# Patient Record
Sex: Male | Born: 1960 | Race: White | Hispanic: No | Marital: Married
Health system: Midwestern US, Community
[De-identification: ages and names within clinical notes are randomized; demographics above are authoritative.]

## PROBLEM LIST (undated history)

## (undated) DIAGNOSIS — I219 Acute myocardial infarction, unspecified: Secondary | ICD-10-CM

## (undated) DIAGNOSIS — I10 Essential (primary) hypertension: Secondary | ICD-10-CM

## (undated) DIAGNOSIS — I2510 Atherosclerotic heart disease of native coronary artery without angina pectoris: Secondary | ICD-10-CM

## (undated) DIAGNOSIS — E785 Hyperlipidemia, unspecified: Secondary | ICD-10-CM

## (undated) DIAGNOSIS — K219 Gastro-esophageal reflux disease without esophagitis: Secondary | ICD-10-CM

## (undated) DIAGNOSIS — G893 Neoplasm related pain (acute) (chronic): Secondary | ICD-10-CM

## (undated) DIAGNOSIS — G959 Disease of spinal cord, unspecified: Principal | ICD-10-CM

## (undated) DIAGNOSIS — K3189 Other diseases of stomach and duodenum: Secondary | ICD-10-CM

## (undated) DIAGNOSIS — C160 Malignant neoplasm of cardia: Principal | ICD-10-CM

## (undated) DIAGNOSIS — L7634 Postprocedural seroma of skin and subcutaneous tissue following other procedure: Secondary | ICD-10-CM

## (undated) DIAGNOSIS — R1012 Left upper quadrant pain: Secondary | ICD-10-CM

## (undated) HISTORY — DX: Hyperlipidemia, unspecified: E78.5

## (undated) HISTORY — PX: FRACTURE SURGERY: SHX138

## (undated) HISTORY — PX: CARDIAC CATHETERIZATION: SHX172

## (undated) HISTORY — DX: Essential (primary) hypertension: I10

## (undated) HISTORY — DX: Acute myocardial infarction, unspecified: I21.9

## (undated) HISTORY — DX: Atherosclerotic heart disease of native coronary artery without angina pectoris: I25.10

---

## 1982-06-09 HISTORY — PX: VASECTOMY: SHX75

## 2010-01-21 ENCOUNTER — Ambulatory Visit: Payer: Self-pay | Admitting: Cardiovascular Disease

## 2010-01-21 ENCOUNTER — Inpatient Hospital Stay: Payer: Self-pay | Admitting: Internal Medicine

## 2010-01-22 ENCOUNTER — Encounter: Payer: Self-pay | Admitting: Cardiovascular Disease

## 2010-01-23 ENCOUNTER — Encounter: Payer: Self-pay | Admitting: Cardiovascular Disease

## 2010-01-30 ENCOUNTER — Ambulatory Visit: Payer: Self-pay | Admitting: Cardiovascular Disease

## 2010-01-30 DIAGNOSIS — E785 Hyperlipidemia, unspecified: Secondary | ICD-10-CM | POA: Insufficient documentation

## 2010-01-30 DIAGNOSIS — I2510 Atherosclerotic heart disease of native coronary artery without angina pectoris: Secondary | ICD-10-CM | POA: Insufficient documentation

## 2010-03-06 ENCOUNTER — Encounter: Payer: Self-pay | Admitting: Cardiovascular Disease

## 2010-03-25 ENCOUNTER — Encounter: Payer: Self-pay | Admitting: Cardiovascular Disease

## 2010-07-09 NOTE — Letter (Signed)
Summary: [facility] - No-Show Fax  [facility] - No-Show Fax   Imported By: Marylou Mccoy 03/20/2010 10:47:01  _____________________________________________________________________  External Attachment:    Type:   Image     Comment:   External Document

## 2010-07-09 NOTE — Medication Information (Signed)
Summary: Tax adviser   Imported By: Harlon Flor 02/07/2010 11:52:23  _____________________________________________________________________  External Attachment:    Type:   Image     Comment:   External Document

## 2010-07-09 NOTE — Cardiovascular Report (Signed)
[facility]   Imported By: Harlon Flor 02/07/2010 11:46:43  _____________________________________________________________________  External Attachment:    Type:   Image     Comment:   External Document

## 2010-07-09 NOTE — Letter (Signed)
[facility]   Imported By: Harlon Flor 01/28/2010 10:38:07  _____________________________________________________________________  External Attachment:    Type:   Image     Comment:   External Document

## 2010-07-09 NOTE — Cardiovascular Report (Signed)
Summary: Intervention  Intervention   Imported By: Harlon Flor 02/07/2010 11:49:18  _____________________________________________________________________  External Attachment:    Type:   Image     Comment:   External Document

## 2010-07-09 NOTE — Assessment & Plan Note (Signed)
Summary: NP6/AMD   Visit Type:  Initial Consult  CC:  F/U [facility]. Denies chest pain or shortness of breath..  History of Present Illness: Erik Perkins is a very pleasant 50 year old gentleman with a long history of smoking who stopped several weeks ago, significant hyperlipidemia, hypertension who presented to [facility] on August 15 with stuttering worsening chest discomfort, troponin elevation 0.33, who had a cardiac catheterization showing severe 99% distal RCA disease. A stent was placed and he was discharged and presents for followup.  Overall he states that he is doing very well. He denies any further chest pain. He has been walking, riding his bike with no symptoms of shortness of breath. He's been tolerating his medications without any problems. No hypotension, lightheadedness, dizziness.  Cholesterol in the hospital showed total cholesterol greater than 250, LDL 170.  EKG shows normal sinus rhythm with a rate of 68 beats per minute, T-wave abnormality in lead III  Cardiac catheterization showed 99% distal RCA disease, moderate to severe proximal RCA disease estimated at 60-70%, mild diffuse LAD and left circumflex disease, moderate ostial D2 and D3 disease, moderate distal inferior wall hypokinesis on LV gram.  A Xience 3.5 x 12 mm DES stent was placed to his distal RCA.  Preventive Screening-Counseling & Management  Alcohol-Tobacco     Smoking Status: quit  Caffeine-Diet-Exercise     Does Patient Exercise: yes  Current Medications (verified): 1)  Effient 10 Mg Tabs (Prasugrel Hcl) .... Take One Tablet By Mouth Daily 2)  Crestor 40 Mg Tabs (Rosuvastatin Calcium) .... One Tablet At Bedtime 3)  Metoprolol Tartrate 25 Mg Tabs (Metoprolol Tartrate) .... One Tablet Two Times A Day 4)  Aspirin 325 Mg Tabs (Aspirin) .... One Tablet Once Daily 5)  Lisinopril 10 Mg Tabs (Lisinopril) .... One Tablet Once Daily  Allergies (verified): 1)  ! 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  Alcohol Use - no Regular Exercise - yes--3 times every week.  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  Alcohol Use - no Regular Exercise - yes--3 times every week. Smoking Status:  quit Does Patient Exercise:  yes  Review of Systems  The patient denies fever, weight loss, weight gain, vision loss, decreased hearing, hoarseness, chest pain, syncope, dyspnea on exertion, peripheral edema, prolonged cough, abdominal pain, incontinence, muscle weakness, depression, and enlarged lymph nodes.    Vital Signs:  Patient profile:   50 year old male Height:      71 inches Weight:      205 pounds BMI:     28.70 Pulse rate:   71 / minute BP sitting:   117 / 78  (left arm) Cuff size:   regular  Vitals Entered By: Bishop Dublin, CMA (January 30, 2010 3:13 PM)  Physical Exam  General:  Well developed, well nourished, in no acute distress. Head:  normocephalic and atraumatic Neck:  Neck supple, no JVD. No masses, thyromegaly or abnormal cervical nodes. Lungs:  Clear bilaterally to auscultation and percussion. Heart:  Non-displaced PMI, chest non-tender; regular rate  and rhythm, S1, S2 without murmurs, rubs or gallops. Carotid upstroke normal, no bruit.  Pedals normal pulses. No edema, no varicosities. Abdomen:  abdomen soft and non-tender without masses Msk:  Back normal, normal gait. Muscle strength and tone normal. Pulses:   pulses normal in all 4 extremities Extremities:  No clubbing or cyanosis. Neurologic:  Alert and oriented x 3. Skin:  Intact without lesions or rashes. Psych:  Normal affect.   Impression & Recommendations:  Problem # 1:  CAD (ICD-414.00) doing well after his distal RCA stent. He does have significant proximal RCA disease, moderate ostial diagonal disease. We will treat him aggressively and have encouraged watching his diet, weight loss, increased exercise. We will continue effient and aspirin 325 mg daily.  His updated medication list for this problem includes:    Effient 10 Mg Tabs (Prasugrel hcl) .Marland Kitchen... Take one tablet by mouth daily    Metoprolol Tartrate 25 Mg Tabs (Metoprolol tartrate) ..... One tablet two times a day    Aspirin 325 Mg Tabs (Aspirin) ..... One tablet once daily    Lisinopril 10 Mg Tabs (Lisinopril) ..... One tablet once daily  Orders: EKG w/ Interpretation (93000)  Problem # 2:  HYPERLIPIDEMIA-MIXED (ICD-272.4) We will change him from simvastatin 40 mg daily to  Crestor 40 mg daily given his LDL of 171. we will have him come in for a lipid check in 2-3 months Goal LDL is less than 70.  His updated medication list for this problem includes:    Crestor 40 Mg Tabs (Rosuvastatin calcium) ..... One tablet at bedtime  Patient Instructions: 1)  Your physician has recommended you make the following change in your medication:  Stop simvastatin and start taking Crestor 40 mg one tablet everyday at bedtime. 2)  Your physician wants you to follow-up in:  6 months  You will receive a reminder letter in the mail two months in advance. If you don't receive a letter, please call our office to schedule the follow-up appointment. Prescriptions: LISINOPRIL 10 MG TABS (LISINOPRIL) one tablet once daily  #90 x 6   Entered by:   Bishop Dublin, CMA   Authorized by:   Dossie Arbour MD   Signed by:   Bishop Dublin, CMA on 01/30/2010   Method used:   Electronically to        [id number]       [address]       [place], [place]  [address]       Ph: [phone]       Fax: [phone]   RxID:   [id number] METOPROLOL TARTRATE 25 MG TABS (METOPROLOL TARTRATE) one tablet two times a day  #180 x 12   Entered by:   Bishop Dublin, CMA   Authorized by:   Dossie Arbour MD   Signed by:   Bishop Dublin, CMA on 01/30/2010   Method used:   Electronically to        [id number]       [address]       [place], [place]  [address]       Ph: [phone]       Fax: [phone]   RxID:   [id number] EFFIENT 10 MG TABS (PRASUGREL HCL) Take one tablet by mouth daily  #30 x 12   Entered by:   Bishop Dublin, CMA   Authorized by:   Dossie Arbour MD   Signed by:   Bishop Dublin, CMA  on 01/30/2010   Method used:   Electronically to        [id number]       [address]       [place], [place]  [address]       Ph: [phone]       Fax: [phone]   RxID:   [id number] CRESTOR 40 MG TABS (ROSUVASTATIN CALCIUM) one tablet at bedtime  #90 x 3   Entered by:   Bishop Dublin, CMA   Authorized by:   Dossie Arbour MD   Signed by:   Bishop Dublin, CMA on 01/30/2010   Method used:   Electronically to        [id number]       [address]       [place], [place]  [address]       Ph: [phone]       Fax: [phone]   RxID:   [id number]

## 2010-07-09 NOTE — Miscellaneous (Signed)
Summary: Rx for effient and simvastatin  Clinical Lists Changes  Medications: Added new medication of EFFIENT 10 MG TABS (PRASUGREL HCL) Take one tablet by mouth daily - Signed Added new medication of SIMVASTATIN 40 MG TABS (SIMVASTATIN) onet tablet at bedtime - Signed Rx of EFFIENT 10 MG TABS (PRASUGREL HCL) Take one tablet by mouth daily;  #30 x 3;  Signed;  Entered by: Bishop Dublin, CMA;  Authorized by: Dossie Arbour MD;  Method used: Print then Give to Patient Rx of SIMVASTATIN 40 MG TABS (SIMVASTATIN) onet tablet at bedtime;  #30 x 3;  Signed;  Entered by: Bishop Dublin, CMA;  Authorized by: Dossie Arbour MD;  Method used: Print then Give to Patient    Prescriptions: SIMVASTATIN 40 MG TABS (SIMVASTATIN) onet tablet at bedtime  #30 x 3   Entered by:   Bishop Dublin, CMA   Authorized by:   Dossie Arbour MD   Signed by:   Bishop Dublin, CMA on 01/23/2010   Method used:   Print then Give to Patient   RxID:   [id number] EFFIENT 10 MG TABS (PRASUGREL HCL) Take one tablet by mouth daily  #30 x 3   Entered by:   Bishop Dublin, CMA   Authorized by:   Dossie Arbour MD   Signed by:   Bishop Dublin, CMA on 01/23/2010   Method used:   Print then Give to Patient   RxID:   [id number]

## 2010-07-09 NOTE — Letter (Signed)
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 [address]   [place], [place] [address]   Phone: [phone]  Fax: [phone]    01/23/2010  TO: Leodis Sias IT MAY CONCERN   RE: Erik Perkins [address] [id number]   The above named individual is under my medical care and may return to work on: August 22,2011 with no restrictions.   If you have any further questions or need additional information, please call.     Sincerely,      Dossie Arbour, MD

## 2010-07-09 NOTE — Letter (Signed)
Summary: PHI  PHI   Imported By: Harlon Flor 02/07/2010 11:51:51  _____________________________________________________________________  External Attachment:    Type:   Image     Comment:   External Document

## 2010-08-05 ENCOUNTER — Encounter: Payer: Self-pay | Admitting: Cardiovascular Disease

## 2010-08-05 ENCOUNTER — Ambulatory Visit (INDEPENDENT_AMBULATORY_CARE_PROVIDER_SITE_OTHER): Payer: Commercial Managed Care - PPO | Admitting: Cardiovascular Disease

## 2010-08-05 DIAGNOSIS — E785 Hyperlipidemia, unspecified: Secondary | ICD-10-CM

## 2010-08-05 DIAGNOSIS — Z9861 Coronary angioplasty status: Secondary | ICD-10-CM

## 2010-08-05 DIAGNOSIS — I2510 Atherosclerotic heart disease of native coronary artery without angina pectoris: Secondary | ICD-10-CM

## 2010-08-05 DIAGNOSIS — R942 Abnormal results of pulmonary function studies: Secondary | ICD-10-CM

## 2010-08-15 NOTE — Assessment & Plan Note (Signed)
Summary: F/U 6 MONTHS/SAB   Visit Type:  Follow-up Primary Provider:  [facility]   History of Present Illness: Erik Perkins is a very pleasant 50 year old gentleman with a long history of smoking, significant hyperlipidemia, hypertension who presented to [facility] on January 21 2010 with stuttering worsening chest discomfort, troponin elevation 0.33, who had a cardiac catheterization showing severe 99% distal RCA disease. A DES stent was placed . He presents for routine followup.  he states that he is doing very well. He denies any further chest pain. He has been walking, and active with no symptoms of shortness of breath. He's been tolerating his medications without any problems. No hypotension, lightheadedness, dizziness.  Most recent blood work from the [facility] 03/2010 shows total cholesterol 139, LDL 68, HDL 37, triglyceride 160, AST 59, ALT 106 ( this is up from AST 47 and ALT 83 in August of last year)  EKG shows normal sinus rhythm with a rate of 57 beats per minute with no significant ST-T wave changes  Cardiac catheterization showed 99% distal RCA disease, moderate to severe proximal RCA disease estimated at 60-70%, mild diffuse LAD and left circumflex disease, moderate ostial D2 and D3 disease, moderate distal inferior wall hypokinesis on LV gram.  A Xience 3.5 x 12 mm DES stent was placed to his distal RCA.  Current Medications (verified): 1)  Effient 10 Mg Tabs (Prasugrel Hcl) .... Take One Tablet By Mouth Daily 2)  Crestor 40 Mg Tabs (Rosuvastatin Calcium) .... One Tablet At Bedtime 3)  Metoprolol Tartrate 25 Mg Tabs (Metoprolol Tartrate) .... One Tablet Two Times A Day 4)  Aspirin 325 Mg Tabs (Aspirin) .... One Tablet Once Daily 5)  Lisinopril 10 Mg Tabs (Lisinopril) .... One Tablet Once Daily  Allergies (verified): 1)  ! 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  Alcohol Use - no Regular Exercise - yes--3 times every week.  Risk Factors: Exercise: yes (01/30/2010)  Risk Factors: Smoking Status: quit (01/30/2010)  Review of Systems  The patient denies fever, weight loss, weight gain, vision loss, decreased hearing, hoarseness, chest pain, syncope, dyspnea on exertion, peripheral edema, prolonged cough, abdominal pain, incontinence, muscle weakness, depression, and enlarged lymph nodes.    Vital Signs:  Patient profile:   50 year old male Height:      71 inches Weight:      214 pounds BMI:     29.95 Pulse rate:   57 / minute BP sitting:   117 / 81  (left arm) Cuff size:   regular  Vitals Entered By: Bishop Dublin, CMA (August 05, 2010 9:56 AM)  Physical Exam  General:  Well developed, well nourished, in no acute distress. Head:  normocephalic and atraumatic Neck:  Neck supple, no JVD. No masses, thyromegaly or abnormal cervical nodes. Lungs:  Clear bilaterally to auscultation and percussion. Heart:  Non-displaced PMI, chest non-tender; regular rate and rhythm, S1, S2 without murmurs, rubs or gallops. Carotid upstroke normal, no bruit.  Pedals normal pulses. No edema, no varicosities. Abdomen:  abdomen soft and non-tender without masses Msk:  Back normal, normal gait. Muscle strength and tone normal. Pulses:  pulses normal in all  4 extremities Extremities:  No clubbing or cyanosis. Neurologic:  Alert and oriented x 3. Skin:  Intact without lesions or rashes. Psych:  Normal affect.   Impression & Recommendations:  Problem # 1:  CAD (ICD-414.00) no symptoms of angina at this time. Continue aggressive  medical management. No further testing. We will decrease his aspirin from 325 mg daily to 81 mg x2. Continue effient  His updated medication list for this problem includes:    Effient 10 Mg Tabs (Prasugrel hcl) .Marland Kitchen... Take one tablet by mouth daily    Metoprolol Tartrate 25 Mg Tabs (Metoprolol tartrate) ..... One tablet two times a day    Aspirin 81 Mg Tbec (Aspirin) .Marland Kitchen... Take 2 tablet by mouth once daily.    Lisinopril 10 Mg Tabs (Lisinopril) ..... One tablet once daily    Nitrostat 0.4 Mg Subl (Nitroglycerin) .Marland Kitchen... 1 tablet under tongue at onset of chest pain; you may repeat every 5 minutes for up to 3 doses.  Problem # 2:  HYPERLIPIDEMIA-MIXED (ICD-272.4) cholesterol is at goal on his current medication regimen. We will need to closely watch his LFTs. He reports this will be done through the [facility] hospital.  His updated medication list for this problem includes:    Crestor 40 Mg Tabs (Rosuvastatin calcium) ..... One tablet at bedtime  Patient Instructions: 1)  Your physician recommends that you schedule a follow-up appointment in: 1 year 2)  Your physician has recommended you make the following change in your medication: DECREASE Aspirin to 81mg  2 tablets once daily. START Nitrostat 0.4 SL as needed. Prescriptions: NITROSTAT 0.4 MG SUBL (NITROGLYCERIN) 1 tablet under tongue at onset of chest pain; you may repeat every 5 minutes for up to 3 doses.  #25 x 3   Entered by:   Lanny Hurst RN   Authorized by:   Dossie Arbour MD   Signed by:   Lanny Hurst RN on 08/05/2010   Method used:   Electronically to        [id number]       [address]       [place], [place]  [address]       Ph: [phone]       Fax: [phone]   RxID:   [id number]

## 2011-02-17 ENCOUNTER — Other Ambulatory Visit: Payer: Self-pay | Admitting: *Deleted

## 2011-02-17 MED ORDER — ROSUVASTATIN CALCIUM 40 MG PO TABS: 40.0000 mg | ORAL_TABLET | Freq: Every day | ORAL | Status: DC

## 2011-02-24 ENCOUNTER — Telehealth: Payer: Self-pay

## 2011-02-24 MED ORDER — PRASUGREL HCL 10 MG PO TABS: 10.0000 mg | ORAL_TABLET | Freq: Every day | ORAL | Status: DC

## 2011-02-24 NOTE — Telephone Encounter (Signed)
Refill sent for effient 75 mg daily.

## 2011-03-06 ENCOUNTER — Telehealth: Payer: Self-pay

## 2011-03-06 MED ORDER — PRASUGREL HCL 10 MG PO TABS: 10.0000 mg | ORAL_TABLET | Freq: Every day | ORAL | Status: DC

## 2011-03-06 NOTE — Telephone Encounter (Signed)
Requested refill for effient

## 2011-06-16 LAB — METABOLIC PANEL, COMPREHENSIVE
A-G Ratio: 1 — ABNORMAL LOW (ref 1.2–3.5)
ALT (SGPT): 39 U/L (ref 12–65)
AST (SGOT): 21 U/L (ref 15–37)
Albumin: 4 g/dL (ref 3.5–5.0)
Alk. phosphatase: 90 U/L (ref 50–136)
Anion gap: 7 mmol/L (ref 7–16)
BUN: 15 MG/DL (ref 6–23)
Bilirubin, total: 0.5 MG/DL (ref 0.2–1.1)
CO2: 28 MMOL/L (ref 21–32)
Calcium: 8.8 MG/DL (ref 8.3–10.4)
Chloride: 104 MMOL/L (ref 98–107)
Creatinine: 1.2 MG/DL (ref 0.8–1.5)
GFR est AA: >60 mL/min/{1.73_m2} (ref >60)
GFR est non-AA: >60 mL/min/{1.73_m2} (ref >60)
Globulin: 3.9 g/dL — ABNORMAL HIGH (ref 2.3–3.5)
Glucose: 95 MG/DL (ref 65–100)
Potassium: 3.8 MMOL/L (ref 3.5–5.1)
Protein, total: 7.9 g/dL (ref 6.3–8.2)
Sodium: 139 MMOL/L (ref 136–145)

## 2011-06-17 LAB — PTT: aPTT: 28.5 s (ref 25.3–32.9)

## 2011-06-17 LAB — CBC W/O DIFF
HCT: 40.9 % — ABNORMAL LOW (ref 41.1–50.3)
HGB: 14.5 g/dL (ref 13.2–17.1)
MCH: 29.8 PG (ref 26.1–32.9)
MCHC: 35.5 g/dL — ABNORMAL HIGH (ref 31.4–35.0)
MCV: 84.2 FL (ref 79.6–97.8)
MPV: 10.1 FL — ABNORMAL LOW (ref 10.8–14.1)
PLATELET: 269 10*3/uL (ref 150–450)
RBC: 4.86 M/uL (ref 4.23–5.67)
RDW: 13.2 % (ref 11.9–14.6)
WBC: 8.9 10*3/uL (ref 4.3–11.1)

## 2011-06-17 LAB — PROTHROMBIN TIME + INR
INR: 1 (ref 0.9–1.2)
Prothrombin time: 10.7 s (ref 8.6–12.2)

## 2011-08-08 ENCOUNTER — Encounter: Payer: Self-pay | Admitting: Cardiovascular Disease

## 2011-08-08 ENCOUNTER — Ambulatory Visit (INDEPENDENT_AMBULATORY_CARE_PROVIDER_SITE_OTHER): Payer: Commercial Managed Care - PPO | Admitting: Cardiovascular Disease

## 2011-08-08 DIAGNOSIS — I2510 Atherosclerotic heart disease of native coronary artery without angina pectoris: Secondary | ICD-10-CM

## 2011-08-08 DIAGNOSIS — I10 Essential (primary) hypertension: Secondary | ICD-10-CM

## 2011-08-08 DIAGNOSIS — Z87891 Personal history of nicotine dependence: Secondary | ICD-10-CM | POA: Insufficient documentation

## 2011-08-08 DIAGNOSIS — E785 Hyperlipidemia, unspecified: Secondary | ICD-10-CM

## 2011-08-08 DIAGNOSIS — Z955 Presence of coronary angioplasty implant and graft: Secondary | ICD-10-CM

## 2011-08-08 DIAGNOSIS — Z9861 Coronary angioplasty status: Secondary | ICD-10-CM

## 2011-08-08 MED ORDER — CLOPIDOGREL BISULFATE 75 MG PO TABS: 75.0000 mg | ORAL_TABLET | Freq: Every day | ORAL | Status: DC

## 2011-08-08 MED ORDER — ROSUVASTATIN CALCIUM 40 MG PO TABS: 40.0000 mg | ORAL_TABLET | Freq: Every day | ORAL | Status: DC

## 2011-08-08 NOTE — Progress Notes (Signed)
Patient ID: Erik Perkins, male    DOB: 1960/07/12, 51 y.o.   MRN: [id number]  HPI Comments: Erik Perkins is a very pleasant 51 year old gentleman with a long history of smoking, significant hyperlipidemia, hypertension who presented to [facility] on January 21 2010 with stuttering worsening chest discomfort, troponin elevation 0.33, who had a cardiac catheterization showing severe 99% distal RCA disease. A DES stent was placed . He presents for routine followup.   he states that he is doing very well. He denies any further chest pain. He has been walking, and active with no symptoms of shortness of breath. He's been tolerating his medications without any problems. No hypotension, lightheadedness, dizziness.   Most recent blood work from the [facility] 01/2011 shows total cholesterol 120, LDL 60,  LFTs slightly improved   EKG shows normal sinus rhythm with a rate of 55 beats per minute with no significant ST-T wave changes   Cardiac catheterization showed 99% distal RCA disease, moderate to severe proximal RCA disease estimated at 60-70%, mild diffuse LAD and left circumflex disease, moderate ostial D2 and D3 disease, moderate distal inferior wall hypokinesis on LV gram. A Xience 3.5 x 12 mm DES stent was placed to his distal RCA.      Outpatient Encounter Prescriptions as of 08/08/2011  Medication Sig Dispense Refill  . aspirin 325 MG EC tablet Take 325 mg by mouth daily.       Marland Kitchen lisinopril (PRINIVIL,ZESTRIL) 10 MG tablet Take 10 mg by mouth daily.        . metoprolol tartrate (LOPRESSOR) 25 MG tablet Take 25 mg by mouth 2 (two) times daily.        Marland Kitchen OMEPRAZOLE PO Take 40 mg by mouth daily.       . rosuvastatin (CRESTOR) 40 MG tablet Take 1 tablet (40 mg total) by mouth daily.  90 tablet  3  . clopidogrel (PLAVIX) 75 MG tablet Take 1 tablet (75 mg total) by mouth daily.  30 tablet  11    Review of Systems  Constitutional: Negative.   HENT: Negative.   Eyes: Negative.   Respiratory: Negative.     Cardiovascular: Negative.   Gastrointestinal: Negative.   Musculoskeletal: Negative.   Skin: Negative.   Neurological: Negative.   Hematological: Negative.   Psychiatric/Behavioral: Negative.   All other systems reviewed and are negative.    BP 110/76  Pulse 55  Ht 5\' 11"  (1.803 m)  Wt 208 lb (94.348 kg)  BMI 29.01 kg/m2   Physical Exam  Nursing note and vitals reviewed. Constitutional: He is oriented to person, place, and time. He appears well-developed and well-nourished.  HENT:  Head: Normocephalic.  Nose: Nose normal.  Mouth/Throat: Oropharynx is clear and moist.  Eyes: Conjunctivae are normal. Pupils are equal, round, and reactive to light.  Neck: Normal range of motion. Neck supple. No JVD present.  Cardiovascular: Normal rate, regular rhythm, S1 normal, S2 normal, normal heart sounds and intact distal pulses.  Exam reveals no gallop and no friction rub.   No murmur heard. Pulmonary/Chest: Effort normal and breath sounds normal. No respiratory distress. He has no wheezes. He has no rales. He exhibits no tenderness.  Abdominal: Soft. Bowel sounds are normal. He exhibits no distension. There is no tenderness.  Musculoskeletal: Normal range of motion. He exhibits no edema and no tenderness.  Lymphadenopathy:    He has no cervical adenopathy.  Neurological: He is alert and oriented to person, place, and time. Coordination normal.  Skin: Skin  is warm and dry. No rash noted. No erythema.  Psychiatric: He has a normal mood and affect. His behavior is normal. Judgment and thought content normal.           Assessment and Plan

## 2011-08-08 NOTE — Assessment & Plan Note (Signed)
Cholesterol is at goal on the current lipid regimen. No changes to the medications were made.  

## 2011-08-08 NOTE — Assessment & Plan Note (Signed)
Currently with no symptoms of angina. No further workup at this time. Continue current medication regimen. 

## 2011-08-08 NOTE — Assessment & Plan Note (Signed)
He stopped smoking in 11/2009

## 2011-08-08 NOTE — Patient Instructions (Signed)
You are doing well. Please decrease aspirin to 81 mg daily  Stop effient Start plavix 75 mg daily  Please call us if you have new issues that need to be addressed before your next appt.  Your physician wants you to follow-up in: 12 months.  You will receive a reminder letter in the mail two months in advance. If you don't receive a letter, please call our office to schedule the follow-up appointment.

## 2011-08-26 ENCOUNTER — Other Ambulatory Visit: Payer: Self-pay | Admitting: *Deleted

## 2011-08-26 MED ORDER — ROSUVASTATIN CALCIUM 40 MG PO TABS: 40.0000 mg | ORAL_TABLET | Freq: Every day | ORAL | Status: DC

## 2012-01-07 ENCOUNTER — Telehealth: Payer: Self-pay | Admitting: Cardiovascular Disease

## 2012-01-07 ENCOUNTER — Ambulatory Visit (INDEPENDENT_AMBULATORY_CARE_PROVIDER_SITE_OTHER): Admitting: Cardiovascular Disease

## 2012-01-07 ENCOUNTER — Encounter: Payer: Self-pay | Admitting: Cardiovascular Disease

## 2012-01-07 VITALS — BP 108/72 | HR 46 | Ht 71.0 in | Wt 208.5 lb

## 2012-01-07 DIAGNOSIS — Z9861 Coronary angioplasty status: Secondary | ICD-10-CM

## 2012-01-07 DIAGNOSIS — E785 Hyperlipidemia, unspecified: Secondary | ICD-10-CM

## 2012-01-07 DIAGNOSIS — Z955 Presence of coronary angioplasty implant and graft: Secondary | ICD-10-CM

## 2012-01-07 DIAGNOSIS — R079 Chest pain, unspecified: Secondary | ICD-10-CM

## 2012-01-07 NOTE — Assessment & Plan Note (Signed)
Atypical type symptoms. We have suggested given his bradycardia we decreased his metoprolol in half, given symptoms of dizziness, weak had his lisinopril in half. If he does not have any improvement of his symptoms and continues to have dull chest pain at rest, we could order a stress test.

## 2012-01-07 NOTE — Patient Instructions (Addendum)
You are doing well. Please cut the metoprolol in 1/2 twice a daily Also cut the lisinopril in 1/2 and take 5 mg daily  If you continue to have symptoms of dull chest pain, call the office for a stress test  Please call us if you have new issues that need to be addressed before your next appt.  Your physician wants you to follow-up in: 6 months.  You will receive a reminder letter in the mail two months in advance. If you don't receive a letter, please call our office to schedule the follow-up appointment.

## 2012-01-07 NOTE — Assessment & Plan Note (Signed)
Cholesterol is at goal on the current lipid regimen. No changes to the medications were made.  

## 2012-01-07 NOTE — Progress Notes (Signed)
Patient ID: Erik Perkins, male    DOB: May 13, 1961, 51 y.o.   MRN: [id number]  HPI Comments: Erik Perkins is a very pleasant 51 year old gentleman with a long history of smoking, significant hyperlipidemia, hypertension who presented to 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 A DES stent was placed . He presents for routine followup.   He reports that he has had some dull chest pain in the past week or so. Comes and goes, comes on at rest, not with exertion. The previous week he had left arm numbness and mild discomfort, nonexertional. No further episodes of left arm pain. He has been push mowing, maintaining his house, weedeating with no symptoms of chest pain or arm pain. No shortness of breath or lightheadedness or sweating. Some dizziness with standing, some fatigue  Recent blood work shows total cholesterol 120, LDL 60 in August 2012.    EKG shows normal sinus rhythm with a rate of 46 beats per minute with no significant ST-T wave changes   Cardiac catheterization showed 99% distal RCA disease, moderate to severe proximal RCA disease estimated at 60-70%, mild diffuse LAD and left circumflex disease, moderate ostial D2 and D3 disease, moderate distal inferior wall hypokinesis on LV gram. A Xience 3.5 x 12 mm DES stent was placed to his distal RCA.      Outpatient Encounter Prescriptions as of 01/07/2012  Medication Sig Dispense Refill  . aspirin 81 MG tablet Take 81 mg by mouth daily.      . clopidogrel (PLAVIX) 75 MG tablet Take 1 tablet (75 mg total) by mouth daily.  30 tablet  11  . lisinopril (PRINIVIL,ZESTRIL) 10 MG tablet Take 0.5 tablets (5 mg total) by mouth daily.  45 tablet  3  . metoprolol tartrate (LOPRESSOR) 25 MG tablet Take 0.5 tablets (12.5 mg total) by mouth 2 (two) times daily.  90 tablet  3  . OMEPRAZOLE PO Take 40 mg by mouth daily.       .  rosuvastatin (CRESTOR) 40 MG tablet Take 1 tablet (40 mg total) by mouth daily.  90 tablet  3    Review of Systems  Constitutional: Negative.   HENT: Negative.   Eyes: Negative.   Respiratory: Positive for chest tightness.   Cardiovascular: Negative.   Gastrointestinal: Negative.   Musculoskeletal: Negative.        Left arm pain  Skin: Negative.   Neurological: Negative.   Hematological: Negative.   Psychiatric/Behavioral: Negative.   All other systems reviewed and are negative.    BP 108/72  Pulse 46  Ht 5\' 11"  (1.803 m)  Wt 208 lb 8 oz (94.575 kg)  BMI 29.08 kg/m2  Physical Exam  Nursing note and vitals reviewed. Constitutional: He is oriented to person, place, and time. He appears well-developed and well-nourished.  HENT:  Head: Normocephalic.  Nose: Nose normal.  Mouth/Throat: Oropharynx is clear and moist.  Eyes: Conjunctivae are normal. Pupils are equal, round, and reactive to light.  Neck: Normal range of motion. Neck supple. No JVD present.  Cardiovascular: Normal rate, regular rhythm, S1 normal, S2 normal, normal heart sounds and intact distal pulses.  Exam reveals no gallop and no friction rub.   No murmur heard. Pulmonary/Chest: Effort normal and breath sounds normal. No respiratory distress. He has no wheezes. He has no rales. He exhibits no tenderness.  Abdominal: Soft. Bowel sounds are normal. He exhibits no  distension. There is no tenderness.  Musculoskeletal: Normal range of motion. He exhibits no edema and no tenderness.  Lymphadenopathy:    He has no cervical adenopathy.  Neurological: He is alert and oriented to person, place, and time. Coordination normal.  Skin: Skin is warm and dry. No rash noted. No erythema.  Psychiatric: He has a normal mood and affect. His behavior is normal. Judgment and thought content normal.           Assessment and Plan

## 2012-01-07 NOTE — Telephone Encounter (Signed)
Pt wife called stating that he had some CP and weakness in his left arm about a week ago. Wants to know what they should do

## 2012-01-07 NOTE — Telephone Encounter (Signed)
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 He was worked in with Dr. Mariah Milling this am at 1030

## 2012-07-23 ENCOUNTER — Other Ambulatory Visit: Payer: Self-pay | Admitting: Cardiovascular Disease

## 2012-08-10 ENCOUNTER — Other Ambulatory Visit: Payer: Self-pay | Admitting: Cardiovascular Disease

## 2012-08-10 NOTE — Telephone Encounter (Signed)
Refilled Clopidogrel sent to [facility].

## 2012-08-18 ENCOUNTER — Ambulatory Visit: Admitting: Cardiovascular Disease

## 2012-09-08 ENCOUNTER — Ambulatory Visit (INDEPENDENT_AMBULATORY_CARE_PROVIDER_SITE_OTHER): Payer: Commercial Managed Care - PPO | Admitting: Cardiovascular Disease

## 2012-09-08 ENCOUNTER — Encounter: Payer: Self-pay | Admitting: Cardiovascular Disease

## 2012-09-08 VITALS — BP 118/75 | HR 61 | Ht 71.0 in | Wt 218.5 lb

## 2012-09-08 DIAGNOSIS — Z955 Presence of coronary angioplasty implant and graft: Secondary | ICD-10-CM

## 2012-09-08 DIAGNOSIS — Z9861 Coronary angioplasty status: Secondary | ICD-10-CM

## 2012-09-08 DIAGNOSIS — I2510 Atherosclerotic heart disease of native coronary artery without angina pectoris: Secondary | ICD-10-CM

## 2012-09-08 DIAGNOSIS — E785 Hyperlipidemia, unspecified: Secondary | ICD-10-CM

## 2012-09-08 MED ORDER — NITROGLYCERIN 0.4 MG SL SUBL: 0.4000 mg | SUBLINGUAL_TABLET | SUBLINGUAL | Status: DC | PRN

## 2012-09-08 NOTE — Assessment & Plan Note (Signed)
We have recommended he stay on his cholesterol medication.he is scheduled to have lab work at the [facility]. Goal LDL less than 70

## 2012-09-08 NOTE — Assessment & Plan Note (Signed)
Currently with no symptoms of angina. No further workup at this time. Continue current medication regimen. 

## 2012-09-08 NOTE — Progress Notes (Signed)
Patient ID: Erik Perkins, male    DOB: [birth year], 52 y.o.   MRN: [id number]  HPI Comments: Mr. 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 A DES stent was placed . He presents for routine followup.   He denies any recent chest pain. His weight is up 10 pounds or so from last year. He has not been working out as much. Otherwise he feels well. Tolerating medications well.  Of shortness of breath, edema, lightheadedness.  Recent blood work shows total cholesterol 120, LDL 60 in August 2012.    EKG shows normal sinus rhythm with a rate of 61 beats per minute with no significant ST-T wave changes   Cardiac catheterization showed 99% distal RCA disease, moderate to severe proximal RCA disease estimated at 60-70%, mild diffuse LAD and left circumflex disease, moderate ostial D2 and D3 disease, moderate distal inferior wall hypokinesis on LV gram. A Xience 3.5 x 12 mm DES stent was placed to his distal RCA.      Outpatient Encounter Prescriptions as of 09/08/2012  Medication Sig Dispense Refill  . aspirin 81 MG tablet Take 81 mg by mouth daily.      . clopidogrel (PLAVIX) 75 MG tablet TAKE 1 TABLET BY MOUTH EVERY DAY  30 tablet  3  . CRESTOR 40 MG tablet TAKE 1 TABLET BY MOUTH EVERY DAY  90 tablet  3  . lisinopril (PRINIVIL,ZESTRIL) 10 MG tablet Take 0.5 tablets (5 mg total) by mouth daily.  45 tablet  3  . metoprolol tartrate (LOPRESSOR) 25 MG tablet Take 0.5 tablets (12.5 mg total) by mouth 2 (two) times daily.  90 tablet  3  . OMEPRAZOLE PO Take 40 mg by mouth daily.       . nitroGLYCERIN (NITROSTAT) 0.4 MG SL tablet Place 1 tablet (0.4 mg total) under the tongue every 5 (five) minutes as needed for chest pain.  25 tablet  6   Review of  Systems  Constitutional: Negative.   HENT: Negative.   Eyes: Negative.   Cardiovascular: Negative.   Gastrointestinal: Negative.   Musculoskeletal: Negative.   Skin: Negative.   Neurological: Negative.   Psychiatric/Behavioral: Negative.   All other systems reviewed and are negative.   BP 118/75  Pulse 61  Ht 5\' 11"  (1.803 m)  Wt 218 lb 8 oz (99.111 kg)  BMI 30.49 kg/m2  Physical Exam  Nursing note and vitals reviewed. Constitutional: He is oriented to person, place, and time. He appears well-developed and well-nourished.  HENT:  Head: Normocephalic.  Nose: Nose normal.  Mouth/Throat: Oropharynx is clear and moist.  Eyes: Conjunctivae are normal. Pupils are equal, round, and reactive to light.  Neck: Normal range of motion. Neck supple. No JVD present.  Cardiovascular: Normal rate, regular rhythm, S1 normal, S2 normal, normal heart sounds and intact distal pulses.  Exam reveals no gallop and no friction rub.   No murmur heard. Pulmonary/Chest: Effort normal and breath sounds normal. No respiratory distress. He has no wheezes. He has no rales. He exhibits no tenderness.  Abdominal: Soft. Bowel sounds are normal. He exhibits no distension. There is no tenderness.  Musculoskeletal: Normal range of motion. He exhibits no edema and no tenderness.  Lymphadenopathy:    He has no cervical adenopathy.  Neurological: He is alert and  oriented to person, place, and time. Coordination normal.  Skin: Skin is warm and dry. No rash noted. No erythema.  Psychiatric: He has a normal mood and affect. His behavior is normal. Judgment and thought content normal.      Assessment and Plan

## 2012-09-08 NOTE — Patient Instructions (Addendum)
You are doing well. No medication changes were made.  Goal total cholesterol is <150, LDL <70  Please call us if you have new issues that need to be addressed before your next appt.  Your physician wants you to follow-up in: 12 months.  You will receive a reminder letter in the mail two months in advance. If you don't receive a letter, please call our office to schedule the follow-up appointment.

## 2012-09-08 NOTE — Assessment & Plan Note (Signed)
Continue on aspirin and Plavix

## 2012-11-19 ENCOUNTER — Other Ambulatory Visit: Payer: Self-pay | Admitting: *Deleted

## 2012-11-19 MED ORDER — CLOPIDOGREL BISULFATE 75 MG PO TABS: ORAL_TABLET | ORAL | Status: DC

## 2012-11-19 NOTE — Telephone Encounter (Signed)
Refilled [facility] 

## 2013-03-14 ENCOUNTER — Ambulatory Visit (INDEPENDENT_AMBULATORY_CARE_PROVIDER_SITE_OTHER): Payer: Commercial Managed Care - PPO | Admitting: Physician Assistant

## 2013-03-14 ENCOUNTER — Encounter: Payer: Self-pay | Admitting: Physician Assistant

## 2013-03-14 VITALS — BP 114/76 | HR 56 | Temp 98.0°F | Resp 18 | Ht 69.0 in | Wt 213.0 lb

## 2013-03-14 DIAGNOSIS — L259 Unspecified contact dermatitis, unspecified cause: Secondary | ICD-10-CM

## 2013-03-14 DIAGNOSIS — L239 Allergic contact dermatitis, unspecified cause: Secondary | ICD-10-CM

## 2013-03-14 MED ORDER — PREDNISONE 20 MG PO TABS: ORAL_TABLET | ORAL | Status: DC

## 2013-03-14 NOTE — Progress Notes (Signed)
Patient ID: Erik Perkins MRN: [id number], DOB: Jun 08, 1961, 52 y.o. Date of Encounter: 03/14/2013, 12:21 PM    Chief Complaint:  Chief Complaint  Patient presents with  . new pt est care    rash on arm,hip, leg     HPI: 52 y.o. year old white male is here as a new patient to our office today. Says that his wife already comes to this office for her primary care. Says that he will plan to continue to come here for primary care. Says that he currently gets his medical care at the [facility] and also goes to [facility].  Says that this rash first started about 2 or 3 weeks ago. Had linear pink rash on legs and arms. Applied some poison poison ivy spray to the area on the left forearm. Developed diffuse rash on the arm after that. Has continued to have splotches of itchy erythema on arms legs and left upper thigh and left lower abdomen.     Home Meds: See attached medication section for any medications that were entered at today's visit. The computer does not put those onto this list.The following list is a list of meds entered prior to today's visit.   Current Outpatient Prescriptions on File Prior to Visit  Medication Sig Dispense Refill  . aspirin 81 MG tablet Take 81 mg by mouth daily.      . clopidogrel (PLAVIX) 75 MG tablet TAKE 1 TABLET BY MOUTH EVERY DAY  30 tablet  5  . CRESTOR 40 MG tablet TAKE 1 TABLET BY MOUTH EVERY DAY  90 tablet  3  . lisinopril (PRINIVIL,ZESTRIL) 10 MG tablet Take 0.5 tablets (5 mg total) by mouth daily.  45 tablet  3  . metoprolol tartrate (LOPRESSOR) 25 MG tablet Take 0.5 tablets (12.5 mg total) by mouth 2 (two) times daily.  90 tablet  3  . nitroGLYCERIN (NITROSTAT) 0.4 MG SL tablet Place 1 tablet (0.4 mg total) under the tongue every 5 (five) minutes as needed for chest pain.  25 tablet  6  . OMEPRAZOLE PO Take 40 mg by mouth daily.        No current facility-administered medications on file prior to visit.    Allergies:  Allergies  Allergen  Reactions  . Other Rash    Cephadrine      Review of Systems: See HPI for pertinent ROS. All other ROS negative.    Physical Exam: Blood pressure 114/76, pulse 56, temperature 98 F (36.7 C), temperature source Oral, resp. rate 18, height 5\' 9"  (1.753 m), weight 213 lb (96.616 kg)., Body mass index is 31.44 kg/(m^2). General:Well Nourished, well-developed white male .Appears in no acute distress. Lungs: Clear bilaterally to auscultation without wheezes, rales, or rhonchi. Breathing is unlabored. Heart: Regular rhythm. No murmurs, rubs, or gallops. Msk:  Strength and tone normal for age. Extremities/Skin: Warm and dry. Linear erythema on left lower leg. Some linear erythema on the right upper arm. Left arm with diffuse pink erythema over the entire forearm. Left lower abdomen with some splotchy erythema. Neuro: Alert and oriented X 3. Moves all extremities spontaneously. Gait is normal. CNII-XII grossly in tact. Psych:  Responds to questions appropriately with a normal affect.     ASSESSMENT AND PLAN:  52 y.o. year old male with  1. Allergic dermatitis - predniSONE (DELTASONE) 20 MG tablet; Take 3 daily for 2 days, then 2 daily for 2 days, then 1 daily for 2 days.  Dispense: 12 tablet; Refill: 0 Followup  if does not resolve. Cautioned regarding side effects of prednisone. Do not use that type of spray in the future as he is apparently "allergic to this product.  [address] [place], [place], [facility] 03/14/2013 12:21 PM

## 2013-05-04 ENCOUNTER — Encounter: Payer: Self-pay | Admitting: Family Medicine

## 2013-06-04 ENCOUNTER — Other Ambulatory Visit: Payer: Self-pay | Admitting: Cardiovascular Disease

## 2013-06-06 ENCOUNTER — Other Ambulatory Visit: Payer: Self-pay

## 2013-08-03 ENCOUNTER — Other Ambulatory Visit: Payer: Self-pay | Admitting: Cardiovascular Disease

## 2013-11-09 ENCOUNTER — Other Ambulatory Visit: Payer: Self-pay | Admitting: Cardiovascular Disease

## 2013-11-22 ENCOUNTER — Ambulatory Visit (INDEPENDENT_AMBULATORY_CARE_PROVIDER_SITE_OTHER): Payer: Commercial Managed Care - PPO | Admitting: Cardiovascular Disease

## 2013-11-22 ENCOUNTER — Encounter: Payer: Self-pay | Admitting: Cardiovascular Disease

## 2013-11-22 VITALS — BP 120/90 | HR 64 | Ht 71.0 in | Wt 217.2 lb

## 2013-11-22 DIAGNOSIS — E785 Hyperlipidemia, unspecified: Secondary | ICD-10-CM

## 2013-11-22 DIAGNOSIS — I2510 Atherosclerotic heart disease of native coronary artery without angina pectoris: Secondary | ICD-10-CM

## 2013-11-22 DIAGNOSIS — Z9861 Coronary angioplasty status: Secondary | ICD-10-CM

## 2013-11-22 DIAGNOSIS — Z955 Presence of coronary angioplasty implant and graft: Secondary | ICD-10-CM

## 2013-11-22 MED ORDER — METOPROLOL TARTRATE 25 MG PO TABS: 25.0000 mg | ORAL_TABLET | Freq: Two times a day (BID) | ORAL | Status: DC

## 2013-11-22 MED ORDER — LISINOPRIL 10 MG PO TABS: 10.0000 mg | ORAL_TABLET | Freq: Every day | ORAL | Status: DC

## 2013-11-22 NOTE — Assessment & Plan Note (Signed)
Currently with no symptoms of angina. No further workup at this time. Continue current medication regimen. 

## 2013-11-22 NOTE — Progress Notes (Signed)
Patient ID: Erik PigeonRobert Perkins, male    DOB: 07/02/1960, 53 y.o.   MRN: [id number]  HPI Comments: 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 A DES stent was placed . He presents for routine followup.   He denies any recent chest pain. He is trying to watch his diet. doing some exercise. Denies any significant shortness of breath..  He has not been working out as much. Otherwise he feels well. Tolerating medications well.    he ran out of his medications recently . [facility], a new primary care physician  blood work shows total cholesterol 120, LDL 60 in August 2012.    EKG shows normal sinus rhythm with a rate of 64 beats per minute with no significant ST-T wave changes   Cardiac catheterization showed 99% distal RCA disease, moderate to severe proximal RCA disease estimated at 60-70%, mild diffuse LAD and left circumflex disease, moderate ostial D2 and D3 disease, moderate distal inferior wall hypokinesis on LV gram. A Xience 3.5 x 12 mm DES stent was placed to his distal RCA.      Outpatient Encounter Prescriptions as of 11/22/2013  Medication Sig  . aspirin 81 MG tablet Take 81 mg by mouth daily.  . clopidogrel (PLAVIX) 75 MG tablet TAKE 1 TABLET BY MOUTH EVERY DAY  . CRESTOR 40 MG tablet TAKE 1 TABLET BY MOUTH EVERY DAY  . lisinopril (PRINIVIL,ZESTRIL) 10 MG tablet Take 0.5 tablets (5 mg total) by mouth daily.  . metoprolol tartrate (LOPRESSOR) 25 MG tablet Take 0.5 tablets (12.5 mg total) by mouth 2 (two) times daily.  . nitroGLYCERIN (NITROSTAT) 0.4 MG SL tablet Place 1 tablet (0.4 mg total) under the tongue every 5 (five) minutes as needed for chest pain.  Marland Kitchen. OMEPRAZOLE PO Take 40 mg by mouth daily.    . [DISCONTINUED] predniSONE (DELTASONE) 20 MG tablet Take 3 daily for 2 days, then 2 daily for 2 days, then 1 daily for 2 days.     Review of Systems  Constitutional: Negative.   HENT: Negative.   Eyes: Negative.   Respiratory: Negative.   Cardiovascular: Negative.   Gastrointestinal: Negative.   Endocrine: Negative.   Musculoskeletal: Negative.   Skin: Negative.   Allergic/Immunologic: Negative.   Neurological: Negative.   Hematological: Negative.   Psychiatric/Behavioral: Negative.   All other systems reviewed and are negative.  BP 120/90  Pulse 64  Ht 5\' 11"  (1.803 m)  Wt 217 lb 4 oz (98.544 kg)  BMI 30.31 kg/m2  Physical Exam  Nursing note and vitals reviewed. Constitutional: He is oriented to person, place, and time. He appears well-developed and well-nourished.  HENT:  Head: Normocephalic.  Nose: Nose normal.  Mouth/Throat: Oropharynx is clear and moist.  Eyes: Conjunctivae are normal. Pupils are equal, round, and reactive to light.  Neck: Normal range of motion. Neck supple. No JVD present.  Cardiovascular: Normal rate, regular rhythm, S1 normal, S2 normal, normal heart sounds and intact distal pulses.  Exam reveals no gallop and no friction rub.   No murmur heard. Pulmonary/Chest: Effort normal and breath sounds normal. No respiratory distress. He has no wheezes. He has no rales. He exhibits no tenderness.  Abdominal: Soft. Bowel sounds are normal. He exhibits no distension. There is  no tenderness.  Musculoskeletal: Normal range of motion. He exhibits no edema and no tenderness.  Lymphadenopathy:    He has no cervical adenopathy.  Neurological: He is alert and oriented to person, place, and time. Coordination normal.  Skin: Skin is warm and dry. No rash noted. No erythema.  Psychiatric: He has a normal mood and affect. His behavior is normal. Judgment and thought content normal.      Assessment and Plan

## 2013-11-22 NOTE — Assessment & Plan Note (Signed)
Prior stent to his RCA. Currently with no symptoms of angina

## 2013-11-22 NOTE — Patient Instructions (Signed)
You are doing well. No medication changes were made.  Goal total cholesterol <150 Goal LDL <70  Please call us if you have new issues that need to be addressed before your next appt.  Your physician wants you to follow-up in: 12 months.  You will receive a reminder letter in the mail two months in advance. If you don't receive a letter, please call our office to schedule the follow-up appointment.

## 2013-11-22 NOTE — Assessment & Plan Note (Signed)
Cholesterol is at goal on the current lipid regimen. No changes to the medications were made. Followed [facility]

## 2014-01-22 ENCOUNTER — Other Ambulatory Visit: Payer: Self-pay | Admitting: Cardiovascular Disease

## 2014-04-11 ENCOUNTER — Other Ambulatory Visit: Payer: Self-pay | Admitting: *Deleted

## 2014-04-11 MED ORDER — ROSUVASTATIN CALCIUM 40 MG PO TABS: ORAL_TABLET | ORAL | Status: DC

## 2014-05-10 ENCOUNTER — Other Ambulatory Visit: Payer: Self-pay | Admitting: Cardiovascular Disease

## 2014-06-25 ENCOUNTER — Other Ambulatory Visit: Payer: Self-pay | Admitting: Cardiovascular Disease

## 2014-09-26 ENCOUNTER — Other Ambulatory Visit: Payer: Self-pay | Admitting: Cardiovascular Disease

## 2014-10-27 ENCOUNTER — Other Ambulatory Visit: Payer: Self-pay | Admitting: Cardiovascular Disease

## 2014-12-04 ENCOUNTER — Encounter: Payer: Self-pay | Admitting: Cardiovascular Disease

## 2014-12-04 ENCOUNTER — Ambulatory Visit (INDEPENDENT_AMBULATORY_CARE_PROVIDER_SITE_OTHER): Payer: Commercial Managed Care - PPO | Admitting: Cardiovascular Disease

## 2014-12-04 VITALS — BP 106/80 | HR 67 | Ht 71.0 in | Wt 215.5 lb

## 2014-12-04 DIAGNOSIS — E785 Hyperlipidemia, unspecified: Secondary | ICD-10-CM

## 2014-12-04 DIAGNOSIS — I25119 Atherosclerotic heart disease of native coronary artery with unspecified angina pectoris: Secondary | ICD-10-CM | POA: Diagnosis not present

## 2014-12-04 DIAGNOSIS — Z955 Presence of coronary angioplasty implant and graft: Secondary | ICD-10-CM

## 2014-12-04 NOTE — Assessment & Plan Note (Signed)
Currently with no symptoms of angina. No further workup at this time. Continue current medication regimen. 

## 2014-12-04 NOTE — Assessment & Plan Note (Signed)
Cholesterol is at goal on the current lipid regimen. No changes to the medications were made.  

## 2014-12-04 NOTE — Patient Instructions (Signed)
You are doing well. No medication changes were made.  Please call us if you have new issues that need to be addressed before your next appt.  Your physician wants you to follow-up in: 12 months.  You will receive a reminder letter in the mail two months in advance. If you don't receive a letter, please call our office to schedule the follow-up appointment. 

## 2014-12-04 NOTE — Assessment & Plan Note (Signed)
Recommended he stay on his aspirin and Plavix 

## 2014-12-04 NOTE — Progress Notes (Signed)
Patient ID: Erik Perkins, male    DOB: [birth year], 54 y.o.   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 A DES stent was placed . He presents for routine followup of his coronary artery disease    He denies any recent chest pain. He is working with a Systems analyst 3 days per week, does lots of physical activity at home on his property Denies any significant shortness of breath..  Tolerating medications well.    Most recent lab work from the [facility] were reviewed with him showing total cholesterol 131, LDL 60, hemoglobin A1c 5.6. He reports that he was not fasting for this lab work   EKG shows normal sinus rhythm with a rate of 67 beats per minute with no significant ST-T wave changes   Cardiac catheterization showed 99% distal RCA disease, moderate to severe proximal RCA disease estimated at 60-70%, mild diffuse LAD and left circumflex disease, moderate ostial D2 and D3 disease, moderate distal inferior wall hypokinesis on LV gram. A Xience 3.5 x 12 mm DES stent was placed to his distal RCA.     Allergies  Allergen Reactions  . Other Rash    Cephadrine    Current Outpatient Prescriptions on File Prior to Visit  Medication Sig Dispense Refill  . aspirin 81 MG tablet Take 81 mg by mouth daily.    . clopidogrel (PLAVIX) 75 MG tablet TAKE 1 TABLET BY MOUTH EVERY DAY 30 tablet 6  . nitroGLYCERIN (NITROSTAT) 0.4 MG SL tablet Place 1 tablet (0.4 mg total) under the tongue every 5 (five) minutes as needed for chest pain. 25 tablet 6  . OMEPRAZOLE PO Take 40 mg by mouth daily.     . rosuvastatin (CRESTOR) 40 MG tablet TAKE 1 TABLET BY MOUTH EVERY DAY 90 tablet 3   No current facility-administered  medications on file prior to visit.    Past Medical History  Diagnosis Date  . Coronary artery disease   . Hyperlipidemia   . Hypertension   . MI (myocardial infarction)     10/2004, 01/2010    Past Surgical History  Procedure Laterality Date  . Cardiac catheterization    . Fracture surgery Right     wrist  54yo  . Vasectomy  52    Social History  reports that he quit smoking about 4 years ago. He has never used smokeless tobacco. He reports that he drinks about 2.2 oz of alcohol per week. He reports that he does not use illicit drugs.  Family History family history includes Cancer in his father; Coronary artery disease in his sister; Diabetes in his sister; Hyperlipidemia in his father and sister.   Review of Systems  Constitutional: Negative.   Respiratory: Negative.   Cardiovascular: Negative.   Gastrointestinal: Negative.   Musculoskeletal: Negative.   Neurological: Negative.   Hematological: Negative.   Psychiatric/Behavioral: Negative.   All other systems reviewed and are negative.  BP 106/80 mmHg  Pulse 67  Ht  (1.803 m)  Wt 215 lb 8 oz (97.75 kg)  BMI 30.07 kg/m2  Physical Exam  Constitutional: He is oriented to person, place, and time. He appears well-developed and well-nourished.  HENT:  Head: Normocephalic.  Nose: Nose normal.  Mouth/Throat: Oropharynx is clear  and moist.  Eyes: Conjunctivae are normal. Pupils are equal, round, and reactive to light.  Neck: Normal range of motion. Neck supple. No JVD present.  Cardiovascular: Normal rate, regular rhythm, S1 normal, S2 normal, normal heart sounds and intact distal pulses.  Exam reveals no gallop and no friction rub.   No murmur heard. Pulmonary/Chest: Effort normal and breath sounds normal. No respiratory distress. He has no wheezes. He has no rales. He exhibits no tenderness.  Abdominal: Soft. Bowel sounds are normal. He exhibits no distension. There is no tenderness.  Musculoskeletal: Normal  range of motion. He exhibits no edema or tenderness.  Lymphadenopathy:    He has no cervical adenopathy.  Neurological: He is alert and oriented to person, place, and time. Coordination normal.  Skin: Skin is warm and dry. No rash noted. No erythema.  Psychiatric: He has a normal mood and affect. His behavior is normal. Judgment and thought content normal.      Assessment and Plan   Nursing note and vitals reviewed.

## 2015-03-03 ENCOUNTER — Other Ambulatory Visit: Payer: Self-pay | Admitting: Cardiovascular Disease

## 2015-04-11 ENCOUNTER — Other Ambulatory Visit: Payer: Self-pay | Admitting: *Deleted

## 2015-04-11 MED ORDER — ROSUVASTATIN CALCIUM 40 MG PO TABS: ORAL_TABLET | ORAL | Status: DC

## 2015-05-03 ENCOUNTER — Other Ambulatory Visit: Payer: Self-pay | Admitting: Cardiovascular Disease

## 2015-08-01 ENCOUNTER — Other Ambulatory Visit: Payer: Self-pay | Admitting: Cardiovascular Disease

## 2015-09-22 ENCOUNTER — Other Ambulatory Visit: Payer: Self-pay | Admitting: Cardiovascular Disease

## 2015-11-20 ENCOUNTER — Other Ambulatory Visit: Payer: Self-pay | Admitting: Cardiovascular Disease

## 2015-11-25 ENCOUNTER — Other Ambulatory Visit: Payer: Self-pay | Admitting: Cardiovascular Disease

## 2015-12-05 ENCOUNTER — Encounter: Payer: Self-pay | Admitting: Cardiovascular Disease

## 2015-12-05 ENCOUNTER — Ambulatory Visit (INDEPENDENT_AMBULATORY_CARE_PROVIDER_SITE_OTHER): Payer: Commercial Managed Care - PPO | Admitting: Cardiovascular Disease

## 2015-12-05 VITALS — BP 120/80 | HR 61 | Ht 71.0 in | Wt 215.0 lb

## 2015-12-05 DIAGNOSIS — Z720 Tobacco use: Secondary | ICD-10-CM | POA: Diagnosis not present

## 2015-12-05 DIAGNOSIS — I159 Secondary hypertension, unspecified: Secondary | ICD-10-CM

## 2015-12-05 DIAGNOSIS — E785 Hyperlipidemia, unspecified: Secondary | ICD-10-CM | POA: Diagnosis not present

## 2015-12-05 DIAGNOSIS — I25119 Atherosclerotic heart disease of native coronary artery with unspecified angina pectoris: Secondary | ICD-10-CM

## 2015-12-05 DIAGNOSIS — Z87891 Personal history of nicotine dependence: Secondary | ICD-10-CM

## 2015-12-05 MED ORDER — NITROGLYCERIN 0.4 MG SL SUBL: 0.4000 mg | SUBLINGUAL_TABLET | SUBLINGUAL | Status: DC | PRN

## 2015-12-05 NOTE — Patient Instructions (Signed)
Medication Instructions:   No changes   Follow-Up: It was a pleasure seeing you in the office today. Please call us if you have new issues that need to be addressed before your next appt.  [phone]  Your physician wants you to follow-up in: 12 months.  You will receive a reminder letter in the mail two months in advance. If you don't receive a letter, please call our office to schedule the follow-up appointment.  If you need a refill on your cardiac medications before your next appointment, please call your pharmacy.     

## 2015-12-05 NOTE — Progress Notes (Signed)
Patient ID: Erik PigeonRobert Perkins, male   DOB: 01-22-1961, 55 y.o.   MRN: [id number] Cardiology Office Note  Date:  12/05/2015   ID:  Erik Pigeonobert Baynes, DOB 01-22-1961, MRN [id number]  PCP:  Frazier RichardsIXON,MARY BETH, PA-C   Chief Complaint  Patient presents with  . other    12 month follow up. Meds reviewed by the patient verbally. "doing well."     HPI:  Mr. Erik MeekerMiller is a very pleasant 55 year old gentleman with a long history of smoking, significant hyperlipidemia, hypertension who [facility] with stuttering worsening chest discomfort, troponin elevation 0.33, who had a cardiac catheterization showing severe 99% distal RCA disease. A DES stent was placed . He presents for routine followup of his coronary artery disease   He denies any recent chest pain.  Reports that he is reactive, denies any new symptoms Denies any significant shortness of breath. Tolerating his medications Has [facility]  Previous lab work showing total cholesterol 131, LDL 60, hemoglobin A1c 5.6.  Weight is stable  EKG shows normal sinus rhythm with a rate of 61 beats per minute with no significant ST-T wave changes  Cardiac catheterization showed 99% distal RCA disease, moderate to severe proximal RCA disease estimated at 60-70%, mild diffuse LAD and left circumflex disease, moderate ostial D2 and D3 disease, moderate distal inferior wall hypokinesis on LV gram. A Xience 3.5 x 12 mm DES stent was placed to his distal RCA.  PMH:   has a past medical history of Coronary artery disease; Hyperlipidemia; Hypertension; and MI (myocardial infarction) (HCC).  PSH:    Past Surgical History  Procedure Laterality Date  . Cardiac catheterization    . Fracture surgery Right     wrist  55yo  . Vasectomy  1984    Current Outpatient Prescriptions  Medication Sig Dispense Refill  . aspirin 81 MG tablet Take 81 mg by mouth daily.    . clopidogrel (PLAVIX) 75 MG tablet TAKE 1  TABLET BY MOUTH EVERY DAY 30 tablet 6  . lisinopril (PRINIVIL,ZESTRIL) 10 MG tablet TAKE 1 TABLET BY MOUTH DAILY. 30 tablet 6  . metoprolol tartrate (LOPRESSOR) 25 MG tablet TAKE 1 TABLET BY MOUTH 2 TIMES DAILY. 60 tablet 0  . nitroGLYCERIN (NITROSTAT) 0.4 MG SL tablet Place 1 tablet (0.4 mg total) under the tongue every 5 (five) minutes as needed for chest pain. 25 tablet 6  . rosuvastatin (CRESTOR) 40 MG tablet TAKE 1 TABLET BY MOUTH EVERY DAY 90 tablet 2   No current facility-administered medications for this visit.     Allergies:   Other   Social History:  The patient  reports that he quit smoking about 5 years ago. He has never used smokeless tobacco. He reports that he drinks about 2.2 oz of alcohol per week. He reports that he does not use illicit drugs.   Family History:   family history includes Cancer in his father; Coronary artery disease in his sister; Diabetes in his sister; Hyperlipidemia in his father and sister.    Review of Systems: Review of Systems  Constitutional: Negative.   Respiratory: Negative.   Cardiovascular: Negative.   Gastrointestinal: Negative.   Musculoskeletal: Negative.   Neurological: Negative.   Psychiatric/Behavioral: Negative.   All other systems reviewed and are negative.    PHYSICAL EXAM: VS:  BP 120/80 mmHg  Pulse 61  Ht 5\' 11"  (1.803 m)  Wt 215 lb (97.523 kg)  BMI 30.00 kg/m2 , BMI  Body mass index is 30 kg/(m^2). GEN: Well nourished, well developed, in no acute distress HEENT: normal Neck: no JVD, carotid bruits, or masses Cardiac: RRR; no murmurs, rubs, or gallops,no edema  Respiratory:  clear to auscultation bilaterally, normal work of breathing GI: soft, nontender, nondistended, + BS MS: no deformity or atrophy Skin: warm and dry, no rash Neuro:  Strength and sensation are intact Psych: euthymic mood, full affect    Recent Labs: No results found for requested labs within last 365 days.    Lipid Panel No results found  for: CHOL, HDL, LDLCALC, TRIG    Wt Readings from Last 3 Encounters:  12/05/15 215 lb (97.523 kg)  12/04/14 215 lb 8 oz (97.75 kg)  11/22/13 217 lb 4 oz (98.544 kg)       ASSESSMENT AND PLAN:  Atherosclerosis of native coronary artery of native heart with angina pectoris (HCC) - Plan: EKG 12-Lead Currently with no symptoms of angina. No further workup at this time. Continue current medication regimen.  Secondary hypertension, unspecified - Plan: EKG 12-Lead Blood pressure is well controlled on today's visit. No changes made to the medications.  Smoking hx Reports that he stopped smoking many years ago  Hyperlipidemia Cholesterol is at goal on the current lipid regimen. No changes to the medications were made.    Total encounter time more than 15 minutes  Greater than 50% was spent in counseling and coordination of care with the patient   Disposition:   F/U  6 months   Orders Placed This Encounter  Procedures  . EKG 12-Lead     Signed, Dossie Arbourim Rindi Beechy, M.D., Ph.D. 12/05/2015  [facility], [place] [phone]

## 2016-05-06 ENCOUNTER — Other Ambulatory Visit: Payer: Self-pay | Admitting: Cardiovascular Disease

## 2016-08-14 ENCOUNTER — Telehealth: Payer: Self-pay | Admitting: Cardiovascular Disease

## 2016-08-14 ENCOUNTER — Other Ambulatory Visit: Payer: Self-pay | Admitting: *Deleted

## 2016-08-14 MED ORDER — CLOPIDOGREL BISULFATE 75 MG PO TABS: 75.0000 mg | ORAL_TABLET | Freq: Every day | ORAL | 3 refills | Status: DC

## 2016-08-14 MED ORDER — ROSUVASTATIN CALCIUM 40 MG PO TABS: 40.0000 mg | ORAL_TABLET | Freq: Every day | ORAL | 3 refills | Status: DC

## 2016-08-14 NOTE — Telephone Encounter (Signed)
°*  STAT* If patient is at the pharmacy, call can be transferred to refill team.   1. Which medications need to be refilled? (please list name of each medication and dose if known) Plavix 75 mg po daily crestor 40 mg po   2. Which pharmacy/location (including street and city if local pharmacy) [facility]   3. Do they need a 30 day or 90 day supply? 90

## 2016-08-14 NOTE — Telephone Encounter (Signed)
Requested Prescriptions   Signed Prescriptions Disp Refills  . clopidogrel (PLAVIX) 75 MG tablet 90 tablet 3    Sig: Take 1 tablet (75 mg total) by mouth daily.    Authorizing Provider: GOLLAN, TIMOTHY J    Ordering User: LOPEZ, MARINA C  . rosuvastatin (CRESTOR) 40 MG tablet 90 tablet 3    Sig: Take 1 tablet (40 mg total) by mouth daily.    Authorizing Provider: GOLLAN, TIMOTHY J    Ordering User: LOPEZ, MARINA C    

## 2016-08-14 NOTE — Telephone Encounter (Signed)
Requested Prescriptions   Signed Prescriptions Disp Refills  . clopidogrel (PLAVIX) 75 MG tablet 90 tablet 3    Sig: Take 1 tablet (75 mg total) by mouth daily.    Authorizing Provider: Antonieta IbaGOLLAN, TIMOTHY J    Ordering User: Shawnie DapperLOPEZ, Lovina Zuver C  . rosuvastatin (CRESTOR) 40 MG tablet 90 tablet 3    Sig: Take 1 tablet (40 mg total) by mouth daily.    Authorizing Provider: Antonieta IbaGOLLAN, TIMOTHY J    Ordering User: Kendrick FriesLOPEZ, Gadge Hermiz C

## 2016-10-11 ENCOUNTER — Other Ambulatory Visit: Payer: Self-pay | Admitting: Cardiovascular Disease

## 2016-12-02 ENCOUNTER — Ambulatory Visit: Admitting: Cardiovascular Disease

## 2016-12-05 NOTE — Progress Notes (Deleted)
Patient ID: Erik PigeonRobert Perkins, male   DOB: 1961-01-27, 56 y.o.   MRN: [id number] Cardiology Office Note  Date:  12/05/2016   ID:  Erik PigeonRobert Perkins, DOB 1961-01-27, MRN [id number]  PCP:  Dorena Bodoixon, Mary B, PA-C   No chief complaint on file.   HPI:  Mr. Erik MeekerMiller is a very pleasant 56 year old gentleman with a long history of  smoking,  significant hyperlipidemia,  hypertension  [facility] on January 21 2010 with stuttering worsening chest discomfort, troponin elevation 0.33,  cardiac catheterization showing severe 99% distal RCA disease.   DES stent was placed .  He presents for routine followup of his coronary artery disease   He denies any recent chest pain.  Reports that he is reactive, denies any new symptoms Denies any significant shortness of breath. Tolerating his medications Has [facility]  Previous lab work showing total cholesterol 131, LDL 60, hemoglobin A1c 5.6.  Weight is stable  EKG shows normal sinus rhythm with a rate of 61 beats per minute with no significant ST-T wave changes  Cardiac catheterization showed 99% distal RCA disease, moderate to severe proximal RCA disease estimated at 60-70%, mild diffuse LAD and left circumflex disease, moderate ostial D2 and D3 disease, moderate distal inferior wall hypokinesis on LV gram. A Xience 3.5 x 12 mm DES stent was placed to his distal RCA.  PMH:   has a past medical history of Coronary artery disease; Hyperlipidemia; Hypertension; and MI (myocardial infarction).  PSH:    Past Surgical History:  Procedure Laterality Date  . CARDIAC CATHETERIZATION    . FRACTURE SURGERY Right    wrist  56yo  . VASECTOMY  1984    Current Outpatient Prescriptions  Medication Sig Dispense Refill  . aspirin 81 MG tablet Take 81 mg by mouth daily.    . clopidogrel (PLAVIX) 75 MG tablet Take 1 tablet (75 mg total) by mouth daily. 90 tablet 3  . clopidogrel (PLAVIX) 75 MG tablet TAKE 1 TABLET BY MOUTH EVERY DAY 30  tablet 3  . lisinopril (PRINIVIL,ZESTRIL) 10 MG tablet TAKE 1 TABLET BY MOUTH DAILY. 30 tablet 6  . metoprolol tartrate (LOPRESSOR) 25 MG tablet TAKE 1 TABLET BY MOUTH 2 TIMES DAILY. 60 tablet 0  . nitroGLYCERIN (NITROSTAT) 0.4 MG SL tablet Place 1 tablet (0.4 mg total) under the tongue every 5 (five) minutes as needed for chest pain. 25 tablet 6  . rosuvastatin (CRESTOR) 40 MG tablet Take 1 tablet (40 mg total) by mouth daily. 90 tablet 3   No current facility-administered medications for this visit.      Allergies:   Other   Social History:  The patient  reports that he quit smoking about 6 years ago. He has a 25.00 pack-year smoking history. He has never used smokeless tobacco. He reports that he drinks about 2.2 oz of alcohol per week . He reports that he does not use drugs.   Family History:   family history includes Cancer in his father; Coronary artery disease in his sister; Diabetes in his sister; Hyperlipidemia in his father and sister.    Review of Systems: Review of Systems  Constitutional: Negative.   Respiratory: Negative.   Cardiovascular: Negative.   Gastrointestinal: Negative.   Musculoskeletal: Negative.   Neurological: Negative.   Psychiatric/Behavioral: Negative.   All other systems reviewed and are negative.    PHYSICAL EXAM: VS:  There were no vitals taken for this visit. , BMI There is no height or weight  on file to calculate BMI. GEN: Well nourished, well developed, in no acute distress HEENT: normal Neck: no JVD, carotid bruits, or masses Cardiac: RRR; no murmurs, rubs, or gallops,no edema  Respiratory:  clear to auscultation bilaterally, normal work of breathing GI: soft, nontender, nondistended, + BS MS: no deformity or atrophy Skin: warm and dry, no rash Neuro:  Strength and sensation are intact Psych: euthymic mood, full affect    Recent Labs: No results found for requested labs within last 8760 hours.    Lipid Panel No results found for:  CHOL, HDL, LDLCALC, TRIG    Wt Readings from Last 3 Encounters:  12/05/15 215 lb (97.5 kg)  12/04/14 215 lb 8 oz (97.8 kg)  11/22/13 217 lb 4 oz (98.5 kg)       ASSESSMENT AND PLAN:  Atherosclerosis of native coronary artery of native heart with angina pectoris (HCC) - Plan: EKG 12-Lead Currently with no symptoms of angina. No further workup at this time. Continue current medication regimen.  Secondary hypertension, unspecified - Plan: EKG 12-Lead Blood pressure is well controlled on today's visit. No changes made to the medications.  Smoking hx Reports that he stopped smoking many years ago  Hyperlipidemia Cholesterol is at goal on the current lipid regimen. No changes to the medications were made.    Total encounter time more than 15 minutes  Greater than 50% was spent in counseling and coordination of care with the patient   Disposition:   F/U  6 months   No orders of the defined types were placed in this encounter.    Signed, Dossie Arbour, M.D., Ph.D. 12/05/2016  [facility] [place], [place] [phone]

## 2016-12-08 ENCOUNTER — Ambulatory Visit: Payer: Commercial Managed Care - PPO | Admitting: Cardiovascular Disease

## 2016-12-23 NOTE — Telephone Encounter (Signed)
pts wife would like her husband to have a new pt eval. With Dr. Lona KettleBoota only. Pt never had sleep study. Please schedule accordingly.

## 2017-01-05 DIAGNOSIS — I25118 Atherosclerotic heart disease of native coronary artery with other forms of angina pectoris: Secondary | ICD-10-CM | POA: Insufficient documentation

## 2017-01-05 DIAGNOSIS — I10 Essential (primary) hypertension: Secondary | ICD-10-CM | POA: Insufficient documentation

## 2017-01-05 NOTE — Progress Notes (Signed)
Patient ID: Erik Perkins, male   DOB: [birth year], 56 y.o.   MRN: [id number] Cardiology Office Note  Date:  01/06/2017   ID:  Erik Perkins, DOB [birth year], MRN [id number]  PCP:  Dorena Bodoixon, Mary B, PA-C   Chief Complaint  Patient presents with  . other    12 month follow up. Patient states he is doing well. Meds reviewed verbally with patient.     HPI:  [name] is a very pleasant 56 year old gentleman with a long history of  smoking,  hyperlipidemia,  hypertension  [facility] on January 21 2010 with stuttering worsening chest discomfort,  troponin elevation 0.33,  cardiac catheterization showing severe 99% distal RCA disease.  DES stent was placed . He presents for routine followup of his coronary artery disease  He denies any recent chest pain.  Active at baseline No shortness of breath or chest pain on exertion Tolerating his medications [facility] Total cholesterol remains 135 Previous lab work showing hemoglobin A1c 5.6.   Weight is up 6 pounds from prior clinic visit Lots of soda and suite tea  EKG shows normal sinus rhythm with a rate of 68 beats per minute with no significant ST-T wave changes  Other past medical history reviewed Cardiac catheterization showed 99% distal RCA disease, moderate to severe proximal RCA disease estimated at 60-70%, mild diffuse LAD and left circumflex disease, moderate ostial D2 and D3 disease, moderate distal inferior wall hypokinesis on LV gram. A Xience 3.5 x 12 mm DES stent was placed to his distal RCA.  PMH:   has a past medical history of Coronary artery disease; Hyperlipidemia; Hypertension; and MI (myocardial infarction) (HCC).  PSH:    Past Surgical History:  Procedure Laterality Date  . CARDIAC CATHETERIZATION    . FRACTURE SURGERY Right    wrist  56yo  . VASECTOMY  1984    Current Outpatient Prescriptions  Medication Sig Dispense Refill  . aspirin 81 MG tablet Take 81 mg by mouth daily.    .  clopidogrel (PLAVIX) 75 MG tablet Take 1 tablet (75 mg total) by mouth daily. 90 tablet 3  . lisinopril (PRINIVIL,ZESTRIL) 10 MG tablet TAKE 1 TABLET BY MOUTH DAILY. 30 tablet 6  . metoprolol tartrate (LOPRESSOR) 25 MG tablet TAKE 1 TABLET BY MOUTH 2 TIMES DAILY. 60 tablet 0  . nitroGLYCERIN (NITROSTAT) 0.4 MG SL tablet Place 1 tablet (0.4 mg total) under the tongue every 5 (five) minutes as needed for chest pain. 25 tablet 6  . rosuvastatin (CRESTOR) 40 MG tablet Take 1 tablet (40 mg total) by mouth daily. 90 tablet 3   No current facility-administered medications for this visit.      Allergies:   Other   Social History:  The patient  reports that he quit smoking about 7 years ago. He has a 25.00 pack-year smoking history. He has never used smokeless tobacco. He reports that he drinks about 2.2 oz of alcohol per week . He reports that he does not use drugs.   Family History:   family history includes Cancer in his father; Coronary artery disease in his sister; Diabetes in his sister; Hyperlipidemia in his father and sister.    Review of Systems: Review of Systems  Constitutional: Negative.   Respiratory: Negative.   Cardiovascular: Negative.   Gastrointestinal: Negative.   Musculoskeletal: Negative.   Neurological: Negative.   Psychiatric/Behavioral: Negative.   All other systems reviewed and are negative.    PHYSICAL EXAM: VS:  BP  120/80 (BP Location: Left Arm, Patient Position: Sitting, Cuff Size: Normal)   Pulse 68   Ht 5\' 11"  (1.803 m)   Wt 221 lb 4 oz (100.4 kg)   BMI 30.86 kg/m  , BMI Body mass index is 30.86 kg/m. GEN: Well nourished, well developed, in no acute distress  HEENT: normal  Neck: no JVD, carotid bruits, or masses Cardiac: RRR; no murmurs, rubs, or gallops,no edema  Respiratory:  clear to auscultation bilaterally, normal work of breathing GI: soft, nontender, nondistended, + BS MS: no deformity or atrophy  Skin: warm and dry, no rash Neuro:  Strength  and sensation are intact Psych: euthymic mood, full affect    Recent Labs: No results found for requested labs within last 8760 hours.    Lipid Panel No results found for: CHOL, HDL, LDLCALC, TRIG    Wt Readings from Last 3 Encounters:  01/06/17 221 lb 4 oz (100.4 kg)  12/05/15 215 lb (97.5 kg)  12/04/14 215 lb 8 oz (97.8 kg)       ASSESSMENT AND PLAN:  Atherosclerosis of native coronary artery of native heart with angina pectoris (HCC) - Plan: EKG 12-Lead Currently with no symptoms of angina. No further workup at this time. Continue current medication regimen.  Essential hypertension Blood pressure is well controlled on today's visit. No changes made to the medications.  Smoking hx stopped smoking many years ago Denies any shortness of breath symptoms  Hyperlipidemia Cholesterol is at goal on the current lipid regimen. No changes to the medications were made. [facility] to [facility]    Total encounter time more than 15 minutes  Greater than 50% was spent in counseling and coordination of care with the patient   Disposition:   F/U  12 months   Orders Placed This Encounter  Procedures  . EKG 12-Lead     Signed, Dossie Arbourim Moksha Dorgan, M.D., Ph.D. 01/06/2017  [facility], [place] [phone]

## 2017-01-06 ENCOUNTER — Ambulatory Visit (INDEPENDENT_AMBULATORY_CARE_PROVIDER_SITE_OTHER): Payer: Commercial Managed Care - PPO | Admitting: Cardiovascular Disease

## 2017-01-06 ENCOUNTER — Encounter: Payer: Self-pay | Admitting: Cardiovascular Disease

## 2017-01-06 VITALS — BP 120/80 | HR 68 | Ht 71.0 in | Wt 221.2 lb

## 2017-01-06 DIAGNOSIS — E782 Mixed hyperlipidemia: Secondary | ICD-10-CM | POA: Diagnosis not present

## 2017-01-06 DIAGNOSIS — Z87891 Personal history of nicotine dependence: Secondary | ICD-10-CM

## 2017-01-06 DIAGNOSIS — I25118 Atherosclerotic heart disease of native coronary artery with other forms of angina pectoris: Secondary | ICD-10-CM

## 2017-01-06 DIAGNOSIS — I10 Essential (primary) hypertension: Secondary | ICD-10-CM

## 2017-01-06 NOTE — Patient Instructions (Signed)

## 2017-01-13 ENCOUNTER — Telehealth: Payer: Self-pay | Admitting: Cardiovascular Disease

## 2017-01-13 ENCOUNTER — Other Ambulatory Visit: Payer: Self-pay

## 2017-01-13 MED ORDER — LISINOPRIL 10 MG PO TABS: 10.0000 mg | ORAL_TABLET | Freq: Every day | ORAL | 6 refills | Status: DC

## 2017-01-13 MED ORDER — METOPROLOL TARTRATE 25 MG PO TABS: 25.0000 mg | ORAL_TABLET | Freq: Two times a day (BID) | ORAL | 0 refills | Status: DC

## 2017-01-13 NOTE — Telephone Encounter (Signed)
°*  STAT* If patient is at the pharmacy, call can be transferred to refill team.   1. Which medications need to be refilled? (please list name of each medication and dose if known)    Lisinopril 10 mg po daily    Metoprolol 25 mg po BID   2. Which pharmacy/location (including street and city if local pharmacy) [facility]   3. Do they need a 30 day or 90 day supply? 90

## 2017-01-13 NOTE — Telephone Encounter (Signed)
Requested Prescriptions   Signed Prescriptions Disp Refills  . lisinopril (PRINIVIL,ZESTRIL) 10 MG tablet 30 tablet 6    Sig: Take 1 tablet (10 mg total) by mouth daily.    Authorizing Provider: GOLLAN, TIMOTHY J    Ordering User: Jabes Primo N  . metoprolol tartrate (LOPRESSOR) 25 MG tablet 60 tablet 0    Sig: Take 1 tablet (25 mg total) by mouth 2 (two) times daily.    Authorizing Provider: GOLLAN, TIMOTHY J    Ordering User: Stacy Deshler N    

## 2017-01-13 NOTE — Telephone Encounter (Signed)
Requested Prescriptions   Signed Prescriptions Disp Refills  . lisinopril (PRINIVIL,ZESTRIL) 10 MG tablet 30 tablet 6    Sig: Take 1 tablet (10 mg total) by mouth daily.    Authorizing Provider: Antonieta IbaGOLLAN, TIMOTHY J    Ordering User: Margrett RudSLAYTON, BRITTANY N  . metoprolol tartrate (LOPRESSOR) 25 MG tablet 60 tablet 0    Sig: Take 1 tablet (25 mg total) by mouth 2 (two) times daily.    Authorizing Provider: Antonieta IbaGOLLAN, TIMOTHY J    Ordering User: Margrett RudSLAYTON, BRITTANY N

## 2017-01-24 ENCOUNTER — Other Ambulatory Visit: Payer: Self-pay | Admitting: Cardiovascular Disease

## 2017-02-05 ENCOUNTER — Ambulatory Visit: Admit: 2017-02-05 | Discharge: 2017-02-05 | Payer: BLUE CROSS/BLUE SHIELD | Primary: Family Medicine

## 2017-02-05 DIAGNOSIS — R0683 Snoring: Secondary | ICD-10-CM

## 2017-02-05 NOTE — Progress Notes (Signed)
[facility]  [address]  [place], [place] [address]  [phone]    Patient Name:  Hayden Franco  Date of Birth:  1960/12/22      Office Visit 02/05/2017    CHIEF COMPLAINT:    Chief Complaint   Patient presents with   ??? Sleep Problem   ??? New Patient       HISTORY OF PRESENT ILLNESS:      The patient present for evaluation and management of snoring and possible obstructive sleep apnea.    The patient component with his wife who indicated he has an ongoing loud snoring, witnessed apneas and she has recorded him to prove he has these episodes.  There is no history of cataplexy, hypnagogic hallucinations, or sleep paralysis.  In addition, there is no history of frequent leg movements, kicking at night, or an inability to keep the legs still.  He indicated he sleeps approximately 11 PM and 7 AM daily.  Denies any problem falling asleep and staying asleep.  The patient himself denies any significant problem to his sleep but his wife is quite disturbed by his sleep problem and she cannot sleep herself because of that.  He never had any sleep evaluation previously.        Sleepiness Scale:     Sitting and reading 0    Watching TV 2    Sitting inactive in a public place 0    As a passenger in a car for an hour without a break 0    Lying down to rest in the afternoon when circumstances Permits 0    Sitting and talking to someone 0    Sitting quietly after lunch without alcohol 0    In a car, while stopped for a few minutes 0    Total :  2    History reviewed. No pertinent past medical history.      Problem List  Never Reviewed          Codes Class Noted    Snoring ICD-10-CM: R06.83  ICD-9-CM: 786.09  02/05/2017        Witnessed apneic spells ICD-10-CM: R06.81  ICD-9-CM: 786.03  02/05/2017                History reviewed. No pertinent surgical history.    No flowsheet data found.      Social History     Social History   ??? Marital status: MARRIED     Spouse name: N/A    ??? Number of children: N/A   ??? Years of education: N/A     Occupational History   ??? Not on file.     Social History Main Topics   ??? Smoking status: Never Smoker   ??? Smokeless tobacco: Never Used   ??? Alcohol use 0.6 oz/week     1 Cans of beer per week   ??? Drug use: No   ??? Sexual activity: Not on file     Other Topics Concern   ??? Not on file     Social History Narrative   ??? No narrative on file         History reviewed. No pertinent family history.      Not on File      No current outpatient prescriptions on file.     No current facility-administered medications for this visit.            REVIEW OF SYSTEMS:  CONSTITUTIONAL:   There is no history of fever, chills, night sweats, weight loss, weight gain, persistent fatigue, or lethargy/hypersomnolence.   EYES:   Denies problems with eye pain, erythema, blurred vision, or visual field loss.   ENTM:   Denies history of tinnitus, epistaxis, sore throat, hoarseness, or dysphonia.   LYMPH:   Denies swollen glands.   CARDIAC:   No chest pain, pressure, discomfort, palpitations, orthopnea, murmurs, or edema.   GI:   No dysphagia, heartburn reflux, nausea/vomiting, diarrhea, abdominal pain, or bleeding.   GU:   Denies history of dysuria, hematuria, polyuria, or decreased urine output.   MS:   No history of myalgias, arthralgias, bone pain, or muscle cramps.   SKIN:   No history of rashes, jaundice, cyanosis, nodules, or ulcers.   ENDO:   Negative for heat or cold intolerance.  No history of DM.   PSYCH:   Negative for anxiety, depression, insomnia, hallucinations.   NEURO:   There is no history of AMS, persistent headache, decreased level of consciousness, seizures, or motor or sensory deficits.      PHYSICAL EXAM:    Visit Vitals   ??? BP 124/84   ??? Pulse 67   ??? Resp 18   ??? Ht 6\' 4"  (1.93 m)   ??? Wt 263 lb 9.6 oz (119.6 kg)   ??? SpO2 94%   ??? BMI 32.09 kg/m2        GENERAL APPEARANCE:   The patient is normal weight and in no respiratory distress.   HEENT:    PERRL.  Conjunctivae unremarkable.   Nasal mucosa is without epistaxis, exudate, or polyps.  Gums and dentition are unremarkable.  There is moderate oropharyngeal narrowing.  TMs are clear.   NECK/LYMPHATIC:   Symmetrical with no elevation of jugular venous pulsation.  Trachea midline. No thyroid enlargement.  No cervical adenopathy.   LUNGS:   Normal respiratory effort with symmetrical lung expansion.   Breath sounds are diminished in the bases.   HEART:   There is a regular rate and rhythm.  No murmur, rub, or gallop.  There is no edema in the lower extremities.   ABDOMEN:   Soft and non-tender.  No hepatosplenomegaly.  Bowel sounds are normal.     SKIN:   There are no rashes, cyanosis, jaundice, or ecchymosis present.   EXTREMITIES:   The extremities are unremarkable without clubbing, cyanosis, joint inflammation, degenerative, or ischemic change.   MUSCULOSKELETAL:   There is no abnormal tone, muscle atrophy, or abnormal movement present.   NEURO:   The patient is alert and oriented to person, place, and time.  Memory appears intact and mood is normal.  No gross sensorimotor deficits are present.         ASSESSMENT:  (Medical Decision Making)         ICD-10-CM ICD-9-CM    1. Snoring, associated with witnessed apnea and sleep disruption.  Further evaluation with home sleep study is needed at this time   R06.83 786.09 REFERRAL TO SLEEP STUDIES   2. Witnessed apneic spells R06.81 786.03 REFERRAL TO SLEEP STUDIES          PLAN:    Schedule the patient for home sleep study    Sleep hygiene and positional therapy is needed      Appropriate instruction regarding sleep disorders in general are given to the patient along with copy of the sleep hygiene    He will return to the sleep center in 1 month for  further evaluation       Orders Placed This Encounter   ??? REFERRAL TO SLEEP STUDIES        Over 50% of today's office visit was spent in face to face time reviewing  test results, prognosis, importance of compliance, education about disease process, benefits of medications, instructions for management of acute flare-ups, and follow up plans.  Total face to face time spent with patient was 30 minutes.    Alverda Skeans, MD  Electronically signed

## 2017-02-05 NOTE — Patient Instructions (Signed)
Snoring: After Your Visit    Your Care Instructions  Snoring is a noise that you may make while breathing during sleep. You snore when the flow of air from your mouth or nose to your lungs makes the tissues of your throat vibrate while you sleep. This usually is caused by a blockage or narrowing in your nose, mouth, or throat (airway).  Snoring can be soft, loud, raspy, harsh, hoarse, or fluttering. Your bed partner may notice that you sleep with your mouth open and that you are restless while sleeping. If snoring interferes with your or your bed partner's sleep, either or both of you may feel tired during the day.  You may be able to help reduce your snoring by making changes in your activities and in the way you sleep.  Follow-up care is a key part of your treatment and safety. Be sure to make and go to all appointments, and call your doctor if you are having problems. It's also a good idea to know your test results and keep a list of the medicines you take.  How can you care for yourself at home?  ?? Lose weight, if needed. Many people who snore are overweight. Weight loss can help reduce the narrowing of the airway and might reduce or stop snoring.  ?? Limit the use of alcohol and medicines. Drinking a lot of alcohol or taking certain medicines, especially sleeping pills or tranquilizers, before sleep may make snoring worse.  ?? Go to bed at the same time each night, and get plenty of sleep. You may snore more when you have not had enough sleep.  ?? Sleep on your side. Sleeping on your side may stop snoring. Try sewing a pocket in the middle of the back of your pajama top, putting a tennis ball into the pocket, and stitching it closed. This will help keep you from sleeping on your back.  ?? Treat breathing problems. Breathing problems caused by colds or allergies can disturb airflow. This can lead to snoring.  ?? Use a device that helps keep your airway open during sleep. This could  be a device that you put in your mouth. Other examples include strips or disks that you use on your nose.  ?? Do not smoke. Smoking can make snoring worse. If you need help quitting, talk to your doctor about stop-smoking programs and medicines. These can increase your chances of quitting for good.  ?? Raise the head of your bed 4 to 6 inches by putting bricks under the legs of the bed. This may prevent your tongue from falling toward the back of the throat, which can make a blocked or narrow airway worse. Putting pillows under your head will not help.  When should you call for help?  Watch closely for changes in your health, and be sure to contact your doctor if:  ?? You snore, and you feel sleepy during the day.  ?? Your sleeping partner or you notice that you gasp, choke, or stop breathing during sleep.  ?? You do not get better as expected.   Where can you learn more?   Go to http://www.healthwise.net/[facility]  Enter J563 in the search box to learn more about "Snoring: After Your Visit."   ?? 2006-2015 Healthwise, Incorporated. Care instructions adapted under license by [place] (which disclaims liability or warranty for this information). This care instruction is for use with your licensed healthcare professional. If you have questions about a medical condition or this instruction, always   ask your healthcare professional. Healthwise, Incorporated disclaims any warranty or liability for your use of this information.  Content Version: 10.5.422740; Current as of: February 15, 2013            Sleep Studies: About This Test    What is it?  Sleep studies are tests that watch what happens to your body during sleep. These studies usually are done in a sleep lab. Sleep labs are often located in hospitals. But sleep studies also can be done with portable equipment that you use at home.    Why is this test done?  Sleep studies are done to find sleep problems, including:  ?? Sleep apnea or excessive snoring.  ?? Insomnia.   ?? Narcolepsy.  ?? Heart rhythm problems.  ?? Repeated muscle twitching of the feet, arms, or legs while you sleep.  ?? Shift work sleep disorder.  How can you prepare for the test?  ?? You may be asked to keep a sleep diary for 1 to 2 weeks before your sleep study.  ?? Don't take any naps for 2 to 3 days before your test.  ?? You may be asked to avoid food or drinks with caffeine for a day or two before your test.  ?? Take a shower or bath before your test, but don't use sprays, oils, or gels on your hair. Don't wear makeup, fingernail polish, or fake nails.  ?? Pack and take along a small overnight bag with personal items, such as a toothbrush, a comb, favorite pillows or blankets, and a book. You can wear your own nightclothes.    Should I take my medications?  Yes, ,unless specifically instructed by her physician taken medications as usual.  Also bring any medication.  He would need during the night or early in the morning.  The sleep Center, does not provide medication or snacks.  It is important for the sleep gestational to know what medication she was taking this many medications can affect your sleep.      What happens during the test?  ?? In the sleep lab, you will be in a private room, much like a hotel room.  ?? There may be a waiting period. You may read, watch TV, or relax during this time.  If you have a commitment in the morning, be sure to inform the technician so they will be able to make sure you are out early enough in the morning.  Otherwise, you can expect to be discharged between 6:00 - 6:30 in the morning.  ?? Small pads or patches called electrodes will be placed on your head and body with a small amount of glue and tape. These will record things like brain activity, eye movement, oxygen levels, and snoring.  ?? Soft elastic belts will be placed around your chest and belly to measure your breathing.  ?? Your blood oxygen levels will be checked by a small clip (oximeter)  placed either on the tip of your index finger or on your earlobe.  ?? If you have sleep apnea, you may wear a mask that is connected to a continuous positive airway pressure (CPAP) machine.  What else should you know about the test?  ?? It may feel odd to be hooked to the sleep study equipment. The sleep lab technician understands that your sleep may not be the same as it is at home because of the equipment. Try to relax and make yourself as comfortable as possible.  ?? After your sleep   problem has been identified, you may need a second study if your doctor orders treatment such as CPAP.  ?? Portable sleep study equipment is available for a person to do sleep studies at home. This may be a choice for people who have problems sleeping in a sleep lab. But home sleep studies may not give the same results as a sleep lab.  How long does the test take?  ?? You will stay in the sleep lab overnight.   ?? Parking is available in the visitor's lot      Will I be able to get out of bed to use the restroom?  Yes. All you have to do is wave your hands are call out  And the  Technologist will unhook the ponytail of wires from the box and you will be free to use the restroom.      What happens after the test?  ?? You will be able to go home right away.  ?? You can go back to your usual activities right away.  ??   Follow-up care is a key part of your treatment and safety. Be sure to make and go to all appointments, and call your doctor if you are having problems. It's also a good idea to keep a list of the medicines you take. Ask your doctor when you can expect to have your test results.   Where can you learn more?   Go to http://www.healthwise.net/[facility]  Enter C688 in the search box to learn more about "Sleep Studies: About This Test."   ?? 2006-2015 Healthwise, Incorporated. Care instructions adapted under license by [place] (which disclaims liability or warranty for this  information). This care instruction is for use with your licensed healthcare professional. If you have questions about a medical condition or this instruction, always ask your healthcare professional. Healthwise, Incorporated disclaims any warranty or liability for your use of this information.  Content Version: 10.5.422740; Current as of: February 15, 2013    Revisions 01/04/2014          Learning About Sleeping Well  What does sleeping well mean?  Sleeping well means getting enough sleep. How much sleep is enough varies among people.  The number of hours you sleep is not as important as how you feel when you wake up. If you do not feel refreshed, you probably need more sleep. Another sign of not getting enough sleep is feeling tired during the day.  The average total nightly sleep time is 7?? to 8 hours. Healthy adults may need a little more or a little less than this.  Why is getting enough sleep important?  Getting enough quality sleep is a basic part of good health. When your sleep suffers, your mood and your thoughts can suffer too. You may find yourself feeling more grumpy or stressed. Not getting enough sleep also can lead to serious problems, including injury, accidents, anxiety, and depression.  What might cause poor sleeping?  Many things can cause sleep problems, including:  ?? Stress. Stress can be caused by fear about a single event, such as giving a speech. Or you may have ongoing stress, such as worry about work or school.  ?? Depression, anxiety, and other mental or emotional conditions.  ?? Changes in your sleep habits or surroundings. This includes changes that happen where you sleep, such as noise, light, or sleeping in a different bed. It also includes changes in your sleep pattern, such as having jet lag or working a   late shift.  ?? Health problems, such as pain, breathing problems, and restless legs syndrome.  ?? Lack of regular exercise.  How can you help yourself?   Here are some tips that may help you sleep more soundly and wake up feeling more refreshed.  Your sleeping area   ?? Use your bedroom only for sleeping and sex. A bit of light reading may help you fall asleep. But if it doesn't, do your reading elsewhere in the house. Don't watch TV in bed.  ?? Be sure your bed is big enough to stretch out comfortably, especially if you have a sleep partner.  ?? Keep your bedroom quiet, dark, and cool. Use curtains, blinds, or a sleep mask to block out light. To block out noise, use earplugs, soothing music, or a "white noise" machine.  Your evening and bedtime routine   ?? Get regular exercise, but not within 3 to 4 hours before your bedtime.  ?? Create a relaxing bedtime routine. You might want to take a warm shower or bath, listen to soothing music, or drink a cup of noncaffeinated tea.  ?? Go to bed at the same time every night. And get up at the same time every morning, even if you feel tired.  What to avoid   ?? Limit caffeine (coffee, tea, caffeinated sodas) during the day, and don't have any for at least 4 to 6 hours before bedtime.  ?? Don't drink alcohol before bedtime. Alcohol can cause you to wake up more often during the night.  ?? Don't smoke or use tobacco, especially in the evening. Nicotine can keep you awake.  ?? Don't take naps during the day, especially close to bedtime.  ?? Don't lie in bed awake for too long. If you can't fall asleep, or if you wake up in the middle of the night and can't get back to sleep within 15 minutes or so, get out of bed and go to another room until you feel sleepy.  ?? Don't take medicine right before bed that may keep you awake or make you feel hyper or energized. Your doctor can tell you if your medicine may do this and if you can take it earlier in the day.  If you can't sleep   ?? Imagine yourself in a peaceful, pleasant scene. Focus on the details and feelings of being in a place that is relaxing.   ?? Get up and do a quiet or boring activity until you feel sleepy.  ?? Don't drink any liquids after 6 p.m. if you wake up often because you have to go to the bathroom.   Where can you learn more?   Go to http://www.healthwise.net/[facility]  Enter J942 in the search box to learn more about "Learning About Sleeping Well."   ?? 2006-2015 Healthwise, Incorporated. Care instructions adapted under license by [place] (which disclaims liability or warranty for this information). This care instruction is for use with your licensed healthcare professional. If you have questions about a medical condition or this instruction, always ask your healthcare professional. Healthwise, Incorporated disclaims any warranty or liability for your use of this information.  Content Version: 10.5.422740; Current as of: April 22, 2013

## 2017-02-26 DIAGNOSIS — G4733 Obstructive sleep apnea (adult) (pediatric): Secondary | ICD-10-CM

## 2017-02-27 ENCOUNTER — Inpatient Hospital Stay: Admit: 2017-02-27 | Payer: BLUE CROSS/BLUE SHIELD | Primary: Family Medicine

## 2017-03-02 ENCOUNTER — Encounter: Payer: BLUE CROSS/BLUE SHIELD | Primary: Family Medicine

## 2017-03-19 ENCOUNTER — Ambulatory Visit: Payer: BLUE CROSS/BLUE SHIELD | Primary: Family Medicine

## 2017-05-14 ENCOUNTER — Other Ambulatory Visit: Payer: Self-pay | Admitting: Cardiovascular Disease

## 2017-07-17 ENCOUNTER — Other Ambulatory Visit: Payer: Self-pay | Admitting: Cardiovascular Disease

## 2017-07-25 ENCOUNTER — Other Ambulatory Visit: Payer: Self-pay | Admitting: Cardiovascular Disease

## 2018-03-30 ENCOUNTER — Telehealth: Payer: Self-pay | Admitting: Cardiovascular Disease

## 2018-03-30 MED ORDER — ROSUVASTATIN CALCIUM 40 MG PO TABS: 40.0000 mg | ORAL_TABLET | Freq: Every day | ORAL | 1 refills | Status: DC

## 2018-03-30 MED ORDER — LISINOPRIL 10 MG PO TABS: 10.0000 mg | ORAL_TABLET | Freq: Every day | ORAL | 1 refills | Status: DC

## 2018-03-30 MED ORDER — METOPROLOL TARTRATE 25 MG PO TABS: 25.0000 mg | ORAL_TABLET | Freq: Two times a day (BID) | ORAL | 1 refills | Status: DC

## 2018-03-30 MED ORDER — CLOPIDOGREL BISULFATE 75 MG PO TABS: 75.0000 mg | ORAL_TABLET | Freq: Every day | ORAL | 1 refills | Status: DC

## 2018-03-30 NOTE — Telephone Encounter (Signed)
°*  STAT* If patient is at the pharmacy, call can be transferred to refill team.   1. Which medications need to be refilled? (please list name of each medication and dose if known)    Plavix 75 mg po q d   Lisinopril 10 mg po q d   Metoprolol 25 mg po q d    Rosuvastatin 40 mg po q d   2. Which pharmacy/location (including street and city if local pharmacy) is medication to be sent to? [facility] [address]   3. Do they need a 30 day or 90 day supply? 90   [facility]

## 2018-03-30 NOTE — Telephone Encounter (Signed)
Pt last seen 7/18 pt OD for 12 month f/u. Pt scheduled for f/u 05/19/18. Please advise if ok to refill until pt is seen.

## 2018-03-30 NOTE — Telephone Encounter (Signed)
Refilled medications for 2 months worth with message to keep appointment for further refills.

## 2018-04-24 ENCOUNTER — Other Ambulatory Visit: Payer: Self-pay | Admitting: Cardiovascular Disease

## 2018-05-19 ENCOUNTER — Ambulatory Visit: Payer: Commercial Managed Care - PPO | Admitting: Cardiovascular Disease

## 2018-05-30 NOTE — Progress Notes (Deleted)
Patient ID: Erik Perkins, male   DOB: 04/11/1961, 57 y.o.   MRN: [id number] Cardiology Office Note  Date:  12/22/2[name]019   ID:  Erik PigeonRobert Perkins, DOB 04/11/1961, MRN [id number]  PCP:  Dorena Bodoixon, Mary B, PA-C   No chief complaint on file.   HPI:  Mr. Erik MeekerMiller is a very pleasant 57 year old gentleman with a long history of  smoking,  hyperlipidemia,  hypertension  [facility] on January 21 2010 with stuttering worsening chest discomfort,  troponin elevation 0.33,  cardiac catheterization showing severe 99% distal RCA disease.  DES stent was placed . He presents for routine followup of his coronary artery disease  He denies any recent chest pain.  Active at baseline No shortness of breath or chest pain on exertion Tolerating his medications [facility] Total cholesterol remains 135 Previous lab work showing hemoglobin A1c 5.6.   Weight is up 6 pounds from prior clinic visit Lots of soda and suite tea  EKG shows normal sinus rhythm with a rate of 68 beats per minute with no significant ST-T wave changes  Other past medical history reviewed Cardiac catheterization showed 99% distal RCA disease, moderate to severe proximal RCA disease estimated at 60-70%, mild diffuse LAD and left circumflex disease, moderate ostial D2 and D3 disease, moderate distal inferior wall hypokinesis on LV gram. A Xience 3.5 x 12 mm DES stent was placed to his distal RCA.  PMH:   has a past medical history of Coronary artery disease, Hyperlipidemia, Hypertension, and MI (myocardial infarction) (HCC).  PSH:    Past Surgical History:  Procedure Laterality Date  . CARDIAC CATHETERIZATION    . FRACTURE SURGERY Right    wrist  57yo  . VASECTOMY  1984    Current Outpatient Medications  Medication Sig Dispense Refill  . aspirin 81 MG tablet Take 81 mg by mouth daily.    . clopidogrel (PLAVIX) 75 MG tablet Take 1 tablet (75 mg total) by mouth daily. Please keep appointment for further refills.  Thanks! 30 tablet 1  . lisinopril (PRINIVIL,ZESTRIL) 10 MG tablet TAKE 1 TABLET DAILY 90 tablet 0  . metoprolol tartrate (LOPRESSOR) 25 MG tablet TAKE 1 TABLET TWICE A DAY 180 tablet 0  . nitroGLYCERIN (NITROSTAT) 0.4 MG SL tablet Place 1 tablet (0.4 mg total) under the tongue every 5 (five) minutes as needed for chest pain. 25 tablet 6  . rosuvastatin (CRESTOR) 40 MG tablet Take 1 tablet (40 mg total) by mouth daily. Please keep appointment for further refills. 30 tablet 1   No current facility-administered medications for this visit.      Allergies:   Other   Social History:  The patient  reports that he quit smoking about 8 years ago. He has a 25.00 pack-year smoking history. He has never used smokeless tobacco. He reports current alcohol use of about 4.0 standard drinks of alcohol per week. He reports that he does not use drugs.   Family History:   family history includes Cancer in his father; Coronary artery disease in his sister; Diabetes in his sister; Hyperlipidemia in his father and sister.    Review of Systems: Review of Systems  Constitutional: Negative.   Respiratory: Negative.   Cardiovascular: Negative.   Gastrointestinal: Negative.   Musculoskeletal: Negative.   Neurological: Negative.   Psychiatric/Behavioral: Negative.   All other systems reviewed and are negative.    PHYSICAL EXAM: VS:  There were no vitals taken for this visit. , BMI There is no height or  weight on file to calculate BMI. GEN: Well nourished, well developed, in no acute distress  HEENT: normal  Neck: no JVD, carotid bruits, or masses Cardiac: RRR; no murmurs, rubs, or gallops,no edema  Respiratory:  clear to auscultation bilaterally, normal work of breathing GI: soft, nontender, nondistended, + BS MS: no deformity or atrophy  Skin: warm and dry, no rash Neuro:  Strength and sensation are intact Psych: euthymic mood, full affect    Recent Labs: No results found for requested labs within  last 8760 hours.    Lipid Panel No results found for: CHOL, HDL, LDLCALC, TRIG    Wt Readings from Last 3 Encounters:  01/06/17 221 lb 4 oz (100.4 kg)  12/05/15 215 lb (97.5 kg)  12/04/14 215 lb 8 oz (97.8 kg)       ASSESSMENT AND PLAN:  Atherosclerosis of native coronary artery of native heart with angina pectoris (HCC) - Plan: EKG 12-Lead Currently with no symptoms of angina. No further workup at this time. Continue current medication regimen.  Essential hypertension Blood pressure is well controlled on today's visit. No changes made to the medications.  Smoking hx stopped smoking many years ago Denies any shortness of breath symptoms  Hyperlipidemia Cholesterol is at goal on the current lipid regimen. No changes to the medications were made. [facility]    Total encounter time more than 15 minutes  Greater than 50% was spent in counseling and coordination of care with the patient   Disposition:   F/U  12 months   No orders of the defined types were placed in this encounter.    Signed, Dossie Arbourim Hennie Gosa, M.D., Ph.D. 05/30/2018  [facility], [place] [phone]

## 2018-05-31 ENCOUNTER — Ambulatory Visit: Admitting: Cardiovascular Disease

## 2018-06-04 ENCOUNTER — Encounter: Payer: Self-pay | Admitting: Nurse Practitioner

## 2018-06-04 ENCOUNTER — Ambulatory Visit: Admitting: Nurse Practitioner

## 2018-06-04 DIAGNOSIS — R0989 Other specified symptoms and signs involving the circulatory and respiratory systems: Secondary | ICD-10-CM

## 2018-06-04 NOTE — Progress Notes (Deleted)
    Office Visit    Patient Name: Erik Perkins Date of Encounter: 06/04/2018  Primary Care Provider:  [facility] Primary Cardiologist:  Erik Nordmannimothy Gollan, Perkins  Chief Complaint    57 year old male with a history of coronary artery disease status post PCI of the RCA in 2011, hypertension, hyperlipidemia, and remote tobacco abuse, who presents for follow-up of coronary artery disease.  Past Medical History    Past Medical History:  Diagnosis Date  . Coronary artery disease    a. 01/2010 NSTEMI/PCI: Mild diff LDA/LCX dzs, moderate ostial D2/3 dzs. RCA 60-70p, 99d (3.5x12 Xience DES).  . Hyperlipidemia   . Hypertension   . MI (myocardial infarction) (HCC)    10/2004, 01/2010   Past Surgical History:  Procedure Laterality Date  . CARDIAC CATHETERIZATION    . FRACTURE SURGERY Right    wrist  57yo  . VASECTOMY  1984    Allergies  Allergies  Allergen Reactions  . Other Rash    Cephadrine    History of Present Illness    57 year old male with the above past medical history including coronary artery disease, hypertension, hyperlipidemia, and remote tobacco abuse.  He suffered a non-STEMI in August 2011 and underwent catheterization revealing severe distal RCA disease.  This was successfully treated with drug-eluting stent.  He was last seen in clinic in July 2018, at which time he was doing well from a cardiac standpoint.  Home Medications    Prior to Admission medications   Medication Sig Start Date End Date Taking? Authorizing Provider  aspirin 81 MG tablet Take 81 mg by mouth daily.    Provider, Historical, Perkins  clopidogrel (PLAVIX) 75 MG tablet Take 1 tablet (75 mg total) by mouth daily. Please keep appointment for further refills. Thanks! 03/30/18   Erik Perkins  lisinopril (PRINIVIL,ZESTRIL) 10 MG tablet TAKE 1 TABLET DAILY 04/26/18   Erik Perkins  metoprolol tartrate (LOPRESSOR) 25 MG tablet TAKE 1 TABLET TWICE A DAY 04/26/18   Erik Perkins  nitroGLYCERIN (NITROSTAT) 0.4 MG SL tablet Place 1 tablet (0.4 mg total) under the tongue every 5 (five) minutes as needed for chest pain. 12/05/15   Erik Perkins  rosuvastatin (CRESTOR) 40 MG tablet Take 1 tablet (40 mg total) by mouth daily. Please keep appointment for further refills. 03/30/18   Erik Perkins    Review of Systems    ***.  All other systems reviewed and are otherwise negative except as noted above.  Physical Exam    VS:  There were no vitals taken for this visit. , BMI There is no height or weight on file to calculate BMI. GEN: Well nourished, well developed, in no acute distress. HEENT: normal. Neck: Supple, no JVD, carotid bruits, or masses. Cardiac: RRR, no murmurs, rubs, or gallops. No clubbing, cyanosis, edema.  Radials/DP/PT 2+ and equal bilaterally.  Respiratory:  Respirations regular and unlabored, clear to auscultation bilaterally. GI: Soft, nontender, nondistended, BS + x 4. MS: no deformity or atrophy. Skin: warm and dry, no rash. Neuro:  Strength and sensation are intact. Psych: Normal affect.  Accessory Clinical Findings    ECG personally reviewed by me today - *** - no acute changes.  Assessment & Plan    1.  ***   Erik Duckinghristopher Bridget Westbrooks, NP 06/04/2018, 1:18 PM

## 2018-06-07 ENCOUNTER — Encounter: Payer: Self-pay | Admitting: Nurse Practitioner

## 2018-06-08 ENCOUNTER — Telehealth: Payer: Self-pay | Admitting: Cardiovascular Disease

## 2018-06-08 NOTE — Telephone Encounter (Signed)
[facility] calling States that [place] some information Will need a release signed and faxed to [phone] for records

## 2018-06-08 NOTE — Telephone Encounter (Signed)
Patient has cancelled upcoming appointment.  Will keep this number on file for the next time he schedules.

## 2018-06-16 ENCOUNTER — Ambulatory Visit: Admitting: Nurse Practitioner

## 2018-07-12 ENCOUNTER — Other Ambulatory Visit: Payer: Self-pay | Admitting: Cardiovascular Disease

## 2018-07-12 NOTE — Telephone Encounter (Signed)
Patient will need an appointment for further refills.  

## 2018-07-13 ENCOUNTER — Telehealth: Payer: Self-pay

## 2018-07-13 NOTE — Telephone Encounter (Signed)
-----   Message from Margrett Rud, [place] sent at 07/12/2018  8:52 AM EST ----- Regarding: Appointment for refills Patient needs an appointment for further refills. If patient does not want to schedule an appointment please make them aware to contact PCP for refills. I have sent in enough medication until appointment can be made.   Thanks Ladies!

## 2018-07-25 ENCOUNTER — Other Ambulatory Visit: Payer: Self-pay | Admitting: Cardiovascular Disease

## 2018-08-25 ENCOUNTER — Other Ambulatory Visit: Payer: Self-pay

## 2018-08-25 ENCOUNTER — Ambulatory Visit (INDEPENDENT_AMBULATORY_CARE_PROVIDER_SITE_OTHER): Admitting: Cardiovascular Disease

## 2018-08-25 ENCOUNTER — Encounter: Payer: Self-pay | Admitting: Cardiovascular Disease

## 2018-08-25 ENCOUNTER — Telehealth: Payer: Self-pay | Admitting: Cardiovascular Disease

## 2018-08-25 VITALS — BP 130/74 | HR 62 | Ht 70.0 in | Wt 215.8 lb

## 2018-08-25 DIAGNOSIS — I25118 Atherosclerotic heart disease of native coronary artery with other forms of angina pectoris: Secondary | ICD-10-CM

## 2018-08-25 MED ORDER — LISINOPRIL 5 MG PO TABS: 5.0000 mg | ORAL_TABLET | Freq: Every day | ORAL | 3 refills | Status: DC

## 2018-08-25 MED ORDER — FUROSEMIDE 20 MG PO TABS: 20.0000 mg | ORAL_TABLET | Freq: Every day | ORAL | 3 refills | Status: DC | PRN

## 2018-08-25 MED ORDER — POTASSIUM CHLORIDE CRYS ER 20 MEQ PO TBCR: 20.0000 meq | EXTENDED_RELEASE_TABLET | Freq: Every day | ORAL | 3 refills | Status: DC | PRN

## 2018-08-25 NOTE — Progress Notes (Signed)
Cardiology Office Note  Date:  08/25/2018   ID:  Erik Perkins, DOB February 02, 1961, MRN [id number]  PCP:  [facility]   Chief Complaint  Patient presents with  . Other    Past due 12 month follow up. Patietn c/o swelling in both legs starting about a month ago.  Patient denies chest pain and SOB. Meds reviewed verbally with patient.     HPI:  Erik Perkins is a very pleasant 58 year old gentleman with a long history of  smoking,  hyperlipidemia,  hypertension  [facility] on January 21 2010 with stuttering worsening chest discomfort,  troponin elevation 0.33,  cardiac catheterization showing severe 99% distal RCA disease.  DES stent was placed . He presents for routine followup of his coronary artery disease  And new onset leg swelling  INTERVAL HISTORY: The patient reports today for follow up.  Patient reports leg swelling that started about a month ago at work while doing normal activity. He works as Librarian, academic. He stopped wearing tall compression socks because these were causing a band from compression in the middle of his calf, made it look like the swelling was worse.    he also has tingling in his feet. He reports no recent change to fluid or sodium intake and denies having DM. Takes Flomax BID. He quit smoking prior to his last MI. He denies any other complications or sick contacts at this time.   Today's Blood pressure 130/74 Prior lab work Total Chol 131/ LDL 60 No new lab work available from the [facility]  EKG personally reviewed by myself on today's visit Shows normal sinus rhythm. 62 bpm.    OTHER PAST MEDICAL HISTORY REVIEWED BY ME FOR TODAY'S VISIT:  Labwork provided from the [facility] on his last clinic visit Total cholesterol remains 135 hemoglobin A1c 5.6.   Cardiac catheterization showed 99% distal RCA disease, moderate to severe proximal RCA disease estimated at 60-70%, mild diffuse LAD and left circumflex disease, moderate ostial D2 and D3  disease, moderate distal inferior wall hypokinesis on LV gram. A Xience 3.5 x 12 mm DES stent was placed to his distal RCA.}  PMH:   has a past medical history of Coronary artery disease, Hyperlipidemia, Hypertension, and MI (myocardial infarction) (HCC).  PSH:    Past Surgical History:  Procedure Laterality Date  . CARDIAC CATHETERIZATION    . FRACTURE SURGERY Right    wrist  58yo  . VASECTOMY  1984    Current Outpatient Medications  Medication Sig Dispense Refill  . aspirin 81 MG tablet Take 81 mg by mouth daily.    . clopidogrel (PLAVIX) 75 MG tablet TAKE 1 TABLET DAILY 90 tablet 4  . metoprolol tartrate (LOPRESSOR) 25 MG tablet Take 12.5 mg by mouth 2 (two) times daily.    . nitroGLYCERIN (NITROSTAT) 0.4 MG SL tablet Place 1 tablet (0.4 mg total) under the tongue every 5 (five) minutes as needed for chest pain. 25 tablet 6  . rosuvastatin (CRESTOR) 40 MG tablet TAKE 1 TABLET DAILY 90 tablet 4  . furosemide (LASIX) 20 MG tablet Take 1 tablet (20 mg total) by mouth daily as needed. 90 tablet 3  . lisinopril (PRINIVIL,ZESTRIL) 5 MG tablet Take 1 tablet (5 mg total) by mouth daily. 90 tablet 3  . potassium chloride SA (K-DUR,KLOR-CON) 20 MEQ tablet Take 1 tablet (20 mEq total) by mouth daily as needed. 90 tablet 3  . tamsulosin (FLOMAX) 0.4 MG CAPS capsule Take 1 capsule (0.4 mg total)  by mouth 2 (two) times daily.     No current facility-administered medications for this visit.      Allergies:   Other   Social History:  The patient  reports that he quit smoking about 8 years ago. He has a 25.00 pack-year smoking history. He has never used smokeless tobacco. He reports current alcohol use of about 4.0 standard drinks of alcohol per week. He reports that he does not use drugs.   Family History:   family history includes Cancer in his father; Coronary artery disease in his sister; Diabetes in his sister; Hyperlipidemia in his father and sister.    Review of Systems: Review of  Systems  Constitutional: Negative.   Respiratory: Negative.   Cardiovascular: Negative.   Gastrointestinal: Negative.   Musculoskeletal: Negative.   Neurological: Negative.   Psychiatric/Behavioral: Negative.   All other systems reviewed and are negative.   PHYSICAL EXAM: VS:  BP 130/74 (BP Location: Left Arm, Patient Position: Sitting, Cuff Size: Normal)   Pulse 62   Ht 5\' 10"  (1.778 m)   Wt 215 lb 12 oz (97.9 kg)   BMI 30.96 kg/m  , BMI Body mass index is 30.96 kg/m. GEN: Well nourished, well developed, in no acute distress  HEENT: normal  Neck: no JVD, carotid bruits, or masses Cardiac: RRR; no murmurs, rubs, or gallops,no edema  Respiratory:  clear to auscultation bilaterally, normal work of breathing GI: soft, nontender, nondistended, + BS MS: no deformity or atrophy  Skin: warm and dry, no rash Neuro:  Strength and sensation are intact Psych: euthymic mood, full affect   Recent Labs: No results found for requested labs within last 8760 hours.    Lipid Panel No results found for: CHOL, HDL, LDLCALC, TRIG    Wt Readings from Last 3 Encounters:  08/25/18 215 lb 12 oz (97.9 kg)  01/06/17 221 lb 4 oz (100.4 kg)  12/05/15 215 lb (97.5 kg)      ASSESSMENT AND PLAN:  Atherosclerosis of native coronary artery of native heart with angina pectoris (HCC) - Plan: EKG 12-Lead Denies any anginal symptoms, no further testing at this time  Essential hypertension Blood pressure controlled, no further medication changes  Lower extremity edema Unable to exclude diastolic dysfunction and fluid retention Denies eating out a lot, denies high salt diet or high fluid intake Recommended Lasix 20 as needed with potassium 20 as needed sparingly for leg edema  Smoking hx stopped smoking many years ago.  Denies any shortness of breath symptoms Confirm today he is not smoking  Hyperlipidemia Medications renewed Lab work done through the [place] Discussed LDL goal with him  preferably 60 or less  Neuropathy Etiology unclear, nondiabetic Recommend he talk with primary care Happens daytime and nighttime, think it is from his shoes   Total encounter time more than 25 minutes  Greater than 50% was spent in counseling and coordination of care with the patient   Disposition: F/U  12 months   Orders Placed This Encounter  Procedures  . EKG 12-Lead    I, Jethro Poling am acting as a scribe for Julien Nordmann, M.D., Ph.D.  I, Julien Nordmann, M.D. Ph.D., have reviewed the above documentation for accuracy and completeness, and I agree with the above.   Signed, Dossie Arbour, M.D., Ph.D. 08/25/2018  [facility] [place], [place] [phone]

## 2018-08-25 NOTE — Telephone Encounter (Signed)
COVID-19 Pre-Screening Questions:  . Have you been in contact with someone that was recently sick with fever/cough or confirmed to have the Covid19 virus?  No  *Contact with a confirmed case should stay at home, away from confirmed patient, monitor symptoms, and reach out to PCP for e-visit/additional testing.  2. Do you have any of the following symptoms [cough, fever (100.4 or greater)], and/or shortness of breath)?  No  *ALL PTS W/ FEVER SHOULD BE REFERRED TO PCP FOR E-VISIT* ________________________________________________________________________________  Cardiac Questionnaire:    Since your last visit or hospitalization:    1. Have you been having chest pain? No   2. Have you been having shortness of breath? No   3. Have you been having increasing edema, wt gain, or increase in abdominal girth (pants fitting more tightly)? No   4. Have you had any passing out spells? No    *A YES to any of these questions would result in the appointment being kept.  *If all the answers to these questions are NO, we should indicate that given the current situation regarding the worldwide coronarvirus pandemic, at the recommendation of the CDC, we are looking to limit gatherings in our waiting area, and thus will reschedule their appointment beyond four weeks from today.    Patient did not want to reschedule at this time. Previously he was here for appointment and had to be rescheduled due to provider emergency, then had appointment which he canceled online using mychart app but it didn't cross over and he had to pay a no show fee. Based on this appointment having to be rescheduled he prefers to come in and get this done. Reviewed precautions and instructions and he confirmed with no further questions.

## 2018-08-25 NOTE — Patient Instructions (Addendum)
Goal LDL <70  Medication Instructions:  Please take lasix/furosemide with potassium as needed for leg swelling  If you need a refill on your cardiac medications before your next appointment, please call your pharmacy.   Lab work: No new labs needed   If you have labs (blood work) drawn today and your tests are completely normal, you will receive your results only by: Marland Kitchen MyChart Message (if you have MyChart) OR . A paper copy in the mail If you have any lab test that is abnormal or we need to change your treatment, we will call you to review the results.   Testing/Procedures: No new testing needed  Follow-Up: At [facility], you and your health needs are our priority.  As part of our continuing mission to provide you with exceptional heart care, we have created designated Provider Care Teams.  These Care Teams include your primary Cardiologist (physician) and Advanced Practice Providers (APPs -  Physician Assistants and Nurse Practitioners) who all work together to provide you with the care you need, when you need it.  . You will need a follow up appointment in 12 months .   Please call our office 2 months in advance to schedule this appointment.    . Providers on your designated Care Team:   . Nicolasa Ducking, NP . Eula Listen, PA-C . Marisue Ivan, PA-C  Any Other Special Instructions Will Be Listed Below (If Applicable).  For educational health videos Log in to : [email or web address] Or : [email or web address], password : [email or web address]

## 2018-08-27 NOTE — Telephone Encounter (Signed)
We will most definitely remove the no show charge for the patient.  I have sent to billing to have correct. Can you please inform the patient.

## 2018-08-27 NOTE — Telephone Encounter (Signed)
Left voicemail message that we have sent over to billing department and someone should be in touch regarding this no show fee and to call back if any further questions.

## 2018-08-31 ENCOUNTER — Telehealth: Payer: Self-pay | Admitting: *Deleted

## 2018-08-31 NOTE — Telephone Encounter (Signed)
-----   Message from Oneida Arenas sent at 08/30/2018  3:00 PM EDT ----- Elita Quick,  Can you let the patient know this was handled.  Thanks, ----- Message ----- From: Clide Dales Sent: 08/30/2018   8:06 AM EDT To: Bertram Gala, Katherene Ponto,  I'll be happy to send this for charge adjustment.  Just to let you know, the patient has paid the fee.  Bjorn Loser  ----- Message ----- From: Oneida Arenas Sent: 08/27/2018   7:35 AM EDT To: Harland German,  Can we please remove the no show charge for this patient.  It was done in error on 06/04/18.  Thanks,

## 2018-08-31 NOTE — Telephone Encounter (Signed)
Spoke with patient and reviewed that he should be getting a adjustment and to please call if any further questions. He verbalized understanding with no further questions at this time.

## 2019-01-17 ENCOUNTER — Emergency Department
Admission: EM | Admit: 2019-01-17 | Discharge: 2019-01-17 | Disposition: A | Discharge status: Home / Self Care | Attending: Student | Admitting: Student

## 2019-01-17 ENCOUNTER — Other Ambulatory Visit: Payer: Self-pay

## 2019-01-17 ENCOUNTER — Encounter: Payer: Self-pay | Admitting: Emergency Medicine

## 2019-01-17 ENCOUNTER — Emergency Department

## 2019-01-17 DIAGNOSIS — Z79899 Other long term (current) drug therapy: Secondary | ICD-10-CM | POA: Insufficient documentation

## 2019-01-17 DIAGNOSIS — Z87891 Personal history of nicotine dependence: Secondary | ICD-10-CM | POA: Insufficient documentation

## 2019-01-17 DIAGNOSIS — I259 Chronic ischemic heart disease, unspecified: Secondary | ICD-10-CM | POA: Diagnosis not present

## 2019-01-17 DIAGNOSIS — I10 Essential (primary) hypertension: Secondary | ICD-10-CM | POA: Diagnosis not present

## 2019-01-17 DIAGNOSIS — Z7902 Long term (current) use of antithrombotics/antiplatelets: Secondary | ICD-10-CM | POA: Insufficient documentation

## 2019-01-17 DIAGNOSIS — R0789 Other chest pain: Secondary | ICD-10-CM | POA: Insufficient documentation

## 2019-01-17 DIAGNOSIS — Z955 Presence of coronary angioplasty implant and graft: Secondary | ICD-10-CM | POA: Insufficient documentation

## 2019-01-17 DIAGNOSIS — Z7982 Long term (current) use of aspirin: Secondary | ICD-10-CM | POA: Insufficient documentation

## 2019-01-17 LAB — BASIC METABOLIC PANEL WITH GFR
Anion gap: 10 (ref 5–15)
BUN: 20 mg/dL (ref 6–20)
CO2: 23 mmol/L (ref 22–32)
Calcium: 9.3 mg/dL (ref 8.9–10.3)
Chloride: 106 mmol/L (ref 98–111)
Creatinine, Ser: 1.21 mg/dL (ref 0.61–1.24)
GFR calc Af Amer: >60 mL/min
GFR calc non Af Amer: >60 mL/min
Glucose, Bld: 111 mg/dL — ABNORMAL HIGH (ref 70–99)
Potassium: 3.7 mmol/L (ref 3.5–5.1)
Sodium: 139 mmol/L (ref 135–145)

## 2019-01-17 LAB — CBC
HCT: 41.4 % (ref 39.0–52.0)
Hemoglobin: 14.2 g/dL (ref 13.0–17.0)
MCH: 31 pg (ref 26.0–34.0)
MCHC: 34.3 g/dL (ref 30.0–36.0)
MCV: 90.4 fL (ref 80.0–100.0)
Platelets: 222 10*3/uL (ref 150–400)
RBC: 4.58 MIL/uL (ref 4.22–5.81)
RDW: 12.8 % (ref 11.5–15.5)
WBC: 10.4 10*3/uL (ref 4.0–10.5)
nRBC: 0 % (ref 0.0–0.2)

## 2019-01-17 LAB — TROPONIN I (HIGH SENSITIVITY)
Troponin I (High Sensitivity): 3 ng/L
Troponin I (High Sensitivity): 4 ng/L (ref <18)

## 2019-01-17 MED ORDER — NITROGLYCERIN 0.4 MG SL SUBL: 0.4000 mg | SUBLINGUAL_TABLET | SUBLINGUAL | 6 refills | Status: DC | PRN

## 2019-01-17 MED ORDER — ASPIRIN 81 MG PO CHEW
324.0000 mg | CHEWABLE_TABLET | Freq: Once | ORAL | Status: AC
  Administered 2019-01-17: 324 mg via ORAL
  Filled 2019-01-17: qty 4

## 2019-01-17 MED ORDER — SODIUM CHLORIDE 0.9% FLUSH: 3.0000 mL | Freq: Once | INTRAVENOUS | Status: DC

## 2019-01-17 MED ORDER — NITROGLYCERIN 0.4 MG SL SUBL: 0.4000 mg | SUBLINGUAL_TABLET | SUBLINGUAL | 0 refills | Status: DC | PRN

## 2019-01-17 NOTE — ED Notes (Signed)
Pt to Xray and returned to room in NAD. Wife at bedside.

## 2019-01-17 NOTE — ED Triage Notes (Signed)
C/O left sided chest pain since around 1030 this morning.  Nothing makes pain better or worse.    Has history of 2 MI in the past.  Last in 2011 with stent placement.

## 2019-01-17 NOTE — Discharge Instructions (Addendum)
Thank you for letting us take care of you in the emergency department today.   Please continue to take your regular, prescribed medications.   Please follow up with: - Your cardiologist - Your primary care doctor - there was a small nodule seen on your left lung on your x-ray, so your doctor will likely want to follow this up as an outpatient with another scan.  Please return to the ER for any new or worsening symptoms.

## 2019-01-17 NOTE — ED Provider Notes (Signed)
[facility] Emergency Department Provider Note  ____________________________________________   First MD Initiated Contact with Patient 01/17/19 1828     (approximate)  I have reviewed the triage vital signs and the nursing notes.  History  Chief Complaint Chest Pain    HPI Erik Perkins is a 58 y.o. male with history of CAD s/p stenting who presents to the ED for chest discomfort. Patient reports left sided chest discomfort starting at around 10 this morning. Nothing makes it better or worse. Currently mild in severity. He describes it as a twinge like sensation. He denies any other associated symptoms, including SOB, nausea, diaphoresis. Pain does not radiate at all. No recent illness or infection. No change in activity level. No fevers or cough. No rashes. No hx of DVT or PE. No leg swelling or recent travel. This does not feel like his past MI. He states he presented out of an abundance of caution due to his medical history.          Past Medical Hx Past Medical History:  Diagnosis Date  . Coronary artery disease    a. 01/2010 NSTEMI/PCI: Mild diff LDA/LCX dzs, moderate ostial D2/3 dzs. RCA 60-70p, 99d (3.5x12 Xience DES).  . Hyperlipidemia   . Hypertension   . MI (myocardial infarction) ([place])    10/2004, 01/2010    Problem List Patient Active Problem List   Diagnosis Date Noted  . Atherosclerosis of native coronary artery of native heart with stable angina pectoris ([place]) 01/05/2017  . Essential hypertension 01/05/2017  . Stented coronary artery 08/08/2011  . Smoking hx 08/08/2011  . Hyperlipidemia 01/30/2010  . Coronary atherosclerosis 01/30/2010    Past Surgical Hx Past Surgical History:  Procedure Laterality Date  . CARDIAC CATHETERIZATION    . FRACTURE SURGERY Right    wrist  58yo  . VASECTOMY  1984    Medications Prior to Admission medications   Medication Sig Start Date End Date Taking? Authorizing Provider  aspirin 81 MG  tablet Take 81 mg by mouth daily.    [provider]  clopidogrel (PLAVIX) 75 MG tablet TAKE 1 TABLET DAILY 07/14/18   Minna Merritts, MD  furosemide (LASIX) 20 MG tablet Take 1 tablet (20 mg total) by mouth daily as needed. 08/25/18   Minna Merritts, MD  lisinopril (PRINIVIL,ZESTRIL) 5 MG tablet Take 1 tablet (5 mg total) by mouth daily. 08/25/18   Minna Merritts, MD  metoprolol tartrate (LOPRESSOR) 25 MG tablet Take 12.5 mg by mouth 2 (two) times daily.    [provider]  nitroGLYCERIN (NITROSTAT) 0.4 MG SL tablet Place 1 tablet (0.4 mg total) under the tongue every 5 (five) minutes as needed for chest pain. 12/05/15   Minna Merritts, MD  potassium chloride SA (K-DUR,KLOR-CON) 20 MEQ tablet Take 1 tablet (20 mEq total) by mouth daily as needed. 08/25/18   Minna Merritts, MD  rosuvastatin (CRESTOR) 40 MG tablet TAKE 1 TABLET DAILY 07/14/18   Minna Merritts, MD  tamsulosin (FLOMAX) 0.4 MG CAPS capsule Take 1 capsule (0.4 mg total) by mouth 2 (two) times daily. 08/25/18   Minna Merritts, MD    Allergies Other  Family Hx Family History  Problem Relation Age of Onset  . Coronary artery disease Sister        stents placed  . Diabetes Sister   . Hyperlipidemia Sister   . Cancer Father        lung  . Hyperlipidemia Father  Social Hx Social History   Tobacco Use  . Smoking status: Former Smoker    Packs/day: 1.00    Years: 25.00    Pack years: 25.00    Quit date: 01/01/2010    Years since quitting: 9.0  . Smokeless tobacco: Never Used  Substance Use Topics  . Alcohol use: Yes    Alcohol/week: 4.0 standard drinks    Types: 2 Cans of beer, 2 Standard drinks or equivalent per week  . Drug use: No     Review of Systems  Constitutional: Negative for fever. Negative for chills. Eyes: Negative for visual changes. ENT: Negative for sore throat. Cardiovascular: +f or chest pain. Respiratory: Negative for shortness of breath. Gastrointestinal:  Negative for abdominal pain. Negative for nausea. Negative for vomiting. Genitourinary: Negative for dysuria. Musculoskeletal: Negative for leg swelling. Skin: Negative for rash. Neurological: Negative for for headaches.   Physical Exam  Vital Signs: ED Triage Vitals  Enc Vitals Group     BP 01/17/19 1822 139/82     Pulse Rate 01/17/19 1822 74     Resp 01/17/19 1822 16     Temp 01/17/19 1822 (!) 97.2 F (36.2 C)     Temp Source 01/17/19 1822 Oral     SpO2 01/17/19 1822 97 %     Weight 01/17/19 1823 215 lb (97.5 kg)     Height 01/17/19 1823 5\' 11"  (1.803 m)     Head Circumference --      Peak Flow --      Pain Score 01/17/19 1823 4     Pain Loc --      Pain Edu? --      Excl. in GC? --     Constitutional: Alert and oriented.  Eyes: Conjunctivae clear. Sclera anicteric. Head: Normocephalic. Atraumatic. Nose: No congestion. No rhinorrhea. Mouth/Throat: Mucous membranes are moist.  Neck: No stridor.   Cardiovascular: Normal rate, regular rhythm. No murmurs. Extremities well perfused. Respiratory: Normal respiratory effort.  Lungs CTAB. Gastrointestinal: Soft and non-tender. No distention.  Musculoskeletal: No lower extremity edema. Neurologic:  Normal speech and language. No gross focal neurologic deficits are appreciated.  Skin: Skin is warm, dry and intact. No rash noted. Psychiatric: Mood and affect are appropriate for situation.  EKG  Personally reviewed.   Rate: 72 Rhythm: sinus Axis: normal Intervals: within normal limits No acute ST or T wave changes No acute ischemic changes   Radiology  XR: no opacities, small nodule on L   Procedures  Procedure(s) performed (including critical care):  Procedures   Initial Impression / Assessment and Plan / ED Course  58 y.o. male with history of CAD s/p stenting who presents to the ED for atypical chest pain as described above.   Exam is benign.   Ddx includes ACS, MSK, costochondritis. No hypoxia,  tachypnea, tachycardia that is suggestive of PE. No recent illness that is suggestive of pericarditis. No widened mediastinum, associated neuro symptoms, or ripping/tearing pain to suggest dissection.    EKG w/o acute ischemic changes. Troponin x 2 within normal limits. Remainder of labs unremarkable. XR with small lung nodule, otherwise no acute findings (updated patient on this and need for follow up with PCP for likely further imaging). HEART score low risk - 3 (for age, risk factors). Patient desires and feels comfortable with discharge with close cardiology follow up. Provided refill for his nitro as needed. Discussed strict return precautions.     Final Clinical Impression(s) / ED Diagnosis  Final diagnoses:  Atypical  chest pain       Note:  This document was prepared using Dragon voice recognition software and may include unintentional dictation errors.   Miguel AschoffMonks, Novaleigh Kohlman L., MD 01/17/19 2352

## 2019-06-28 NOTE — Nursing Note (Signed)
Adult Admission Assessment - Text       Perioperative Admission Assessment Entered On:  06/28/2019 11:27 EST    Performed On:  06/28/2019 11:25 EST by PYLE, RN, JOYCE               Problem History   (As Of: 06/28/2019 11:28:00 EST)   Problems(Active)    Back pain (SNOMED CT  :578469629 )  Name of Problem:   Back pain ; Recorder:   PYLE, RN, JOYCE; Confirmation:   Confirmed ; Classification:   Patient Stated ; Code:   [id number] ; Contributor System:   Dietitian ; Last Updated:   06/28/2019 11:25 EST ; Life Cycle Date:   06/28/2019 ; Life Cycle Status:   Active ; Vocabulary:   SNOMED CT        OSA (obstructive sleep apnea) (SNOMED CT  :010272536 )  Name of Problem:   OSA (obstructive sleep apnea) ; Recorder:   PYLE, RN, JOYCE; Confirmation:   Confirmed ; Classification:   Patient Stated ; Code:   [id number] ; Contributor System:   PowerChart ; Last Updated:   06/28/2019 11:25 EST ; Life Cycle Date:   06/28/2019 ; Life Cycle Status:   Active ; Vocabulary:   SNOMED CT        Sciatica (SNOMED CT  :59563875 )  Name of Problem:   Sciatica ; Recorder:   PYLE, RN, JOYCE; Confirmation:   Confirmed ; Classification:   Patient Stated ; Code:   [id number] ; Contributor System:   PowerChart ; Last Updated:   06/28/2019 11:25 EST ; Life Cycle Date:   06/28/2019 ; Life Cycle Status:   Active ; Vocabulary:   SNOMED CT          Procedure History        -    Procedure History   (As Of: 06/28/2019 11:28:00 EST)     Anesthesia Minutes:   0 ; Procedure Name:   Fusion of lumbar spine ; Procedure Minutes:   0 ; Last Reviewed Dt/Tm:   06/28/2019 11:25:47 EST            Anesthesia Minutes:   0 ; Procedure Name:   Hernia ; Procedure Minutes:   0 ; Last Reviewed Dt/Tm:   06/28/2019 11:25:57 EST            Anesthesia Minutes:   0 ; Procedure Name:   Tonsillectomy ; Procedure Minutes:   0 ; Last Reviewed Dt/Tm:   06/28/2019 11:27:50 EST

## 2019-06-29 NOTE — Procedures (Signed)
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  Patient understood and wished to proceed.  A signed consent was obtained.  Patient was placed in a prone position in the fluoroscopy room.  The lumbar area was prepped and draped in the usual sterile fashion and sterility was maintained throughout the procedure.  The levels were identified under fluoroscopy and the skin overlying the levels were anesthetized with 3 cc of 1% lidocaine plain.  A 25-gauge 3-1/2 inch spinal needle was passed through the skin well and advanced in a ventral direction until the tip of the needle was properly placed in the superior posterior intervertebral foramen as confirmed by AP and lateral fluoroscopic views.  No blood was aspirated.  There was no CSF flow.  Following negative aspiration 1 mL of contrast dye was injected to produce an epidurogram.  Afterwards 1 mL of solution was injected at each level.  Each milliliter contained [10] milligrams of dexamethasone.  Afterwards the needles were removed and a dressing was applied to the site.  Patient was taken to the recovery room and monitored for 30 minutes and was discharged home in stable condition.  Patient was given a follow-up appointment.  Patient tolerated the procedure well.        Signature Line     Electronically Signed on 06/29/2019 11:27 AM EST   ________________________________________________   Anda Latina

## 2019-06-29 NOTE — Assessment & Plan Note (Signed)
Pre-Procedure Record - [facility]             Pre-Procedure Record - SFPM Summary                                                             Primary Physician:        Denton Brick    Case Number:              [id number]    Finalized Date/Time:      06/29/19 11:04:34    Pt. Name:                 DEMBA, NIGH    D.O.B./Sex:               [birth year]    Male    Med Rec #:                [id number]    Physician:                Denton Brick    Financial #:              [id number]    Pt. Type:                 O    Room/Bed:                 /    Admit/Disch:              06/29/19 10:28:00 -    Institution:       [facility] - Pre-Procedure - Case Times                                                                                         Entry 1                                                                                                          Patient In Room Time      06/29/19 10:48:00               Nurse In Time                   06/29/19 10:49:00    Nurse Out Time            06/29/19 11:01:00               Patient Ready for  06/29/19 11:01:00                                                              Surgery/Procedure     Last Modified By:         Ballard Russell RN, MICHELE L                              06/29/19 11:04:32      SFPM - Pre-Procedure - Case Times Audit                                                          06/29/19 11:04:32         Owner: [id number]                              Modifier: [id number]                                                       <+> 1         Patient Ready for Surgery/Procedure        <+> 1         Nurse Out Time                Finalized By: Ballard Russell RN, Rudene Christians      Document Signatures                                                                             Signed By:           Ballard Russell RN, MICHELE L 06/29/19 11:04

## 2019-06-29 NOTE — Procedures (Signed)
Procedure Record - SFPM             Procedure Record - SFPM Summary                                                                 Primary Physician:        Denton Brick    Case Number:              [id number]    Finalized Date/Time:      06/29/19 11:34:21    Pt. Name:                 Jesse Savage, Jesse Savage    D.O.B./Sex:               [birth year]    Male    Med Rec #:                [id number]    Physician:                Denton Brick    Financial #:              [id number]    Pt. Type:                 O    Room/Bed:                 /    Admit/Disch:              06/29/19 10:28:00 -    Institution:       HYQM - Case Attendance                                                                                                    Entry 1                         Entry 2                         Entry 3                                          Case Attendee             RANDALL-MD,  DERRICK Rande Brunt, RN, Lattie Haw, RN, [place]    Role Performed            Surgeon Primary                 Circulator  Radiology Tech    Time In                   06/29/19 11:26:00               06/29/19 11:26:00               06/29/19 11:26:00    Time Out                  06/29/19 11:34:00               06/29/19 11:34:00               06/29/19 11:34:00    Procedure                 Transforaminal Epidural         Transforaminal Epidural         Transforaminal Epidural                              Ster Inj                        Ster Inj                        Ster Inj    Last Modified By:         [name] RN, Hortencia Conradi, RN, Hortencia Conradi, RN, [name] A                              06/29/19 11:34:18               06/29/19 11:34:18               06/29/19 11:34:18      SFPM - Case Attendance Audit                                                                     06/29/19 11:34:18         Owner: Renee Rival                             Modifier: [email or web address]                                                           1     <+> Time Out            1     <*> Procedure                              Transforaminal Epidural Ster Inj  2     <+> Time In            2     <+> Time Out            2     <*> Procedure                              Transforaminal Epidural Ster Inj            3     <+> Time In            3     <+> Time Out            3     <*> Procedure                              Transforaminal Epidural Ster Inj     06/29/19 11:31:48         Owner: Renee Rival                             Modifier: [email or web address]                                                          1     <+> Time In            1     <*> Procedure                              Transforaminal Epidural Ster Inj        <+> 2         Case Attendee        <+> 2         Role Performed        <+> 2         Procedure        <+> 3         Case Attendee        <+> 3         Role Performed        <+> 3         Procedure        SFPM - Case Times                                                                                                         Entry 1  Patient      In Room Time             06/29/19 11:26:00               Out Room Time                   06/29/19 11:34:00    Anesthesia     Procedure      Start Time               06/29/19 11:32:00               Stop Time                       06/29/19 11:34:00    Last Modified By:         Delma Freeze, [name] A                              06/29/19 11:34:16      SFPM - Case Times Audit                                                                          06/29/19 11:34:16         Owner: Renee Rival                             Modifier: [email or web address]                                                      <+> 1         Out Room Time     06/29/19 11:34:11         Owner: Renee Rival                             Modifier: [email or web address]                                                      <+> 1          Stop Time     06/29/19 11:34:02         Owner: Renee Rival                             Modifier: Renee Rival                                                      <+> 1         Start Time  SFPM - General Case Data                                                                                                  Entry 1                                                                                                          Case Information      ASA Class                N/A                             Case Level                      None     OR                       SF PM 01                        Specialty                       Pain Management (SN)     Wound Class              1-Clean    Preop Diagnosis           M51.36                          Postop Diagnosis                m51.36    Last Modified By:         [name], RN, [name] A                              06/29/19 11:32:06      SFPM - Procedures  Entry 1                                                                                                          Procedure     Description      Procedure                Transforaminal Epidural         Surgical Procedure              SNRB L3-L4                              Ster Inj                        Text     Primary Procedure         Yes                             Primary Surgeon                 Anda Latina    Start                     06/29/19 11:32:00               Stop                            06/29/19 11:34:00    Anesthesia Type           Local                           Surgical Service                Pain Management (SN)    Wound Class               1-Clean    Last Modified By:         [name], RN, [name] A                              06/29/19 11:34:13      SFPM - Dressing/Packing                                                                                                   Entry 1  Site                      Back    Dressing Item     Details      Dressing Item            Band-Aid     (Im.290)     Last Modified By:         Gertie BaronKIERSPE, RN, [name] A                              06/29/19 11:31:57      SFPM - Procedure Setup                                                                                                    Entry 1                                                                                                          Body Position             Prone                           Prep Agents (Im.270)            Chlorhexidine Gluconate                                                                                              2% w/Alcohol    Skin Prep Agent Dry       Yes                             Equipment Used                  O2 Sat    Without Pooling     Vital signs               Yes    completed and     patient reassessed     prior to sedation     Last Modified By:         Gertie BaronKIERSPE, RN, [name] A  06/29/19 11:32:14      SFPM - Time Out - Procedure                                                                                               Entry 1                                                                                                          Procedure                 Transforaminal Epidural         Patient name and                Yes                              Ster Inj                        DOB confirmed     Surgical procedure        Yes                             Correct surgical                Yes    to be performed                                           site marked and     confirmed and                                             initials are     verified by                                               visible through     completed surgical                                        prepped and draped     consent  field (or                                                               alternative ID band                                                               used), if applicable     Allergies discussed       Yes                             Anticoagulation                 Yes                                                              status confirmed     Time Out Complete         06/29/19 11:27:00    Last Modified By:         Gertie BaronKIERSPE, RN, [name] A                              06/29/19 11:27:55      SFPM - Debrief - Procedure                                                                                                Entry 1                                                                                                          Procedure                 Transforaminal Epidural         Actual procedure                Yes  Ster Inj                        performed confirmed     Confirm specimens         Yes                             Patient recovery                Yes    and specimens                                             plan confirmed     labeled     appropriately (if     applicable)     Debrief Complete          06/29/19 11:34:00    Last Modified By:         [name] RN, [name] A                              06/29/19 11:34:09      Case Comments                                                                                         <None>              Finalized By: [name] RN, [name] A      Document Signatures                                                                             Signed By:           [name] RN, [name] A 06/29/19 11:34

## 2019-06-29 NOTE — Nursing Note (Signed)
Nursing Discharge Summary - Text       Nursing Discharge Summary Entered On:  06/29/2019 10:56 EST    Performed On:  06/29/2019 10:56 EST by [name], RN, Solmon Ice               DC Information   Discharge To :   Home independently   Mode of Discharge :   Ambulatory   Darlys Gales A - 06/29/2019 10:56 EST

## 2019-06-29 NOTE — H&P (Signed)
History & Physical  Pre-Procedure        Patient:   RYE, DECOSTE            MRN: [id number]            FIN: [id number]               Age:   59 years     Sex:  Male     DOB:  1960/08/13   Associated Diagnoses:   None   Author:   Ellwood Sayers,  Andrue Dini W      DX: Degenerative disc disease with left radiculopathy    PROCEDURE: Selective nerve root block left L3-L4      Patient is here for a therapeutic pain procedure with fluoroscopy.    Vitals: See nursing notes    Mental status: Alert and oriented x3    Cardiovascular: Regular rate and rhythm    Lungs: Clear to auscultation bilaterally    Neuro: Nonfocal    Assessment:  Patient here for therapeutic pain procedure.     Plan:  Will plan to proceed with scheduled pain procedure        Signature Line     Electronically Signed on 06/29/2019 11:27 AM EST   ________________________________________________   Anda Latina

## 2019-06-29 NOTE — Op Note (Signed)
Phase II Record - SFPM             Phase II Record - SFPM Summary                                                                  Primary Physician:        Anda Latina    Case Number:              [id number]    Finalized Date/Time:      06/29/19 11:44:35    Pt. Name:                 Jesse Savage, Jesse Savage    D.O.B./Sex:               Apr 02, 1961    Male    Med Rec #:                [id number]    Physician:                Anda Latina    Financial #:              [id number]    Pt. Type:                 O    Room/Bed:                 /    Admit/Disch:              06/29/19 10:28:00 -    Institution:       SFPM Case Time Phase II                                                                                                   Entry 1                                                                                                          Phase II In               06/29/19 11:36:00               Phase II Out                    06/29/19 11:44:00    Phase II Discharge        06/29/19 11:44:00  Time     Last Modified By:         [name], RN, [name] A                              06/29/19 11:44:34              Finalized By: [name] RN, Solmon Ice      Document Signatures                                                                             Signed By:           [name] RN, [name] A 06/29/19 11:44

## 2019-08-02 ENCOUNTER — Telehealth: Payer: Self-pay | Admitting: Cardiovascular Disease

## 2019-08-02 ENCOUNTER — Other Ambulatory Visit: Payer: Self-pay | Admitting: Cardiovascular Disease

## 2019-08-02 NOTE — Telephone Encounter (Signed)
Returned call to [facility] to clarify that "other allergy" means it is not in our formulary. The medication is Cephadrine.  No further questions at this time.

## 2019-08-02 NOTE — Telephone Encounter (Signed)
[facility] calling to confirm what Other allergy means please call  Reference #[id number]

## 2019-08-23 NOTE — Nursing Note (Signed)
Adult Admission Assessment - Text       Perioperative Admission Assessment Entered On:  08/23/2019 14:31 EDT    Performed On:  08/23/2019 14:31 EDT by Kae Heller E               PAT Patient Instructions   Additional Contacts PAT :   covid 3/18   Assunta Found - 08/23/2019 14:31 EDT

## 2019-08-25 LAB — CBC
Hematocrit: 44.2 % (ref 38.0–52.0)
Hemoglobin: 14.4 g/dL (ref 13.0–17.3)
MCH: 29.7 pg (ref 27.0–34.5)
MCHC: 32.6 g/dL (ref 32.0–36.0)
MCV: 91.1 fL (ref 84.0–100.0)
MPV: 10.2 fL (ref 7.2–13.2)
NRBC Absolute: 0 10*3/uL (ref 0.000–0.012)
NRBC Automated: 0 % (ref 0.0–0.2)
Platelets: 309 10*3/uL (ref 140–440)
RBC: 4.85 x10e6/mcL (ref 4.00–5.60)
RDW: 12.3 % (ref 11.0–16.0)
WBC: 11 10*3/uL — ABNORMAL HIGH (ref 3.8–10.6)

## 2019-08-25 LAB — COVID-19: SARS-CoV-2: NOT DETECTED

## 2019-08-25 NOTE — Anesthesia Pre-Procedure Evaluation (Signed)
Preop clinic        Patient:   Jesse Savage, Jesse Savage            MRN: [id number]            FIN: [id number]               Age:   59 years     Sex:  Male     DOB:  [birth year]   Associated Diagnoses:   None   Author:   Demetrius Revel LYNN-FNP      Preoperative Information   Procedure/ Case: ACDF   Surgeon scheduled: WORTHINGTON,  CURTIS-MD   Anesthesia history     Patient's history: negative.     Family's history: negative.        History of Present Illness   ACDF on 3/19      Review of Systems   General:  able to do chores and ADLs.    Metabolic Equivalents in Exercise Testing:  4-7.    Respiratory:       Sleep apnea: Unable to tolerate CPAP device.    Cardiovascular:  Negative.    Musculoskeletal:       Back pain: In the upper region, The pain is moderate.       Health Status   Allergies:    Allergic Reactions (All)  Unknown  Codeine- Na.  Cymbalta- Na.  Neurontin- Na.   Current medications:    Home Medications (4) Active  amitriptyline 25 mg oral tablet , Oral, Once a Day (at bedtime)  cyclobenzaprine 10 mg oral tablet 10 mg = 1 tabs, PRN, Oral, TID  Percocet 5/325 oral tablet 1-2 tabs, PRN, Oral, q6hr  ZyrTEC 10 mg, PRN, Oral, Daily  ,    No qualifying data available     Problem list:    Active Problems (4)  Acute myelopathy   Back pain   OSA (obstructive sleep apnea)   Sciatica         Histories   Past Medical History:    No active or resolved past medical history items have been selected or recorded.   Procedure history:    Transforaminal Epidural Ster Inj on 06/29/2019 at 58 Years.  Comments:  06/29/2019 11:34 EST - [name], RN, [name] A  auto-populated from documented surgical case  Lumbar fusion L4-5 on 04/22/2011 at 50 Years.  redo lumbar fusion L5-S1 in 2012 at 50 Years.  L5-S1 lumbar fusion in 2005 at 43 Years.  thumb surgery in 1995 at 33 Years.  facial surgery in 1981 at 19 Years.  tonsils and adenoids in 1972 at 10 Years.  Hernia ([id number]).  Tonsillectomy ([id number]).  colonoscopy.   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        Physical Examination      Vital Signs (last 24 hrs)_____  Last Charted___________  Heart Rate Peripheral   76 bpm  (MAR 18 09:21)  SBP      128 mmHg  (MAR 18 09:21)  DBP      82 mmHg  (MAR 18 09:21)  SpO2      98 %  (MAR 18 09:21)  Weight      78.2 kg  (MAR 18 09:21)  Height      172.7 cm  (MAR 18 09:21)  BMI      26.22  (MAR 18 09:34)     General:          Stress: No acute distress.         Appearance: Within normal limits.    Airway:          Mallampati classification: III (soft palate, base of uvula visible).         Mouth: Teeth ( poor dentition, temporary bridge in place ).    Respiratory:  Lungs are clear to auscultation.    Cardiovascular:  Regular rhythm.    Neurologic:  Alert, Oriented.       Review / Management   Results review:     No qualifying data available, patient is optimized for surgery, pending labs and COVID test done today.    Signature Line     Electronically Signed on 08/25/2019 09:52 AM EDT   ________________________________________________   Demetrius Revel LYNN-FNP               Modified by: Demetrius Revel LYNN-FNP on 08/25/2019 09:40 AM EDT      Modified by: Demetrius Revel LYNN-FNP on 08/25/2019 09:47 AM EDT      Modified by: Demetrius Revel LYNN-FNP on 08/25/2019 09:52 AM EDT

## 2019-08-25 NOTE — Nursing Note (Signed)
 Adult Admission Assessment - Text       Perioperative Admission Assessment Entered On:  08/25/2019 9:23 EDT    Performed On:  08/25/2019 9:22 EDT by CLAUDENE CHARMAINE BRAVO               General   SN - Preop Clinic Visit :   Preop Clinic Visit   Information Given By :   Self   Primary Care Physician/Specialists :   PCP - DR. CLARE   Emergency Contact Name :   MURICE BARBAR   Emergency Contact Phone :   [phone]   Languages :   Isadora   Preferred Communication Mode :   Verbal, Written   CLAUDENE,  CHARMAINE E - 08/25/2019 9:22 EDT   Allergies   (As Of: 08/26/2019 06:59:30 EDT)   Allergies (Active)   codeine  Estimated Onset Date:   Unspecified ; Reactions:   NA ; Created By:   CLARISE LOT D; Reaction Status:   Active ; Category:   Drug ; Substance:   codeine ; Type:   Allergy ; Severity:   Unknown ; Updated By:   CLARISE LOT D; Reviewed Date:   08/26/2019 6:55 EDT      Cymbalta  Estimated Onset Date:   Unspecified ; Reactions:   NA ; Created By:   CLARISE LOT D; Reaction Status:   Active ; Category:   Drug ; Substance:   Cymbalta ; Type:   Allergy ; Severity:   Unknown ; Updated By:   CLARISE LOT D; Reviewed Date:   08/26/2019 6:55 EDT      Neurontin  Estimated Onset Date:   Unspecified ; Reactions:   NA ; Created By:   CLARISE LOT D; Reaction Status:   Active ; Category:   Drug ; Substance:   Neurontin ; Type:   Allergy ; Severity:   Unknown ; Updated By:   MCMURRAY,  NATHAN D; Reviewed Date:   08/26/2019 6:55 EDT        Medication History   Medication List   (As Of: 08/26/2019 06:59:30 EDT)   Normal Order    Lactated Ringers  Injection solution 1,000 mL  :   Lactated Ringers  Injection solution 1,000 mL ; Status:   Ordered ; Ordered As Mnemonic:   Lactated Ringers  Injection 1,000 mL ; Simple Display Line:   40 mL/hr, IV ; Ordering Provider:   VIKI,  CURTIS-MD; Catalog Code:   Lactated Ringers  Injection ; Order Dt/Tm:   08/26/2019 06:23:11 EDT ; Comment:   Perioperative use ONLY  For Non Dialysis  Patient          sodium chloride  0.9% Inj Soln 10 mL syringe  :   sodium chloride  0.9% Inj Soln 10 mL syringe ; Status:   Ordered ; Ordered As Mnemonic:   sodium chloride  0.9% flush syringe range dose ; Simple Display Line:   30 mL, IV Push, q8hr ; Ordering Provider:   VIKI,  CURTIS-MD; Catalog Code:   sodium chloride  flush ; Order Dt/Tm:   08/26/2019 06:23:36 EDT          A Patient Specific Medication  :   A Patient Specific Medication ; Status:   Ordered ; Ordered As Mnemonic:   A Patient Specific Medication ; Simple Display Line:   1 EA, Kit-Combo, q23min, PRN: other (see comment) ; Ordering Provider:   VIKI,  CURTIS-MD; Catalog Code:   A Patient Specific Medication ; Order Dt/Tm:  08/26/2019 06:23:36 EDT          A Patient Specific Refrigerated Medication  :   A Patient Specific Refrigerated Medication ; Status:   Ordered ; Ordered As Mnemonic:   A Patient Specific Refrigerated Medication ; Simple Display Line:   1 EA, Kit-Combo, q5min, PRN: other (see comment) ; Ordering Provider:   VIKI,  CURTIS-MD; Catalog Code:   A Patient Specific Refrigerated Medicati ; Order Dt/Tm:   08/26/2019 06:23:36 EDT ; Comment:   to access the patient specific Refrigerated medications          Delivery and Return [place] Access  :   Delivery and Return [place] Access ; Status:   Ordered ; Ordered As Mnemonic:   Delivery and Return Bin Access ; Simple Display Line:   1 EA, Kit-Combo, q70min, PRN: other (see comment) ; Ordering Provider:   VIKI FRIEDMAN; Catalog Code:   Delivery and Return Bin Access ; Order Dt/Tm:   08/26/2019 06:23:36 EDT ; Comment:   This code grants access to the [employer] for the Delivery and Return Bin Access          lidocaine  1% PF Inj Soln 2 mL  :   lidocaine  1% PF Inj Soln 2 mL ; Status:   Ordered ; Ordered As Mnemonic:   lidocaine  1% preservative-free injectable solution ; Simple Display Line:   0.25 mL, ID, q5min, PRN: other (see comment) ; Ordering Provider:    VIKI,  CURTIS-MD; Catalog Code:   lidocaine  ; Order Dt/Tm:   08/26/2019 06:23:36 EDT ; Comment:   to access lidocaine  1%  2 mL vial for IV start and Life Port access          lidocaine  2% Topical Gel with applicator 10-11 mL  :   lidocaine  2% Topical Gel with applicator 10-11 mL ; Status:   Ordered ; Ordered As Mnemonic:   Uro-Jet 2% topical gel with applicator ; Simple Display Line:   1 app, Topical, q5min, PRN: other (see comment) ; Ordering Provider:   VIKI,  CURTIS-MD; Catalog Code:   lidocaine  topical ; Order Dt/Tm:   08/26/2019 06:23:36 EDT          Respiratory MDI Treatment  :   Respiratory MDI Treatment ; Status:   Ordered ; Ordered As Mnemonic:   Respiratory MDI Treatment ; Simple Display Line:   1 EA, Kit-Combo, q5min, PRN: other (see comment) ; Ordering Provider:   VIKI,  CURTIS-MD; Catalog Code:   Respiratory MDI Treatment ; Order Dt/Tm:   08/26/2019 06:23:36 EDT          sodium chloride  0.9% Inj Soln 10 mL syringe  :   sodium chloride  0.9% Inj Soln 10 mL syringe ; Status:   Ordered ; Ordered As Mnemonic:   sodium chloride  0.9% flush syringe range dose ; Simple Display Line:   30 mL, IV Push, q5min, PRN: other (see comment) ; Ordering Provider:   VIKI,  CURTIS-MD; Catalog Code:   sodium chloride  flush ; Order Dt/Tm:   08/26/2019 06:23:36 EDT          sodium chloride  0.9% Inj Soln 10 mL vial PF  :   sodium chloride  0.9% Inj Soln 10 mL vial PF ; Status:   Ordered ; Ordered As Mnemonic:   sodium chloride  0.9% vial for reconstitution range dose ; Simple Display Line:   30 mL, IV Push, q5min, PRN: other (see comment) ; Ordering Provider:   VIKI,  CURTIS-MD; Catalog Code:  sodium chloride  flush ; Order Dt/Tm:   08/26/2019 06:23:36 EDT ; Comment:   for access to sodium chloride  0.9% vial when needed as a diluent  for reconstitutable medications          sterile water Inj Soln 10 mL  :   sterile water Inj Soln 10 mL ; Status:   Ordered ; Ordered As Mnemonic:   sterile water for  reconstitution ; Simple Display Line:   10 mL, N/A, q5min, PRN: other (see comment) ; Ordering Provider:   VIKI,  CURTIS-MD; Catalog Code:   sterile water for reconstitution ; Order Dt/Tm:   08/26/2019 06:23:36 EDT ; Comment:   Access sterile water when needed as a diluent  for reconstitutable medications. Not for IV use.          ceFAZolin  2 g/100 ml NS  :   ceFAZolin  2 g/100 ml NS ; Status:   Ordered ; Ordered As Mnemonic:   Ancef  IVPB ; Simple Display Line:   2 g, 100 mL, 200 mL/hr, IV Piggyback, On Call ; Ordering Provider:   VIKI,  CURTIS-MD; Catalog Code:   ceFAZolin  ; Order Dt/Tm:   08/25/2019 17:36:12 EDT          loratadine 10 mg Tab  :   loratadine 10 mg Tab ; Status:   Ordered ; Ordered As Mnemonic:   loratadine ; Simple Display Line:   10 mg, 1 tabs, Oral, Daily, PRN: allergy symptoms ; Ordering Provider:   VIKI,  CURTIS-MD; Catalog Code:   loratadine ; Order Dt/Tm:   08/25/2019 17:36:42 EDT            Home Meds    ibuprofen  :   ibuprofen ; Status:   Documented ; Ordered As Mnemonic:   ibuprofen ; Simple Display Line:   600 mg, Oral, Once, 0 Refill(s) ; Catalog Code:   ibuprofen ; Order Dt/Tm:   08/26/2019 06:56:55 EDT          cyclobenzaprine  :   cyclobenzaprine ; Status:   Documented ; Ordered As Mnemonic:   cyclobenzaprine 10 mg oral tablet ; Simple Display Line:   10 mg, 1 tabs, Oral, TID, PRN, 30 tabs, 0 Refill(s) ; Catalog Code:   cyclobenzaprine ; Order Dt/Tm:   08/24/2019 09:04:10 EDT ; Comment:    THIS MEDICATION IS ASSOCIATED   WITH   AN INCREASED RISK OF FALLS.          acetaminophen -oxyCODONE   :   acetaminophen -oxyCODONE  ; Status:   Documented ; Ordered As Mnemonic:   Percocet  5/325 oral tablet ; Simple Display Line:   1-2 tabs, Oral, q6hr, PRN: moderate pain (4-7), 30 tabs, 0 Refill(s) ; Catalog Code:   acetaminophen -oxyCODONE  ; Order Dt/Tm:   06/28/2019 11:24:53 EST ; Comment:   MAX DAILY DOSE OF ACETAMINOPHEN  = 4000 MG          amitriptyline   :   amitriptyline  ; Status:    Documented ; Ordered As Mnemonic:   amitriptyline  25 mg oral tablet ; Simple Display Line:   mg, tabs, Oral, Once a Day (at bedtime), 0 Refill(s) ; Catalog Code:   amitriptyline  ; Order Dt/Tm:   06/28/2019 11:24:30 EST          cetirizine  :   cetirizine ; Status:   Documented ; Ordered As Mnemonic:   ZyrTEC ; Simple Display Line:   10 mg, Oral, Daily, PRN: allergy symptoms, 0 Refill(s) ; Catalog Code:   cetirizine ; Order Dt/Tm:  06/28/2019 11:24:43 EST            Problem History   (As Of: 08/26/2019 06:59:30 EDT)   Problems(Active)    Acute myelopathy (SNOMED CT  :8784240981 )  Name of Problem:   Acute myelopathy ; Recorder:   LEWEY,  JENNIFER LYNN-FNP; Confirmation:   Confirmed ; Classification:   Medical ; Code:   8784240981 ; Contributor System:   PowerChart ; Last Updated:   08/24/2019 9:04 EDT ; Life Cycle Status:   Active ; Responsible Provider:   RODOLFO NEST LYNN-FNP; Vocabulary:   SNOMED CT        Back pain (SNOMED CT  :747688984 )  Name of Problem:   Back pain ; Recorder:   PYLE, RN, JOYCE; Confirmation:   Confirmed ; Classification:   Patient Stated ; Code:   747688984 ; Contributor System:   PowerChart ; Last Updated:   06/28/2019 11:25 EST ; Life Cycle Date:   06/28/2019 ; Life Cycle Status:   Active ; Vocabulary:   SNOMED CT        OSA (obstructive sleep apnea) (SNOMED CT  :870110984 )  Name of Problem:   OSA (obstructive sleep apnea) ; Recorder:   PYLE, RN, JOYCE; Confirmation:   Confirmed ; Classification:   Patient Stated ; Code:   870110984 ; Contributor System:   PowerChart ; Last Updated:   06/28/2019 11:25 EST ; Life Cycle Date:   06/28/2019 ; Life Cycle Status:   Active ; Vocabulary:   SNOMED CT        Sciatica (SNOMED CT  :61272986 )  Name of Problem:   Sciatica ; Recorder:   PYLE, RN, JOYCE; Confirmation:   Confirmed ; Classification:   Patient Stated ; Code:   [id number] ; Contributor System:   PowerChart ; Last Updated:   06/28/2019 11:25 EST ; Life Cycle Date:   06/28/2019 ; Life Cycle Status:    Active ; Vocabulary:   SNOMED CT          Diagnoses(Active)    Acute myelopathy  Date:   08/24/2019 ; Diagnosis Type:   Discharge ; Confirmation:   Confirmed ; Clinical Dx:   Acute myelopathy ; Classification:   Medical ; Clinical Service:   Non-Specified ; Code:   ICD-10-CM ; Probability:   0 ; Diagnosis Code:   G95.9        Procedure History        -    Procedure History   (As Of: 08/26/2019 06:59:30 EDT)     Anesthesia Minutes:   0 ; Procedure Name:   Hernia ; Procedure Minutes:   0 ; Last Reviewed Dt/Tm:   08/26/2019 06:57:42 EDT            Anesthesia Minutes:   0 ; Procedure Name:   Tonsillectomy ; Procedure Minutes:   0 ; Last Reviewed Dt/Tm:   08/26/2019 06:57:42 EDT            Procedure Dt/Tm:   06/29/2019 11:32:00 EST ; Location:   [facility] ; Provider:   GUERRY RONALEE ORN; Anesthesia Type:   Local ; Anesthesia Minutes:   0 ; Procedure Name:   Transforaminal Epidural Ster Inj ; Procedure Minutes:   2 ; Comments:     06/29/2019 11:34 EST - SHERRILEE, RN, [name] A  auto-populated from documented surgical case ; Clinical Service:   Surgery ; Last Reviewed Dt/Tm:   08/26/2019 06:57:42 EDT  Procedure Dt/Tm:   1995 ; Anesthesia Minutes:   0 ; Procedure Name:   thumb surgery ; Procedure Minutes:   0 ; Last Reviewed Dt/Tm:   08/26/2019 06:57:42 EDT            Procedure Dt/Tm:   04/22/2011 ; Anesthesia Minutes:   0 ; Procedure Name:   Lumbar fusion L4-5 ; Procedure Minutes:   0 ; Last Reviewed Dt/Tm:   08/26/2019 06:57:42 EDT            Procedure Dt/Tm:   8027 ; Anesthesia Minutes:   0 ; Procedure Name:   tonsils and adenoids ; Procedure Minutes:   0 ; Last Reviewed Dt/Tm:   08/26/2019 06:57:42 EDT            Procedure Dt/Tm:   2012 ; Anesthesia Minutes:   0 ; Procedure Name:   redo lumbar fusion L5-S1 ; Procedure Minutes:   0 ; Last Reviewed Dt/Tm:   08/26/2019 06:57:42 EDT            Anesthesia Minutes:   0 ; Procedure Name:   colonoscopy ; Procedure Minutes:   0 ; Last Reviewed Dt/Tm:   08/26/2019  06:57:42 EDT            Procedure Dt/Tm:   8018 ; Anesthesia Minutes:   0 ; Procedure Name:   facial surgery ; Procedure Minutes:   0 ; Last Reviewed Dt/Tm:   08/26/2019 06:57:42 EDT            Procedure Dt/Tm:   2005 ; Anesthesia Minutes:   0 ; Procedure Name:   L5-S1 lumbar fusion ; Procedure Minutes:   0 ; Last Reviewed Dt/Tm:   08/26/2019 06:57:42 EDT            History Confirmation   Problem History Changes PAT :   No   Procedure History Changes PAT :   No   Dahl, RN, [employer] - 08/26/2019 6:55 EDT   Anesthesia/Sedation   Anesthesia History :   Prior general anesthesia   SN - Malignant Hyperthermia :   Denies   Previous Problem with Anesthesia :   None   Moderate Sedation History :   Prior sedation for procedure   Previous Problem With Sedation :   None   Symptoms of Sleep Apnea :   Hypertension, Loud snoring, Male Gender   Symptoms of Sleep Apnea Score :   3    Shortness of Breath Indicator :   No shortness of breath   Pregnancy Status :   N/A   Dahl, RN, Hospital doctor J - 08/26/2019 6:55 EDT   Refer to Anesth Preop Note :   Refer to Anesthesia Preoperative Note- Preanesthesia Evaluation: Preop Clinic   CLAUDENE CHARMAINE BRAVO - 08/25/2019 9:22 EDT   Bloodless Medicine   Is Blood Transfusion Acceptable to Patient :   Yes   CLAUDENE CHARMAINE E - 08/25/2019 9:30 EDT   ID Risk Screen Symptoms   Recent Travel History :   No recent travel   Close Contact with COVID-19 ID :   Preadmission testing patients only   CLAUDENE CHARMAINE E - 08/25/2019 9:30 EDT   Last 14 days COVID-19 ID :   Yes - Not Detected (negative)   Dahl, RN, Hospital doctor J - 08/26/2019 6:55 EDT     TB Symptom Screen :   No symptoms   C. diff Symptom/History ID :   Neither of the above   Mershon,  COURTNEY E - 08/25/2019 9:30 EDT   Immunizations   COVID-19 Vaccine Status :   Not received   CLAUDENE PFEIFFER E - 08/25/2019 9:30 EDT   ID COVID-19 Screen   Fever OR Chills :   No   Headache :   No   New or Worsening Cough :   No   Fatigue :   No   Shortness of Breath ID :   No   Myalgia  (Muscle Pain) :   No   Dyspnea :   No   Diarrhea :   No   Sore Throat :   No   Nausea :   No   Laryngitis :   No   Sudden Loss of Taste or Smell :   No   CLAUDENE PFEIFFER E - 08/25/2019 9:30 EDT   Social History   Social History   (As Of: 08/26/2019 06:59:30 EDT)   Tobacco:        Tobacco use: Never (less than 100 in lifetime).   (Last Updated: 08/25/2019 09:36:30 EDT by RODOLFO NEST LYNN-FNP)          Electronic Cigarette/Vaping:        Never Electronic Cigarette Use.   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

## 2019-08-26 NOTE — Case Communication (Signed)
CM D/C Planning Assessment Ongoing- Text       CM Admission Assessment Entered On:  08/26/2019 15:02 EDT    Performed On:  08/26/2019 15:01 EDT by Arbutus Leas A               CM Admission Assessment   CM PCP Search :   Therisa Doyne P-MD   Discharge Transporation Needs :   No   CM Assessment Discussion With :   Family/Caregiver, Patient   CM Home/Lay Caregiver Name/Relationship :   Matheson Vandehei (wife) [phone] (h) [phone]   CM Living Situation :   Home with no services, Family support   CM Patient Admitted From :   [facility] with supportive wife; no current services   Current Equipment and Treatments :   Cane, CPAP   CM Initial Tentative Discharge Plan :   Home with wife   CM Alternate Discharge Plan :   [facility] or DME if indicated   Anticipated Hosp Related Barriers to DC :   Follow-Up appointments needed   CM Home Barriers :   None   Anticipated Discharge Date :   08/27/2019 EDT   CM Progress Note :   08/26/19 [facility] Reviewed EMR and met with patient and wife in ICU. They live at home and patient states that he is independent and ambulatory at baseline. He has chronic back pain and is on disability due to varying ability to function dependeing on the day, his medication or activity level. He has a cane and CPAP, neither of which he uses. Patient is hopeful to leave the ICU and DC home as soon as possible. No orders or identified needs at this time, but will remain available as needed. M. Anderson      CM Admission Assessment Complete :   Yes   Arbutus Leas A - 08/26/2019 15:02 EDT   CM Reason for Care Management Referral :   Assess support system, Discharge planning assessment   CM Insurance Information :   Medicare, [place]. Name :   [place] Name of Patient Pharmacy :   [facility])   CM Patient has PCP :   Yes   Arbutus Leas A - 08/26/2019 15:01 EDT

## 2019-08-26 NOTE — Assessment & Plan Note (Signed)
PreOp Record - [facility]             PreOp Record - [facility] Summary                                                                     Primary Physician:        Marzella Schlein    Case Number:              [id number]    Finalized Date/Time:      08/26/19 07:02:02    Pt. Name:                 Jesse Savage, Jesse Savage    D.O.B./Sex:               1961-01-24    Male    Med Rec #:                [id number]    Physician:                Marzella Schlein    Financial #:              [id number]    Pt. Type:                 S    Room/Bed:                 /    Admit/Disch:              08/26/19 06:17:00 -    Institution:       [facility] Case Attendance - PreOp                                                                                              Entry 1                         Entry 2                         Entry 3                                          Case Attendee             WORTHINGTON,  CURTIS-MD         Junior,  Sheliah Hatch, RN, Control and instrumentation engineer Primary                 PCT  Preoperative Nurse    Time In     Time Out     Last Modified By:         Horton Chin, RN, Cy Blamer, RN, Cy Blamer, RN, [employer] J                              08/26/19 06:47:35               08/26/19 06:48:00               08/26/19 06:48:00                                Entry 4                                                                                                          Case Attendee             Lelon Mast, RN, Adele Barthel    Role Performed            Preoperative Nurse    Time In     Time Out     Last Modified By:         Horton Chin RN, Amber J                              08/26/19 06:48:00      [facility] Case Attendance - PreOp Audit                                                               08/26/19 06:48:00         Owner: [id number]                              Modifier: [id number]                                                       <+> 2         Case  Attendee        <+> 2         Role Performed        <+> 3         Case Attendee        <+> 3  Role Performed        <+> 4         Case Attendee        <+> 4         Role Performed        [facility] Case Times - PreOp                                                                                                   Entry 1                                                                                                          Patient In Room Time      08/26/19 06:25:00               Nurse In Time                   08/26/19 06:47:00    Nurse Out Time            08/26/19 07:01:00               Patient Ready for               08/26/19 07:01:00                                                              Surgery/Procedure     Last Modified By:         Celedonio Savage, RN, Amber J                              08/26/19 07:01:57      [facility] Case Times - PreOp Audit                                                                    08/26/19 07:01:57         Owner: [id number]                              Modifier: [id number]                                                       <+>  1         Patient Ready for Surgery/Procedure        <+> 1         Nurse Out Time                Finalized By: Celedonio Savage, RN, Amber J      Document Signatures                                                                             Signed By:           Celedonio Savage, RN, Amber J 08/26/19 07:02

## 2019-08-26 NOTE — Anesthesia Pre-Procedure Evaluation (Signed)
 Preanesthesia Evaluation        Patient:   Jesse Savage, Jesse Savage            MRN: [id number]            FIN: [id number]               Age:   59 years     Sex:  Male     DOB:  1960-10-01   Associated Diagnoses:   None   Author:   DEBBY ADE L-MD      Preoperative Information   NPO:  NPO greater than 8 hours.    Anesthesia history     Patient's history: negative.        History of Present Illness   The patient presents for preanesthesia evaluation with 59 yo male scheduled for cervical spine surgery.        Review of Systems   Metabolic Equivalents in Exercise Testing:  > 7.    Respiratory:  Sleep apnea.    Cardiovascular:  Negative.    Neurologic:  Tingling in fingers.       Health Status   Allergies:    Allergic Reactions (Selected)  Unknown  Codeine- Na.  Cymbalta- Na.  Neurontin- Na.   Current medications:    Home Medications (5) Active  amitriptyline  25 mg oral tablet , Oral, Once a Day (at bedtime)  cyclobenzaprine 10 mg oral tablet 10 mg = 1 tabs, PRN, Oral, TID  ibuprofen 600 mg, Oral, Once  Percocet  5/325 oral tablet 1-2 tabs, PRN, Oral, q6hr  ZyrTEC 10 mg, PRN, Oral, Daily  ,    Medications (13) Active  Scheduled: (2)  ceFAZolin  2 g/100 ml NS  2 g 100 mL, IV Piggyback, On Call  sodium chloride  0.9% Inj Soln 10 mL syringe  30 mL, IV Push, q8hr  Continuous: (1)  Lactated Ringers  Injection solution 1,000 mL  1,000 mL, IV, 40 mL/hr  PRN: (10)  A Patient Specific Medication  1 EA, Kit-Combo, q5min  A Patient Specific Refrigerated Medication  1 EA, Kit-Combo, q75min  Delivery and Return Bin Access  1 EA, Kit-Combo, q5min  lidocaine  1% PF Inj Soln 2 mL  0.25 mL, ID, q5min  lidocaine  2% Topical Gel with applicator 10-11 mL  1 app, Topical, q91min  loratadine 10 mg Tab  10 mg 1 tabs, Oral, Daily  Respiratory MDI Treatment  1 EA, Kit-Combo, q5min  sodium chloride  0.9% Inj Soln 10 mL syringe  30 mL, IV Push, q64min  sodium chloride  0.9% Inj Soln 10 mL vial PF  30 mL, IV Push, q69min  sterile water Inj Soln 10 mL  10 mL, N/A,  q12min     Problem list:    Active Problems (4)  Acute myelopathy   Back pain   OSA (obstructive sleep apnea)   Sciatica         Histories   Past Medical History:    No active or resolved past medical history items have been selected or recorded.   Procedure history:    Transforaminal Epidural Ster Inj on 06/29/2019 at 58 Years.  Comments:  06/29/2019 11:34 EST - SHERRILEE, RN, [name] A  auto-populated from documented surgical case  Lumbar fusion L4-5 on 04/22/2011 at 50 Years.  redo lumbar fusion L5-S1 in 2012 at 50 Years.  L5-S1 lumbar fusion in 2005 at 43 Years.  thumb surgery in 1995 at 33 Years.  facial surgery in 1981 at 19 Years.  tonsils and adenoids in  1972 at 39 Years.  Hernia ([id number]).  Tonsillectomy ([id number]).  colonoscopy.   Social History        Social & Psychosocial Habits    Alcohol   08/25/2019  Use: Denies    Substance Use  08/25/2019  Opioid Assessment Opioid Tolerant - > 1 wk    Tobacco  08/25/2019  Use: Never (less than 100 in l    Electronic Cigarette/Vaping  08/25/2019  Electronic Cigarette Use: Never  .     Symptoms of Sleep Apnea Score: PAT Documentation: 3                        08/25/2019 9:22 EDT        Physical Examination   Vital Signs   08/26/2019 6:29 EDT Systolic Blood Pressure 133 mmHg    Diastolic Blood Pressure 93 mmHg  HI    Temperature Oral 36.9 degC    Peripheral Pulse Rate 75 bpm    Respiratory Rate 19 br/min    SpO2 99 %    SBP/DBP Cuff Details Left arm   08/25/2019 9:21 EDT Systolic Blood Pressure 128 mmHg    Diastolic Blood Pressure 82 mmHg    Peripheral Pulse Rate 76 bpm    SpO2 98 %         Vital Signs (last 24 hrs)_____  Last Charted___________  Temp Oral     36.9 degC  (MAR 19 06:29)  Heart Rate Peripheral   75 bpm  (MAR 19 06:29)  Resp Rate         19 br/min  (MAR 19 06:29)  SBP      133 mmHg  (MAR 19 06:29)  DBP      H  (MAR 19 06:29)  SpO2      99 %  (MAR 19 06:29)  Weight      76.4 kg  (MAR 19 06:27)  Height      175.3 cm  (MAR 19 06:27)  BMI      24.86  (MAR 19  06:29)     Measurements from flowsheet : Measurements   08/26/2019 6:29 EDT Body Mass Index est meas 24.86 kg/m2    Body Mass Index Measured 24.86 kg/m2   08/26/2019 6:27 EDT Height/Length Measured 175.3 cm    Weight Measured 76.4 kg    Weight Dosing 76.4 kg    Scale Type Standing    Dosing Weight Difference 0 %   08/25/2019 9:34 EDT Body Mass Index est meas 26.22 kg/m2    Body Mass Index Measured 26.22 kg/m2   08/25/2019 9:21 EDT Height/Length Measured 172.7 cm    Weight Measured 78.2 kg    Weight Dosing 78.2 kg    Ideal Body Weight Calculated 68.382 kg    BSA Measured 1.92 m2    Dosing Weight Difference 0 %      Pain assessment:  Pain Assessment   08/26/2019 6:47 EDT Numeric Rating Pain Scale 4    Numeric Pain Rating Comfort Function Goal 7    Primary Pain Location Back    Pain Location Description Lower    Primary Pain Laterality Bilateral    Primary Pain Quality Dull      .    General:          Stress: No acute distress.         Appearance: Within normal limits.    Airway:          Mallampati classification: II (soft  palate, fauces, uvula visible).         Thyromental Distance: Normal.         Mouth: Dentures ( Upper dentures, temp bridge ).    Head:  Normocephalic.    Neck:  Full range of motion.    Respiratory:  Lungs are clear to auscultation.    Cardiovascular:  Normal rate.    Neurologic:  Alert, Oriented.       Review / Management   Results review:     No qualifying data available, Lab results   08/26/2019 6:29 EDT Estimated Creatinine Clearance 99.47 mL/min   08/25/2019 9:58 EDT WBC 11.0 x10e3/mcL  HI    RBC 4.85 x10e6/mcL    Hgb 14.4 g/dL    Hct 55.7 %    MCV 08.8 fL    MCH 29.7 pg    MCHC 32.6 g/dL    RDW 87.6 %    Platelet 309 x10e3/mcL    MPV 10.2 fL    NRBC Absolute Auto 0.000 x10e3/mcL    NRBC Percent Auto 0.0 %   08/25/2019 9:34 EDT Estimated Creatinine Clearance 96.16 mL/min   .    Documentation reviewed:  Current records.       Assessment and Plan   American Society of Anesthesiologists#(ASA) physical  status classification:  Class II.    Anesthetic Preoperative Plan     Anesthetic technique: General anesthesia.     Maintenance airway: Oral endotracheal tube.     Opioid Assessment: Opioid Nave.     Risks discussed: nausea, vomiting, headache, sore throat, dental injury, hypotension, allergic reaction, serious complications.     Signature Line     Electronically Signed on 08/26/2019 07:32 AM EDT   ________________________________________________   DEBBY ADE L-MD

## 2019-08-26 NOTE — Progress Notes (Signed)
Chaplaincy Note - Text       Chaplaincy Note Entered On:  08/26/2019 16:14 EDT    Performed On:  08/26/2019 16:09 EDT by Elmer Sow R               Chaplaincy Consult   Faith/Denomination :   Other: [employer]aren[employer]   Importance of Faith :   Very important   Worshipping Community :   Information systems manager of God   Religious Spiritual Resources :   Hope, Spiritual well being   Religious Spiritual Practices :   Prayer, Request Bible   Religious Spiritual Concerns :   Anxiety, Fear   Additional Information, Chaplaincy :   Pt talked about experiencing anxiety and fear about his surgery. Pt talked about his church and love for the [place]. Pt stated we needed to have more love one for the other.Iestablished pastoral presence and support, offered prayer, and left contact card.    Elmer Sow R - 08/26/2019 16:09 EDT

## 2019-08-26 NOTE — Anesthesia Post-Procedure Evaluation (Signed)
Postanesthesia Evaluation        Patient:   Jesse Savage, Jesse Savage            MRN: [id number]            FIN: [id number]               Age:   59 years     Sex:  Male     DOB:  [birth year]   Associated Diagnoses:   None   Author:   Huel Cote L-MD      Postoperative Information   Post Operative Info:       Patient location: PACU.       Assessment   Postanesthesia assessment   Vitals: Vital Signs   08/26/2019 6:29 EDT Systolic Blood Pressure 133 mmHg    Diastolic Blood Pressure 93 mmHg  HI    Temperature Oral 36.9 degC    Peripheral Pulse Rate 75 bpm    Respiratory Rate 19 br/min    SpO2 99 %    SBP/DBP Cuff Details Left arm   08/25/2019 9:21 EDT Systolic Blood Pressure 128 mmHg    Diastolic Blood Pressure 82 mmHg    Peripheral Pulse Rate 76 bpm    SpO2 98 %      .     Respiratory function: Respiratory rate, airway, and oxygen saturation are at adequate levels.     Cardiovascular function: Heart Rate stable, Blood Pressure stable, Postoperative hydration status Adequate.     Mental status: appropriate for level of anesthesia.     Temperature: within normal limits.     Pain Control: Adequate.     Nausea/Vomiting: Absent.     Notes: To MRI, for evaluation of left side weakness Moving all 4 extremities well .     Signature Line     Electronically Signed on 08/26/2019 11:44 AM EDT   ________________________________________________   Huel Cote L-MD      Electronically Signed on 08/26/2019 12:33 PM EDT   ________________________________________________   Huel Cote L-MD            Modified by: Huel Cote L-MD on 08/26/2019 12:33 PM EDT

## 2019-08-26 NOTE — Op Note (Signed)
Phase I Record - [facility]             Phase I Record - [facility] Summary                                                                   Primary Physician:        Alcide Goodness,  CURTIS-MD    Case Number:              [id number]    Finalized Date/Time:      08/26/19 13:47:23    Pt. Name:                 Jesse Savage, Jesse Savage    D.O.B./Sex:               1961/02/24    Male    Med Rec #:                [id number]    Physician:                Marzella Schlein    Financial #:              [id number]    Pt. Type:                 R    Room/Bed:                 0227/01    Admit/Disch:              08/26/19 06:17:00 -    Institution:       [facility] Case Attendance - Phase I                                                                                            Entry 1                         Entry 2                                                                          Case Attendee             Alphonsa Gin, RN, RACHEL C    Role Performed            Surgeon Primary                 Post Anesthesia Care  Nurse    Time In                                                   08/26/19 11:39:00    Time Out     Last Modified By:         Graciella Belton RN, Braulio Conte, RN, RACHEL C                              08/26/19 11:30:13               08/26/19 13:17:04      [facility] Case Attendance - Phase I Audit                                                             08/26/19 13:17:04         Owner: Avel Peace                               Modifier: [email or web address]                                                        <+> 2         Case Attendee        <+> 2         Role Performed        <+> 2         Time In        [facility] Case Times - Phase I                                                                                                 Entry 1                                                                                                          Phase I In                 08/26/19 11:39:00  Phase I Out                     08/26/19 13:19:00    Phase I Discharge         08/26/19 13:21:00    Time     Last Modified By:         Nira Conn RN, RACHEL C                              08/26/19 13:47:19    General Comments:            1215 pt to MRI      [facility] Case Times - Phase I Audit                                                                  08/26/19 13:47:19         Owner: Buddy Duty                               Modifier: [email or web address]                                                            1     <*> Phase I Out                            08/26/19 13:17:00            1     <*> Phase I Discharge Time                 08/26/19 13:17:00                Finalized By: Nira Conn RN, RACHEL C      Document Signatures                                                                             Signed By:           Nira Conn RN, RACHEL C 08/26/19 13:47

## 2019-08-26 NOTE — Op Note (Signed)
Operative Report    St. Claudette Head, MD  Service Date: 08/26/2019    INDICATION FOR OPERATION:  Gait disturbance, hand dysfunction, neck  pain.    PREOPERATIVE DIAGNOSIS:  Cervical radiculomyelopathy.    POSTOPERATIVE DIAGNOSIS:  Cervical radiculomyelopathy.    PROCEDURE PERFORMED:  C5-C6 anterior cervical diskectomy with  decompression of spinal cord and nerve roots, including  osteophytectomy, arthrodesis with biomechanical device, autograft,  anterior instrumentation.    SURGEON:  Dr. Alcide Goodness    ASSISTANT:  Reece Leader nurse practitioner.    ANESTHESIA:  General, Thomas.    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    SPECIMEN:  None.    COMPLICATIONS:  Dural rent, repaired.    OPERATIVE NOTE:  After induction of general endotracheal anesthesia,  the patient was positioned supine with the head extended over a roll.   The anterior neck was prepped, infiltrated with local anesthesia and  draped into a sterile field.  Lateral C-arm fluoroscopy was used  through the entire case for localization.  A horizontal skin incision  was made at the C5-C6 level in the right anterior neck just anterior  to the anterior border of the sternomastoid muscle.  This was carried  down through the subcutaneous tissues.  Hemostasis was effected using  electrocautery.  The platysma muscle was divided along its  longitudinal fibers.  The sternomastoid and strap muscle fascias were  sharply and bluntly dissected and the tissue planes followed down to  the anterior vertebral bodies.  The C5-C6 level was identified.  The  longus colli muscles were coagulated and elevated.  Self-retaining  retractors were placed.  The disk space was opened with a #11 blade.   The disk space was curetted out and emptied of its contents.  Anterior  osteophyte was bitten off and the bone saved for use as grafting  material later.   The microscope was brought into the operative field.   Remaining cartilaginous endplate was drilled off as well as the  anterior osteophyte.  This was found to be markedly thick and  projecting into the spinal canal and as dissection proceeded proved to  be quite stuck to the dura.  Using various curettes and Kerrisons  removal was carried out of the anterior osteophyte, thickened  ligament, disk material, etc.  The dura was identified.  A dural  decompression was effected superiorly and inferiorly and out  laterally, which was quite extensive and satisfactory.  A probe was  easily admitted to the neural foramen on each side were decompressed.   Due to the adhesions of the osteophyte to the dura, a small dural rent  was encountered superiorly and then inferiorly where osteophyte was  projecting toward the dura.  This osteophyte was removed and good  decompression obtained.  Duragen and a small amount of dural glue were  left over the dura.  A 6 mm slightly lordotic spacer fit the disk  space well.  The Opal fusion system was used.  After trialing a PEEK  cage with the patient's own bone attached to an anterior plate was  positioned with the interbody being between the 2 vertebral bodies  very satisfactory position.  The plate was secured to the bone with 2  screws directed superiorly and 2 inferiorly, which were placed by  making a hole with an awl and then placing the screw.  A very  satisfactory construct was obtained.  The screws were locked down.   Copious irrigation was carried out using antibiotic solution.  A final  hemostatic check was made.  The platysma muscle was reapproximated  using 2-0 Vicryl.  A subcuticular stitch was placed using a 2-0  Monocryl.  This brought the skin edges well together and Steri-Strips  were placed over it.  Sterile dressing was applied and the patient  went to the recovery room.  It should be noted that the nurse  practitioner Woriax participated in the entirety of the case  from  incision to closure.  Her participation was essential to the success  of the case.  Due to the small dural rents, I asked my colleague, Dr.  Vernie Shanks to come to the operating room and take a look and confirm  that he felt that everything was well decompressed and then we take  appropriate steps to affect all repair.      Evalyn Casco, MD  TR: tn DD: 08/26/2019 11:48 TD: 08/26/2019 12:37 Job#: [id number]  \\[id number]\\DOC#: [id number]  \\[id number]\\  Signature Line    Electronically Signed on 08/26/2019 01:04 PM EDT  ________________________________________________  Alcide Goodness,  Saron Vanorman-MD

## 2019-08-26 NOTE — Nursing Note (Signed)
 Adult Patient History Form-Text       Adult Patient History Entered On:  08/26/2019 17:08 EDT    Performed On:  08/26/2019 16:55 EDT by Sarajane, RN, Shawn C               General Info   Patient Identified :   Identification band, Verbal   Patient Identified :   Jesse Savage   Information Given By :   Self   Preferred Mode of Communication :   Verbal, Written   Accompanied By :   Alfreda   In Charge of News (ICON) Name :   [id number]   Pregnancy Status :   N/A   In Charge of News (ICON) Code :   F9299   Has the patient received chemotherapy or immunotherapy (cytotoxic)  in the last 48-72 hours? :   No   In Clinical Trial With Signed Consent for Related Condition :   No signed consent for clinical trial   Is the patient currently (2-3 days) receiving radiation treatment? :   No   Mayle, RN, Shawn C - 08/26/2019 16:55 EDT   Allergies   (As Of: 08/26/2019 17:08:20 EDT)   Allergies (Active)   codeine  Estimated Onset Date:   Unspecified ; Reactions:   NA ; Created By:   CLARISE LOT D; Reaction Status:   Active ; Category:   Drug ; Substance:   codeine ; Type:   Allergy ; Severity:   Unknown ; Updated By:   CLARISE LOT D; Reviewed Date:   08/26/2019 16:57 EDT      Cymbalta  Estimated Onset Date:   Unspecified ; Reactions:   NA ; Created By:   CLARISE LOT D; Reaction Status:   Active ; Category:   Drug ; Substance:   Cymbalta ; Type:   Allergy ; Severity:   Unknown ; Updated By:   CLARISE LOT D; Reviewed Date:   08/26/2019 16:57 EDT      Neurontin  Estimated Onset Date:   Unspecified ; Reactions:   NA ; Created By:   CLARISE LOT D; Reaction Status:   Active ; Category:   Drug ; Substance:   Neurontin ; Type:   Allergy ; Severity:   Unknown ; Updated By:   CLARISE LOT D; Reviewed Date:   08/26/2019 16:57 EDT        Medication History   Medication List   (As Of: 08/26/2019 17:08:20 EDT)   Normal Order    Sodium Chloride  0.45% intravenous solution 1,000 mL  :   Sodium Chloride  0.45% intravenous  solution 1,000 mL ; Status:   Ordered ; Ordered As Mnemonic:   Sodium Chloride  0.45% 1,000 mL ; Simple Display Line:   100 mL/hr, IV ; Ordering Provider:   WENDELIN ELEANOR HAKE; Catalog Code:   Sodium Chloride  0.45% ; Order Dt/Tm:   08/26/2019 14:37:39 EDT          Lactated Ringers  Injection solution 1,000 mL  :   Lactated Ringers  Injection solution 1,000 mL ; Status:   Ordered ; Ordered As Mnemonic:   Lactated Ringers  Injection 1,000 mL ; Simple Display Line:   40 mL/hr, IV ; Ordering Provider:   VIKI,  CURTIS-MD; Catalog Code:   Lactated Ringers  Injection ; Order Dt/Tm:   08/26/2019 06:23:11 EDT ; Comment:   Perioperative use ONLY  For Non Dialysis Patient          docusate-senna 50 mg-8.6 mg  Tab  :   docusate-senna 50 mg-8.6 mg Tab ; Status:   Ordered ; Ordered As Mnemonic:   docusate-senna 50 mg-8.6 mg oral tablet ; Simple Display Line:   1 tabs, Oral, BID ; Ordering Provider:   WENDELIN ELEANOR HAKE; Catalog Code:   docusate-senna ; Order Dt/Tm:   08/26/2019 14:37:39 EDT ; Comment:   hold for loose stools          famotidine 10 mg/mL IV Soln 2 mL  :   famotidine 10 mg/mL IV Soln 2 mL ; Status:   Ordered ; Ordered As Mnemonic:   Pepcid ; Simple Display Line:   20 mg, 2 mL, IV Push, q12hr ; Ordering Provider:   WENDELIN ELEANOR HAKE; Catalog Code:   famotidine ; Order Dt/Tm:   08/26/2019 12:54:17 EDT          acetaminophen  500 mg Tab  :   acetaminophen  500 mg Tab ; Status:   Ordered ; Ordered As Mnemonic:   acetaminophen  ; Simple Display Line:   500 mg, 1 tabs, Oral, q6hr ; Ordering Provider:   WENDELIN ELEANOR HAKE; Catalog Code:   acetaminophen  ; Order Dt/Tm:   08/26/2019 14:37:39 EDT ; Comment:   Scheduled for multimodal pain control.  Do not exceed 4000mg  APAP in 24 hours.          ceFAZolin  duplex  :   ceFAZolin  duplex ; Status:   Ordered ; Ordered As Mnemonic:   Ancef  ; Simple Display Line:   2 g, 50 mL, 100 mL/hr, IV Piggyback, q8hr-INT ; Ordering Provider:   WENDELIN ELEANOR HAKE; Catalog Code:   ceFAZolin  ; Order Dt/Tm:   08/26/2019 14:37:39 EDT          acetaminophen -oxyCODONE  325 mg-5 mg Tab  :   acetaminophen -oxyCODONE  325 mg-5 mg Tab ; Status:   Ordered ; Ordered As Mnemonic:   oxyCODONE -acetaminophen  5 mg-325 mg oral tablet range dose ; Simple Display Line:   2 tabs, Oral, q6hr, PRN: moderate pain (4-7) ; Ordering Provider:   WENDELIN ELEANOR HAKE; Catalog Code:   acetaminophen -oxyCODONE  ; Order Dt/Tm:   08/26/2019 14:37:39 EDT ; Comment:   MAX DAILY DOSE OF ACETAMINOPHEN  = 4000 MG          aluminum hydroxide/magnesium  hydroxide/simethicone  200 mg-200 mg-20 mg/5 mL  :   aluminum hydroxide/magnesium  hydroxide/simethicone  200 mg-200 mg-20 mg/5 mL ; Status:   Ordered ; Ordered As Mnemonic:   aluminum hydroxide/magnesium  hydroxide/simethicone  200 mg-200 mg-20 mg/5 mL oral suspension ; Simple Display Line:   30 mL, Oral, q4hr, PRN: indigestion ; Ordering Provider:   WENDELIN ELEANOR HAKE; Catalog Code:   Al hydroxide/Mg hydroxide/simethicone  ; Order Dt/Tm:   08/26/2019 14:37:39 EDT          bisacodyl  10 mg Rectal Supp  :   bisacodyl  10 mg Rectal Supp ; Status:   Ordered ; Ordered As Mnemonic:   bisacodyl  ; Simple Display Line:   10 mg, 1 supp, PR, Daily, PRN: constipation ; Ordering Provider:   WENDELIN ELEANOR HAKE; Catalog Code:   bisacodyl  ; Order Dt/Tm:   08/26/2019 14:37:39 EDT ; Comment:   unrelieved by docusate-senna          diphenhydrAMINE  25 mg Cap  :   diphenhydrAMINE  25 mg Cap ; Status:   Ordered ; Ordered As Mnemonic:   diphenhydrAMINE  ; Simple Display Line:   25 mg, 1 caps, Oral, q6hr, PRN: itching/allergic reaction ; Ordering Provider:  WORIAX,  MELISSA LEEANNE-NP; Catalog Code:   diphenhydrAMINE  ; Order Dt/Tm:   08/26/2019 14:37:39 EDT          hydrALAZINE  20 mg/mL Inj Soln 1 mL  :   hydrALAZINE  20 mg/mL Inj Soln 1 mL ; Status:   Ordered ; Ordered As Mnemonic:   hydrALAZINE  ; Simple Display Line:   10 mg, 0.5 mL, IV, q6hr, PRN: hypertension ;  Ordering Provider:   WENDELIN ELEANOR HAKE; Catalog Code:   hydrALAZINE  ; Order Dt/Tm:   08/26/2019 14:37:39 EDT          lidocaine  2% Topical Gel with applicator 10-11 mL  :   lidocaine  2% Topical Gel with applicator 10-11 mL ; Status:   Completed ; Ordered As Mnemonic:   Uro-Jet 2% topical gel with applicator ; Simple Display Line:   1 app, Transurethral, Once ; Ordering Provider:   WENDELIN ELEANOR HAKE; Catalog Code:   lidocaine  topical ; Order Dt/Tm:   08/26/2019 14:37:39 EDT          morphine  2 mg/mL preservative-free Sol  :   morphine  2 mg/mL preservative-free Sol ; Status:   Ordered ; Ordered As Mnemonic:   morphine  range dose ; Simple Display Line:   6 mg, 3 mL, IV Push, q4hr, PRN: severe pain (8-10) ; Ordering Provider:   WENDELIN ELEANOR HAKE; Catalog Code:   morphine  ; Order Dt/Tm:   08/26/2019 14:37:39 EDT ; Comment:   RN may implement IM order if IV acces is lost.          ondansetron  2 mg/mL Inj Soln 2 mL  :   ondansetron  2 mg/mL Inj Soln 2 mL ; Status:   Ordered ; Ordered As Mnemonic:   ondansetron  ; Simple Display Line:   4 mg, 2 mL, IV Push, q6hr, PRN: nausea/vomiting ; Ordering Provider:   WENDELIN ELEANOR HAKE; Catalog Code:   ondansetron  ; Order Dt/Tm:   08/26/2019 14:37:39 EDT ; Comment:   use ondansetron  before promethazine  if both are ordered          ondansetron  4 mg Tab  :   ondansetron  4 mg Tab ; Status:   Ordered ; Ordered As Mnemonic:   ondansetron  ; Simple Display Line:   4 mg, 1 tabs, Oral, q6hr, PRN: nausea/vomiting ; Ordering Provider:   WENDELIN ELEANOR HAKE; Catalog Code:   ondansetron  ; Order Dt/Tm:   08/26/2019 14:37:39 EDT ; Comment:   use ondansetron  before promethazine  if both are ordered          phenol 1.4% Topical Spray 177 mL  :   phenol 1.4% Topical Spray 177 mL ; Status:   Ordered ; Ordered As Mnemonic:   phenol 1.4% topical spray ; Simple Display Line:   5 sprays, Oral, q2hr, PRN: sore throat ; Ordering Provider:   WENDELIN ELEANOR HAKE;  Catalog Code:   phenol topical ; Order Dt/Tm:   08/26/2019 14:37:39 EDT          polyethylene glycol 3350  Oral Powder for Recon  :   polyethylene glycol 3350  Oral Powder for Recon ; Status:   Ordered ; Ordered As Mnemonic:   MiraLax  ; Simple Display Line:   17 g, 1 packets, Oral, Daily, PRN: constipation ; Ordering Provider:   WENDELIN ELEANOR HAKE; Catalog Code:   polyethylene glycol 3350  ; Order Dt/Tm:   08/26/2019 14:37:39 EDT          promethazine  25 mg Tab  :  promethazine  25 mg Tab ; Status:   Ordered ; Ordered As Mnemonic:   promethazine  range dose ; Simple Display Line:   12.5 mg, 0.5 tabs, Oral, q6hr, PRN: nausea/vomiting ; Ordering Provider:   WENDELIN ELEANOR HAKE; Catalog Code:   promethazine  ; Order Dt/Tm:   08/26/2019 14:37:39 EDT ; Comment:   Use ondansetron  before promethazine  if both ordered. Lower doses of 6.25-12.5mg  are recommended for pts > 63 yo.  THIS MEDICATION IS ASSOCIATED   WITH   AN INCREASED RISK OF FALLS.          promethazine  25 mg/mL Inj Soln 1 mL  :   promethazine  25 mg/mL Inj Soln 1 mL ; Status:   Ordered ; Ordered As Mnemonic:   promethazine  range dose ; Simple Display Line:   12.5 mg, 0.5 mL, IM, q6hr, PRN: nausea/vomiting ; Ordering Provider:   WENDELIN ELEANOR HAKE; Catalog Code:   promethazine  ; Order Dt/Tm:   08/26/2019 14:37:39 EDT ; Comment:   Use ondansetron  before promethazine  if both ordered. Lower doses of 6.25-12.5mg  are recommended for pts > 75 yo.  THIS MEDICATION IS ASSOCIATED   WITH   AN INCREASED RISK OF FALLS.          promethazine  25 mg Rectal Supp  :   promethazine  25 mg Rectal Supp ; Status:   Ordered ; Ordered As Mnemonic:   promethazine  range dose ; Simple Display Line:   12.5 mg, 0.5 supp, PR, q6hr, PRN: nausea/vomiting ; Ordering Provider:   WENDELIN ELEANOR HAKE; Catalog Code:   promethazine  ; Order Dt/Tm:   08/26/2019 14:37:39 EDT ; Comment:   Use ondansetron  before promethazine  if both ordered. Lower doses of 6.25-12.5mg  are  recommended for pts > 26 yo.  THIS MEDICATION IS ASSOCIATED   WITH   AN INCREASED RISK OF FALLS.          simethicone  125 mg Chew Tab  :   simethicone  125 mg Chew Tab ; Status:   Ordered ; Ordered As Mnemonic:   simethicone  ; Simple Display Line:   125 mg, 1 tabs, Oral, QID, PRN: gas ; Ordering Provider:   WENDELIN ELEANOR HAKE; Catalog Code:   simethicone  ; Order Dt/Tm:   08/26/2019 14:37:39 EDT ; Comment:   max dose 500 mg in 24 hours          tiZANidine  4 mg Tab  :   tiZANidine  4 mg Tab ; Status:   Ordered ; Ordered As Mnemonic:   tiZANidine  ; Simple Display Line:   4 mg, 1 tabs, Oral, TID, PRN: spasm ; Ordering Provider:   WENDELIN ELEANOR HAKE; Catalog Code:   tiZANidine  ; Order Dt/Tm:   08/26/2019 14:37:39 EDT          sodium chloride  0.9% Inj Soln 10 mL syringe  :   sodium chloride  0.9% Inj Soln 10 mL syringe ; Status:   Ordered ; Ordered As Mnemonic:   sodium chloride  0.9% flush syringe range dose ; Simple Display Line:   30 mL, IV Push, q8hr ; Ordering Provider:   VIKI,  CURTIS-MD; Catalog Code:   sodium chloride  flush ; Order Dt/Tm:   08/26/2019 06:23:36 EDT          HYDROmorphone 2 mg/mL Inj Soln 1 mL  :   HYDROmorphone 2 mg/mL Inj Soln 1 mL ; Status:   Discontinued ; Ordered As Mnemonic:   HYDROmorphone range dose ; Simple Display Line:   0.5 mg, 0.25 mL, IV  Push, q10min, PRN: other (see comment) ; Ordering Provider:   DEBBY ADE L-MD; Catalog Code:   HYDROmorphone ; Order Dt/Tm:   08/26/2019 12:46:52 EDT ; Comment:   For Pain  Max dose = 2mg  Primary choice          ondansetron  2 mg/mL Inj Soln 2 mL  :   ondansetron  2 mg/mL Inj Soln 2 mL ; Status:   Discontinued ; Ordered As Mnemonic:   ondansetron  ; Simple Display Line:   4 mg, 2 mL, IV Push, Once, PRN: nausea/vomiting ; Ordering Provider:   DEBBY ADE L-MD; Catalog Code:   ondansetron  ; Order Dt/Tm:   08/26/2019 12:46:52 EDT ; Comment:   Primary choice          dexAMETHasone  4 mg/mL Inj Soln 1 mL  :   dexAMETHasone  4 mg/mL  Inj Soln 1 mL ; Status:   Ordered ; Ordered As Mnemonic:   Decadron  ; Simple Display Line:   4 mg, 1 mL, IV Push, q6hr ; Ordering Provider:   WENDELIN ELEANOR HAKE; Catalog Code:   dexAMETHasone  ; Order Dt/Tm:   08/26/2019 11:55:06 EDT          dexAMETHasone  4 mg/mL Inj Soln 1 mL  :   dexAMETHasone  4 mg/mL Inj Soln 1 mL ; Status:   Completed ; Ordered As Mnemonic:   Decadron  ; Simple Display Line:   8 mg, 2 mL, IV Push, Once ; Ordering Provider:   VIKI,  CURTIS-MD; Catalog Code:   dexAMETHasone  ; Order Dt/Tm:   08/26/2019 12:01:27 EDT          A Patient Specific Medication  :   A Patient Specific Medication ; Status:   Ordered ; Ordered As Mnemonic:   A Patient Specific Medication ; Simple Display Line:   1 EA, Kit-Combo, q74min, PRN: other (see comment) ; Ordering Provider:   VIKI,  CURTIS-MD; Catalog Code:   A Patient Specific Medication ; Order Dt/Tm:   08/26/2019 06:23:36 EDT          A Patient Specific Refrigerated Medication  :   A Patient Specific Refrigerated Medication ; Status:   Ordered ; Ordered As Mnemonic:   A Patient Specific Refrigerated Medication ; Simple Display Line:   1 EA, Kit-Combo, q77min, PRN: other (see comment) ; Ordering Provider:   VIKI,  CURTIS-MD; Catalog Code:   A Patient Specific Refrigerated Medicati ; Order Dt/Tm:   08/26/2019 06:23:36 EDT ; Comment:   to access the patient specific Refrigerated medications          Delivery and Return [place] Access  :   Delivery and Return [place] Access ; Status:   Ordered ; Ordered As Mnemonic:   Delivery and Return Bin Access ; Simple Display Line:   1 EA, Kit-Combo, q38min, PRN: other (see comment) ; Ordering Provider:   VIKI FRIEDMAN; Catalog Code:   Delivery and Return Bin Access ; Order Dt/Tm:   08/26/2019 06:23:36 EDT ; Comment:   This code grants access to the [employer] for the Delivery and Return Bin Access          lidocaine  1% PF Inj Soln 2 mL  :   lidocaine  1% PF Inj Soln 2 mL ; Status:    Ordered ; Ordered As Mnemonic:   lidocaine  1% preservative-free injectable solution ; Simple Display Line:   0.25 mL, ID, q5min, PRN: other (see comment) ; Ordering Provider:   VIKI,  CURTIS-MD; Catalog Code:  lidocaine  ; Order Dt/Tm:   08/26/2019 06:23:36 EDT ; Comment:   to access lidocaine  1%  2 mL vial for IV start and [facility] access          lidocaine  2% Topical Gel with applicator 10-11 mL  :   lidocaine  2% Topical Gel with applicator 10-11 mL ; Status:   Ordered ; Ordered As Mnemonic:   Uro-Jet 2% topical gel with applicator ; Simple Display Line:   1 app, Topical, q5min, PRN: other (see comment) ; Ordering Provider:   VIKI,  CURTIS-MD; Catalog Code:   lidocaine  topical ; Order Dt/Tm:   08/26/2019 06:23:36 EDT          Respiratory MDI Treatment  :   Respiratory MDI Treatment ; Status:   Ordered ; Ordered As Mnemonic:   Respiratory MDI Treatment ; Simple Display Line:   1 EA, Kit-Combo, q5min, PRN: other (see comment) ; Ordering Provider:   VIKI,  CURTIS-MD; Catalog Code:   Respiratory MDI Treatment ; Order Dt/Tm:   08/26/2019 06:23:36 EDT          sodium chloride  0.9% Inj Soln 10 mL syringe  :   sodium chloride  0.9% Inj Soln 10 mL syringe ; Status:   Ordered ; Ordered As Mnemonic:   sodium chloride  0.9% flush syringe range dose ; Simple Display Line:   30 mL, IV Push, q5min, PRN: other (see comment) ; Ordering Provider:   VIKI,  CURTIS-MD; Catalog Code:   sodium chloride  flush ; Order Dt/Tm:   08/26/2019 06:23:36 EDT          sodium chloride  0.9% Inj Soln 10 mL vial PF  :   sodium chloride  0.9% Inj Soln 10 mL vial PF ; Status:   Ordered ; Ordered As Mnemonic:   sodium chloride  0.9% vial for reconstitution range dose ; Simple Display Line:   30 mL, IV Push, q5min, PRN: other (see comment) ; Ordering Provider:   VIKI,  CURTIS-MD; Catalog Code:   sodium chloride  flush ; Order Dt/Tm:   08/26/2019 06:23:36 EDT ; Comment:   for access to sodium chloride  0.9% vial when needed as a  diluent  for reconstitutable medications          sterile water Inj Soln 10 mL  :   sterile water Inj Soln 10 mL ; Status:   Ordered ; Ordered As Mnemonic:   sterile water for reconstitution ; Simple Display Line:   10 mL, N/A, q5min, PRN: other (see comment) ; Ordering Provider:   VIKI,  CURTIS-MD; Catalog Code:   sterile water for reconstitution ; Order Dt/Tm:   08/26/2019 06:23:36 EDT ; Comment:   Access sterile water when needed as a diluent  for reconstitutable medications. Not for IV use.          ceFAZolin  2 g/100 ml NS  :   ceFAZolin  2 g/100 ml NS ; Status:   Completed ; Ordered As Mnemonic:   Ancef  IVPB ; Simple Display Line:   2 g, 100 mL, 200 mL/hr, IV Piggyback, On Call ; Ordering Provider:   VIKI,  CURTIS-MD; Catalog Code:   ceFAZolin  ; Order Dt/Tm:   08/25/2019 17:36:12 EDT          loratadine 10 mg Tab  :   loratadine 10 mg Tab ; Status:   Ordered ; Ordered As Mnemonic:   loratadine ; Simple Display Line:   10 mg, 1 tabs, Oral, Daily, PRN: allergy symptoms ; Ordering Provider:   VIKI,  CURTIS-MD; Catalog  Code:   loratadine ; Order Dt/Tm:   08/25/2019 17:36:42 EDT            Home Meds    ibuprofen  :   ibuprofen ; Status:   Documented ; Ordered As Mnemonic:   ibuprofen ; Simple Display Line:   600 mg, Oral, Once, 0 Refill(s) ; Catalog Code:   ibuprofen ; Order Dt/Tm:   08/26/2019 06:56:55 EDT          cyclobenzaprine  :   cyclobenzaprine ; Status:   Documented ; Ordered As Mnemonic:   cyclobenzaprine 10 mg oral tablet ; Simple Display Line:   10 mg, 1 tabs, Oral, TID, PRN, 30 tabs, 0 Refill(s) ; Catalog Code:   cyclobenzaprine ; Order Dt/Tm:   08/24/2019 09:04:10 EDT ; Comment:    THIS MEDICATION IS ASSOCIATED   WITH   AN INCREASED RISK OF FALLS.          acetaminophen -oxyCODONE   :   acetaminophen -oxyCODONE  ; Status:   Documented ; Ordered As Mnemonic:   Percocet  5/325 oral tablet ; Simple Display Line:   1-2 tabs, Oral, q6hr, PRN: moderate pain (4-7), 30 tabs, 0 Refill(s) ; Catalog  Code:   acetaminophen -oxyCODONE  ; Order Dt/Tm:   06/28/2019 11:24:53 EST ; Comment:   MAX DAILY DOSE OF ACETAMINOPHEN  = 4000 MG          amitriptyline   :   amitriptyline  ; Status:   Documented ; Ordered As Mnemonic:   amitriptyline  25 mg oral tablet ; Simple Display Line:   mg, tabs, Oral, Once a Day (at bedtime), 0 Refill(s) ; Catalog Code:   amitriptyline  ; Order Dt/Tm:   06/28/2019 11:24:30 EST          cetirizine  :   cetirizine ; Status:   Documented ; Ordered As Mnemonic:   ZyrTEC ; Simple Display Line:   10 mg, Oral, Daily, PRN: allergy symptoms, 0 Refill(s) ; Catalog Code:   cetirizine ; Order Dt/Tm:   06/28/2019 11:24:43 EST            Problem History   (As Of: 08/26/2019 17:08:20 EDT)   Problems(Active)    Acute myelopathy (SNOMED CT  :8784240981 )  Name of Problem:   Acute myelopathy ; Recorder:   LEWEY,  JENNIFER LYNN-FNP; Confirmation:   Confirmed ; Classification:   Medical ; Code:   8784240981 ; Contributor System:   PowerChart ; Last Updated:   08/24/2019 9:04 EDT ; Life Cycle Status:   Active ; Responsible Provider:   RODOLFO NEST LYNN-FNP; Vocabulary:   SNOMED CT        Back pain (SNOMED CT  :747688984 )  Name of Problem:   Back pain ; Recorder:   PYLE, RN, JOYCE; Confirmation:   Confirmed ; Classification:   Patient Stated ; Code:   747688984 ; Contributor System:   PowerChart ; Last Updated:   06/28/2019 11:25 EST ; Life Cycle Date:   06/28/2019 ; Life Cycle Status:   Active ; Vocabulary:   SNOMED CT        OSA (obstructive sleep apnea) (SNOMED CT  :870110984 )  Name of Problem:   OSA (obstructive sleep apnea) ; Recorder:   PYLE, RN, JOYCE; Confirmation:   Confirmed ; Classification:   Patient Stated ; Code:   [id number] ; Contributor System:   PowerChart ; Last Updated:   06/28/2019 11:25 EST ; Life Cycle Date:   06/28/2019 ; Life Cycle Status:   Active ; Vocabulary:  SNOMED CT        Sciatica (SNOMED CT  :61272986 )  Name of Problem:   Sciatica ; Recorder:   PYLE, RN, JOYCE; Confirmation:    Confirmed ; Classification:   Patient Stated ; Code:   [id number] ; Contributor System:   PowerChart ; Last Updated:   06/28/2019 11:25 EST ; Life Cycle Date:   06/28/2019 ; Life Cycle Status:   Active ; Vocabulary:   SNOMED CT          Diagnoses(Active)    Acute myelopathy  Date:   08/24/2019 ; Diagnosis Type:   Discharge ; Confirmation:   Confirmed ; Clinical Dx:   Acute myelopathy ; Classification:   Medical ; Clinical Service:   Non-Specified ; Code:   ICD-10-CM ; Probability:   0 ; Diagnosis Code:   G95.9        Procedure History        -    Procedure History   (As Of: 08/26/2019 17:08:20 EDT)     Anesthesia Minutes:   0 ; Procedure Name:   Hernia ; Procedure Minutes:   0            Anesthesia Minutes:   0 ; Procedure Name:   Tonsillectomy ; Procedure Minutes:   0            Procedure Dt/Tm:   06/29/2019 11:32:00 EST ; Location:   [facility] ; Provider:   GUERRY RONALEE ORN; Anesthesia Type:   Local ; Anesthesia Minutes:   0 ; Procedure Name:   Transforaminal Epidural Ster Inj ; Procedure Minutes:   2 ; Comments:     06/29/2019 11:34 EST - SHERRILEE, RN, [name] A  auto-populated from documented surgical case ; Clinical Service:   Surgery            Procedure Dt/Tm:   1995 ; Anesthesia Minutes:   0 ; Procedure Name:   thumb surgery ; Procedure Minutes:   0            Procedure Dt/Tm:   04/22/2011 ; Anesthesia Minutes:   0 ; Procedure Name:   Lumbar fusion L4-5 ; Procedure Minutes:   0            Procedure Dt/Tm:   8027 ; Anesthesia Minutes:   0 ; Procedure Name:   tonsils and adenoids ; Procedure Minutes:   0            Procedure Dt/Tm:   2012 ; Anesthesia Minutes:   0 ; Procedure Name:   redo lumbar fusion L5-S1 ; Procedure Minutes:   0            Anesthesia Minutes:   0 ; Procedure Name:   colonoscopy ; Procedure Minutes:   0            Procedure Dt/Tm:   8018 ; Anesthesia Minutes:   0 ; Procedure Name:   facial surgery ; Procedure Minutes:   0            Procedure Dt/Tm:   2005 ; Anesthesia Minutes:   0 ;  Procedure Name:   L5-S1 lumbar fusion ; Procedure Minutes:   0            Procedure Dt/Tm:   08/26/2019 08:40:00 EDT ; Location:   [facility] ; Provider:   VIKI,  CURTIS-MD; Anesthesia Type:   General ; :   DEBBY ADE L-MD; Anesthesia Minutes:   0 ;  Procedure Name:   Anterior Cervical Discectomy with Fusion and/or Stabilization SCIP ; Procedure Minutes:   166 ; Comments:     08/26/2019 11:38 EDT - [place], RN, Sharlet PARAS  auto-populated from documented surgical case ; Clinical Service:   Surgery            Immunizations   Influenza Vaccine Status :   Patient refused   Influenza Vaccine Status :   Greater than 5 years   COVID-19 Vaccine Status :   Not received   Mayle, RN, Shawn C - 08/26/2019 16:55 EDT   ID Risk Screen Symptoms   MRSA/VRE Screening :   Admitted to an Critical Care or BMT   CONNELLY, RN, ALISA BRAVO - 08/26/2019 18:38 EDT   Recent Travel History :   No recent travel   Close Contact with COVID-19 ID :   Preadmission testing patients only   Last 14 days COVID-19 ID :   Yes - Not Detected (negative)   TB Symptom Screen :   No symptoms   C. diff Symptom/History ID :   Neither of the above   CRE Screening :   No   Mayle, RN, Shawn C - 08/26/2019 16:55 EDT   ID COVID-19 Screen   Fever OR Chills :   No   Headache :   No   New or Worsening Cough :   No   Fatigue :   No   Shortness of Breath ID :   No   Myalgia (Muscle Pain) :   No   Dyspnea :   No   Diarrhea :   No   Sore Throat :   No   Nausea :   No   Laryngitis :   No   Sudden Loss of Taste or Smell :   No   Mayle, RN, Shawn C - 08/26/2019 16:55 EDT   Bloodless Medicine   Is Blood Transfusion Acceptable to Patient :   Yes   Mayle, RN, Shawn C - 08/26/2019 16:55 EDT   Nutrition   MST Have You Recently Lost Weight Without Trying? :   Yes - see next question   MST If Yes, How Much Weight Have You Lost? :   2-13 lb   MST Weight Loss Score :   1    MST Have You Been Eating Poorly? :   No   MST Appetite Score :   0    MST Score :   1    MST Interpretation :   Not at  risk   Mayle, RN, Shawn C - 08/26/2019 16:55 EDT   Functional   ADLs Prior to Admission :   Independent   Mayle, RN, Elouise BROCKS - 08/26/2019 16:55 EDT   Social History   Social History   (As Of: 08/26/2019 17:08:20 EDT)   Tobacco:        Tobacco use: Never (less than 100 in lifetime).   (Last Updated: 08/25/2019 09:36:30 EDT by RODOLFO NEST LYNN-FNP)          Electronic Cigarette/Vaping:        Never Electronic Cigarette Use.   (Last Updated: 08/25/2019 09:36:30 EDT by RODOLFO NEST LYNN-FNP)          Alcohol :        Denies   (Last Updated: 08/25/2019 09:36:30 EDT by RODOLFO NEST LYNN-FNP)          Substance Use:  Opioid Tolerant - taking opioids greater than 1 week   (Last Updated: 08/25/2019 09:36:30 EDT by RODOLFO NEST LYNN-FNP)            Spiritual   Faith/Denomination :   Other: Nazarene   Do you have a concern that you would like to address with a Chaplain? :   No   Do you have any religious/spiritual/cultural beliefs that could impact the way your care is provided? :   No   Mayle, RN, Elouise C - 08/26/2019 16:55 EDT   Harm Screen   Injuries/Abuse/Neglect in Household :   Denies   Feels Unsafe at Home :   No   Last 3 mo, thoughts killing self/others :   Patient denies   Mayle, RN, Elouise BROCKS - 08/26/2019 16:55 EDT   Advance Directive   Location of Advance Directive :   Family to bring in copy from home   Advance Directive :   Yes   Type of Advance Directive :   Living will, Medical durable power of attorney   Sarajane, RN, Elouise BROCKS - 08/26/2019 16:55 EDT   Education   Written Language :   Isadora   Primary Language :   Isadora Sarajane, RN, Shawn C - 08/26/2019 16:55 EDT   Caregiver/Advocate Language   Patient :   None   (Comment: TO DO: PARTICIPATE Cyrilla, RN, Shawn C - 08/26/2019 16:55 EDT] )   Family :   None   Mayle, RN, Elouise BROCKS - 08/26/2019 16:55 EDT   Barriers to Learning :   None evident   Teaching Method :   Teach-back   Responsible Learner Present for Session :   Chaney Sarajane, RN, Shawn C - 08/26/2019 16:55  EDT   Preventative Measures Information   Unit/Room Orientation :   Verbalizes understanding   Environmental Safety :   Verbalizes understanding   Hand Washing :   Verbalizes understanding   Infection Prevention :   Verbalizes understanding   DVT Prophylaxis :   Verbalizes understanding   Mayle, RN, Shawn C - 08/26/2019 16:55 EDT   DC Needs   CM Living Situation :   Home with no services, Family support   Current Equipment and Treatments :   Cane, CPAP   Mayle, RN, Elouise BROCKS - 08/26/2019 16:55 EDT   Valuables and Belongings   Valuables and Belongings   At Bedside :   Clothes, Glasses   Mayle, RN, Shawn C - 08/26/2019 16:55 EDT   Admission Complete   Admission Complete :   Yes   Mayle, RN, Shawn C - 08/26/2019 16:55 EDT

## 2019-08-27 LAB — CBC
Hematocrit: 40 % (ref 38.0–52.0)
Hemoglobin: 12.9 g/dL — ABNORMAL LOW (ref 13.0–17.3)
MCH: 29.8 pg (ref 27.0–34.5)
MCHC: 32.3 g/dL (ref 32.0–36.0)
MCV: 92.4 fL (ref 84.0–100.0)
MPV: 9.9 fL (ref 7.2–13.2)
NRBC Absolute: 0 10*3/uL (ref 0.000–0.012)
NRBC Automated: 0 % (ref 0.0–0.2)
Platelets: 266 10*3/uL (ref 140–440)
RBC: 4.33 x10e6/mcL (ref 4.00–5.60)
RDW: 11.9 % (ref 11.0–16.0)
WBC: 9.3 10*3/uL (ref 3.8–10.6)

## 2019-08-27 LAB — BASIC METABOLIC PANEL
Anion Gap: 12 mmol/L (ref 2–17)
BUN: 16 mg/dL (ref 6–20)
CO2: 24 mmol/L (ref 22–29)
Calcium: 8.9 mg/dL (ref 8.6–10.0)
Chloride: 100 mmol/L (ref 98–107)
Creatinine: 0.8 mg/dL (ref 0.7–1.3)
GFR African American: 113 mL/min/{1.73_m2} (ref >=90)
GFR Non-African American: 98 mL/min/{1.73_m2} (ref >=90)
Glucose: 148 mg/dL — ABNORMAL HIGH (ref 70–99)
Osmolaliy Calculated: 276 mosm/kg (ref 270–287)
Potassium: 3.7 mmol/L (ref 3.5–5.3)
Sodium: 136 mmol/L (ref 135–145)

## 2019-08-27 NOTE — Case Communication (Signed)
CM Discharge Planning Assessment - Text       CM Progress Note Entered On:  08/27/2019 13:52 EDT    Performed On:  08/27/2019 13:47 EDT by Arbutus Leas A               CM Progress Note   CM Home/Lay Caregiver Name/Relationship :   Merric Yost (wife) [phone] (h) [phone]   CM Initial Tentative Discharge Plan :   Home with wife   CM Alternate Discharge Plan :   [facility] or DME if indicated   Arbutus Leas A - 08/27/2019 13:47 EDT   CM Progress Note :   08/26/19 ROC Reviewed EMR and met with patient and wife in ICU. They live at home and patient states that he is independent and ambulatory at baseline. He has chronic back pain and is on disability due to varying ability to function dependeing on the day, his medication or activity level. He has a cane and CPAP, neither of which he uses. Patient is hopeful to leave the ICU and DC home as soon as possible. No orders or identified needs at this time, but will remain available as needed. M. Ouida Sills     08/27/19 Patient was changed to IPC. Updated wife Baker Janus and reviewed IM. She acknowledged underanding of DC and appeal rights. 08/27/19 1348hrs. Reviewed therapy notes and patient is recommended for AIRF. Emailed AIRF list to wife for review and discussed options. She will plan to discuss with patient today when visiting and follow up with CM for referrals. M. Cherlyn Roberts,  MELISSA A - 08/27/2019 13:58 EDT

## 2019-08-27 NOTE — Progress Notes (Signed)
 Inpatient PT Examination - Text       Inpatient PT Examination Entered On:  08/27/2019 12:46 EDT    Performed On:  08/27/2019 12:39 EDT by SHONA, PT, LAURA A               Reason for Treatment   Subjective Statement :   Pt agreeable to OOB attempts I want to get up to the chair     *Reason for Referral :   pt s/p C5-6 ACDF with post op L side weakness    fall risk, cervical precautions; PMH: previous spinal surgeries     HALL, PT, LAURA A - 08/27/2019 12:39 EDT   General Info   Physical Therapy Orders :   PT Evaluation and Treatment Acute - 08/26/19 14:37:00 EDT, Stop date 08/26/19 14:37:00 EDT, A consult cannot be completed if there is a bedrest activity order; check for bedrest order.     Precautions RTF :    Notify Provider, 08/26/19 14:37:00 EDT, Any Change In Patient's Neurological Status, 08/26/19 14:37:00 EDT, 08/26/19 14:37:00 EDT, Ordered   Notify Provider, 08/26/19 14:37:00 EDT, if patient unable to void, bladder scan residual volume is greater than , and indwelling foley placed, 08/26/19 14:37:00 EDT, 08/26/19 14:37:00 EDT, Ordered   Notify Provider, 08/26/19 14:37:00 EDT, If the patient has symptoms that are unrelieved by in and out catheterization or has bleeding at any time, notify the physician for additional orders., 08/26/19 14:37:00 EDT, 08/26/19 14:37:00 EDT, Ordered   Notify Provider, 08/26/19 14:37:00 EDT, If indwelling catheter is placed due to inability to void and bladder scan volume = after 1st in and out catheterization, 08/26/19 14:37:00 EDT, 08/26/19 14:37:00 EDT, Ordered   Notify Provider Vital Signs, 08/26/19 14:37:00 EDT, Notify Provider Vital Signs for Temp greater than 101.5 degree F, 38.6 degree C, 08/26/19 14:37:00 EDT, Ordered   Notify Provider Vital Signs, 08/26/19 14:37:00 EDT, HR greater than 120, HR less than 60, 08/26/19 14:37:00 EDT, Ordered   Notify Provider Vital Signs, 08/26/19 14:37:00 EDT, O2 sat less than 92, 08/26/19 14:37:00 EDT, Ordered   Notify Provider  Vital Signs, 08/26/19 14:37:00 EDT, RR greater than 24, RR less than 10, 08/26/19 14:37:00 EDT, Ordered   Notify Provider Vital Signs, 08/26/19 14:37:00 EDT, SBP greater than 180, SBP less than 90, 08/26/19 14:37:00 EDT, Ordered   Notify Rapid Response Team, 08/26/19 14:37:00 EDT, For concerns regarding patient condition & notify MD, 08/26/19 14:37:00 EDT, 08/26/19 14:37:00 EDT, Ordered   COVID-19 Status, 08/26/19 13:33:00 EDT, NOT VALID FOR pharmacy, laboratory, radiology., 08/26/19 13:33:00 EDT, COVID-19 Not Detected, Ordered     Orientation Assessment :   Oriented x 4   Affect/Behavior :   Appropriate   Basic Command Following :   Intact   Safety/Judgment :   Intact   Pain Present :   Yes actual or suspected pain   HALL, PT, LAURA A - 08/27/2019 12:39 EDT   Problem List   (As Of: 08/27/2019 12:46:40 EDT)   Problems(Active)    Acute myelopathy (SNOMED CT  :8784240981 )  Name of Problem:   Acute myelopathy ; Recorder:   LEWEY,  JENNIFER LYNN-FNP; Confirmation:   Confirmed ; Classification:   Medical ; Code:   8784240981 ; Contributor System:   PowerChart ; Last Updated:   08/24/2019 9:04 EDT ; Life Cycle Status:   Active ; Responsible Provider:   RODOLFO NEST LYNN-FNP; Vocabulary:   SNOMED CT        Back pain (SNOMED CT  :  747688984 )  Name of Problem:   Back pain ; Recorder:   PYLE, RN, JOYCE; Confirmation:   Confirmed ; Classification:   Patient Stated ; Code:   747688984 ; Contributor System:   PowerChart ; Last Updated:   06/28/2019 11:25 EST ; Life Cycle Date:   06/28/2019 ; Life Cycle Status:   Active ; Vocabulary:   SNOMED CT        OSA (obstructive sleep apnea) (SNOMED CT  :870110984 )  Name of Problem:   OSA (obstructive sleep apnea) ; Recorder:   PYLE, RN, JOYCE; Confirmation:   Confirmed ; Classification:   Patient Stated ; Code:   870110984 ; Contributor System:   PowerChart ; Last Updated:   06/28/2019 11:25 EST ; Life Cycle Date:   06/28/2019 ; Life Cycle Status:   Active ; Vocabulary:   SNOMED CT         Sciatica (SNOMED CT  :61272986 )  Name of Problem:   Sciatica ; Recorder:   PYLE, RN, JOYCE; Confirmation:   Confirmed ; Classification:   Patient Stated ; Code:   61272986 ; Contributor System:   PowerChart ; Last Updated:   06/28/2019 11:25 EST ; Life Cycle Date:   06/28/2019 ; Life Cycle Status:   Active ; Vocabulary:   SNOMED CT          Diagnoses(Active)    Acute myelopathy  Date:   08/24/2019 ; Diagnosis Type:   Discharge ; Confirmation:   Confirmed ; Clinical Dx:   Acute myelopathy ; Classification:   Medical ; Clinical Service:   Non-Specified ; Code:   ICD-10-CM ; Probability:   0 ; Diagnosis Code:   G95.9      Unsteadiness on feet  Date:   08/27/2019 ; Diagnosis Type:   Other ; Confirmation:   Differential ; Clinical Dx:   Unsteadiness on feet ; Classification:   Interdisciplinary ; Clinical Service:   Non-Specified ; Code:   ICD-10-CM ; Probability:   0 ; Diagnosis Code:   R26.81        Pain Assessment   Pain Present :   Yes actual or suspected pain   Pain Present :   Hand   Laterality :   Left   HALL, PT, LAURA A - 08/27/2019 12:39 EDT   Home Environment   Living Environment :   Home Environment    No qualifying data available     Living Situation :   Home independently   Lives With :   Alone   Lives In :   Single level home   [place], PT, LAURA A - 08/27/2019 12:39 EDT   Home Environment II   Living Environment :   Home Environment    No qualifying data available     [place], PT, LAURA A - 08/27/2019 12:39 EDT   Prior Functional Status   ADL :   Independent   Mobility :   Independent   HALL, PT, LAURA A - 08/27/2019 12:39 EDT   Additional Information :   pt has DME but does not use it normally, its from previous surgeries   HALL, PT, LAURA A - 08/27/2019 12:39 EDT   LE Range/Strength   LE Overall Range of Motion Grid   Left Lower Extremity Passive Range :   Within functional limits   Left Lower Extremity Active Range :   Within functional limits   Right Lower Extremity Passive Range :   Within functional limits  Right Lower Extremity Active Range :   Within functional limits   HALL, PT, LAURA A - 08/27/2019 12:39 EDT   Left Lower Extremity Strength Grid   Hip Flexion :   4   Knee Extension :   5   Ankle Dorsiflexion :   5   HALL, PT, LAURA A - 08/27/2019 12:39 EDT   Rt Lower Extremity Strength :   Within functional limits   HALL, PT, LAURA A - 08/27/2019 12:39 EDT   Sensation   Left Lower Extremity Sensation   Light Touch :   Impaired   HALL, PT, LAURA A - 08/27/2019 12:39 EDT   Right Lower Extremity Sensation   Light Touch :   Intact   HALL, PT, LAURA A - 08/27/2019 12:39 EDT   Mobility   Mobility Grid   Supine to Sit :   1, Rehab Minimal assistance   Scooting :   1, Rehab Minimal assistance   Edge of Bed Assist :   1, Rehab Supervision, Rehab Contact guard assistance   Transfer Sit to Stand :   1, Rehab Minimal assistance, 2   Transfer Stand to Sit :   1, 2, Rehab Minimal assistance   HALL, PT, LAURA A - 08/27/2019 12:39 EDT   Ambulation Level Acute :   Moderate assistance   Ambulation Quality :   min-mod A x2 to amb to chair, required A with management of RW, sig R lateral lean, good use of RW   Ambulation Distance :   4 ft   Device :   Rolling walker   [place], PT, LAURA A - 08/27/2019 12:39 EDT   Balance   Balance Tests Performed :   Other: good sitting and fair standing balance   HALL, PT, LAURA A - 08/27/2019 12:39 EDT   AM-PAC Basic Mobility   AM-PAC Basic Mobility   Turning from your back on your side while in a flat bed without using bedrails? :   A lot = 2 points   Moving from lying on your back to sitting on the side of a flat bed without using bedrails? :   A lot = 2 points   Moving to and from a bed to a chair (including a wheelchair)? :   A little = 3 points   Standing up from a chair using your arms (e.g. wheelchair or bedside chair)? :   A little = 3 points   To walk in hospital room? :   A little = 3 points   Climbing 3-5 steps with a railing?* :   A little = 3 points   HALL, PT, LAURA A - 08/27/2019 12:39 EDT    AM-PAC Mobility Raw Score :   16    HALL, PT, LAURA A - 08/27/2019 12:39 EDT   Assessment   PT Impairments or Limitations :   Ambulation deficits, Balance deficits, Bed mobility deficits, Pain limiting function, Strength deficits, Transfer deficits   Discharge Recommendations :   eval: acute rehab     D/CTransportation Recommendations :   Stretcher/Requires skilled assist for transfer   D/C Transportation Recommendations Reviewed :   Yes   PT Treatment Recommendations :   Pt has impairments with functional mobility and ambulation. Pt would benefit from acute skilled Pt services for bed mobility, transfer and gait training. Rec d/c to acute rehab     [place], [place] A - 08/27/2019 12:39 EDT   Education   Physical Therapy Education Grid  Physical Therapy Plan of Care :   Verbalizes understanding   [place], PT, LAURA A - 08/27/2019 12:39 EDT   Long Term Goals   PT LT Goals Reviewed :   Chaney HURST, PT, LAURA A - 08/27/2019 12:39 EDT   Outpatient PT Long Term Goals Rehab     Long Term Goal 1  Long Term Goal 2  Long Term Goal 3      Goal :    ind with supine to sit   ind with sit to stand   ind with amb 348ft with LRAD        Comments :    8 days from 08/27/19                HALL, PT, LAURA A - 08/27/2019 12:39 EDT  HALL, PT, LAURA A - 08/27/2019 12:39 EDT  HALL, PT, LAURA A - 08/27/2019 12:39 EDT       Short Term Goals   PT ST Goals Reviewed :   Chaney HURST, PT, LAURA A - 08/27/2019 12:39 EDT   Other PT Goals Grid     Goal #1  Goal #2  Goal #3      Goal :    CGA supine to sit   CGA sit to stand   CGA for amb 118ft with RW        Comments :    4 days from 08/27/19                HALL, PT, LAURA A - 08/27/2019 12:39 EDT  HALL, PT, LAURA A - 08/27/2019 12:39 EDT  HALL, PT, LAURA A - 08/27/2019 12:39 EDT       Plan   PT Frequency Acute :   Daily   Duration :   30    PT Duration Unit Rehab :   Days   Treatments Planned :   Balance training, Bed mobility training, Functional training, Gait training, Stair training, Therapeutic activities,  Therapeutic exercises, Transfer training   Treatment Plan/Goals Established With Patient/Caregiver :   Yes   Other PT Treatment Provided :   pt will be seen 1-2 times day 5-7 days for PT   Evaluation Complete :   Yes   HALL, PT, LAURA A - 08/27/2019 12:39 EDT   Time Spent With Patient   PT Individual Eval Time, Moderate Complexity :   15 minutes   PT Evaluation Units, Moderate Complexity :   1 Unit   PT Treatment Time Comment :   supine to sit to stand, amb to chair, left pt sitting up in chair with needs in reach and nsg updated     PT Functional Training Units :   1 units   PT Functional Training Time :   10 minutes   PT Total Timed Code Treatment Units :   1 units   PT Total Timed Code Min :   10    PT Total Untimed Min :   15    PT Total Treatment Time Acute/OP :   25    HALL, PT, LAURA A - 08/27/2019 12:39 EDT

## 2019-08-28 LAB — CULTURE, VRE, SURVEILLANCE: FINAL REPORT: NO GROWTH

## 2019-08-28 LAB — CULTURE, MRSA, SURVEILLANCE

## 2019-08-28 NOTE — Progress Notes (Deleted)
Cardiology Office Note  Date:  08/28/2019   ID:  Erik Perkins, DOB [birth year], MRN [id number]  PCP:  [facility]   No chief complaint on file.   HPI:  Erik Perkins is a very pleasant 59 year old gentleman with a long history of  smoking,  hyperlipidemia,  hypertension  [facility] on January 21 2010 with stuttering worsening chest discomfort,  troponin elevation 0.33,  cardiac catheterization showing severe 99% distal RCA disease.  DES stent was placed . He presents for routine followup of his coronary artery disease  And new onset leg swelling   Patient reports leg swelling that started about a month ago at work while doing normal activity. He works as Armed forces operational officer. He stopped wearing tall compression socks because these were causing a band from compression in the middle of his calf, made it look like the swelling was worse.    he also has tingling in his feet. He reports no recent change to fluid or sodium intake and denies having DM. Takes Flomax BID. He quit smoking prior to his last MI. He denies any other complications or sick contacts at this time.   Today's Blood pressure 130/74 Prior lab work Total Chol 131/ LDL 60 No new lab work available from the [facility]  EKG personally reviewed by myself on today's visit Shows normal sinus rhythm. 62 bpm.    Labwork provided from the [facility] on his last clinic visit Total cholesterol remains 135 hemoglobin A1c 5.6.   Cardiac catheterization showed 99% distal RCA disease, moderate to severe proximal RCA disease estimated at 60-70%, mild diffuse LAD and left circumflex disease, moderate ostial D2 and D3 disease, moderate distal inferior wall hypokinesis on LV gram. A Xience 3.5 x 12 mm DES stent was placed to his distal RCA.}  PMH:   has a past medical history of Coronary artery disease, Hyperlipidemia, Hypertension, and MI (myocardial infarction) ([place]).  PSH:    Past Surgical History:  Procedure Laterality Date   . CARDIAC CATHETERIZATION    . FRACTURE SURGERY Right    wrist  59yo  . VASECTOMY  1984    Current Outpatient Medications  Medication Sig Dispense Refill  . aspirin 81 MG tablet Take 81 mg by mouth daily.    . clopidogrel (PLAVIX) 75 MG tablet TAKE 1 TABLET DAILY 90 tablet 4  . furosemide (LASIX) 20 MG tablet Take 1 tablet (20 mg total) by mouth daily as needed. 90 tablet 3  . lisinopril (ZESTRIL) 5 MG tablet TAKE 1 TABLET DAILY 90 tablet 0  . metoprolol tartrate (LOPRESSOR) 25 MG tablet Take 12.5 mg by mouth 2 (two) times daily.    . nitroGLYCERIN (NITROSTAT) 0.4 MG SL tablet Place 1 tablet (0.4 mg total) under the tongue every 5 (five) minutes as needed for chest pain. 25 tablet 0  . potassium chloride SA (K-DUR,KLOR-CON) 20 MEQ tablet Take 1 tablet (20 mEq total) by mouth daily as needed. 90 tablet 3  . rosuvastatin (CRESTOR) 40 MG tablet TAKE 1 TABLET DAILY 90 tablet 4  . tamsulosin (FLOMAX) 0.4 MG CAPS capsule Take 1 capsule (0.4 mg total) by mouth 2 (two) times daily.     No current facility-administered medications for this visit.     Allergies:   Other   Social History:  The patient  reports that he quit smoking about 9 years ago. He has a 25.00 pack-year smoking history. He has never used smokeless tobacco. He reports current alcohol use of about 4.0  standard drinks of alcohol per week. He reports that he does not use drugs.   Family History:   family history includes Cancer in his father; Coronary artery disease in his sister; Diabetes in his sister; Hyperlipidemia in his father and sister.    Review of Systems: Review of Systems  Constitutional: Negative.   Respiratory: Negative.   Cardiovascular: Negative.   Gastrointestinal: Negative.   Musculoskeletal: Negative.   Neurological: Negative.   Psychiatric/Behavioral: Negative.   All other systems reviewed and are negative.   PHYSICAL EXAM: VS:  There were no vitals taken for this visit. , BMI There is no height  or weight on file to calculate BMI. GEN: Well nourished, well developed, in no acute distress  HEENT: normal  Neck: no JVD, carotid bruits, or masses Cardiac: RRR; no murmurs, rubs, or gallops,no edema  Respiratory:  clear to auscultation bilaterally, normal work of breathing GI: soft, nontender, nondistended, + BS MS: no deformity or atrophy  Skin: warm and dry, no rash Neuro:  Strength and sensation are intact Psych: euthymic mood, full affect   Recent Labs: 01/17/2019: BUN 20; Creatinine, Ser 1.21; Hemoglobin 14.2; Platelets 222; Potassium 3.7; Sodium 139    Lipid Panel No results found for: CHOL, HDL, LDLCALC, TRIG    Wt Readings from Last 3 Encounters:  01/17/19 215 lb (97.5 kg)  08/25/18 215 lb 12 oz (97.9 kg)  01/06/17 221 lb 4 oz (100.4 kg)      ASSESSMENT AND PLAN:  Atherosclerosis of native coronary artery of native heart with angina pectoris (HCC) - Plan: EKG 12-Lead Denies any anginal symptoms, no further testing at this time  Essential hypertension Blood pressure controlled, no further medication changes  Lower extremity edema Unable to exclude diastolic dysfunction and fluid retention Denies eating out a lot, denies high salt diet or high fluid intake Recommended Lasix 20 as needed with potassium 20 as needed sparingly for leg edema  Smoking hx stopped smoking many years ago.  Denies any shortness of breath symptoms Confirm today he is not smoking  Hyperlipidemia Medications renewed Lab work done through the [place] Discussed LDL goal with him preferably 60 or less  Neuropathy Etiology unclear, nondiabetic Recommend he talk with primary care Happens daytime and nighttime, think it is from his shoes   Total encounter time more than 25 minutes  Greater than 50% was spent in counseling and coordination of care with the patient   Disposition: F/U  12 months   No orders of the defined types were placed in this encounter.   Erik Perkins am  acting as a scribe for Erik Perkins, M.D., Ph.D.  I, Erik Perkins, M.D. Ph.D., have reviewed the above documentation for accuracy and completeness, and I agree with the above.   Signed, Dossie Arbour, M.D., Ph.D. 08/28/2019  [facility] [place], [place] [phone]

## 2019-08-28 NOTE — Progress Notes (Signed)
 Inpatient PT Daily Documentation - Text       Inpatient PT Daily Documentation Entered On:  08/28/2019 10:14 EDT    Performed On:  08/28/2019 10:11 EDT by SHONA, PT, LAURA A               Reason for Treatment   Subjective Statement :   Pt agreeable to amb I didn't get much sleep last night     *Reason for Referral :   pt s/p C5-6 ACDF with post op L side weakness    fall risk, cervical precautions; PMH: previous spinal surgeries     PTA Visit Acute :   PT visit   HALL, PT, LAURA A - 08/28/2019 10:11 EDT   Pain Assessment   Pain Present :   Yes actual or suspected pain   Pain Present :   Neck   HALL, PT, LAURA A - 08/28/2019 10:11 EDT   Mobility   Mobility Grid   Transfer Sit to Stand :   1, Rehab Supervision   Transfer Stand to Sit :   Rehab Supervision, 1   HALL, PT, LAURA A - 08/28/2019 10:11 EDT   Ambulation Level Acute :   Contact guard assistance   Ambulation Quality :   CGA for amb in hallway, very slow cadence, initally step to cadence, progressed to exaggerated step through gait pattern, 1 LOB but pt able to self recover.   Ambulation Distance :   180 ft   Device :   Rolling walker   [place], PT, LAURA A - 08/28/2019 10:11 EDT   Assessment   PT Impairments or Limitations :   Ambulation deficits, Balance deficits, Bed mobility deficits, Pain limiting function, Strength deficits, Transfer deficits   Discharge Recommendations :   eval: acute rehab  08/28/19: home with HHPT (pt has RW at home)     D/CTransportation Recommendations :   No stretcher   D/C Transportation Recommendations Reviewed :   Yes   PT Treatment Recommendations :   PT was able to significnatly improve with functional mobility requiring less A. Pt with increase in distance ambulated. Required verbal cues for step through gait pattern and to relax shoulders as pt tends to lean forward and use his UEs. Rec d/c home with HHPT (pt has RW at home)     [place], PT, LAURA A - 08/28/2019 10:11 EDT   Time Spent With Patient   PT Treatment Time Comment :   sit to  stand, amb in hallway, left pt sitting up in chair with needs in reach and nsg updated     PT Functional Training Units :   3 units   PT Functional Training Time :   40 minutes   PT Total Timed Code Treatment Units :   3 units   PT Total Timed Code Min :   40    PT Total Treatment Time Acute/OP :   40    HALL, PT, LAURA A - 08/28/2019 10:11 EDT

## 2019-08-29 ENCOUNTER — Telehealth: Payer: Self-pay | Admitting: Cardiovascular Disease

## 2019-08-29 ENCOUNTER — Ambulatory Visit: Admitting: Cardiovascular Disease

## 2019-08-29 NOTE — Telephone Encounter (Signed)
Patient cancelled last minute and rescheduled for a few weeks out and would like to have wife present at visit.   Patient voiced frustration at not being told this when scheduling and voiced also not wanting to be charged a fee for late cancellation as he was not aware of the visitor policy until check in at [facility] mall.    Patient made aware of the automated call that explains all of this and more instructions but he did not receive this call because the appt was scheduled late Friday.     Patient asked if he had a cognative impairment or was deaf or blind and replied he was whatever he had to be to have wife come to appt.   Patient cursed throughout the conversation and was made aware clinical would review the request for a visitor before call was disconnected.

## 2019-08-29 NOTE — Progress Notes (Signed)
Rehab Medicine Progress Note               Referral received and patient reviewed.  Patient looks to be an appropriate candidate for acute inpatient rehab.  Spoke to patient about [facility].  Will plan to transfer patient to rehab pending medical stability and bed availability.   Will continue to follow.   Signature Line     Electronically Signed on 08/29/2019 03:43 PM EDT   ________________________________________________   Mertha Finders RN, [place]

## 2019-08-29 NOTE — Progress Notes (Signed)
Inpatient PT Daily Documentation - Text       Inpatient PT Daily Documentation Entered On:  08/29/2019 11:43 EDT    Performed On:  08/29/2019 11:36 EDT by Margo Aye, PT, LAURA A               Reason for Treatment   Subjective Statement :   Pt agreeable to amb to elevator, "It's my dogs birthday"     *Reason for Referral :   pt s/p C5-6 ACDF with post op L side weakness    fall risk, cervical precautions; PMH: previous spinal surgeries     PTA Visit Acute :   PT visit   HALL, PT, LAURA A - 08/29/2019 11:36 EDT   Pain Assessment   Pain Present :   Yes actual or suspected pain   Pain Present :   Back   Self Report Pain :   Numeric rating scale   HALL, PT, LAURA A - 08/29/2019 11:36 EDT   Mobility   Mobility Grid   Transfer Sit to Stand :   1, Rehab Contact guard assistance   Transfer Stand to Sit :   1, Rehab Contact guard assistance   HALL, PT, LAURA A - 08/29/2019 11:36 EDT   Ambulation Level Acute :   Contact guard assistance   Ambulation Quality :   slow cadence, more normalized gait pattern compared to yesterday   Ambulation Distance :   70 ft   Device :   Rolling walker   HALL, PT, LAURA A - 08/29/2019 11:36 EDT   Assessment   PT Impairments or Limitations :   Ambulation deficits, Balance deficits, Bed mobility deficits, Pain limiting function, Strength deficits, Transfer deficits   Discharge Recommendations :   eval: acute rehab  08/28/19: home with HHPT (pt has RW at home)  08/29/19: pt requesting to go to [facility] per MD suggestion     D/CTransportation Recommendations :   No stretcher   D/C Transportation Recommendations Reviewed :   Yes   PT Treatment Recommendations :   Pt did not amb as far 2' to going downstairs to visit his dog. Pt with much improved gait pattern today compared to yesterday. Still demonstrates slightly flexed posture and antalgic gait pattern. Will attempt stairs next visit.      HALL, PT, LAURA A - 08/29/2019 11:36 EDT   Time Spent With Patient   PT Treatment Time Comment :   sit to stand from  wheelchair, amb to elevator, left pt in wheelchair with nsg at elevator     PT Functional Training Units :   1 units   PT Functional Training Time :   15 minutes   PT Total Timed Code Treatment Units :   1 units   PT Total Timed Code Min :   15    PT Total Treatment Time Acute/OP :   15    HALL, PT, LAURA A - 08/29/2019 11:36 EDT

## 2019-08-29 NOTE — Progress Notes (Signed)
Inpatient OT Evaluation - Text       Inpatient OT Evaluation Entered On:  08/29/2019 9:38 EDT    Performed On:  08/29/2019 9:31 EDT by AMMONS, OT, [place]               Reason for Treatment   Subjective Statement :   Evaluation complete     *Reason for Referral :   s/p ACDF     *Chief Complaint :   Trenton Gammon, OT, STEPHEN - 08/29/2019 9:31 EDT   General Information   Occupational Therapy Orders :   OT Evaluation and Treatment Inpatient Acute - 08/26/19 14:37:00 EDT, Stop date 08/26/19 14:37:00 EDT     Precautions RTF :   Precaution Orders  C-Spine Collar - Ordered    -- 08/27/19 11:26:00 EDT, Stop date 08/27/19 11:26:00 EDT, [place] J or [place] c-collar     Pain Present :   Yes actual or suspected pain   Orientation Assessment :   Oriented x 4   Affect/Behavior :   Appropriate, Cooperative   AMMONS, OT, STEPHEN - 08/29/2019 9:31 EDT   Problem List   (As Of: 08/29/2019 09:38:49 EDT)   Problems(Active)    Acute myelopathy (SNOMED CT  :3664403474 )  Name of Problem:   Acute myelopathy ; Recorder:   LEWEY,  JENNIFER LYNN-FNP; Confirmation:   Confirmed ; Classification:   Medical ; Code:   [id number] ; Contributor System:   Conservation officer, nature ; Last Updated:   08/24/2019 9:04 EDT ; Life Cycle Status:   Active ; Responsible Provider:   Juanetta Beets LYNN-FNP; Vocabulary:   SNOMED CT        Back pain (SNOMED CT  :433295188 )  Name of Problem:   Back pain ; Recorder:   PYLE, RN, JOYCE; Confirmation:   Confirmed ; Classification:   Patient Stated ; Code:   416606301 ; Contributor System:   Conservation officer, nature ; Last Updated:   06/28/2019 11:25 EST ; Life Cycle Date:   06/28/2019 ; Life Cycle Status:   Active ; Vocabulary:   SNOMED CT        OSA (obstructive sleep apnea) (SNOMED CT  :601093235 )  Name of Problem:   OSA (obstructive sleep apnea) ; Recorder:   PYLE, RN, JOYCE; Confirmation:   Confirmed ; Classification:   Patient Stated ; Code:   [id number] ; Contributor System:   PowerChart ; Last Updated:   06/28/2019 11:25 EST ; Life  Cycle Date:   06/28/2019 ; Life Cycle Status:   Active ; Vocabulary:   SNOMED CT        Sciatica (SNOMED CT  :27062376 )  Name of Problem:   Sciatica ; Recorder:   PYLE, RN, JOYCE; Confirmation:   Confirmed ; Classification:   Patient Stated ; Code:   [id number] ; Contributor System:   PowerChart ; Last Updated:   06/28/2019 11:25 EST ; Life Cycle Date:   06/28/2019 ; Life Cycle Status:   Active ; Vocabulary:   SNOMED CT          Diagnoses(Active)    Acute myelopathy  Date:   08/24/2019 ; Diagnosis Type:   Discharge ; Confirmation:   Confirmed ; Clinical Dx:   Acute myelopathy ; Classification:   Medical ; Clinical Service:   Non-Specified ; Code:   ICD-10-CM ; Probability:   0 ; Diagnosis Code:   G95.9      Unsteadiness on feet  Date:   08/27/2019 ; Diagnosis Type:  Other ; Confirmation:   Differential ; Clinical Dx:   Unsteadiness on feet ; Classification:   Interdisciplinary ; Clinical Service:   Non-Specified ; Code:   ICD-10-CM ; Probability:   0 ; Diagnosis Code:   R26.81        Pain Assessment   Pain Present :   Yes actual or suspected pain   Pain Present :   Thumb   Laterality :   Left   Quality :   Burning, Tenderness   Self Report Pain :   Numeric rating scale   Numeric Pain Scale :   8   Numeric Pain Score :   8    AMMONS, OT, STEPHEN - 08/29/2019 9:31 EDT   OT Basic ADL   Basic ADL Grid   Eating :   Supervision or setup   Grooming :   Supervision or setup   Bathing :   Contact guard assistance   UE Dressing :   Supervision or setup   LE Dressing :   Contact guard assistance   AMMONS, OT, STEPHEN - 08/29/2019 9:31 EDT   Limiting Factors :   Motor, Pain, Sensory   AMMONS, OT, STEPHEN - 08/29/2019 9:31 EDT   UE Strength/ROM   Upper Extremity Overall ROM Grid   Left Upper Extremity Passive Range :   Within functional limits   Left Upper Extremity Active Range :   Within functional limits   Right Upper Extremity Passive Range :   Within functional limits   Right Upper Extremity Active Range :   Within functional  limits   AMMONS, OT, STEPHEN - 08/29/2019 9:31 EDT   Left Upper Extremity Strength Grid   Shoulder Flexion :   4+   Shoulder Extension :   4+   Elbow Flexion :   4+   Elbow Extension :   4+   Wrist Flexion :   4   Wrist Extension :   4   Finger Flexion :   3+   Finger Extension :   3+   AMMONS, OT, STEPHEN - 08/29/2019 9:31 EDT   Right Upper Extremity Strength Grid   Shoulder Flexion :   5   Shoulder Extension :   5   Elbow Flexion :   5   Elbow Extension :   5   Wrist Flexion :   5   Wrist Extension :   5   Finger Flexion :   5   Finger Extension :   5   AMMONS, OT, STEPHEN - 08/29/2019 9:31 EDT   UE Sensation   Left Sensation Grid   Light Touch :   Impaired   (Comment: dorsal/lateral thumb web space [AMMONS, OT, STEPHEN - 08/29/2019 9:31 EDT] )   AMMONS, OT, STEPHEN - 08/29/2019 9:31 EDT   Right Sensation Grid   Light Touch :   Intact   AMMONS, OT, STEPHEN - 08/29/2019 9:31 EDT   UE Coordination   Left :   21.64   Right :   20.90   AMMONS, OT, STEPHEN - 08/29/2019 9:31 EDT   Left Upper Extremity Coordination Grid   Finger to Nose :   Impaired   Finger Opposition :   Impaired   AMMONS, OT, STEPHEN - 08/29/2019 9:31 EDT   Right Upper Extremty Coordination Grid   Finger to Nose :   Within functional limits   Finger Opposition :   Within functional limits   AMMONS, OT, STEPHEN - 08/29/2019  9:31 EDT   Long Term Goals   OT LT Goals Reviewed :   Yes   AMMONS, OT, STEPHEN - 08/29/2019 9:31 EDT   Outpatient OT Long Term Goals Rehab     Long Term Goal 1  Long Term Goal 2  Long Term Goal 3      Goal :    Maximize Indep with UE/LE ADLs   Maximize Indep with toilet t/f   Maximize L UE FMC/GMC          AMMONS, OT, STEPHEN - 08/29/2019 9:31 EDT  AMMONS, OT, STEPHEN - 08/29/2019 9:31 EDT  AMMONS, OT, STEPHEN - 08/29/2019 9:31 EDT       Short Term Goals   OT ST Goals Reviewed :   Yes   AMMONS, OT, STEPHEN - 08/29/2019 9:31 EDT   Plan   OT Evaluation Date :   08/29/2019 EDT   OT Frequency Acute :   Mo/Tu/We/Th/Fr   Duration :   10    Duration  Unit :   Days   Estimated Hours Per Day :   Other: 15 mins per day   Planned Treatments :   Basic Activities of Daily Living, Coordination, Manual therapy, Mobility training, Neuromuscular reeducation, Pain management, Patient education, Safety education, Therapeutic activities, Therapeutic exercises, Therapeutic exercises for strengthening and ROM   Treatment Plan/Goals Established With Patient/Caregiver :   Yes   OT Evaluation Complete :   Yes   AMMONS, OT, STEPHEN - 08/29/2019 9:31 EDT   Time Spent With Patient   OT Individual Eval Time, Low Complexity :   10 minutes   OT Evaluation Units, Low Complexity :   1 Unit   OT Total Untimed Code Treatment Minutes :   10 minutes   OT Total Treatment Time :   10 minutes   AMMONS, OT, STEPHEN - 08/29/2019 9:31 EDT   AM-PAC Daily Activity   AM-PAC Daily Activity   Putting on and taking off regular lower body clothing? :   A little = 3 points   Bathing (including washing, rinsing, drying)? :   A little = 3 points   Toileting , which includes using toilet, bedpan or urinal? :   A little = 3 points   Putting on and taking off regular upper body clothing? :   A little = 3 points   Taking care of personal grooming such as brushing teeth? :   A little = 3 points   Eating meals? :   A little = 3 points   AMMONS, OT, STEPHEN - 08/29/2019 9:31 EDT   AM-PAC Daily Activity Raw Score :   18    AMMONS, OT, STEPHEN - 08/29/2019 9:31 EDT   Assessment   OT Impairments or Limitations :   Basic activity of daily living deficits, Coordination deficits, Mobility deficits, Pain, Sensory deficits, Strength deficits   Barriers to Safe Discharge OT :   Severity of deficits   OT Discharge Recommendations :   Pending progress with tx.  Pt wants to d/c to acute rehab [facility]).     OT Treatment Recommendations :   Pt limited by L hand weakness/incoordination and hypersensitivity.     AMMONS, OT, STEPHEN - 08/29/2019 9:31 EDT

## 2019-08-29 NOTE — Case Communication (Signed)
CM Discharge Planning Assessment - Text       CM Progress Note Entered On:  08/29/2019 12:26 EDT    Performed On:  08/29/2019 12:25 EDT by Adline Potter, RN, Clearnce Sorrel               CM Progress Note   CM Home/Lay Caregiver Name/Relationship :   Regie Bunner (wife) [phone] (h) [phone]   CM Initial Tentative Discharge Plan :   Home with wife   CM Alternate Discharge Plan :   [facility] or DME if indicated   CM Progress Note :   08/26/19 ROC Reviewed EMR and met with patient and wife in ICU. They live at home and patient states that he is independent and ambulatory at baseline. He has chronic back pain and is on disability due to varying ability to function dependeing on the day, his medication or activity level. He has a cane and CPAP, neither of which he uses. Patient is hopeful to leave the ICU and DC home as soon as possible. No orders or identified needs at this time, but will remain available as needed. M. Ouida Sills     08/27/19 Patient was changed to IPC. Updated wife Baker Janus and reviewed IM. She acknowledged underanding of DC and appeal rights. 08/27/19 1348hrs. Reviewed therapy notes and patient is recommended for AIRF. Emailed AIRF list to wife for review and discussed options. She will plan to discuss with patient today when visiting and follow up with CM for referrals. M. Anderson     03/22: PT'sote states patient will need acute rehab per patient and MD's wishes.  Optiosn given and patient wouild like [employer].  Referral sent. (JLD)     Adline Potter, RN, Clearnce Sorrel - 08/29/2019 12:25 EDT

## 2019-08-30 NOTE — Case Communication (Signed)
CM Discharge Planning Assessment - Text       CM Discharge Planning Ongoing Assessment Entered On:  08/30/2019 16:45 EDT    Performed On:  08/30/2019 16:44 EDT by Duffy Rhody T               Discharge Needs I   Previously Documented Discharge Needs :   DISCHARGE PLAN/NEEDS:  EQUIPMENT/TREATMENT NEEDS:       Previously Documented Benefits Information :   Performed By: Delma Freeze  - 08/30/19 09:40:00       CM Progress Note :   Patient is a 59 year old male who presented to [facility] 08/26/19 to have a scheduled procedure for his neck pain, unsteadiness of gait, and tendency to stumble when he walks or turns briskly.  He was also having weakness in his hands, radicular pain as well.  Patient was being followed by Dr. Alcide Goodness for lower back pain that had not responded to conversative management, with a disc osteophyte at L3-4 on the left.  He had recently received a selective nerve root block at L3-4 and had excellent relief of his pain for 3-4 days, then his pain returned.  At his most recent office visit, he complained of shoulder blade pain on the left, intrascapular pain, and suboccipital headaches.  Dr. Alyce Pagan NP, Anselm Jungling, wanted a cervical and thoracic MRI d/t these new complaints.    08/18/19 MRI cervical spine w/o contrast:  IMPRESSION:      1.  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 No  abnormal cord signal.    2.  At C4-C5, central bulge with mild ventral cord deformity.    3.  Between moderately severe and severe foraminal stenosis on the right at  C3-C4, on the left at C4-C5, bilaterally at C5-C6 and on the left at C6-C7.    It was felt that he would benefit from an ACDF at C5/6.  He admitted 3/19 and had the following scheduled surgery:  C5-C6 ACDF including osteophytectomy; also with small durotomy. Patient had post-operative weakness and numbness.  MRI of cervical spine was obtained post-op:  IMPRESSION: Expected postoperative changes at C5-C6  again with canal stenosis  and similar cord deformity. No cord signal abnormality or concerning fluid  collection.    Per notes, his RUE/RLE muscle strength was completely normal.  His left hand grip was weak, as were his triceps, biceps, and deltoids about a 3.5 out of 5.  His LLE was diffusely weak.  It was felt that this was d/t some form of neuropraxia abnormality or contusion . He was started on steroids and transferred to the ICU post-op.  3/19 CT head/brain showed no evidence of acute intracranial abnormality.  Per Pumonology consult in ICU, patient reported blurry vision of his left eye, left hand grip weakness and paresthesias, left lower extremity weakness and paresthesias.    3/20 Patient's neck pain was somewhat improved.  He was c/o dysesthetic pain in the hand and slightly up the forearm on the left side.  He continued to c/o blurred vision in the left eye and numbness/tingling in his face.  He was also having some spasms in BLE.  It was felt that he would benefit from an MRI or CTA to further evaluate the carotid artery on the right.  Patient was evaluated by therapies, who recommended he transfer to acute inpatient rehab prior to returning home.  He was stable and transferred out of the ICU  to the Neurospine unit.  He continued to improve.  Patient able to wear a c-collar if desired for stability.  3/22 MRA head w/o contrast revealed a normal MRA of the head.  Patient continued to progress with therapies.    3/23 He is eager to transfer to acute inpatient rehab to continue his recovery prior to returning home.      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  PT has MCR primary & lives w/spouse Dondra Spry [phone]).  First team conf tomorrow. KD     Duffy Rhody T - 08/30/2019 16:44 EDT   Discharge Needs II   Discharge Planning Time Spent :   15 minutes   Duffy Rhody T - 08/30/2019 16:44 EDT

## 2019-08-30 NOTE — Case Communication (Signed)
CM Discharge Planning Assessment - Text       CM Discharge Plan Entered On:  09-24-19 9:57 EDT    Performed On:  Sep 24, 2019 9:55 EDT by Adline Potter, RN, [place] Discharge Plan   Discharge To :   Acute Rehab   CM Discharge Services :   Acute Rehab   CM Discharge Service Agency Selected :   [facility]   CM Choice Reason :   Patient preference   Current Equipment and Treatments :   Cane, CPAP   CM Home/Lay Caregiver Name/Relationship :   Romario Tith (wife) [phone] (h) [place] :   [place]     [facility] Medicare Patient :   Yes   CM StCM Statusatus :   IP   CM Progress Note :   08/26/19 ROC Reviewed EMR and met with patient and wife in ICU. They live at home and patient states that he is independent and ambulatory at baseline. He has chronic back pain and is on disability due to varying ability to function dependeing on the day, his medication or activity level. He has a cane and CPAP, neither of which he uses. Patient is hopeful to leave the ICU and DC home as soon as possible. No orders or identified needs at this time, but will remain available as needed. M. Ouida Sills     08/27/19 Patient was changed to IPC. Updated wife Baker Janus and reviewed IM. She acknowledged underanding of DC and appeal rights. 08/27/19 1348hrs. Reviewed therapy notes and patient is recommended for AIRF. Emailed AIRF list to wife for review and discussed options. She will plan to discuss with patient today when visiting and follow up with CM for referrals. M. Anderson     03/22: PT'sote states patient will need acute rehab per patient and MD's wishes.  Optiosn given and patient wouild like [employer].  Referral sent. (JLD)    09-24-2022: Patient has DC order for [facility] today.  Transport time is 1330.  Patient. RN APril and Judson Roch at [facility] are aware of transport time.  Patient states he will call his wife and inform her of the time.  All paperwork faxed to [facility]. (JLD)     Discharge Planning Assessment  Complete :   Yes   Adline Potter RN, Clearnce Sorrel - 09/24/2019 9:55 EDT   Z Codes   Z code documentation :   Not applicable   Adline Potter, RN, Clearnce Sorrel - Sep 24, 2019 9:55 EDT

## 2019-08-30 NOTE — Progress Notes (Signed)
Preadmission Screen Chart Review - Text       Preadmission Screening Chart Review Entered On:  08/30/2019 10:21 EDT    Performed On:  08/30/2019 9:40 EDT by Mertha Finders, RN, [place] Scrape               Data History   Type of Evaluation :   Medical record review onsite   Date of Onset :   08/27/2019 EDT   Referring Physician :   Alcide Goodness,  CURTIS-MD   Review  of Present Illness :   Patient is a 59 year old male who presented to [facility] 08/26/19 to have a scheduled procedure for his neck pain, unsteadiness of gait, and tendency to stumble when he walks or turns briskly.  He was also having weakness in his hands, radicular pain as well.  Patient was being followed by Dr. Alcide Goodness for lower back pain that had not responded to conversative management, with a disc osteophyte at L3-4 on the left.  He had recently received a selective nerve root block at L3-4 and had excellent relief of his pain for 3-4 days, then his pain returned.  At his most recent office visit, he complained of shoulder blade pain on the left, intrascapular pain, and suboccipital headaches.  Dr. Alyce Pagan NP, Anselm Jungling, wanted a cervical and thoracic MRI d/t these new complaints.    08/18/19 MRI cervical spine w/o contrast:  IMPRESSION:      1.  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 No  abnormal cord signal.    2.  At C4-C5, central bulge with mild ventral cord deformity.    3.  Between moderately severe and severe foraminal stenosis on the right at  C3-C4, on the left at C4-C5, bilaterally at C5-C6 and on the left at C6-C7.    It was felt that he would benefit from an ACDF at C5/6.  He admitted 3/19 and had the following scheduled surgery:  C5-C6 ACDF including osteophytectomy; also with small durotomy. Patient had post-operative weakness and numbness.  MRI of cervical spine was obtained post-op:  IMPRESSION: Expected postoperative changes at C5-C6 again with canal stenosis  and similar cord deformity. No cord signal  abnormality or concerning fluid  collection.    Per notes, his RUE/RLE muscle strength was completely normal.  His left hand grip was weak, as were his triceps, biceps, and deltoids about a 3.5 out of 5.  His LLE was diffusely weak.  It was felt that this was d/t some form of neuropraxia abnormality or contusion . He was started on steroids and transferred to the ICU post-op.  3/19 CT head/brain showed no evidence of acute intracranial abnormality.  Per Pumonology consult in ICU, patient reported blurry vision of his left eye, left hand grip weakness and paresthesias, left lower extremity weakness and paresthesias.    3/20 Patient's neck pain was somewhat improved.  He was c/o dysesthetic pain in the hand and slightly up the forearm on the left side.  He continued to c/o blurred vision in the left eye and numbness/tingling in his face.  He was also having some spasms in BLE.  It was felt that he would benefit from an MRI or CTA to further evaluate the carotid artery on the right.  Patient was evaluated by therapies, who recommended he transfer to acute inpatient rehab prior to returning home.  He was stable and transferred out of the ICU to the Neurospine unit.  He continued to improve.  Patient able to wear a c-collar if desired for stability.  3/22 MRA head w/o contrast revealed a normal MRA of the head.  Patient continued to progress with therapies.    3/23 He is eager to transfer to acute inpatient rehab to continue his recovery prior to returning home.      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  Tobacco:  never     Jesse Savage - 08/30/2019 9:40 EDT   Data   Preadmission Data Results :   Rehab Preadmission Data  Body Mass Index est meas: 24.86 kg/m2 (08/26/19 06:29:22)  Body Mass Index est meas: 26.22 kg/m2 (08/25/19 09:34:03)  Height/Length Measured: 175.3 cm (08/26/19 06:27:00)  Height/Length Measured: 172.7 cm (08/25/19 09:21:00)  Weight Measured: 76.4 kg (08/26/19 06:27:00)  Weight Measured: 78.2 kg (08/25/19 09:21:00)     Weight Estimated :   76.4 kg(Converted to: 168 lb 7 oz, 168.433 lb)    Height/Length Estimated :   175.3 cm(Converted to: 5 ft 9 in, 69.02 in)    Body Mass Index Estimated :   24.86 kg/m2   Current IV :   Yes   IV Details :   #18 right forearm   Current PICC Line :   No   PPD Test :   No   Isolation Precautions :   Standard   Mertha Finders RNDayton Scrape - 08/30/2019 9:40 EDT   Preadm Precautions Grid   Fall :   Yes   Spinal :   Yes   Transfer/Mobility Limitations :   Yes   Daugherty, RN, [place] Scrape - 08/30/2019 9:40 EDT   Behavioral Problems :   None   Dialysis :   None   Diet Type :   Regular   Daugherty, RN, [place] Scrape - 08/30/2019 9:40 EDT   Lab Values, Diagnostics, Vital Signs   Lab Results RTF :   BUN: 16 mg/dL (16/10/96 04:54:09)  Creatinine Lvl: 0.8 mg/dL (81/19/14 78:29:56)  HCT: 40 % (08/27/19 03:09:00)  Hgb: 12.9 g/dL Low (21/30/86 57:84:69)  WBC: 9.3 x10e3/mcL (08/27/19 03:09:00)  Potassium Lvl: 3.7 mmol/L (08/27/19 03:09:00)  Sodium Lvl: 136 mmol/L (08/27/19 03:09:00)     Vital Signs Results :   Rehab Vital Signs Grouper  Diastolic Blood Pressure: 85 mmHg (08/30/19 07:55:00)  Diastolic Blood Pressure: 72 mmHg (08/30/19 05:22:00)  Diastolic Blood Pressure: 92 mmHg High (08/30/19 00:21:00)  Diastolic Blood Pressure: 85 mmHg (08/29/19 20:39:00)  Diastolic Blood Pressure: 80 mmHg (08/29/19 17:46:00)  Diastolic Blood Pressure: 78 mmHg (08/29/19 14:06:00)  Diastolic Blood Pressure: 78 mmHg (08/29/19 09:41:00)  Diastolic Blood  Pressure: 65 mmHg (08/29/19 05:01:00)  Diastolic Blood Pressure: 78 mmHg (08/28/19 23:49:00)  Diastolic Blood Pressure: 66 mmHg (08/28/19 20:53:00)  Diastolic Blood Pressure: 89 mmHg (08/28/19 16:18:00)  Diastolic Blood Pressure: 72 mmHg (08/28/19 12:00:00)  Heart Rate Monitored: 72 bpm (08/30/19 07:55:00)  Heart Rate Monitored: 63 bpm (08/30/19 00:21:00)  Heart Rate Monitored: 89 bpm (08/29/19 17:46:00)  Heart Rate Monitored: 84 bpm (08/29/19 14:06:00)  Heart Rate Monitored:  83 bpm (08/29/19 09:41:00)  Heart Rate Monitored: 71 bpm (08/29/19 05:01:00)  Heart Rate Monitored: 66 bpm (08/28/19 23:49:00)  Heart Rate Monitored: 62 bpm (08/28/19 20:53:00)  Heart Rate Monitored: 76 bpm (08/28/19 16:18:00)  Mean Arterial Pressure, Cuff: 97 mmHg (08/30/19 07:55:00)  Mean Arterial Pressure, Cuff: 81 mmHg (08/30/19 05:22:00)  Mean Arterial Pressure, Cuff: 107 mmHg High (08/30/19 00:21:00)  Mean Arterial Pressure, Cuff: 109 mmHg High (08/29/19 20:39:00)  Mean Arterial Pressure, Cuff: 81 mmHg (08/29/19 05:01:00)  Mean Arterial Pressure, Cuff: 88 mmHg (08/28/19 23:49:00)  Mean Arterial Pressure, Cuff: 83 mmHg (08/28/19 20:53:00)  Mean Arterial Pressure, Cuff: 96 mmHg (08/28/19 16:18:00)  Mean Arterial Pressure, Cuff: 87 mmHg (08/28/19 12:00:00)  Peripheral Pulse Rate: 72 bpm (08/30/19 07:55:00)  Peripheral Pulse Rate: 64 bpm (08/30/19 05:22:00)  Peripheral Pulse Rate: 65 bpm (08/30/19 00:21:00)  Peripheral Pulse Rate: 69 bpm (08/29/19 20:39:00)  Peripheral Pulse Rate: 73 bpm (08/29/19 05:01:00)  Peripheral Pulse Rate: 60 bpm (08/28/19 23:49:00)  Peripheral Pulse Rate: 61 bpm (08/28/19 20:53:00)  Peripheral Pulse Rate: 81 bpm (08/28/19 16:18:00)  Peripheral Pulse Rate: 56 bpm Low (08/28/19 12:00:00)  Respiratory Rate: 16 br/min (08/30/19 07:55:00)  Respiratory Rate: 18 br/min (08/30/19 05:22:00)  Respiratory Rate: 18 br/min (08/30/19 00:21:00)  Respiratory Rate: 18 br/min (08/29/19 20:39:00)  Respiratory Rate: 18 br/min (08/29/19  17:46:00)  Respiratory Rate: 16 br/min (08/29/19 17:42:00)  Respiratory Rate: 18 br/min (08/29/19 14:06:00)  Respiratory Rate: 16 br/min (08/29/19 13:13:00)  Respiratory Rate: 16 br/min (08/29/19 11:35:00)  Respiratory Rate: 16 br/min (08/29/19 09:41:00)  Respiratory Rate: 16 br/min (08/29/19 06:01:00)  Respiratory Rate: 16 br/min (08/29/19 05:01:00)  Respiratory Rate: 16 br/min (08/28/19 23:49:00)  Respiratory Rate: 16 br/min (08/28/19 20:53:00)  Respiratory Rate: 16 br/min (08/28/19 17:47:00)  Respiratory Rate: 16 br/min (08/28/19 16:18:00)  Respiratory Rate: 16 br/min (08/28/19 12:32:00)  Respiratory Rate: 16 br/min (08/28/19 12:00:00)  Respiratory Rate: 16 br/min (08/28/19 10:55:00)  Systolic Blood Pressure: 111 mmHg (08/30/19 07:55:00)  Systolic Blood Pressure: 110 mmHg (08/30/19 05:22:00)  Systolic Blood Pressure: 138 mmHg (08/30/19 00:21:00)  Systolic Blood Pressure: 137 mmHg (08/29/19 20:39:00)  Systolic Blood Pressure: 134 mmHg (08/29/19 17:46:00)  Systolic Blood Pressure: 123 mmHg (08/29/19 14:06:00)  Systolic Blood Pressure: 125 mmHg (08/29/19 09:41:00)  Systolic Blood Pressure: 124 mmHg (08/29/19 05:01:00)  Systolic Blood Pressure: 114 mmHg (08/28/19 23:49:00)  Systolic Blood Pressure: 109 mmHg (08/28/19 20:53:00)  Systolic Blood Pressure: 117 mmHg (08/28/19 16:18:00)  Systolic Blood Pressure: 106 mmHg (08/28/19 12:00:00)  Temperature Oral: 36.7 degC (08/30/19 07:55:00)  Temperature Oral: 36.6 degC (08/30/19 05:22:00)  Temperature Oral: 36.6 degC (08/30/19 00:21:00)  Temperature Oral: 36.6 degC (08/29/19 20:39:00)  Temperature Oral: 36.7 degC (08/29/19 17:46:00)  Temperature Oral: 36.2 degC (08/29/19 14:06:00)  Temperature Oral: 36.8 degC (08/29/19 09:41:00)  Temperature Oral: 36.6 degC (08/29/19 05:01:00)  Temperature Oral: 36.7 degC (08/28/19 23:49:00)  Temperature Oral: 36.5 degC (08/28/19 20:53:00)  Temperature Oral: 36.5 degC (08/28/19 16:18:00)     Mertha Finders RN, [place] Scrape - 08/30/2019 9:40 EDT    Respiratory   Respiratory Status Results :   Rehab Respiratory Grouper  Chest Motion: Symmetrical (08/30/19 09:00:00)  Chest Motion: Symmetrical (08/29/19 21:00:00)  Chest Motion: Symmetrical (08/29/19 08:00:00)  Chest Motion: Symmetrical (08/28/19 21:00:00)  Chest Motion: Symmetrical (08/28/19 08:00:00)  Chest Motion: Symmetrical (08/27/19 14:00:00)  Respirations: Unlabored (08/30/19 09:00:00)  Respirations: Unlabored (08/29/19 21:00:00)  Respirations: Unlabored (08/29/19 08:00:00)  Respirations: Unlabored (08/28/19 21:00:00)  Respirations: Unlabored (08/28/19 08:00:00)  Respirations: Unlabored (08/27/19 20:00:00)  Respirations: Unlabored (08/27/19 14:00:00)  Respirations: Unlabored (  08/27/19 11:00:00)  Respirations: Unlabored (08/27/19 09:00:00)  Respirations: Unlabored (08/27/19 08:47:00)  Respirations: Unlabored (08/27/19 07:00:00)  Respirations: Unlabored (08/27/19 04:00:00)  Respirations: Unlabored (08/27/19 00:00:00)  Respirations: Unlabored (08/26/19 20:00:00)  Respirations: Unlabored (08/26/19 14:00:00)  Respirations: Unlabored (08/26/19 13:19:00)  Respirations: Unlabored (08/26/19 11:39:00)  Respirations: Unlabored (08/26/19 06:47:00)  Respiratory Pattern: Regular (08/30/19 09:00:00)  Respiratory Pattern: Regular (08/29/19 21:00:00)  Respiratory Pattern: Regular (08/29/19 08:00:00)  Respiratory Pattern: Regular (08/28/19 21:00:00)  Respiratory Pattern: Regular (08/28/19 08:00:00)  Respiratory Pattern: Regular (08/27/19 20:00:00)  Respiratory Pattern: Regular (08/27/19 14:00:00)  Respiratory Pattern: Regular (08/27/19 11:00:00)  Respiratory Pattern: Regular (08/27/19 09:00:00)  Respiratory Pattern: Regular (08/27/19 08:47:00)  Respiratory Pattern: Regular (08/27/19 07:00:00)  Respiratory Pattern: Regular (08/27/19 04:00:00)  Respiratory Pattern: Regular (08/27/19 00:00:00)  Respiratory Pattern: Regular (08/26/19 20:00:00)  Respiratory Pattern: Regular (08/26/19 14:00:00)  Respiratory Pattern: Regular  (08/26/19 13:19:00)  Respiratory Pattern: Regular (08/26/19 11:39:00)  Respiratory Pattern: Regular (08/26/19 06:47:00)  Respiratory Symptoms: None (08/30/19 09:00:00)  Respiratory Symptoms: None (08/29/19 21:00:00)  Respiratory Symptoms: None (08/29/19 08:00:00)  Respiratory Symptoms: None (08/28/19 21:00:00)  Respiratory Symptoms: None (08/28/19 08:00:00)  Respiratory Symptoms: None (08/27/19 20:00:00)  Respiratory Symptoms: None (08/27/19 14:00:00)  Respiratory Symptoms: None (08/27/19 08:47:00)  Respiratory Symptoms: None (08/27/19 04:00:00)  Respiratory Symptoms: None (08/27/19 00:00:00)  Respiratory Symptoms: None (08/26/19 20:00:00)  Respiratory Symptoms: None (08/26/19 14:00:00)  Shortness of Breath Indicator: No shortness of breath (08/25/19 09:22:00)  Symptoms of Sleep Apnea: Hypertension, Loud snoring, Male Gender (08/25/19 09:22:00)  Symptoms of Sleep Apnea Score: 3 (08/25/19 09:22:00)     Respiratory Needs :   Room air   Daugherty, RN, [place] Scrape - 08/30/2019 9:40 EDT   Bowel and Bladder   Premorbid Bladder Management :   Continent   Premorbid Urinary Elimination :   Voiding, no difficulties   Urinary Elimination :   Voiding, no difficulties   Bladder Management :   Continent   Bowel Results :   Rehab Bowel and Bladder Grouper   No qualifying data available.     Premorbid Bowel Management :   Continent   Bowel Management :   Continent   Ostomy :   No   Last Bowel Movement :   08/28/2019 EDT   Daugherty, RN, [place] Scrape - 08/30/2019 9:40 EDT   Wound   Incision Wound Results :   Rehab Incision/Wound Information  Neck Right Anterior - Incision, Wound Activity: Assessed (08/30/19 09:00:00)  Neck Right Anterior - Incision, Wound Activity: Assessed (08/29/19 21:00:00)  Neck Right Anterior - Incision, Wound Activity: Assessed (08/29/19 08:00:00)  Neck Right Anterior - Incision, Wound Activity: Assessed (08/28/19 21:00:00)  Neck Right Anterior - Incision, Wound Activity: Assessed (08/28/19 08:00:00)  Neck Right Anterior  - Incision, Wound Activity: Assessed (08/27/19 20:00:00)  Neck Right Anterior - Incision, Wound Dressing Activity: Assessed (08/29/19 21:00:00)  Neck Right Anterior - Incision, Wound Dressing Activity: Assessed (08/28/19 21:00:00)  Neck Right Anterior - Incision, Wound Dressing Activity: Assessed (08/26/19 11:39:00)  Neck Right Anterior - Incision, Wound Dressing Assessment: Intact, Loose (08/29/19 21:00:00)  Neck Right Anterior - Incision, Wound Dressing Assessment: Intact, Loose (08/28/19 21:00:00)  Neck Right Anterior - Incision, Wound Dressing Assessment: Drainage present (08/27/19 08:00:00)  Neck Right Anterior - Incision, Wound Dressing Assessment: Drainage present (08/26/19 20:00:00)  Neck Right Anterior - Incision, Wound Dressing Assessment: Drainage present (08/26/19 14:00:00)  Neck Right Anterior - Incision, Wound Dressing Assessment: Drainage present (08/26/19 13:19:00)  Neck Right Anterior - Incision, Wound Dressing Assessment: Clean, Dry, Intact (  08/26/19 11:39:00)  Neck Right Anterior - Incision, Wound Dressing: Open to air (08/30/19 09:00:00)  Neck Right Anterior - Incision, Wound Dressing: Open to air (08/29/19 21:00:00)  Neck Right Anterior - Incision, Wound Dressing: Open to air (08/29/19 08:00:00)  Neck Right Anterior - Incision, Wound Dressing: Open to air (08/28/19 21:00:00)  Neck Right Anterior - Incision, Wound Dressing: Open to air (08/28/19 08:00:00)  Neck Right Anterior - Incision, Wound Dressing: Open to air (08/27/19 20:00:00)  Neck Right Anterior - Incision, Wound Dressing: Open to air (08/27/19 14:00:00)  Neck Right Anterior - Incision, Wound Dressing: Tape (08/27/19 08:00:00)  Neck Right Anterior - Incision, Wound Dressing: Tape (08/26/19 20:00:00)  Neck Right Anterior - Incision, Wound Dressing: Tape (08/26/19 14:00:00)  Neck Right Anterior - Incision, Wound Dressing: Transparent dressing (08/26/19 11:39:00)  Neck Right Anterior - Skin Abnormality Type: Surgical incision (08/30/19  09:00:00)  Neck Right Anterior - Skin Abnormality Type: Surgical incision (08/29/19 21:00:00)  Neck Right Anterior - Skin Abnormality Type: Surgical incision (08/29/19 08:00:00)  Neck Right Anterior - Skin Abnormality Type: Surgical incision (08/28/19 21:00:00)  Neck Right Anterior - Skin Abnormality Type: Surgical incision (08/28/19 08:00:00)  Neck Right Anterior - Skin Abnormality Type: Surgical incision (08/27/19 20:00:00)  Neck Right Anterior - Skin Abnormality Type: Surgical incision (08/27/19 14:00:00)  Neck Right Anterior - Skin Abnormality Type: Surgical incision (08/27/19 08:00:00)  Neck Right Anterior - Skin Abnormality Type: Surgical incision (08/26/19 20:00:00)  Neck Right Anterior - Skin Abnormality Type: Surgical incision (08/26/19 14:00:00)  Neck Right Anterior - Skin Abnormality Type: Surgical incision (08/26/19 11:39:00)  Neck Right Anterior - Wound Associated Pain: With activity, mobilization (08/30/19 09:00:00)  Neck Right Anterior - Wound Associated Pain: With activity, mobilization (08/29/19 08:00:00)  Neck Right Anterior - Wound Associated Pain: With activity, mobilization (08/28/19 08:00:00)  Neck Right Anterior - Wound Associated Pain: With activity, mobilization (08/27/19 20:00:00)  Neck Right Anterior - Wound Associated Pain: With activity, mobilization (08/27/19 14:00:00)  Neck Right Anterior - Wound Edge: Approximated with steri strips (08/30/19 09:00:00)  Neck Right Anterior - Wound Edge: Approximated with steri strips (08/29/19 21:00:00)  Neck Right Anterior - Wound Edge: Approximated with steri strips (08/29/19 08:00:00)  Neck Right Anterior - Wound Edge: Approximated with steri strips (08/28/19 21:00:00)  Neck Right Anterior - Wound Edge: Approximated with steri strips (08/28/19 08:00:00)  Neck Right Anterior - Wound Edge: Approximated with steri strips (08/27/19 20:00:00)  Neck Right Anterior - Wound Edge: Approximated with steri strips (08/27/19 14:00:00)  Neck Right Anterior -  Wound Edge: Approximated with steri strips (08/26/19 11:39:00)  Neck Right Anterior - Wound Exudate Amount: None (08/30/19 09:00:00)  Neck Right Anterior - Wound Exudate Amount: None (08/29/19 21:00:00)  Neck Right Anterior - Wound Exudate Amount: None (08/29/19 08:00:00)  Neck Right Anterior - Wound Exudate Amount: None (08/28/19 21:00:00)  Neck Right Anterior - Wound Exudate Amount: None (08/28/19 08:00:00)  Neck Right Anterior - Wound Exudate Amount: Small (08/27/19 20:00:00)  Neck Right Anterior - Wound Exudate Amount: Small (08/27/19 14:00:00)  Neck Right Anterior - Wound Exudate Amount: Small (08/27/19 08:00:00)  Neck Right Anterior - Wound Exudate Amount: Small (08/26/19 20:00:00)  Neck Right Anterior - Wound Exudate Amount: Small (08/26/19 14:00:00)  Neck Right Anterior - Wound Exudate Type: Sanguineous (08/27/19 20:00:00)  Neck Right Anterior - Wound Exudate Type: Sanguineous (08/27/19 14:00:00)  Neck Right Anterior - Wound Exudate Type: Sanguineous (08/27/19 08:00:00)  Neck Right Anterior - Wound Exudate Type: Sanguineous (08/26/19 20:00:00)  Neck Right Anterior -  Wound Exudate Type: Sanguineous (08/26/19 14:00:00)     Mertha Finders, RN, [place] Scrape - 08/30/2019 9:40 EDT   Functional Status   Languages :   [place]   Preferred Mode of Communication :   Verbal, Written   Supports in Hospital? :   Glasses   Functional Status Results :   Rehab Functional Status Grouper  ADL Index Score: 10 (08/30/19 09:00:00)  ADL Index Score: 10 (08/29/19 21:00:00)  ADL Index Score: 9 (08/29/19 08:00:00)  ADL Index Score: 9 (08/28/19 21:00:00)  ADL Index Score: 8 (08/28/19 08:00:00)  ADL Index Score: 8 (08/27/19 20:00:00)  ADL Index Score: 8 (08/27/19 14:00:00)  ADL Index Score: 7 (08/27/19 08:00:00)  ADL Index Score: 7 (08/26/19 20:00:00)  ADL Index Score: 7 (08/26/19 14:00:00)  ADLs: Minimal assist (08/30/19 09:00:00)  ADLs: Minimal assist (08/29/19 21:00:00)  ADLs: Minimal assist (08/29/19 08:00:00)  ADLs: Minimal assist (08/28/19  21:00:00)  ADLs: Minimal assist (08/28/19 08:00:00)  ADLs: Moderate assist (08/27/19 20:00:00)  ADLs: Moderate assist (08/27/19 14:00:00)  ADLs: Moderate assist (08/27/19 08:00:00)  ADLs: Moderate assist (08/26/19 20:00:00)  ADLs: Moderate assist (08/26/19 14:00:00)  Ambulation Assistance: Standby assistance (08/29/19 22:18:00)  Ambulation Assistance: Standby assistance (08/29/19 20:39:00)  Ambulation Assistance: Standby assistance (08/29/19 19:29:00)  Ambulation Assistance: Standby assistance (08/29/19 05:01:00)  Ambulation Assistance: Standby assistance (08/29/19 02:49:00)  Ambulation Assistance: Standby assistance (08/28/19 22:53:00)  Ambulation Assistance: Standby assistance (08/28/19 21:24:00)  Ambulation Assistance: Standby assistance (08/28/19 06:30:00)  Ambulation Assistance: Standby assistance (08/27/19 21:00:00)  Ambulation Distance: 320 ft (08/29/19 22:18:00)  Ambulation Distance: 70 ft (08/29/19 11:36:00)  Ambulation Distance: 160 ft (08/28/19 22:53:00)  Ambulation Distance: 160 ft (08/28/19 21:24:00)  Ambulation Distance: 150 ft (08/28/19 17:50:00)  Ambulation Distance: 180 ft (08/28/19 10:11:00)  Ambulation Distance: 100 ft (08/28/19 06:30:00)  Ambulation Distance: 50 ft (08/27/19 21:00:00)  Ambulation Distance: 4 ft (08/27/19 12:39:00)  Antiembolism Device: Intermittent pneumatic compression devices, knee high (08/30/19 07:55:00)  Antiembolism Device: Intermittent pneumatic compression devices, knee high (08/30/19 07:20:00)  Antiembolism Device: Intermittent pneumatic compression devices, knee high (08/30/19 05:22:00)  Antiembolism Device: Intermittent pneumatic compression devices, knee high (08/30/19 01:06:00)  Antiembolism Device: Intermittent pneumatic compression devices, knee high (08/29/19 22:18:00)  Antiembolism Device: Intermittent pneumatic compression devices, knee high (08/29/19 21:17:00)  Antiembolism Device: Intermittent pneumatic compression devices, knee high (08/29/19  20:39:00)  Antiembolism Device: Intermittent pneumatic compression devices, knee high (08/29/19 19:29:00)  Antiembolism Device: Intermittent pneumatic compression devices, knee high (08/29/19 10:34:00)  Antiembolism Device: Intermittent pneumatic compression devices, knee high (08/29/19 07:00:00)  Antiembolism Device: Intermittent pneumatic compression devices, knee high (08/29/19 05:01:00)  Antiembolism Device: Intermittent pneumatic compression devices, knee high (08/29/19 02:49:00)  Antiembolism Device: Intermittent pneumatic compression devices, knee high (08/28/19 13:00:00)  Antiembolism Device: Intermittent pneumatic compression devices, knee high (08/28/19 12:43:00)  Antiembolism Device: Intermittent pneumatic compression devices, knee high (08/28/19 11:00:00)  Antiembolism Device: Intermittent pneumatic compression devices, knee high (08/28/19 10:48:00)  Antiembolism Device: Intermittent pneumatic compression devices, knee high (08/27/19 21:00:00)  Antiembolism Device: Intermittent pneumatic compression devices, knee high (08/27/19 19:00:00)  Antiembolism Device: Intermittent pneumatic compression devices, knee high (08/27/19 19:00:00)  Antiembolism Device: Intermittent pneumatic compression devices, knee high (08/27/19 15:00:00)  Antiembolism Device: Intermittent pneumatic compression devices, knee high (08/27/19 14:00:00)  Antiembolism Device: Intermittent pneumatic compression devices, knee high (08/27/19 13:08:00)  Antiembolism Device: Intermittent pneumatic compression devices, knee high (08/27/19 12:09:00)  Antiembolism Device: Intermittent pneumatic compression devices, knee high (08/27/19 11:53:00)  Antiembolism Device: Intermittent pneumatic compression devices, knee high (08/27/19 10:41:00)  Antiembolism Device: Intermittent pneumatic compression  devices, knee high (08/27/19 09:42:00)  Antiembolism Device: Intermittent pneumatic compression devices, knee high (08/27/19 08:28:00)  Antiembolism  Device: Intermittent pneumatic compression devices, knee high (08/27/19 07:15:00)  Antiembolism Device: Intermittent pneumatic compression devices, knee high (08/27/19 06:00:00)  Antiembolism Device: Intermittent pneumatic compression devices, knee high (08/27/19 04:00:00)  Antiembolism Device: Intermittent pneumatic compression devices, knee high (08/27/19 02:00:00)  Antiembolism Device: Intermittent pneumatic compression devices, knee high (08/27/19 00:00:00)  Antiembolism Device: Intermittent pneumatic compression devices, knee high (08/26/19 22:00:00)  Antiembolism Device: Intermittent pneumatic compression devices, knee high (08/26/19 20:00:00)  Antiembolism Device: Intermittent pneumatic compression devices, knee high (08/26/19 18:06:00)  Antiembolism Device: Intermittent pneumatic compression devices, knee high (08/26/19 17:06:00)  Antiembolism Device: Intermittent pneumatic compression devices, knee high (08/26/19 16:16:00)  Antiembolism Device: Intermittent pneumatic compression devices, knee high (08/26/19 15:55:00)  Antiembolism Device: Intermittent pneumatic compression devices, knee high (08/26/19 14:26:00)  Antiembolism Device: Intermittent pneumatic compression devices, knee high (08/26/19 13:37:00)  Antiembolism Device: Intermittent pneumatic compression devices, knee high (08/26/19 11:39:00)  Antiembolism Device: Intermittent pneumatic compression devices, knee high (08/26/19 06:00:00)  Antiembolism Device Removal Reason: Activity (08/30/19 07:55:00)  Antiembolism Device Removal Reason: Activity (08/30/19 07:20:00)  Antiembolism Device Removal Reason: Activity (08/30/19 01:06:00)  Antiembolism Device Removal Reason: Activity (08/29/19 22:18:00)  Antiembolism Device Removal Reason: Activity (08/29/19 21:17:00)  Antiembolism Device Removal Reason: Activity (08/29/19 19:29:00)  Antiembolism Device Removal Reason: Activity (08/29/19 10:34:00)  Antiembolism Device Removal Reason: Activity (08/27/19  21:00:00)  Antiembolism Device Removal Reason: Activity (08/27/19 19:00:00)  Antiembolism Device Removal Reason: Activity (08/27/19 19:00:00)  Antiembolism Device Removal Reason: Activity (08/27/19 06:00:00)  Antiembolism Device Removal Reason: Activity (08/27/19 04:00:00)  Antiembolism Device Removal Reason: Activity (08/27/19 02:00:00)  Antiembolism Device Removal Reason: Activity (08/27/19 00:00:00)  Antiembolism Device Removal Reason: Activity (08/26/19 22:00:00)  Assistive Device: Walker (08/30/19 07:20:00)  Assistive Device: Dan Humphreys (08/30/19 05:22:00)  Assistive Device: Dan Humphreys (08/29/19 22:18:00)  Assistive Device: Dan Humphreys (08/29/19 20:39:00)  Assistive Device: Dan Humphreys (08/29/19 19:29:00)  Assistive Device: Dan Humphreys (08/29/19 10:34:00)  Assistive Device: Dan Humphreys (08/29/19 07:00:00)  Assistive Device: Dan Humphreys (08/29/19 02:49:00)  Assistive Device: Dan Humphreys (08/29/19 01:36:00)  Assistive Device: Dan Humphreys (08/28/19 23:49:00)  Assistive Device: Dan Humphreys (08/28/19 22:53:00)  Assistive Device: Dan Humphreys (08/28/19 21:24:00)  Assistive Device: Dan Humphreys (08/28/19 10:04:00)  Assistive Device: Dan Humphreys (08/28/19 06:30:00)  Assistive Device: Dan Humphreys (08/27/19 21:00:00)  Assistive Device: Dan Humphreys (08/27/19 17:36:00)  Assistive Device: Dan Humphreys (08/27/19 16:00:00)  Assistive Device: Sit to Stand Aid (08/27/19 06:00:00)  Assistive Device: Sit to Stand Aid (08/27/19 04:00:00)  Assistive Device: Sit to Stand Aid (08/27/19 02:00:00)  Assistive Device: Sit to Stand Aid (08/27/19 00:00:00)  Assistive Device: Sit to Stand Aid (08/26/19 22:00:00)  Assistive Device: None (08/26/19 06:00:00)  Bathing ADL Index: Requires assistance (1) (08/30/19 09:00:00)  Bathing ADL Index: Requires assistance (1) (08/29/19 21:00:00)  Bathing ADL Index: Requires assistance (1) (08/29/19 08:00:00)  Bathing ADL Index: Requires assistance (1) (08/28/19 21:00:00)  Bathing ADL Index: Requires assistance (1) (08/28/19 08:00:00)  Bathing ADL Index: Requires assistance (1)  (08/27/19 20:00:00)  Bathing ADL Index: Requires assistance (1) (08/27/19 14:00:00)  Bathing ADL Index: Requires assistance (1) (08/27/19 08:00:00)  Bathing ADL Index: Requires assistance (1) (08/26/19 20:00:00)  Bathing ADL Index: Requires assistance (1) (08/26/19 14:00:00)  Bed Mobility Assistance: Independent (08/30/19 07:55:00)  Bed Mobility Assistance: Independent (08/30/19 07:20:00)  Bed Mobility Assistance: Independent (08/30/19 06:24:00)  Bed Mobility Assistance: Independent (08/30/19 05:22:00)  Bed Mobility Assistance: Independent (08/29/19 20:39:00)  Bed Mobility Assistance: Independent (08/29/19 18:08:00)  Bed Mobility Assistance: Independent (08/29/19 17:46:00)  Bed Mobility Assistance:  Independent (08/29/19 15:30:00)  Bed Mobility Assistance: Independent (08/29/19 14:06:00)  Bed Mobility Assistance: Independent (08/29/19 10:34:00)  Bed Mobility Assistance: Independent (08/29/19 09:41:00)  Bed Mobility Assistance: Independent (08/29/19 07:00:00)  Bed Mobility Assistance: Independent (08/29/19 05:01:00)  Bed Mobility Assistance: Independent (08/29/19 02:56:00)  Bed Mobility Assistance: Independent (08/28/19 13:00:00)  Bed Mobility Assistance: Independent (08/28/19 12:43:00)  Bed Mobility Assistance: Independent (08/28/19 11:00:00)  Bed Mobility Assistance: Independent (08/28/19 10:48:00)  Bed Mobility Assistance: One person assistance (08/27/19 22:00:00)  Bed Mobility Assistance: One person assistance (08/27/19 21:00:00)  Bed Mobility Assistance: One person assistance (08/27/19 19:00:00)  Bed Mobility Assistance: One person assistance (08/27/19 19:00:00)  Bed Mobility Assistance: One person assistance (08/27/19 13:08:00)  Bed Mobility Assistance: One person assistance (08/27/19 12:09:00)  Bed Mobility Assistance: Two person assistance (08/27/19 11:53:00)  Bed Mobility Assistance: One person assistance (08/27/19 10:41:00)  Bed Mobility Assistance: One person assistance (08/27/19 09:42:00)  Bed Mobility  Assistance: One person assistance (08/27/19 08:28:00)  Bed Mobility Assistance: One person assistance (08/27/19 07:15:00)  Bed Mobility Assistance: One person assistance (08/27/19 06:00:00)  Bed Mobility Assistance: One person assistance (08/27/19 04:00:00)  Bed Mobility Assistance: One person assistance (08/27/19 02:00:00)  Bed Mobility Assistance: Two person assistance (08/27/19 00:00:00)  Bed Mobility Assistance: Two person assistance (08/26/19 22:00:00)  Bed Mobility Assistance: Two person assistance (08/26/19 20:00:00)  Bed Mobility Assistance: Two person assistance (08/26/19 18:06:00)  Bed Mobility Assistance: Two person assistance (08/26/19 17:06:00)  Bed Mobility Assistance: Two person assistance (08/26/19 16:16:00)  Bed Mobility Assistance: Two person assistance (08/26/19 15:55:00)  Bed Mobility Assistance: Two person assistance (08/26/19 14:26:00)  Bed Mobility Assistance: Two person assistance (08/26/19 13:37:00)  Continence ADL Index: Independent (2) (08/30/19 09:00:00)  Continence ADL Index: Independent (2) (08/29/19 21:00:00)  Continence ADL Index: Independent (2) (08/29/19 08:00:00)  Continence ADL Index: Independent (2) (08/28/19 21:00:00)  Continence ADL Index: Independent (2) (08/28/19 08:00:00)  Continence ADL Index: Independent (2) (08/27/19 20:00:00)  Continence ADL Index: Independent (2) (08/27/19 14:00:00)  Continence ADL Index: Requires assistance (1) (08/27/19 08:00:00)  Continence ADL Index: Requires assistance (1) (08/26/19 20:00:00)  Continence ADL Index: Requires assistance (1) (08/26/19 14:00:00)  Dressing ADL Index: Requires assistance (1) (08/30/19 09:00:00)  Dressing ADL Index: Requires assistance (1) (08/29/19 21:00:00)  Dressing ADL Index: Requires assistance (1) (08/29/19 08:00:00)  Dressing ADL Index: Requires assistance (1) (08/28/19 21:00:00)  Dressing ADL Index: Requires assistance (1) (08/28/19 08:00:00)  Dressing ADL Index: Requires assistance (1) (08/27/19  20:00:00)  Dressing ADL Index: Requires assistance (1) (08/27/19 14:00:00)  Dressing ADL Index: Requires assistance (1) (08/27/19 08:00:00)  Dressing ADL Index: Requires assistance (1) (08/26/19 20:00:00)  Dressing ADL Index: Requires assistance (1) (08/26/19 14:00:00)  Environmental Safety Implemented: Wheels locked, Call device within reach, Personal items within reach, Fall risk visual cues in place, Adequate room lighting, Non-slip footwear, Patient specific safety measures, Traffic path in room free of clutter (08/30/19 07:55:00)  Environmental Safety Implemented: Wheels locked, Call device within reach, Personal items within reach, Bed exit alarm, Adequate room lighting, Non-slip footwear (08/30/19 07:20:00)  Environmental Safety Implemented: Bed in low position, Wheels locked, Call device within reach, Personal items within reach, Bed exit alarm, Adequate room lighting, Non-slip footwear (08/30/19 05:22:00)  Environmental Safety Implemented: Wheels locked, Call device within reach, Personal items within reach, Fall risk visual cues in place, Adequate room lighting, Traffic path in room free of clutter (08/30/19 01:06:00)  Environmental Safety Implemented: Wheels locked, Call device within reach, Personal items within reach, Fall risk visual cues in place, Adequate room lighting,  Traffic path in room free of clutter (08/30/19 00:21:00)  Environmental Safety Implemented: Wheels locked, Call device within reach, Personal items within reach, Fall risk visual cues in place, Adequate room lighting, Traffic path in room free of clutter (08/29/19 22:18:00)  Environmental Safety Implemented: Wheels locked, Call device within reach, Personal items within reach, Fall risk visual cues in place, Adequate room lighting, Traffic path in room free of clutter (08/29/19 21:17:00)  Environmental Safety Implemented: Bed in low position, Wheels locked, Call device within reach, Personal items within reach, Bed exit alarm,  Adequate room lighting, Non-slip footwear, Patient specific safety measures, Traffic path in room free of clutter (08/29/19 20:39:00)  Environmental Safety Implemented: Wheels locked, Call device within reach, Personal items within reach, Fall risk visual cues in place, Attend patient with toileting, Traffic path in room free of clutter (08/29/19 19:29:00)  Environmental Safety Implemented: Wheels locked, Call device within reach, Personal items within reach, Bed exit alarm, Adequate room lighting, Non-slip footwear (08/29/19 18:08:00)  Environmental Safety Implemented: Wheels locked, Call device within reach, Personal items within reach, Bed exit alarm, Adequate room lighting, Non-slip footwear (08/29/19 17:46:00)  Environmental Safety Implemented: Wheels locked, Call device within reach, Personal items within reach, Bed exit alarm, Adequate room lighting, Non-slip footwear (08/29/19 15:30:00)  Environmental Safety Implemented: Wheels locked, Call device within reach, Personal items within reach, Bed exit alarm, Adequate room lighting, Non-slip footwear (08/29/19 14:06:00)  Environmental Safety Implemented: Wheels locked, Call device within reach, Personal items within reach, Adequate room lighting, Non-slip footwear (08/29/19 11:35:00)  Environmental Safety Implemented: Wheels locked, Call device within reach, Personal items within reach, Adequate room lighting, Non-slip footwear (08/29/19 10:34:00)  Environmental Safety Implemented: Bed in low position, Wheels locked, Call device within reach, Personal items within reach, Bed exit alarm, Adequate room lighting, Non-slip footwear (08/29/19 09:41:00)  Environmental Safety Implemented: Bed in low position, Wheels locked, Call device within reach, Personal items within reach, Bed exit alarm, Adequate room lighting, Non-slip footwear (08/29/19 07:00:00)  Environmental Safety Implemented: Bed in low position, Wheels locked, Call device within reach, Personal items  within reach, Bed exit alarm, Fall risk visual cues in place, Adequate room lighting, Traffic path in room free of clutter (08/29/19 05:01:00)  Environmental Safety Implemented: Bed in low position, Wheels locked, Call device within reach, Personal items within reach, Fall risk visual cues in place, Adequate room lighting, Traffic path in room free of clutter (08/29/19 02:49:00)  Data processing manager Implemented: Wheels locked, Call device within reach, Personal items within reach, Fall risk visual cues in place, Adequate room lighting, Traffic path in room free of clutter (08/29/19 01:36:00)  Data processing manager Implemented: Wheels locked, Call device within reach, Personal items within reach, Fall risk visual cues in place, Adequate room lighting, Traffic path in room free of clutter (08/28/19 23:49:00)  Data processing manager Implemented: Wheels locked, Call device within reach, Personal items within reach, Fall risk visual cues in place, Adequate room lighting, Attend patient with toileting, Traffic path in room free of clutter (08/28/19 22:53:00)  Environmental Safety Implemented: Wheels locked, Call device within reach, Personal items within reach, Fall risk visual cues in place, Adequate room lighting, Traffic path in room free of clutter (08/28/19 22:38:00)  Environmental Safety Implemented: Wheels locked, Call device within reach, Personal items within reach, Fall risk visual cues in place, Adequate room lighting, Traffic path in room free of clutter (08/28/19 21:24:00)  Environmental Safety Implemented: Wheels locked, Call device within reach, Personal items within reach, Fall risk visual cues in place,  Adequate room lighting, Traffic path in room free of clutter (08/28/19 20:53:00)  Environmental Safety Implemented: Wheels locked, Call device within reach, Personal items within reach, Fall risk visual cues in place, Adequate room lighting, Traffic path in room free of clutter (08/28/19  19:28:00)  Environmental Safety Implemented: Wheels locked, Call device within reach, Personal items within reach, Fall risk visual cues in place, Adequate room lighting, Non-slip footwear (08/28/19 18:11:00)  Environmental Safety Implemented: Wheels locked, Call device within reach, Personal items within reach, Fall risk visual cues in place, Adequate room lighting, Non-slip footwear (08/28/19 17:50:00)  Environmental Safety Implemented: Wheels locked, Call device within reach, Personal items within reach, Fall risk visual cues in place, Adequate room lighting, Non-slip footwear (08/28/19 16:19:00)  Environmental Safety Implemented: Wheels locked, Call device within reach, Personal items within reach, Fall risk visual cues in place, Adequate room lighting, Non-slip footwear (08/28/19 15:00:00)  Environmental Safety Implemented: Wheels locked, Call device within reach, Personal items within reach, Fall risk visual cues in place, Adequate room lighting, Non-slip footwear (08/28/19 14:00:00)  Environmental Safety Implemented: Bed in low position, Wheels locked, Call device within reach, Personal items within reach, Bed exit alarm, Fall risk visual cues in place, Adequate room lighting, Non-slip footwear, Traffic path in room free of clutter (08/28/19 13:00:00)  Environmental Safety Implemented: Bed in low position, Wheels locked, Call device within reach, Personal items within reach, Bed exit alarm, Fall risk visual cues in place, Adequate room lighting, Non-slip footwear, Traffic path in room free of clutter (08/28/19 12:43:00)  Environmental Safety Implemented: Bed in low position, Wheels locked, Call device within reach, Personal items within reach, Bed exit alarm, Fall risk visual cues in place, Adequate room lighting, Non-slip footwear, Traffic path in room free of clutter (08/28/19 11:00:00)  Environmental Safety Implemented: Bed in low position, Wheels locked, Call device within reach, Personal items within  reach, Bed exit alarm, Traffic path in room free of clutter (08/28/19 10:48:00)  Environmental Safety Implemented: Wheels locked, Call device within reach, Personal items within reach, Fall risk visual cues in place, Adequate room lighting, Non-slip footwear, Traffic path in room free of clutter (08/28/19 10:04:00)  Environmental Safety Implemented: Wheels locked, Call device within reach, Personal items within reach, Fall risk visual cues in place, Adequate room lighting, Non-slip footwear, Traffic path in room free of clutter (08/28/19 09:00:00)  Environmental Safety Implemented: Wheels locked, Call device within reach, Personal items within reach, Fall risk visual cues in place, Adequate room lighting, Non-slip footwear, Traffic path in room free of clutter (08/28/19 08:06:00)  Environmental Safety Implemented: Wheels locked, Call device within reach, Personal items within reach, Portable alarm, Fall risk visual cues in place, Adequate room lighting, Non-slip footwear, Traffic path in room free of clutter (08/28/19 07:00:00)  Environmental Safety Implemented: Bed in low position, Wheels locked, Call device within reach, Personal items within reach, Non-slip footwear, Night light (08/28/19 06:45:00)  Environmental Safety Implemented: Wheels locked, Call device within reach, Personal items within reach, Non-slip footwear (08/28/19 06:30:00)  Environmental Safety Implemented: Bed in low position, Wheels locked, Call device within reach, Personal items within reach, Fall risk visual cues in place, Non-slip footwear, Night light (08/28/19 05:32:00)  Environmental Safety Implemented: Bed in low position, Wheels locked, Call device within reach, Personal items within reach, Non-slip footwear, Night light (08/28/19 04:00:00)  Environmental Safety Implemented: Bed in low position, Wheels locked, Call device within reach, Personal items within reach, Fall risk visual cues in place, Non-slip footwear, Night light (08/28/19  02:00:00)  Environmental Safety Implemented: Bed in low position, Wheels locked, Personal items within reach, Fall risk visual cues in place, Non-slip footwear, Night light (08/28/19 01:06:00)  Environmental Safety Implemented: Bed in low position, Wheels locked, Call device within reach, Personal items within reach, Fall risk visual cues in place, Non-slip footwear, Night light (08/28/19 01:00:00)  Environmental Safety Implemented: Bed in low position, Wheels locked, Personal items within reach, Fall risk visual cues in place, Non-slip footwear, Night light (08/27/19 23:00:00)  Environmental Safety Implemented: Bed in low position, Wheels locked, Personal items within reach, Fall risk visual cues in place, Non-slip footwear, Night light (08/27/19 22:00:00)  Environmental Safety Implemented: Wheels locked, Call device within reach, Personal items within reach, Portable alarm, Fall risk visual cues in place, Adequate room lighting, Non-slip footwear, Patient specific safety measures, Traffic path in room free of clutter (08/27/19 21:00:00)  Environmental Safety Implemented: Wheels locked, Call device within reach, Personal items within reach, Portable alarm, Fall risk visual cues in place, Adequate room lighting, Non-slip footwear, Patient specific safety measures, Traffic path in room free of clutter (08/27/19 19:00:00)  Environmental Safety Implemented: Wheels locked, Call device within reach, Personal items within reach, Portable alarm, Fall risk visual cues in place, Adequate room lighting, Non-slip footwear, Patient specific safety measures, Traffic path in room free of clutter (08/27/19 19:00:00)  Environmental Safety Implemented: Bed in low position, Wheels locked, Call device within reach, Personal items within reach, Bed exit alarm, Fall risk visual cues in place, Adequate room lighting, Non-slip footwear, Traffic path in room free of clutter (08/27/19 18:38:00)  Environmental Safety Implemented: Bed in low  position, Wheels locked, Call device within reach, Personal items within reach, Bed exit alarm, Fall risk visual cues in place, Adequate room lighting, Non-slip footwear, Traffic path in room free of clutter (08/27/19 17:36:00)  Environmental Safety Implemented: Bed in low position, Wheels locked, Call device within reach, Personal items within reach, Bed exit alarm, Fall risk visual cues in place, Adequate room lighting, Non-slip footwear, Traffic path in room free of clutter (08/27/19 16:39:00)  Environmental Safety Implemented: Bed in low position, Wheels locked, Call device within reach, Personal items within reach, Bed exit alarm, Fall risk visual cues in place, Adequate room lighting, Non-slip footwear, Traffic path in room free of clutter (08/27/19 15:00:00)  Environmental Safety Implemented: Bed in low position, Wheels locked, Call device within reach, Personal items within reach, Bed exit alarm, Fall risk visual cues in place, Adequate room lighting, Non-slip footwear, Traffic path in room free of clutter (08/27/19 14:00:00)  Environmental Safety Implemented: Bed in low position, Wheels locked, Call device within reach, Personal items within reach, Bed exit alarm, Fall risk visual cues in place, Non-slip footwear, Traffic path in room free of clutter (08/27/19 13:08:00)  Environmental Safety Implemented: Bed in low position, Wheels locked, Call device within reach, Personal items within reach, Bed exit alarm, Fall risk visual cues in place, Non-slip footwear, Traffic path in room free of clutter (08/27/19 12:09:00)  Environmental Safety Implemented: Bed in low position, Wheels locked, Call device within reach, Personal items within reach, Bed exit alarm, Fall risk visual cues in place, Non-slip footwear, Traffic path in room free of clutter (08/27/19 11:53:00)  Environmental Safety Implemented: Bed in low position, Wheels locked, Call device within reach, Personal items within reach, Bed exit alarm, Fall  risk visual cues in place, Non-slip footwear, Traffic path in room free of clutter (08/27/19 10:41:00)  Environmental Safety Implemented: Bed in low position, Wheels locked, Call device within reach,  Personal items within reach, Bed exit alarm, Fall risk visual cues in place, Non-slip footwear, Traffic path in room free of clutter (08/27/19 09:42:00)  Environmental Safety Implemented: Bed in low position, Wheels locked, Call device within reach, Personal items within reach, Bed exit alarm, Fall risk visual cues in place, Non-slip footwear, Traffic path in room free of clutter (08/27/19 08:28:00)  Environmental Safety Implemented: Bed in low position, Wheels locked, Call device within reach, Personal items within reach, Bed exit alarm, Fall risk visual cues in place, Non-slip footwear, Traffic path in room free of clutter (08/27/19 07:15:00)  Environmental Safety Implemented: Bed in low position, Wheels locked, Call device within reach (08/27/19 06:00:00)  Environmental Safety Implemented: Bed in low position, Wheels locked, Call device within reach (08/27/19 04:00:00)  Environmental Safety Implemented: Bed in low position, Wheels locked, Call device within reach (08/27/19 02:00:00)  Environmental Safety Implemented: Bed in low position, Wheels locked, Call device within reach (08/27/19 00:00:00)  Environmental Safety Implemented: Bed in low position, Wheels locked, Call device within reach (08/26/19 22:00:00)  Environmental Safety Implemented: Bed in low position, Wheels locked, Call device within reach (08/26/19 20:00:00)  Environmental Safety Implemented: Bed in low position, Wheels locked, Call device within reach (08/26/19 18:06:00)  Environmental Safety Implemented: Bed in low position, Wheels locked, Call device within reach (08/26/19 17:06:00)  Environmental Safety Implemented: Bed in low position, Wheels locked, Call device within reach (08/26/19 16:16:00)  Environmental Safety Implemented: Bed in low  position, Wheels locked, Call device within reach (08/26/19 15:55:00)  Environmental Safety Implemented: Bed in low position, Wheels locked, Call device within reach (08/26/19 14:26:00)  Environmental Safety Implemented: Bed in low position, Wheels locked, Call device within reach (08/26/19 13:37:00)  Environmental Safety Implemented: Bed in low position, Wheels locked, Call device within reach (08/26/19 06:00:00)  Feeding ADL Index: Independent (2) (08/30/19 09:00:00)  Feeding ADL Index: Independent (2) (08/29/19 21:00:00)  Feeding ADL Index: Independent (2) (08/29/19 08:00:00)  Feeding ADL Index: Independent (2) (08/28/19 21:00:00)  Feeding ADL Index: Independent (2) (08/28/19 08:00:00)  Feeding ADL Index: Independent (2) (08/27/19 20:00:00)  Feeding ADL Index: Independent (2) (08/27/19 14:00:00)  Feeding ADL Index: Independent (2) (08/27/19 08:00:00)  Feeding ADL Index: Independent (2) (08/26/19 20:00:00)  Feeding ADL Index: Independent (2) (08/26/19 14:00:00)  Head of Bed Position: 80 - 90 Degrees (high Fowlers) (08/30/19 07:55:00)  Head of Bed Position: 80 - 90 Degrees (high Fowlers) (08/30/19 07:20:00)  Head of Bed Position: 30 - 45 Degrees (semi-Fowlers) (08/30/19 06:24:00)  Head of Bed Position: 30 - 45 Degrees (semi-Fowlers) (08/30/19 05:22:00)  Head of Bed Position: 30 - 45 Degrees (semi-Fowlers) (08/29/19 20:39:00)  Head of Bed Position: 30 - 45 Degrees (semi-Fowlers) (08/29/19 18:08:00)  Head of Bed Position: 30 - 45 Degrees (semi-Fowlers) (08/29/19 17:46:00)  Head of Bed Position: 80 - 90 Degrees (high Fowlers) (08/29/19 15:30:00)  Head of Bed Position: 30 - 45 Degrees (semi-Fowlers) (08/29/19 14:06:00)  Head of Bed Position: 80 - 90 Degrees (high Fowlers) (08/29/19 10:34:00)  Head of Bed Position: 30 - 45 Degrees (semi-Fowlers) (08/29/19 09:41:00)  Head of Bed Position: 30 - 45 Degrees (semi-Fowlers) (08/29/19 07:00:00)  Head of Bed Position: 30 Degrees or less (08/29/19 05:01:00)  Head of Bed  Position: 30 - 45 Degrees (semi-Fowlers) (08/29/19 02:56:00)  Head of Bed Position: 30 - 45 Degrees (semi-Fowlers) (08/29/19 02:49:00)  Head of Bed Position: 30 - 45 Degrees (semi-Fowlers) (08/28/19 13:00:00)  Head of Bed Position: 30 - 45 Degrees (semi-Fowlers) (08/28/19 12:43:00)  Head of  Bed Position: 30 - 45 Degrees (semi-Fowlers) (08/28/19 11:00:00)  Head of Bed Position: 45 - 80 Degrees (08/28/19 10:48:00)  Head of Bed Position: 30 - 45 Degrees (semi-Fowlers) (08/27/19 22:00:00)  Head of Bed Position: 45 - 80 Degrees (08/27/19 21:00:00)  Head of Bed Position: 45 - 80 Degrees (08/27/19 19:00:00)  Head of Bed Position: 45 - 80 Degrees (08/27/19 19:00:00)  Head of Bed Position: 45 - 80 Degrees (08/27/19 13:08:00)  Head of Bed Position: 45 - 80 Degrees (08/27/19 12:09:00)  Head of Bed Position: 45 - 80 Degrees (08/27/19 11:53:00)  Head of Bed Position: 30 - 45 Degrees (semi-Fowlers) (08/27/19 10:41:00)  Head of Bed Position: 30 - 45 Degrees (semi-Fowlers) (08/27/19 09:42:00)  Head of Bed Position: 30 - 45 Degrees (semi-Fowlers) (08/27/19 08:28:00)  Head of Bed Position: 30 - 45 Degrees (semi-Fowlers) (08/27/19 07:15:00)  Head of Bed Position: 30 - 45 Degrees (semi-Fowlers) (08/27/19 06:00:00)  Head of Bed Position: 30 - 45 Degrees (semi-Fowlers) (08/27/19 04:00:00)  Head of Bed Position: 30 - 45 Degrees (semi-Fowlers) (08/27/19 02:00:00)  Head of Bed Position: 45 - 80 Degrees (08/27/19 00:00:00)  Head of Bed Position: 45 - 80 Degrees (08/26/19 22:00:00)  Head of Bed Position: 30 Degrees or less (08/26/19 20:00:00)  Head of Bed Position: 30 - 45 Degrees (semi-Fowlers) (08/26/19 18:06:00)  Head of Bed Position: 30 - 45 Degrees (semi-Fowlers) (08/26/19 17:06:00)  Head of Bed Position: 30 - 45 Degrees (semi-Fowlers) (08/26/19 16:16:00)  Head of Bed Position: 30 - 45 Degrees (semi-Fowlers) (08/26/19 15:55:00)  Head of Bed Position: 30 - 45 Degrees (semi-Fowlers) (08/26/19 14:26:00)  Head of Bed Position: 30 - 45  Degrees (semi-Fowlers) (08/26/19 13:37:00)  Patient Identified: Identification band, Verbal (08/26/19 13:19:00)  Patient Identified: Identification band, Verbal (08/26/19 13:09:00)  Patient Identified: Identification band (08/26/19 11:39:00)  Patient Identified: Identification band, Verbal (08/26/19 06:47:00)  Patient Position: Sitting in chair (08/30/19 07:55:00)  Patient Position: Sitting in chair (08/30/19 07:20:00)  Patient Position: Left side - lying, Head of bed elevated (08/30/19 06:24:00)  Patient Position: Right side - lying, Head of bed elevated (08/30/19 05:22:00)  Patient Position: Sitting in chair (08/30/19 01:06:00)  Patient Position: Sitting in chair (08/30/19 00:21:00)  Patient Position: Sitting in chair (08/29/19 22:18:00)  Patient Position: Sitting in chair (08/29/19 21:17:00)  Patient Position: Sitting in chair (08/29/19 20:39:00)  Patient Position: Sitting in chair (08/29/19 19:29:00)  Patient Position: Sitting in chair (08/29/19 18:08:00)  Patient Position: Sitting in chair (08/29/19 17:46:00)  Patient Position: Sitting in chair (08/29/19 15:30:00)  Patient Position: Supine, Head of bed elevated (08/29/19 14:06:00)  Patient Position: Sitting in chair (08/29/19 10:34:00)  Patient Position: Supine, Head of bed elevated (08/29/19 09:41:00)  Patient Position: Supine, Head of bed elevated (08/29/19 07:00:00)  Patient Position: Left side - lying, Head of bed elevated (08/29/19 05:01:00)  Patient Positi     Jesse FreezeDaugherty, RN, Sarah E - 08/30/2019 9:40 EDT   Premorbid Medication Self Management   Bathing :   Complete independence   Bed Mobility :   Complete independence   Bed Wheelchair Transfer :   Does not occur   Bladder :   Complete independence   Bowel :   Complete independence   Eating :   Complete independence   Grooming :   Complete independence   Locomotion Walk :   Complete independence   Locomotion Wheelchair :   Does not occur   Lower Extremity Dressing :   Complete independence   Sit to Stand :  Complete independence   Supine to Sit :   Complete independence   Toilet Transfer :   Complete independence   Toileting :   Complete independence   Tub, Shower Transfer :   Complete independence   Upper Extremity Dressing :   Complete independence   Comprehension :   Complete independence   Expression :   Complete independence   Cognition :   Complete independence   Social Interaction :   Complete independence   Medication Self Management :   Complete independence   Mertha Finders, RN, [place] Scrape - 08/30/2019 9:40 EDT   Current Medication Self-Management   Bathing :   Minimal contact assistance   Bed Mobility :   Minimal contact assistance   Bed Wheelchair Transfer :   Does not occur   Bladder :   Supervision or setup   Bowel :   Supervision or setup   Eating :   Supervision or setup   Grooming :   Supervision or setup   Locomotion Walk :   Minimal contact assistance   Locomotion Wheelchair :   Does not occur   Lower Extremity Dressing :   Minimal contact assistance   Sit to Stand :   Minimal contact assistance   Supine to Sit :   Minimal contact assistance   Toilet Transfer :   Minimal contact assistance   Toileting :   Supervision or setup   Tub, shower transfer :   Does not occur   Upper Extremity Dressing :   Minimal contact assistance   Comprehension :   Complete independence   Expression :   Complete independence   Cognition :   Complete independence   Social Interaction :   Complete independence   Medication Self-Management :   Does not occur   Daugherty, RN, [place] Scrape - 08/30/2019 9:40 EDT   Anticipated Rehab Goals   Bathing :   Modified independence   Bed Mobility :   Complete independence   Bed Wheelchair Transfer :   Does not occur   Bladder :   Complete independence   Bowel :   Complete independence   Eating :   Complete independence   Grooming :   Complete independence   Locomotion Walk :   Modified independence   Locomotion Wheelchair :   Does not occur   Lower Extremity Dressing :   Modified independence   Sit  to Stand :   Modified independence   Supine to Sit :   Complete independence   Toilet Transfer :   Modified independence   Toileting :   Complete independence   Tub, Shower Transfer :   Modified independence   Upper Extremity Dressing :   Modified independence   Comprehension :   Complete independence   Expression :   Complete independence   Cognition :   Complete independence   Social Interaction :   Complete independence   Medication Self-Management :   Complete independence   Mertha Finders RN, [place] Scrape - 08/30/2019 9:40 EDT   Section GG: Functional Abilities and Goals   Functional Abilities and Goals Grid   GG0100 Self Care :   Independent - Patient/Resident completed the activities by him/herself, with or without an assistive device, with no assistance from a helper. - 3   GG0100 Indoor Mobility (Ambulation) :   Independent - Patient/Resident completed the activities by him/herself, with or without an assistive device, with no assistance from a helper. - 3  GG0100 Stairs :   Independent - Patient/Resident completed the activities by him/herself, with or without an assistive device, with no assistance from a helper. - 3   GG0100 Functional Cognition :   Independent - Patient/Resident completed the activities by him/herself, with or without an assistive device, with no assistance from a helper. 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, its from previous surgeries (08/27/19 12:39:00)  Lives In: Single level home (08/27/19 12:39:00)  Lives With: Alone (08/27/19 12:39:00)  Living Situation: Home independently (08/27/19 12:39:00)  Prior ADL Status: Independent (08/27/19 12:39:00)  Prior Mobility Status: Independent (08/27/19 12:39:00)     Pre-Hospital Living Setting :    Home - private home/apartment   Admit Lives With IRF-PAI :   Family or relatives   Lives In :   Single level home   Anticipated Need for Home Modification :   No   Prior Devices/Aids used by Patient :   Agricultural consultant, Recruitment consultant, Shower chair, Bedside commode   Daugherty, RN, [place] Scrape 08/30/2019 9:40 EDT   Rehabilitation Stairs Grid     Inside Stairs  Outside Stairs        Number of Stairs :    1    3            Rail :       [place], RNDayton Scrape - 08/30/2019 9:40 EDT  Mertha Finders, RN, [place] Scrape - 08/30/2019 9:40 EDT        Home Setup Grid   Primary Bedroom :   1st floor   Primary Bathroom :   1st floor   Kitchen :   1st floor   Laundry :   1st floor   Jesse Savage 08/30/2019 9:40 EDT   Patient's Responsibilities Rehab :   Community mobility, Disabled, Hospital doctor, Landscape architect, Financial planner, Health and wellness, Home management, Leisure/Play/Hobbies, Manage Medications, Meal preparation, Out to eat, Personal ADL, Shopping, Social participation   Patient's Lifestyle :   Active   Preadm Support System :   Pt lives w/ spouse Cruise Baumgardner [phone].  She will be there to provide support/assistance upon discharge home from rehab.   Preadm Amt/Type of Assist Caregiver Able to Prov :   Bathing, Dressing, Meals, Medication management, Safety monitoring, Taking to follow up appointment, Toileting, Wound care   Culture/Spiritual Beliefs to Incorporate :   No   Daugherty, RN, [place] Scrape - 08/30/2019 9:40 EDT   Plan   Risk for Clinical Complications and Medical Necessity for Inpatient Acute Rehabilitation Care :   Patient requires medical management/24-hour nursing of complex   comorbidities (acute myelopathy, OSA, sciatica, chronic   back pain), labs, medications (see medications list), pain, sleep   hygiene, anticoagulation, nutrition, hydration, neurological,   pulmonary, and cardiac status, and preventive healthcare.    Patient is at risk for the following complications:  DVT/PE, pneumonia,   malnutrition, neurological decline, respiratory insufficiency,   worsening activity intolerance, complications from anticoagulation,   skin breakdown, inadequate sleep, , and constipation.       Preadm Patient, Caregiver Goal :  return to PLOF   Barriers to Safe Discharge :   Medical instability   Preadm Expected Level of Improvement :   Good   Preadm Expected Lvl of Improvement Details :   MOD I   Preadm Patient/Family Agreement :   Yes   Rehab Expected Length of Stay :   8 days   Therapy Activity Tolerance :   3 hours over 5 days   Preadm PT Hours/Day :   1.5   Preadm PT Interventions :   Ambulation, Body mechanics, Endurance, Family training, Mobility, Safety, Stairs, Transfers   Preadm OT Hours/Day :   1.5   Preadm OT Interventions :   Bathing, Body mechanics, Dressing, Endurance, Family training, Grooming, Safety, Toileting, Transfers   Rehab Nursing Care :   24/7   Jesse Savage 08/30/2019 9:40 EDT   Additional Information   Preadm Additional Information :   SSN [id number]     Jesse Savage - 08/30/2019 9:40 EDT   Benefits   Insurance Information :   Medicare   Daugherty, RN, [place] Scrape - 08/30/2019 9:40 EDT   Preadmission Review   Appropriate for Inpatient Rehab Hospital Admission :   Willing to participate in prescribed intensity of care, Able to actively participate in 3hrs/5 times per wk therapy service, Medical conditions can be managed in the inpt rehab unit, Lower level of care is not appropriate for patient, Patient requires an intensive level of rehab services   Estimated Length of Stay :   7 to 10 days   Rationale for Admission to Inpt Rehab :   Requires intensive therapy and integrated interdisciplinary approach to address safety, impaired mobility, impaired ADLs, and medication management due to cervical myelopathy status post ACDF with course complicated by worsening left-sided weakness.     Preadm Prognosis :   Good   Requires Inpatient Rehab Services :    Physical Therapy, Occupational Therapy, Nursing, Case Management, Registered Dietician, Social Work   Anticipated Discharge Destination :   Home under care of organized home health service org   Anticipated Discharge Services :   Physical Therapy, Occupational Therapy, Nursing   Physician Review of Preadmission Screening :   I agree with the preadmission screening doc revisions needed   Alcide Clever M-MD - 08/30/2019 11:05 EDT   Expected Admit Date to IRF :   08/30/2019 EDT   Funding reviewed with Pt/Caregiver :   Yes   Daugherty, RN, [place] Scrape - 08/30/2019 9:40 EDT

## 2019-08-30 NOTE — Procedures (Signed)
 IntraOp Record - [facility]             IntraOp Record - [facility] Summary                                                                   Primary Physician:        VIKI FRIEDMAN    Case Number:              [id number]    Finalized Date/Time:      08/30/19 21:29:10    Pt. Name:                 Jesse Savage, Jesse Savage    D.O.B./Sex:               December 07, 1960    Male    Med Rec #:                [id number]    Physician:                VIKI FRIEDMAN    Financial #:              [id number]    Pt. Type:                 I    Room/Bed:                 0507/01    Admit/Disch:              08/27/19 08:28:00 -                              08/30/19 14:23:24    Institution:       [facility] - Case Times                                                                                                         Entry 1                                                                                                          Patient      In Room Time             08/26/19 07:56:00               Out Room Time  08/26/19 11:38:00    Anesthesia     Procedure      Start Time               08/26/19 08:40:00               Stop Time                       08/26/19 11:26:00    Last Modified By:         Glendora OBIE Sharlet JINNY                              08/26/19 11:38:31      S[facility] - Case Times Audit                                                                          08/26/19 11:38:31         Owner: REBA                               Modifier: HASTPA                                                        <+> 1         Out Room Time     08/26/19 11:27:35         Owner: HASTPA                               Modifier: HASTPA                                                        <+> 1         Stop Time        S[facility] - Case Attendance                                                                                                    Entry 1                         Entry 2                         Entry 3  Case Attendee             [place],  CURTIS-MD         [place],  MELISSA                DEBBY ADE L-MD                                                              [facility]    Role Performed            Surgeon Primary                 First Assistant                 Anesthesiologist    Time In                   08/26/19 07:56:00               08/26/19 07:56:00               08/26/19 07:56:00    Time Out                  08/26/19 11:38:00               08/26/19 11:38:00               08/26/19 11:38:00    Procedure                 Anterior Cervical               Anterior Cervical               Anterior Cervical                              Discectomy with Fusion          Discectomy with Fusion          Discectomy with Fusion    Last Modified By:         Glendora, RN, Sharlet JINNY Glendora, RN, Sharlet JINNY Glendora, RNSharlet JINNY                              08/26/19 11:38:33               08/26/19 11:38:33               08/26/19 11:38:33                                Entry 4                         Entry 5                         Entry 6  Case Attendee             [facility],  JANINE M-CRNA          Glendora, RN, Sharlet JINNY Lennie Irving J    Role Performed            CRNA                            Circulator                      Surgical Scrub    Time In                   08/26/19 07:56:00               08/26/19 07:56:00               08/26/19 07:56:00    Time Out                  08/26/19 11:38:00               08/26/19 11:38:00               08/26/19 11:38:00    Procedure                 Anterior Cervical               Anterior Cervical               Anterior Cervical                              Discectomy with Fusion          Discectomy with Fusion          Discectomy with Fusion    Last Modified By:         Glendora, RN, Sharlet JINNY Glendora, RN, Sharlet JINNY Glendora, RNSharlet JINNY                              08/26/19 11:38:33                08/26/19 11:38:33               08/26/19 11:38:33                                Entry 7                         Entry 8                         Entry 9                                          Case Attendee             CASIMIR CARWIN  CON MIX N-MD             BERNARDINO IHA                                                                                              CLARIE-CRNA    Role Performed            Radiology Tech                  Surgeon Secondary               CRNA    Time In                   08/26/19 07:56:00               08/26/19 10:38:00               08/26/19 10:44:00    Time Out                  08/26/19 11:38:00               08/26/19 10:47:00               08/26/19 11:38:00    Procedure                 Anterior Cervical               Anterior Cervical               Anterior Cervical                              Discectomy with Fusion          Discectomy with Fusion          Discectomy with Fusion    Last Modified By:         Glendora, RN, Sharlet JINNY Glendora, RN, Sharlet JINNY Glendora, RNSharlet JINNY                              08/26/19 11:38:33               08/26/19 11:38:33               08/26/19 11:38:33                                Entry 10  Case Attendee             ALLANA IHA A-NP    Role Performed            Other Authorized                              Personnel    Time In                   08/26/19 07:56:00    Time Out                  08/26/19 11:38:00    Procedure                 Anterior Cervical                              Discectomy with Fusion    Last Modified By:         Glendora OBIE Sharlet JINNY                              08/26/19 11:38:33      S[facility] - Case Attendance Audit                                                                     08/26/19 11:38:33         Owner: REBA                               Modifier:  HASTPA                                                            1     <+> Time Out            1     <*> Procedure                              Anterior Cervical Discectomy with Fusion            2     <+> Time Out            2     <*> Procedure                              Anterior Cervical Discectomy with Fusion            3     <+> Time Out            3     <*> Procedure                              Anterior Cervical Discectomy  with Fusion            4     <+> Time Out            4     <*> Procedure                              Anterior Cervical Discectomy with Fusion            5     <+> Time Out            5     <*> Procedure                              Anterior Cervical Discectomy with Fusion            6     <+> Time Out            6     <*> Procedure                              Anterior Cervical Discectomy with Fusion            7     <+> Time Out            7     <*> Procedure                              Anterior Cervical Discectomy with Fusion            8     <*> Procedure                              Anterior Cervical Discectomy with Fusion            9     <+> Time Out            9     <*> Procedure                              Anterior Cervical Discectomy with Fusion            10    <+> Time Out            10    <*> Procedure                              Anterior Cervical Discectomy with Fusion     08/26/19 10:50:15         Owner: HASTPA                               Modifier: HASTPA                                                            1     <*> Procedure  Anterior Cervical Discectomy with Fusion            2     <*> Procedure                              Anterior Cervical Discectomy with Fusion            3     <*> Procedure                              Anterior Cervical Discectomy with Fusion            4     <*> Procedure                              Anterior Cervical Discectomy with Fusion            5     <*> Procedure                              Anterior  Cervical Discectomy with Fusion            6     <*> Procedure                              Anterior Cervical Discectomy with Fusion            7     <*> Procedure                              Anterior Cervical Discectomy with Fusion            8     <*> Procedure                              Anterior Cervical Discectomy with Fusion            9     <*> Procedure                              Anterior Cervical Discectomy with Fusion            10    <+> Time In            10    <*> Procedure                              Anterior Cervical Discectomy with Fusion     08/26/19 10:50:12         Owner: HASTPA                               Modifier: HASTPA                                                            1     <*>  Procedure                              Anterior Cervical Discectomy with Fusion            2     <+> Time In            2     <*> Procedure                              Anterior Cervical Discectomy with Fusion            3     <+> Time In            3     <*> Procedure                              Anterior Cervical Discectomy with Fusion            4     <+> Time In            4     <*> Procedure                              Anterior Cervical Discectomy with Fusion            5     <+> Time In            5     <*> Procedure                              Anterior Cervical Discectomy with Fusion            6     <+> Time In            6     <*> Procedure                              Anterior Cervical Discectomy with Fusion            7     <+> Time In            7     <*> Procedure                              Anterior Cervical Discectomy with Fusion        <+> 8         Case Attendee        <+> 8         Role Performed        <+> 8         Time In        <+> 8         Time Out        <+> 8         Procedure        <+> 9         Case Attendee        <+> 9         Role Performed        <+>  9         Time In        <+> 9         Procedure        <+> 10        Case Attendee        <+> 10        Role  Performed        <+> 10        Procedure     08/26/19 08:47:12         Owner: HASTPA                               Modifier: HASTPA                                                            1     <+> Time In            1     <*> Procedure                              Anterior Cervical Discectomy with Fusion        <+> 2         Case Attendee        <+> 2         Role Performed        <+> 2         Procedure        <+> 3         Case Attendee        <+> 3         Role Performed        <+> 3         Procedure        <+> 4         Case Attendee        <+> 4         Role Performed        <+> 4         Procedure        <+> 5         Case Attendee        <+> 5         Role Performed        <+> 5         Procedure        <+> 6         Case Attendee        <+> 6         Role Performed        <+> 6         Procedure        <+> 7         Case Attendee        <+> 7         Role Performed        <+> 7         Procedure        SFOR - Skin Assessment  Pre-Care Text:            A.240 Assesses baseline skin condition  Im.120 Implements protective measures to prevent skin or tissue injury           due to mechanical sources   Im.280.1 Implements progective measures to prevent skin or tissue injury due to           thermal sources  Im.360 Monitors for signs and symptons of infection                              Entry 1                                                                                                          Skin Integrity            Intact    Last Modified By:         Glendora, RN, Sharlet PARAS                              08/26/19 08:51:29    Post-Care Text:            E.10 Evaluates for signs and symptoms of physical injury to skin and tissue  E.270 Evaluate tissue perfusion           O.60 Patient is free from signs and symptoms of injury caused by extraneous objects    O.210 Patinet's tissue           perfusion is consistent with or improved from  baseline levels      SFOR - Patient Positioning                                                                      Pre-Care Text:            A.240 Assesses baseline skin condition  A.280 Identifies baseline musculoskeletal status  A.280.1 Identifies           physical alterations that require additional precautions for procedure-specific positioning  A.510.8 Maintains           patient's dignity and privacy  Im.120 Implements protective measures to prevent skin/tissue injury due to           mechanical sources  Im.40 Positions the patient  Im.80 Applies safety devices                              Entry 1  Procedure                 Anterior Cervical               Body Position                   Supine                              Discectomy with Fusion    Left Arm Position         Tucked and Padded at            Right Arm Position              Tucked and Padded at                              Side                                                            Side    Left Leg Position         Extended Security               Right Leg Position              Extended Security                              Strap, Pillow Under Knee                                        Strap, Pillow Under Knee    Feet Uncrossed            Yes                             Pressure Points                 Yes                                                              Checked     Positioning Device        Head Rest Gel, Roll             Positioned By                   Glendora, RN, Sharlet PARAS,                              Shoulder, Heel pad  KOVACH,  JANINE M-CRNA,                                                                                              Harrison,                                                                                              CURTIS-MD, Diaz,                                                                                               ROY    Outcome Met (O.80)        Yes    Last Modified By:         Glendora, RN, Sharlet PARAS                              08/26/19 08:51:00    Post-Care Text:            E.10 Evaluates for signs and symptoms of physical injury to skin and tissue  E.290 Evaluates musculoskeletal           status  O.80 Patient is free from signs and symptoms of injury related to positioning  O.120 the patient is           free from signs and symptoms of injury related to transfer/transport   O.250 Patient's musculoskeletal status           is maintained at or improved from baseline levels      SFOR - Skin Prep                                                                                Pre-Care Text:            A.30 Verifies allergies  A.20 Verifies procedure, surgical site, and laterality  A.510.8 Maintains paritnet's           dignity and privacy  Im.270 Performs Skin Preparation  Im.270.1 Implements protective measures to prevent skin  and tissue injury due to chemical sources   A.300.1 Protects from cross-contamination                              Entry 1                                                                                                          Hair Removal     Skin Prep      Prep Agents (Im.270)     Povidone Iodine Scrub           Prep Area (Im.270)              Neck, Chest                              7.5%, Povidone Iodine                              Solution 10%,                              Chlorhexidine Gluconate                              2% w/Alcohol      Prep By                  Glendora, RN, Sharlet PARAS    Outcome Met (O.100)       Yes    Last Modified By:         Glendora, RN, Sharlet PARAS                              08/26/19 08:51:51    Post-Care Text:            E.10 Evaluates for signs and symptoms of physical injury to skin and tissue  O.100 Patient is free from signs           and symptoms of chemical injury   O.740 The  patient's right to privacy is maintained      SFOR - Counts Initial and Final                                                                 Pre-Care Text:            A.20 Verifies operative procedure, sugical site, and laterality  A.20.2 Assesses the risk for unintended           retained foreign body  Im.20 Performs required counts  Entry 1                                                                                                          Initial Counts      Initial Counts           Glendora, RN, Sharlet PARAS,         Items included in               Sponges, Sharps     Performed By             Lennie Irving PARAS                the Initial Count     Final Counts      Final Counts             Glendora, RN, Sharlet PARAS,         Final Count Status              Correct     Performed By             Lennie Irving PARAS     Items Included in        Sponges, Sharps     Final Count     Outcome Met (O.20)        Yes    Last Modified By:         Glendora, RNSharlet PARAS                              08/26/19 08:48:32    Post-Care Text:            E.50 Evaluates results of the surgical count  O.20 Patient is free from unintended retained foreign objects      SFOR - General Case Data                                                                        Pre-Care Text:            A.350.1 Classifies surgical wound                              Entry 1  Case Information      ASA Class                2                               Case Level                      Level 4     OR                       SF 03                           Specialty                       Neurosurgery (SN)     Wound Class              1-Clean    Preop Diagnosis           CERVICAL MYELOPATHY             Postop Diagnosis                CERVICAL MYELOPATHY    Last Modified By:         Glendora, RNSharlet PARAS                               08/26/19 08:47:21    Post-Care Text:            O.760 Patient receives consistent and comparable care regardless of the setting      SFOR - Fire Risk Assessment                                                                                               Entry 1                                                                                                          Fire Risk                 Alcohol  Based Prep              Fire Risk Score                 3    Assessment: If            Solution, Surgical Site    checked, checkmark  Above Xiphoid, Ignition    = 1 point                 Source In Use    Last Modified By:         Glendora, RN, Sharlet PARAS                              08/26/19 08:48:58      SFOR - Time Out                                                                                 Pre-Care Text:            A.10 Confirms patient identity  A.20 Verifies operative procedure, surgical site, and laterality  A.20.1           Verifies consent for planned procedure  A.30 Verifies allergies                              Entry 1                                                                                                          Procedure                 Anterior Cervical               Is everyone ready               Yes                              Discectomy with Fusion          to perform time out     Have all members of       Yes                             Patient name and                Yes    the surgical team                                         DOB confirmed     been introduced     Allergies discussed       Yes  Surgical procedure              Yes                                                              to be performed                                                               confirmed and                                                               verified by                                                               completed surgical                                                                consent     Correct surgical          Yes                             Correct laterality              Yes    site marked and                                           confirmed     initials are     visible through     prepped and draped     field (or     alternative ID band     used)     Correct patient           Yes                             Surgeon shares                  Yes    position confirmed                                        operative plan,  possible                                                               difficulties,                                                               expected duration,                                                               anticipated blood                                                               loss and reviews                                                               all                                                               critical/specific                                                               concerns     Required blood            Yes                             Essential imaging               Yes    products, implants,                                       available and fetal     devices and/or  heartones confirmed     special equipment                                         (if applicable)     available and     sterility confirmed     VTE prophylaxis           Yes                             Antibiotics ordered             Yes    addressed                                                 and administered     Anesthesia shares         Yes                             Fire risk                       Yes    anesthetic plan and                                       assessment scored     reviews patient                                           and plan discussed     specific concerns      Appropriate drying        Yes                             Surgeon states:                 Yes    time for prep                                             Does anyone have     observed before                                           any concerns? If     draping                                                   you see, suspect,  or feel that                                                               patient care is                                                               being compromised,                                                               speak up for                                                               patient safety     Time Out Complete         08/26/19 08:35:00    Last Modified By:         Glendora RNSharlet PARAS                              08/26/19 08:51:21    Post-Care Text:            E.30 Evaluates verification process for correct patient, site, side, and level surgery      SFOR - Debrief                                                                                  Pre-Care Text:            Im.330 Manages specimen handling and disposition                              Entry 1                                                                                                          Procedure  Anterior Cervical               Final counts                    Yes                              Discectomy with Fusion          correct and                                                               verbally verified                                                               with                                                               surgeon/licensed                                                               independent                                                               practitioner (if                                                               applicable)     Actual procedure           Yes                             Postop diagnosis                Yes    performed confirmed                                       confirmed     Wound                     Yes  Confirm specimens               Yes    classification                                            and specimens     confirmed                                                 labeled                                                               appropriately (if                                                               applicable)     Foley catheter            Yes                             Patient recovery                Yes    removed (if                                               plan confirmed     applicable)     Debrief Complete          08/26/19 11:23:00    Last Modified By:         Glendora, RN, Sharlet PARAS                              08/26/19 11:23:15    Post-Care Text:            E.800 Ensures continuity of care  E.50 Evaluates results of the surgical count  O.30 Patient's procedure is           performed on the correct site, side, and level  O.50 patient's current status is communicated throughout the           continuum of care  O.40 Patient's specimen(s) is managed in the appropriate manner      SFOR - Cautery  Pre-Care Text:            A.240 Assesses baseline skin condition  A280.1 Identifies baseline musculoskeletal status  Im.50 Implements           protective measures to prevent injury due to electrical sources   Im.60 Uses supplies and equipment within safe           parameters  Im.80 Applies safety devices                              Entry 1                         Entry 2                                                                          ESU Type                  GENERATOR                       GENERATOR CODMAN BIPOLAR                              [facility]    Identification            [id number]                            [id number]    Number     Coag Setting (watts)      25    Cut Setting (watts)       0    Bipolar Setting                                           35    (watts)     Blend/Effect     Setting (watts)     Argon Plasma     Coagulator Mode     Flow Rate/Power     Level     Endocut     Endocut Energy     (watts)     Grounding Pad             Yes                             No    Needed?     Grounding Pad Site        Thigh, right    Grounding [employer], [employer]ity fund[employer], [employer] By     [employer]     Outcome Met (O.10)        Yes                             Yes    Last  Modified By:         Glendora, RN, Sharlet JINNY Glendora, RNSharlet JINNY                              08/26/19 08:48:06               08/26/19 08:48:06    Post-Care Text:            E.10 Evaluates for signs and symptoms of physical injury to skin and tissue  O.10 Patient is free from signs           and symptoms of injury related to thermal sources   O.70 Patient is free from signs and symptoms of electrical           injury      SFOR - Patient Care Devices                                                                     Pre-Care Text:            A.200 Assesses risk for normothermia regulation  A.40 Verifies presence of prosthetics or corrective devices           Im.280 Implements thermoregulation measures  Im.60 Uses supplies and equipment within safe parameters                              Entry 1                         Entry 2                                                                          Equipment Type            MACHINE SEQUENTIAL              BAIR HUGGER                              COMPRESSION    SCD Sleeve Site           Legs Bilateral    Equipment/Tag Number      [id number]                           [id number]    Initiated Pre             Yes    Induction     Last Modified By:         Glendora, RN, Sharlet JINNY Glendora, RN, Sharlet JINNY  08/26/19 08:50:38               08/26/19 08:50:38     Post-Care Text:            E.10 Evaluates signs and symptoms of physical injury to skin and tissue  O.60 Patient is free from signs and           symptoms of injury caused by extraneous objects      SFOR - Medications                                                                              Pre-Care Text:            A.10 Confirms patient identity  A.30 Verifies allergies  Im.220 Administers prescribed medications  Im.220.2           Administers prescribed antibiotic therapy as ordered                              Entry 1                         Entry 2                         Entry 3                                          Time Administered         08/26/19 08:49:00               08/26/19 08:49:00               08/26/19 08:49:00    Medication                BACITRACIN IRRIGATION           SURGIFOAM 8CM X 12.5CM          BUPIVACAINE HCL 0.25%                              500CC                           X 10CM (ETHICON 1974)           MPF 0.25%/10ML INJ    Route of Admin            Irrigation                      Topical                         Local Injection    Dose/Volume               1000 ML                         1  EACH                          2.5 ML    (include amount and     unit of measure)     Site                      Neck    Site Detail     Administered By           VIKI VICTORIO VIKI,  CURTIS-MD         WORTH[place],  CURTIS-MD    Outcome Met (O.130)       Yes                             Yes                             Yes    Last Modified By:         Glendora, RN, Sharlet JINNY Glendora, RN, Sharlet JINNY Glendora, RNSharlet JINNY                              08/26/19 08:50:15               08/26/19 11:29:56               08/26/19 08:50:15                                Entry 4                                                                                                          Time Administered         08/26/19 08:49:00    Medication                LIDOCAINE  1%  W/EPI                              VIAL    Route of Admin            Local Injection    Dose/Volume               2.5 ML    (include amount and     unit of measure)     Site     Site Detail     Administered By           VIKI,  CURTIS-MD    Outcome Met (O.130)       Yes    Last Modified By:  Glendora, RNSharlet PARAS                              08/26/19 11:30:20    Post-Care Text:            E.20 Evaluates response to medications  O.130 Patient receives appropriately administerd medication(s)      SFOR - Medications Audit                                                                         08/26/19 11:30:20         Owner: HASTPA                               Modifier: HASTPA                                                            4     <*> Medication                             LIDOCAINE  1% W/EPI VIAL            4     <+> Route of Admin     08/26/19 11:29:56         Owner: HASTPA                               Modifier: HASTPA                                                            2     <*> Medication                             SURGIFOAM 8CM X 12.5CM X 10CM (ETHICON 1974)            2     <+> Outcome Met (O.130)        SFOR - Implants/Endoscopy Stents                                                                Pre-Care Text:            A.20 Verifies operative procedure, surgical site, and laterality  A.20.1 Verifies consent for planned procedure            Im.350 Records implants inserted during the operative or invasive procedure  Entry 1                         Entry 2                         Entry 3                                          Implant/Explant           Implant                         Implant                         Implant    Implant     Identification      Catalog #               [id number]                         [id number]     Description              DURA SEALANT SYSTEM             GRAFT DURAL MATRIX               PLATE CERVICAL PEEK                              DURASEAL LOW SWELL          DURAGEN PLUS 2IN X 2IN          PLANAR OPEL-C 1                                                                                              LVL X X 7                                                                                              DEG     Expiration Date          04/08/20                        01/06/21     Lot Number  [id number]                        [id number]     Unique ID Number      Manufacturer             [employer]            [employer]     Serial Number     Usage Data      Implant Site             NECK                            NECK                            NECK     Quantity                 1                               1                               1     Bone and Tissue      Reconstituted            N/A                             N/A                             N/A     Solution Used      Lot Number      Other Solution     Last Modified By:         Glendora, RN, Sharlet JINNY Glendora, RN, Sharlet JINNY Glendora, RNSharlet JINNY                              08/26/19 11:01:28               08/26/19 11:01:28               08/26/19 11:01:28                                Entry 4  Implant/Explant           Implant    Implant     Identification      Catalog #               [id number]     Description              SCREW CERVICAL 4.1MM X                              VARIABLE S/T                              [id number]     Expiration Date      Lot Number      Unique ID Number      Manufacturer             [facility] SPINE     Serial Number     Usage Data      Implant Site             NECK     Quantity                 4    Bone and Tissue      Reconstituted            N/A     Solution Used      Lot Number      Other  Solution     Last Modified By:         Glendora OBIE Sharlet JINNY                              08/26/19 11:02:00    Post-Care Text:            E.30 Evaluates verification process for correct patient, site, side and level surgery  O.30 Patient's procedure           is performed on the correct site, side, and level      SFOR - Implants/Endoscopy Stents Audit                                                           08/26/19 11:02:00         Owner: HASTPA                               Modifier: HASTPA                                                            4     <*> Description                            SCREW CERVICAL 4.1MM X VARIABLE S/T 2-57858-85            4     <*> Quantity  1     08/26/19 11:01:28         Owner: HASTPA                               Modifier: HASTPA                                                            1     <*> Description                            DURA SEALANT SYSTEM DURASEAL LOW SWELL            1     <*> Lot Number                             [id number]            1     <*> Manufacturer                           [employer]            1     <*> Expiration Date                        04/08/20            1     <*> Implant Site                           NECK            1     <*> Quantity                               1            1     <*> Catalog #                             [id number]            1     <*> Implant/Explant                        Implant            1     <*> Reconstituted                          N/A            2     <+> Description            2     <+> Lot Number            2     <+> Manufacturer            2     <+> Expiration Date            2     <+>  Implant Site            2     <+> Quantity            2     <+> Catalog #            2     <*> Implant/Explant                        Implant            2     <+> Reconstituted            3     <*> Description                            GRAFT DURAL MATRIX DURAGEN PLUS 2IN X 2IN            3      <-> Lot Number                             [id number]            3     <*> Manufacturer                           [employer]            3     <*> Implant Site                           NECK            3     <*> Quantity                               1            3     <*> Catalog #                             [id number]            3     <*> Implant/Explant                        Implant            3     <*> Reconstituted                          N/A            4     <*> Description                            PLATE CERVICAL PEEK PLANAR OPEL-C 1 LVL X X 7                                                             DEG  4     <*> Manufacturer                           [employer]            4     <*> Implant Site                           NECK            4     <*> Quantity                               1            4     <*> Catalog #                             [id number]            4     <*> Implant/Explant                        Implant            4     <+> Reconstituted     08/26/19 10:53:04         Owner: HASTPA                               Modifier: HASTPA                                                        <+> 4         Description        <+> 4         Manufacturer        <+> 4         Implant Site        <+> 4         Quantity        <+> 4         Catalog #        <+> 4         Implant/Explant     08/26/19 10:34:25         Owner: HASTPA                               Modifier: HASTPA                                                        <+> 3         Description        <+> 3         Lot Number        <+> 3         Manufacturer        <+>  3         Implant Site        <+> 3         Quantity        <+> 3         Catalog #        <+> 3         Implant/Explant        <+> 3         Reconstituted        SFOR - Communication                                                                            Pre-Care Text:            A.520 Identifies barriers to  communication (Patient and Family Communications)  A.20 Verifies operative           procedure, surgical site, and laterality (Hand-off Communications)  Im.500 Provides status reports to family           members  Im.150 Develops individualized plan of care                              Entry 1                         Entry 2                                                                          Communication             Phone Call                      Phone Call    Communication By          Glendora, RN, Sharlet JINNY Glendora, RN, Sharlet JINNY    Date and Time             08/26/19 09:49:00               08/26/19 11:29:00    Communication To          ALISA - CINDIE ALISA - WIFE    Last Modified By:         Glendora, RN, Sharlet JINNY Glendora, RNSharlet JINNY                              08/26/19 10:29:01               08/26/19 11:29:41    Post-Care Text:  E.520 Evaluates psychosocial response to plan of care  O.500 Patient or designated support person demonstrates           knowledge of the expected psychosocial responses to the procedure  E.800 Ensures continuity of care  O.50           Patient's current status is communicated throughout the continuum of care      SFOR - Communication Audit                                                                       08/26/19 11:29:41         Owner: HASTPA                               Modifier: HASTPA                                                        <+> 2         Communication        <+> 2         Communication By        <+> 2         Date and Time        <+> 2         Communication To        [facility] - Spinal Fusion Level                                                                                                Entry 1                                                                                                          Actual Spinal             C5-C6                           # of Spinal Levels             Cervical 1 Level     Levels Fused  Fused     Last Modified By:         Glendora, RN, Sharlet PARAS                              08/26/19 08:52:06      SFOR - Dressing/Packing                                                                         Pre-Care Text:            A.350 Assesses susceptibility for infection  Im.250 Administers care to invasive devices  Im.290 Administer           care to wound sites   Im.300 Implements aseptic technique                              Entry 1                                                                                                          Site                      Neck    Dressing Item     Details      Dressing Item            Wound Closure Strip     (Im.290)     Last Modified By:         Glendora, RNSharlet PARAS                              08/26/19 11:35:36    Post-Care Text:            E.320 Evaluate factors associted with increased risk for postoperative infection at the completion of the           procedure  O.200 Patient's wound perfusion is consistent with or improved from baseline levels   O.Patient is           free from signs and symptoms of infection      SFOR - Dressing/Packing Audit                                                                    08/26/19 11:35:36         Owner: REBA  Modifier: HASTPA                                                            1     <*> Dressing Item (Im.290)                 Liquid Bandage     08/26/19 09:49:42         Owner: HASTPA                               Modifier: HASTPA                                                            1     <*> Dressing Item (Im.290)                 Wound Closure Strip, 4x4's, Occlusive Dressing        SFOR - Procedures                                                                               Pre-Care Text:            A.20 Verifies operative procedure, surgical site, and laterality  Im.150 Develops individualized plan of care                               Entry 1                                                                                                          Procedure     Description      Procedure                Anterior Cervical               Surgical Procedure              C5/6 ACDF                              Discectomy with Fusion          Text  and/or Stabilization                              SCIP    Primary Procedure         Yes                             Primary Surgeon                 [place],  CURTIS-MD    Start                     08/26/19 08:40:00               Stop                            08/26/19 11:26:00    Anesthesia Type           General                         Surgical Service                Neurosurgery (SN)    Wound Class               1-Clean    Last Modified By:         Glendora RNSharlet PARAS                              08/26/19 11:27:37    Post-Care Text:            O.730 The patient's care is consistent with the individualized perioperative plan of care      [facility] - Transfer                                                                                                           Entry 1                                                                                                          Transferred By            Glendora, RN, Sharlet PARAS,         Via                             Stretcher  KOVACH,  JANINE M-CRNA    Post-op Destination       PACU    Skin Assessment      Condition                Intact    Last Modified By:         Glendora RNSharlet PARAS                              08/26/19 08:54:18      Case Comments                                                                                         <None>              Finalized By: WILLMA TAFFY LABOR      Document Signatures                                                                             Signed By:           Glendora OBIE Sharlet PARAS 08/26/19 11:38          LLOYD,  DELORIS A 08/30/19  21:29      Unfinalized History                                                                                     Date/Time            Username    Reason for Unfinalizing         Freetext Reason for Unfinalizing                                          08/30/19 21:23       LLOYDD      Charging

## 2019-08-30 NOTE — Progress Notes (Signed)
Interdisciplinary Team Conference - Text       Interdisciplinary Team Conference PF Entered On:  08/30/2019 16:46 EDT    Performed On:  08/30/2019 16:45 EDT by Duffy Rhody T               Team Members   Care Manager :   Duffy Rhody T   Nurse :   Emelda Fear RN, Milagros Reap   Occupational Therapist :   Marisa Sprinkles OT, MIRA E   Physical Therapist :   Ulice Dash, MEGAN A   Rehab Physician :   Alcide Clever M-MD   SPENCER, PT, MEGAN A - 08/31/2019 12:50 EDT   Primary Nurse :   Raul Del RN, Crystal N   Team Conference Date :   08/31/2019 EDT   Raul Del RN, Crystal N - 08/31/2019 11:06 EDT   Primary Care Manager :   Sudie Grumbling,  KATHRYN T - 08/30/2019 16:45 EDT   Care Management Summary.   Type of Conference :   Initial   Discharge Plan :   home w/spouse   Vincent Peyer - 08/30/2019 16:45 EDT   Nursing Summary   Bowel Management :   Continent   Bowel Movement Last Date :   08/29/2019 EDT   Bladder Management :   Continent   Urinary Elimination Management :   Voiding, no difficulties   Tracheostomy Information :   No qualifying data available.     Progress Achieved This Week :   A&Ox4, voiding freely, PVR's <300, requesting pain medication q4h since admission, incision with steri strips in place   Nursing Team Notes Current :   Yes   Raul Del, RN, Crystal N - 08/31/2019 11:06 EDT   OT Basic ADL   OT Basic ADL 2019   Eating :   Independent - Patient/Resident completes the activities by him/herself, with or without an assistive device, with no assistance from a helper. - 06   Oral Hygiene :   Setup or clean-up assistance - Helper sets up or cleans up; Patient/Resident completes activity. Helper assists only prior to or following the activity. - 05   Toileting Hygiene :   Supervision or touching assistance - Helper provides verbal cues and/or touching/steadying and/or contact guard assistance as patient/resident completes activity. Assistance may be provided throughout the activity or intermittently. - 04    Toilet Transfer :   Supervision or touching assistance - Helper provides verbal cues and/or touching/steadying and/or contact guard assistance as patient/resident completes activity. Assistance may be provided throughout the activity or intermittently. - 04   Shower, Bathe Self :   Supervision or touching assistance - Helper provides verbal cues and/or touching/steadying and/or contact guard assistance as patient/resident completes activity. Assistance may be provided throughout the activity or intermittently. - 04   Upper Body Dressing :   Setup or clean-up assistance - Helper sets up or cleans up; Patient/Resident completes activity. Helper assists only prior to or following the activity. - 05   Lower Body Dressing :   Supervision or touching assistance - Helper provides verbal cues and/or touching/steadying and/or contact guard assistance as patient/resident completes activity. Assistance may be provided throughout the activity or intermittently. - 04   Putting On, Taking Off Footwear :   Supervision or touching assistance - Helper provides verbal cues and/or touching/steadying and/or contact guard assistance as patient/resident completes activity. Assistance may be provided throughout the activity or intermittently. - 04   Grooming :  Setup or clean-up assistance - Helper sets up or cleans up; Patient/Resident completes activity. Helper assists only prior to or following the activity. - 05   Tub Transfer :   Not attempted due to environmental limitations - 10   Shower Transfer :   Not attempted due to environmental limitations - 10   KRAFT, OT, MIRA E - 08/31/2019 12:05 EDT   OT ADL Reviewed :   Yes   KRAFT, OT, MIRA E - 08/31/2019 12:05 EDT   Cognition   Attention Cognition Grid   Sustained :   Impaired   Alternating :   Impaired   Divided :   Impaired   Attention to Detail :   Impaired   KRAFT, OT, MIRA E - 08/31/2019 12:05 EDT   Cognition Reviewed :   Yes   KRAFT, OT, MIRA E - 08/31/2019 12:05 EDT   OT Short  Term Goals.   OT STG Reviewed :   Yes   KRAFT, OT, MIRA E - 08/31/2019 12:05 EDT   OT Long Term Goals   OT Inpatient Long Term Goals Team 2019   Eating Goal :   Independent - Patient/Resident completes the activities by him/herself, with or without an assistive device, with no assistance from a helper. - 06   Oral Hygiene Goal :   Independent - Patient/Resident completes the activities by him/herself, with or without an assistive device, with no assistance from a helper. - 06   Toileting Hygiene Goal :   Independent - Patient/Resident completes the activities by him/herself, with or without an assistive device, with no assistance from a helper. - 06   Shower, Bathe Self Goal :   Independent - Patient/Resident completes the activities by him/herself, with or without an assistive device, with no assistance from a helper. - 06   Upper Body Dressing Goal :   Independent - Patient/Resident completes the activities by him/herself, with or without an assistive device, with no assistance from a helper. - 06   Lower Body Dressing Goal :   Independent - Patient/Resident completes the activities by him/herself, with or without an assistive device, with no assistance from a helper. - 06   Toilet Transfer Goal :   Independent - Patient/Resident completes the activities by him/herself, with or without an assistive device, with no assistance from a helper. - 06   KRAFT, OT, MIRA E - 08/31/2019 12:05 EDT   OT Long Term Goals Reviewed :   Yes   KRAFT, OT, MIRA E - 08/31/2019 12:05 EDT   Occupational Therapy Summary.   Additional Comments DME OT :   5 days   OT Team Notes Current :   Yes   KRAFT, OT, MIRA E - 08/31/2019 12:05 EDT   PT Mobility   PT Mobility Reviewed :   Molly Maduro, PT, Amber - 08/31/2019 12:24 EDT   WC Management   PT Wheelchair Mobilitiy Reviewed Rehab :   Molly Maduro, PT, Amber - 08/31/2019 12:24 EDT   PT Short Term Goals.   PT STG Reviewed :   Molly Maduro, PT, Amber - 08/31/2019 12:24 EDT   PT Long Term Goals   PT  LT Goals Reviewed :   Yes   Hegeman, PT, Amber - 08/31/2019 12:24 EDT   Physical Therapy Summary.   Additional Comments DME PT :   PT eval scheduled for PM   PT Team Notes Current :   Yes   Hegeman, PT, Amber -  08/31/2019 12:24 EDT   SLP Short Term Goals.   SLP STG Reviewed :   Yes   SPENCER, PT, MEGAN A - 08/31/2019 12:50 EDT   Speech Therapy Summary.   SLP Progress Note Current :   N/A   Frederico Hamman, PT, MEGAN A - 08/31/2019 12:50 EDT   Psychology Summary.   Psychology Progress Notes Current :   N/A   Frederico Hamman, PT, MEGAN A - 08/31/2019 12:50 EDT   Interdisciplinary Discharge Planning.   Medical/rehab reason for continued stay :   Community mobility issue, Home mobility issue, Medication adjustments, Pain management, Spasticity   Raynelle Chary M-MD - 08/31/2019 13:03 EDT   Discharge Disposition Plan :   Home w/ wife   Rehab Anticipated Discharge Date :   09/06/2019 EDT   Next Level of Care :   Home Health, Outpatient   Team Conference Discussion RTF :   Merilyn Baba, PT, served as scribe during team conference on this date.      Barriers to goals/Reasons for skilled intervention :   Decreased endurance, Decreased sitting/standing balance, Decreased strength/ROM, Pain management issues, Safety issues   SPENCER, PT, MEGAN A - 08/31/2019 12:50 EDT   Anticipated Therapy Interventions.   PT Estimated Hours per Week :   7.5 hours per week   Therapy Activity Tolerance :   3 hours over 5 days   OT Estimated Hours Per Week :   7.5 hours per week   SPENCER, PT, MEGAN A - 08/31/2019 12:50 EDT   Rehab Team Goals   Team Mobility Goals Grid     Goal #1          Team Mobility Goals :    Toilet transfers              Team Mobility Assist Level :    Independent              Team Mobility Goal Status :    Initial                SPENCER, PT, MEGAN A - 08/31/2019 12:50 EDT         Team Pain Management Goals Grid     Goal #1          Team Pain Management Goals :    Acute pain control, Chronic pain control, Knowledge of meds, Knowledge of  non-pharmacological options              Team Pain Management Goals Status :    Initial                SPENCER, PT, MEGAN A - 08/31/2019 12:50 EDT

## 2019-08-30 NOTE — Nursing Note (Signed)
Adult Patient History Form-Text       Adult Patient History Entered On:  08/30/2019 14:59 EDT    Performed On:  08/30/2019 14:57 EDT by Gladis Riffle, RN, Alisa A               General Info   Patient Identified :   Identification band, Verbal   Patient Identified :   Jesse Savage   Information Given By :   Self   Preferred Mode of Communication :   Verbal, Written   Accompanied By :   None   In Charge of News ([facility]) Name :   wife   Pregnancy Status :   N/A   Has the patient received chemotherapy or immunotherapy (cytotoxic)  in the last 48-72 hours? :   No   In Clinical Trial With Signed Consent for Related Condition :   No signed consent for clinical trial   Is the patient currently (2-3 days) receiving radiation treatment? :   No   Gladis Riffle, RN, Alisa A - 08/30/2019 14:57 EDT   Allergies   (As Of: 08/30/2019 14:59:38 EDT)   Allergies (Active)   codeine  Estimated Onset Date:   Unspecified ; Reactions:   NA ; Created By:   Haynes Hoehn D; Reaction Status:   Active ; Category:   Drug ; Substance:   codeine ; Type:   Allergy ; Severity:   Unknown ; Updated By:   Haynes Hoehn D; Reviewed Date:   08/30/2019 14:57 EDT      Cymbalta  Estimated Onset Date:   Unspecified ; Reactions:   NA ; Created By:   Haynes Hoehn D; Reaction Status:   Active ; Category:   Drug ; Substance:   Cymbalta ; Type:   Allergy ; Severity:   Unknown ; Updated By:   Haynes Hoehn D; Reviewed Date:   08/30/2019 14:57 EDT      Neurontin  Estimated Onset Date:   Unspecified ; Reactions:   NA ; Created By:   Haynes Hoehn D; Reaction Status:   Active ; Category:   Drug ; Substance:   Neurontin ; Type:   Allergy ; Severity:   Unknown ; Updated By:   Haynes Hoehn D; Reviewed Date:   08/30/2019 14:57 EDT        Medication History   Medication List   (As Of: 08/30/2019 14:59:38 EDT)   Normal Order    dexAMETHasone 4 mg Tab  :   dexAMETHasone 4 mg Tab ; Status:   Ordered ; Ordered As Mnemonic:   Decadron ; Simple Display Line:   4 mg, 1 tabs, Oral, WB  ; Ordering Provider:   Anselm Jungling P-FNP; Catalog Code:   dexAMETHasone ; Order Dt/Tm:   08/30/2019 11:18:18 EDT          sodium chloride 0.9% Inj Soln 10 mL syringe  :   sodium chloride 0.9% Inj Soln 10 mL syringe ; Status:   Ordered ; Ordered As Mnemonic:   sodium chloride 0.9% flush syringe range dose ; Simple Display Line:   30 mL, IV Push, q8hr ; Ordering Provider:   Alcide Clever M-MD; Catalog Code:   sodium chloride flush ; Order Dt/Tm:   08/30/2019 14:54:52 EDT          A Patient Specific Medication  :   A Patient Specific Medication ; Status:   Ordered ; Ordered As Mnemonic:   A Patient Specific Medication ;  Simple Display Line:   1 EA, Kit-Combo, q48min, PRN: other (see comment) ; Ordering Provider:   Alcide Clever M-MD; Catalog Code:   A Patient Specific Medication ; Order Dt/Tm:   08/30/2019 14:54:52 EDT          A Patient Specific Refrigerated Medication  :   A Patient Specific Refrigerated Medication ; Status:   Ordered ; Ordered As Mnemonic:   A Patient Specific Refrigerated Medication ; Simple Display Line:   1 EA, Kit-Combo, q9min, PRN: other (see comment) ; Ordering Provider:   Alcide Clever M-MD; Catalog Code:   A Patient Specific Refrigerated Medicati ; Order Dt/Tm:   08/30/2019 14:54:52 EDT ; Comment:   to access the patient specific Refrigerated medications          Delivery and Return [place] Access  :   Delivery and Return [place] Access ; Status:   Ordered ; Ordered As Mnemonic:   Delivery and Return Bin Access ; Simple Display Line:   1 EA, Kit-Combo, q96min, PRN: other (see comment) ; Ordering Provider:   Alcide Clever M-MD; Catalog Code:   Delivery and Return Bin Access ; Order Dt/Tm:   08/30/2019 14:54:52 EDT ; Comment:   This code grants access to the [employer] for the Delivery and Return Bin Access          lidocaine 1% PF Inj Soln 2 mL  :   lidocaine 1% PF Inj Soln 2 mL ; Status:   Ordered ; Ordered As Mnemonic:   lidocaine 1% preservative-free injectable solution ;  Simple Display Line:   0.25 mL, ID, q9min, PRN: other (see comment) ; Ordering Provider:   Alcide Clever M-MD; Catalog Code:   lidocaine ; Order Dt/Tm:   08/30/2019 14:54:52 EDT ; Comment:   to access lidocaine 1%  2 mL vial for IV start and Life Port access          lidocaine 2% Topical Gel with applicator 10-11 mL  :   lidocaine 2% Topical Gel with applicator 10-11 mL ; Status:   Ordered ; Ordered As Mnemonic:   Uro-Jet 2% topical gel with applicator ; Simple Display Line:   1 app, Topical, q35min, PRN: other (see comment) ; Ordering Provider:   Alcide Clever M-MD; Catalog Code:   lidocaine topical ; Order Dt/Tm:   08/30/2019 14:54:52 EDT          Respiratory MDI Treatment  :   Respiratory MDI Treatment ; Status:   Ordered ; Ordered As Mnemonic:   Respiratory MDI Treatment ; Simple Display Line:   1 EA, Kit-Combo, q27min, PRN: other (see comment) ; Ordering Provider:   Alcide Clever M-MD; Catalog Code:   Respiratory MDI Treatment ; Order Dt/Tm:   08/30/2019 14:54:52 EDT          sodium chloride 0.9% Inj Soln 10 mL syringe  :   sodium chloride 0.9% Inj Soln 10 mL syringe ; Status:   Ordered ; Ordered As Mnemonic:   sodium chloride 0.9% flush syringe range dose ; Simple Display Line:   30 mL, IV Push, q93min, PRN: other (see comment) ; Ordering Provider:   Alcide Clever M-MD; Catalog Code:   sodium chloride flush ; Order Dt/Tm:   08/30/2019 14:54:52 EDT          sodium chloride 0.9% Inj Soln 10 mL vial PF  :   sodium chloride 0.9% Inj Soln 10 mL vial PF ; Status:  Ordered ; Ordered As Mnemonic:   sodium chloride 0.9% vial for reconstitution range dose ; Simple Display Line:   30 mL, IV Push, q47min, PRN: other (see comment) ; Ordering Provider:   Alcide Clever M-MD; Catalog Code:   sodium chloride flush ; Order Dt/Tm:   08/30/2019 14:54:52 EDT ; Comment:   for access to sodium chloride 0.9% vial when needed as a diluent for reconstitutable medications          sterile water Inj Soln 10 mL  :   sterile water Inj  Soln 10 mL ; Status:   Ordered ; Ordered As Mnemonic:   sterile water for reconstitution ; Simple Display Line:   10 mL, N/A, q90min, PRN: other (see comment) ; Ordering Provider:   Alcide Clever M-MD; Catalog Code:   sterile water for reconstitution ; Order Dt/Tm:   08/30/2019 14:54:52 EDT ; Comment:   Access sterile water when needed as a diluent for reconstitutable medications. Not for IV use.          dexAMETHasone 4 mg Tab  :   dexAMETHasone 4 mg Tab ; Status:   Completed ; Ordered As Mnemonic:   Decadron ; Simple Display Line:   4 mg, 1 tabs, Oral, Once ; Ordering Provider:   Anselm Jungling P-FNP; Catalog Code:   dexAMETHasone ; Order Dt/Tm:   08/30/2019 11:21:15 EDT          LORazepam 1 mg Tab  :   LORazepam 1 mg Tab ; Status:   Ordered ; Ordered As Mnemonic:   Ativan ; Simple Display Line:   1 mg, 1 tabs, Oral, Once a Day (at bedtime), PRN: anxiety ; Ordering Provider:   Lanetta Inch; Catalog Code:   LORazepam ; Order Dt/Tm:   08/28/2019 14:05:23 EDT ; Comment:   " THIS MEDICATION IS ASSOCIATED   WITH   AN INCREASED RISK OF FALLS."          docusate-senna 50 mg-8.6 mg Tab  :   docusate-senna 50 mg-8.6 mg Tab ; Status:   Ordered ; Ordered As Mnemonic:   docusate-senna 50 mg-8.6 mg oral tablet ; Simple Display Line:   1 tabs, Oral, BID ; Ordering Provider:   Lanetta Inch; Catalog Code:   docusate-senna ; Order Dt/Tm:   08/26/2019 14:37:39 EDT ; Comment:   hold for loose stools          famotidine 10 mg/mL IV Soln 2 mL  :   famotidine 10 mg/mL IV Soln 2 mL ; Status:   Discontinued ; Ordered As Mnemonic:   Pepcid ; Simple Display Line:   20 mg, 2 mL, IV Push, q12hr ; Ordering Provider:   Anselm Jungling P-FNP; Catalog Code:   famotidine ; Order Dt/Tm:   08/26/2019 12:54:17 EDT          acetaminophen 500 mg Tab  :   acetaminophen 500 mg Tab ; Status:   Ordered ; Ordered As Mnemonic:   acetaminophen ; Simple Display Line:   500 mg, 1 tabs, Oral, q6hr ; Ordering Provider:   Lanetta Inch; Catalog Code:   acetaminophen ; Order Dt/Tm:   08/26/2019 14:37:39 EDT ; Comment:   Scheduled for multimodal pain control.  Do not exceed 4000mg  APAP in 24 hours.          acetaminophen-oxyCODONE 325 mg-5 mg Tab  :   acetaminophen-oxyCODONE 325 mg-5 mg Tab ; Status:   Ordered ;  Ordered As Mnemonic:   oxyCODONE-acetaminophen 5 mg-325 mg oral tablet range dose ; Simple Display Line:   2 tabs, Oral, q6hr, PRN: moderate pain (4-7) ; Ordering Provider:   Lanetta Inch; Catalog Code:   acetaminophen-oxyCODONE ; Order Dt/Tm:   08/26/2019 14:37:39 EDT ; Comment:   MAX DAILY DOSE OF ACETAMINOPHEN = 4000 MG          aluminum hydroxide/magnesium hydroxide/simethicone 200 mg-200 mg-20 mg/5 mL  :   aluminum hydroxide/magnesium hydroxide/simethicone 200 mg-200 mg-20 mg/5 mL ; Status:   Ordered ; Ordered As Mnemonic:   aluminum hydroxide/magnesium hydroxide/simethicone 200 mg-200 mg-20 mg/5 mL oral suspension ; Simple Display Line:   30 mL, Oral, q4hr, PRN: indigestion ; Ordering Provider:   Lanetta Inch; Catalog Code:   Al hydroxide/Mg hydroxide/simethicone ; Order Dt/Tm:   08/26/2019 14:37:39 EDT          bisacodyl 10 mg Rectal Supp  :   bisacodyl 10 mg Rectal Supp ; Status:   Ordered ; Ordered As Mnemonic:   bisacodyl ; Simple Display Line:   10 mg, 1 supp, PR, Daily, PRN: constipation ; Ordering Provider:   Lanetta Inch; Catalog Code:   bisacodyl ; Order Dt/Tm:   08/26/2019 14:37:39 EDT ; Comment:   unrelieved by docusate-senna          diphenhydrAMINE 25 mg Cap  :   diphenhydrAMINE 25 mg Cap ; Status:   Ordered ; Ordered As Mnemonic:   diphenhydrAMINE ; Simple Display Line:   25 mg, 1 caps, Oral, q6hr, PRN: itching/allergic reaction ; Ordering Provider:   Lanetta Inch; Catalog Code:   diphenhydrAMINE ; Order Dt/Tm:   08/26/2019 14:37:39 EDT          hydrALAZINE 20 mg/mL Inj Soln 1 mL  :   hydrALAZINE 20 mg/mL Inj Soln 1 mL ; Status:   Ordered ; Ordered As  Mnemonic:   hydrALAZINE ; Simple Display Line:   10 mg, 0.5 mL, IV, q6hr, PRN: hypertension ; Ordering Provider:   Lanetta Inch; Catalog Code:   hydrALAZINE ; Order Dt/Tm:   08/26/2019 14:37:39 EDT          ondansetron 2 mg/mL Inj Soln 2 mL  :   ondansetron 2 mg/mL Inj Soln 2 mL ; Status:   Ordered ; Ordered As Mnemonic:   ondansetron ; Simple Display Line:   4 mg, 2 mL, IV Push, q6hr, PRN: nausea/vomiting ; Ordering Provider:   Lanetta Inch; Catalog Code:   ondansetron ; Order Dt/Tm:   08/26/2019 14:37:39 EDT ; Comment:   use ondansetron before promethazine if both are ordered          ondansetron 4 mg Tab  :   ondansetron 4 mg Tab ; Status:   Ordered ; Ordered As Mnemonic:   ondansetron ; Simple Display Line:   4 mg, 1 tabs, Oral, q6hr, PRN: nausea/vomiting ; Ordering Provider:   Lanetta Inch; Catalog Code:   ondansetron ; Order Dt/Tm:   08/26/2019 14:37:39 EDT ; Comment:   use ondansetron before promethazine if both are ordered          phenol 1.4% Topical Spray 177 mL  :   phenol 1.4% Topical Spray 177 mL ; Status:   Ordered ; Ordered As Mnemonic:   phenol 1.4% topical spray ; Simple Display Line:   5 sprays, Oral, q2hr, PRN: sore throat ; Ordering Provider:   Rolley Sims,  MELISSA LEEANNE-NP; Catalog Code:   phenol topical ; Order Dt/Tm:   08/26/2019 14:37:39 EDT          polyethylene glycol 3350 Oral Powder for Recon  :   polyethylene glycol 3350 Oral Powder for Recon ; Status:   Ordered ; Ordered As Mnemonic:   MiraLax ; Simple Display Line:   17 g, 1 packets, Oral, Daily, PRN: constipation ; Ordering Provider:   Rolley Sims,  MELISSA LEEANNE-NP; Catalog Code:   polyethylene glycol 3350 ; Order Dt/Tm:   08/26/2019 14:37:39 EDT          promethazine 25 mg Rectal Supp  :   promethazine 25 mg Rectal Supp ; Status:   Ordered ; Ordered As Mnemonic:   promethazine range dose ; Simple Display Line:   12.5 mg, 0.5 supp, PR, q6hr, PRN: nausea/vomiting ; Ordering Provider:   Lanetta Inch; Catalog Code:   promethazine ; Order Dt/Tm:   08/26/2019 14:37:39 EDT ; Comment:   Use ondansetron before promethazine if both ordered. Lower doses of 6.25-12.5mg  are recommended for pts > 13 yo. " THIS MEDICATION IS ASSOCIATED   WITH   AN INCREASED RISK OF FALLS."          promethazine 25 mg Tab  :   promethazine 25 mg Tab ; Status:   Ordered ; Ordered As Mnemonic:   promethazine range dose ; Simple Display Line:   12.5 mg, 0.5 tabs, Oral, q6hr, PRN: nausea/vomiting ; Ordering Provider:   Lanetta Inch; Catalog Code:   promethazine ; Order Dt/Tm:   08/26/2019 14:37:39 EDT ; Comment:   Use ondansetron before promethazine if both ordered. Lower doses of 6.25-12.5mg  are recommended for pts > 50 yo. " THIS MEDICATION IS ASSOCIATED   WITH   AN INCREASED RISK OF FALLS."          promethazine 25 mg/mL Inj Soln 1 mL  :   promethazine 25 mg/mL Inj Soln 1 mL ; Status:   Ordered ; Ordered As Mnemonic:   promethazine range dose ; Simple Display Line:   12.5 mg, 0.5 mL, IM, q6hr, PRN: nausea/vomiting ; Ordering Provider:   Lanetta Inch; Catalog Code:   promethazine ; Order Dt/Tm:   08/26/2019 14:37:39 EDT ; Comment:   Use ondansetron before promethazine if both ordered. Lower doses of 6.25-12.5mg  are recommended for pts > 56 yo. " THIS MEDICATION IS ASSOCIATED   WITH   AN INCREASED RISK OF FALLS."          simethicone 125 mg Chew Tab  :   simethicone 125 mg Chew Tab ; Status:   Ordered ; Ordered As Mnemonic:   simethicone ; Simple Display Line:   125 mg, 1 tabs, Oral, QID, PRN: gas ; Ordering Provider:   Lanetta Inch; Catalog Code:   simethicone ; Order Dt/Tm:   08/26/2019 14:37:39 EDT ; Comment:   max dose 500 mg in 24 hours          tiZANidine 4 mg Tab  :   tiZANidine 4 mg Tab ; Status:   Ordered ; Ordered As Mnemonic:   tiZANidine ; Simple Display Line:   4 mg, 1 tabs, Oral, TID, PRN: spasm ; Ordering Provider:   Lanetta Inch; Catalog Code:    tiZANidine ; Order Dt/Tm:   08/26/2019 14:37:39 EDT          sodium chloride 0.9% Inj Soln 10 mL syringe  :   sodium  chloride 0.9% Inj Soln 10 mL syringe ; Status:   Ordered ; Ordered As Mnemonic:   sodium chloride 0.9% flush syringe range dose ; Simple Display Line:   30 mL, IV Push, q8hr ; Ordering Provider:   Alcide Goodness,  CURTIS-MD; Catalog Code:   sodium chloride flush ; Order Dt/Tm:   08/26/2019 06:23:36 EDT          dexAMETHasone 4 mg/mL Inj Soln 1 mL  :   dexAMETHasone 4 mg/mL Inj Soln 1 mL ; Status:   Ordered ; Ordered As Mnemonic:   Decadron ; Simple Display Line:   4 mg, 1 mL, IV Push, qAM ; Ordering Provider:   Anselm Jungling P-FNP; Catalog Code:   dexAMETHasone ; Order Dt/Tm:   08/26/2019 11:55:06 EDT          A Patient Specific Medication  :   A Patient Specific Medication ; Status:   Ordered ; Ordered As Mnemonic:   A Patient Specific Medication ; Simple Display Line:   1 EA, Kit-Combo, q35min, PRN: other (see comment) ; Ordering Provider:   Alcide Goodness,  CURTIS-MD; Catalog Code:   A Patient Specific Medication ; Order Dt/Tm:   08/26/2019 06:23:36 EDT          A Patient Specific Refrigerated Medication  :   A Patient Specific Refrigerated Medication ; Status:   Ordered ; Ordered As Mnemonic:   A Patient Specific Refrigerated Medication ; Simple Display Line:   1 EA, Kit-Combo, q68min, PRN: other (see comment) ; Ordering Provider:   Alcide Goodness,  CURTIS-MD; Catalog Code:   A Patient Specific Refrigerated Medicati ; Order Dt/Tm:   08/26/2019 06:23:36 EDT ; Comment:   to access the patient specific Refrigerated medications          Delivery and Return [place] Access  :   Delivery and Return [place] Access ; Status:   Ordered ; Ordered As Mnemonic:   Delivery and Return Bin Access ; Simple Display Line:   1 EA, Kit-Combo, q30min, PRN: other (see comment) ; Ordering Provider:   Marzella Schlein; Catalog Code:   Delivery and Return Bin Access ; Order Dt/Tm:   08/26/2019 06:23:36 EDT ; Comment:   This code grants  access to the [employer] for the Delivery and Return Bin Access          lidocaine 1% PF Inj Soln 2 mL  :   lidocaine 1% PF Inj Soln 2 mL ; Status:   Ordered ; Ordered As Mnemonic:   lidocaine 1% preservative-free injectable solution ; Simple Display Line:   0.25 mL, ID, q73min, PRN: other (see comment) ; Ordering Provider:   Alcide Goodness,  CURTIS-MD; Catalog Code:   lidocaine ; Order Dt/Tm:   08/26/2019 06:23:36 EDT ; Comment:   to access lidocaine 1%  2 mL vial for IV start and Life Port access          lidocaine 2% Topical Gel with applicator 10-11 mL  :   lidocaine 2% Topical Gel with applicator 10-11 mL ; Status:   Ordered ; Ordered As Mnemonic:   Uro-Jet 2% topical gel with applicator ; Simple Display Line:   1 app, Topical, q39min, PRN: other (see comment) ; Ordering Provider:   Marzella Schlein; Catalog Code:   lidocaine topical ; Order Dt/Tm:   08/26/2019 06:23:36 EDT          Respiratory MDI Treatment  :   Respiratory MDI Treatment ; Status:   Ordered ; Ordered  As Mnemonic:   Respiratory MDI Treatment ; Simple Display Line:   1 EA, Kit-Combo, q42min, PRN: other (see comment) ; Ordering Provider:   Alcide Goodness,  CURTIS-MD; Catalog Code:   Respiratory MDI Treatment ; Order Dt/Tm:   08/26/2019 06:23:36 EDT          sodium chloride 0.9% Inj Soln 10 mL syringe  :   sodium chloride 0.9% Inj Soln 10 mL syringe ; Status:   Ordered ; Ordered As Mnemonic:   sodium chloride 0.9% flush syringe range dose ; Simple Display Line:   30 mL, IV Push, q59min, PRN: other (see comment) ; Ordering Provider:   Alcide Goodness,  CURTIS-MD; Catalog Code:   sodium chloride flush ; Order Dt/Tm:   08/26/2019 06:23:36 EDT          sodium chloride 0.9% Inj Soln 10 mL vial PF  :   sodium chloride 0.9% Inj Soln 10 mL vial PF ; Status:   Ordered ; Ordered As Mnemonic:   sodium chloride 0.9% vial for reconstitution range dose ; Simple Display Line:   30 mL, IV Push, q79min, PRN: other (see comment) ; Ordering Provider:    Alcide Goodness,  CURTIS-MD; Catalog Code:   sodium chloride flush ; Order Dt/Tm:   08/26/2019 06:23:36 EDT ; Comment:   for access to sodium chloride 0.9% vial when needed as a diluent for reconstitutable medications          sterile water Inj Soln 10 mL  :   sterile water Inj Soln 10 mL ; Status:   Ordered ; Ordered As Mnemonic:   sterile water for reconstitution ; Simple Display Line:   10 mL, N/A, q77min, PRN: other (see comment) ; Ordering Provider:   Alcide Goodness,  CURTIS-MD; Catalog Code:   sterile water for reconstitution ; Order Dt/Tm:   08/26/2019 06:23:36 EDT ; Comment:   Access sterile water when needed as a diluent for reconstitutable medications. Not for IV use.          loratadine 10 mg Tab  :   loratadine 10 mg Tab ; Status:   Ordered ; Ordered As Mnemonic:   loratadine ; Simple Display Line:   10 mg, 1 tabs, Oral, Daily, PRN: allergy symptoms ; Ordering Provider:   Alcide Goodness,  CURTIS-MD; Catalog Code:   loratadine ; Order Dt/Tm:   08/25/2019 17:36:42 EDT            Prescription/Discharge Order    acetaminophen-oxyCODONE  :   acetaminophen-oxyCODONE ; Status:   Prescribed ; Ordered As Mnemonic:   Percocet 5/325 oral tablet ; Simple Display Line:   1 tabs, Oral, q4hr, PRN: moderate pain (4-7), 30 tabs, 0 Refill(s) ; Ordering Provider:   Lanetta Inch; Catalog Code:   acetaminophen-oxyCODONE ; Order Dt/Tm:   08/30/2019 07:33:08 EDT ; Comment:   MAX DAILY DOSE OF ACETAMINOPHEN = 4000 MG            Home Meds    ondansetron  :   ondansetron ; Status:   Documented ; Ordered As Mnemonic:   ondansetron 4 mg oral tablet ; Simple Display Line:   4 mg, 1 tabs, Oral, q6hr, PRN: nausea/vomiting, 0 Refill(s) ; Ordering Provider:   Anselm Jungling P-FNP; Catalog Code:   ondansetron ; Order Dt/Tm:   08/30/2019 07:51:06 EDT          dexAMETHasone  :   dexAMETHasone ; Status:   Documented ; Ordered As Mnemonic:   dexAMETHasone ; Simple Display Line:  4 mg, 1 mL, Oral, qAM, 0 Refill(s) ; Ordering Provider:   Anselm Jungling P-FNP; Catalog Code:   dexAMETHasone ; Order Dt/Tm:   08/30/2019 07:50:14 EDT          docusate-senna  :   docusate-senna ; Status:   Documented ; Ordered As Mnemonic:   docusate-senna 50 mg-8.6 mg oral tablet ; Simple Display Line:   1 tabs, Oral, BID, 0 Refill(s) ; Ordering Provider:   Lanetta Inch; Catalog Code:   docusate-senna ; Order Dt/Tm:   08/29/2019 15:12:25 EDT          famotidine  :   famotidine ; Status:   Documented ; Ordered As Mnemonic:   Pepcid ; Simple Display Line:   20 mg, 2 mL, Oral, q12hr, 0 Refill(s) ; Ordering Provider:   Lanetta Inch; Catalog Code:   famotidine ; Order Dt/Tm:   08/29/2019 15:12:27 EDT          loratadine  :   loratadine ; Status:   Documented ; Ordered As Mnemonic:   loratadine 10 mg oral tablet ; Simple Display Line:   10 mg, 1 tabs, Oral, Daily, PRN: allergy symptoms, 0 Refill(s) ; Ordering Provider:   Lanetta Inch; Catalog Code:   loratadine ; Order Dt/Tm:   08/29/2019 15:12:05 EDT          ibuprofen  :   ibuprofen ; Status:   Discontinued ; Ordered As Mnemonic:   ibuprofen ; Simple Display Line:   600 mg, Oral, Once, 0 Refill(s) ; Catalog Code:   ibuprofen ; Order Dt/Tm:   08/26/2019 06:56:55 EDT          cyclobenzaprine  :   cyclobenzaprine ; Status:   Discontinued ; Ordered As Mnemonic:   cyclobenzaprine 10 mg oral tablet ; Simple Display Line:   10 mg, 1 tabs, Oral, TID, PRN, 30 tabs, 0 Refill(s) ; Catalog Code:   cyclobenzaprine ; Order Dt/Tm:   08/24/2019 09:04:10 EDT ; Comment:   " THIS MEDICATION IS ASSOCIATED   WITH   AN INCREASED RISK OF FALLS."          acetaminophen-oxyCODONE  :   acetaminophen-oxyCODONE ; Status:   Documented ; Ordered As Mnemonic:   Percocet 5/325 oral tablet ; Simple Display Line:   1-2 tabs, Oral, q6hr, PRN: moderate pain (4-7), 30 tabs, 0 Refill(s) ; Catalog Code:   acetaminophen-oxyCODONE ; Order Dt/Tm:   06/28/2019 11:24:53 EST ; Comment:   MAX DAILY DOSE OF ACETAMINOPHEN = 4000 MG           amitriptyline  :   amitriptyline ; Status:   Documented ; Ordered As Mnemonic:   amitriptyline 25 mg oral tablet ; Simple Display Line:   mg, tabs, Oral, Once a Day (at bedtime), 0 Refill(s) ; Catalog Code:   amitriptyline ; Order Dt/Tm:   06/28/2019 11:24:30 EST          cetirizine  :   cetirizine ; Status:   Discontinued ; Ordered As Mnemonic:   ZyrTEC ; Simple Display Line:   10 mg, Oral, Daily, PRN: allergy symptoms, 0 Refill(s) ; Ordering Provider:   Lanetta Inch; Catalog Code:   cetirizine ; Order Dt/Tm:   06/28/2019 11:24:43 EST            Problem History   (As Of: 08/30/2019 14:59:38 EDT)   Problems(Active)    Acute myelopathy (SNOMED CT  :8295621308 )  Name of  Problem:   Acute myelopathy ; Recorder:   LEWEY,  JENNIFER LYNN-FNP; Confirmation:   Confirmed ; Classification:   Medical ; Code:   [id number] ; Contributor System:   Dietitian ; Last Updated:   08/24/2019 9:04 EDT ; Life Cycle Status:   Active ; Responsible Provider:   Demetrius Revel LYNN-FNP; Vocabulary:   SNOMED CT        Back pain (SNOMED CT  :621308657 )  Name of Problem:   Back pain ; Recorder:   PYLE, RN, JOYCE; Confirmation:   Confirmed ; Classification:   Patient Stated ; Code:   [id number] ; Contributor System:   Dietitian ; Last Updated:   06/28/2019 11:25 EST ; Life Cycle Date:   06/28/2019 ; Life Cycle Status:   Active ; Vocabulary:   SNOMED CT        OSA (obstructive sleep apnea) (SNOMED CT  :841324401 )  Name of Problem:   OSA (obstructive sleep apnea) ; Recorder:   PYLE, RN, JOYCE; Confirmation:   Confirmed ; Classification:   Patient Stated ; Code:   [id number] ; Contributor System:   PowerChart ; Last Updated:   06/28/2019 11:25 EST ; Life Cycle Date:   06/28/2019 ; Life Cycle Status:   Active ; Vocabulary:   SNOMED CT        Sciatica (SNOMED CT  :40347425 )  Name of Problem:   Sciatica ; Recorder:   PYLE, RN, JOYCE; Confirmation:   Confirmed ; Classification:   Patient Stated ; Code:   [id number] ; Contributor System:    PowerChart ; Last Updated:   06/28/2019 11:25 EST ; Life Cycle Date:   06/28/2019 ; Life Cycle Status:   Active ; Vocabulary:   SNOMED CT          Procedure History        -    Procedure History   (As Of: 08/30/2019 14:59:38 EDT)     Anesthesia Minutes:   0 ; Procedure Name:   Hernia ; Procedure Minutes:   0 ; Last Reviewed Dt/Tm:   08/30/2019 14:57:45 EDT            Anesthesia Minutes:   0 ; Procedure Name:   Tonsillectomy ; Procedure Minutes:   0 ; Last Reviewed Dt/Tm:   08/30/2019 14:57:45 EDT            Procedure Dt/Tm:   06/29/2019 11:32:00 EST ; Location:   [facility] ; Provider:   Anda Latina; Anesthesia Type:   Local ; Anesthesia Minutes:   0 ; Procedure Name:   Transforaminal Epidural Ster Inj ; Procedure Minutes:   2 ; Comments:     06/29/2019 11:34 EST - [name], RN, [name] A  auto-populated from documented surgical case ; Clinical Service:   Surgery ; Last Reviewed Dt/Tm:   08/30/2019 14:57:45 EDT            Procedure Dt/Tm:   4332 ; Anesthesia Minutes:   0 ; Procedure Name:   thumb surgery ; Procedure Minutes:   0 ; Last Reviewed Dt/Tm:   08/30/2019 14:57:45 EDT            Procedure Dt/Tm:   04/22/2011 ; Anesthesia Minutes:   0 ; Procedure Name:   Lumbar fusion L4-5 ; Procedure Minutes:   0 ; Last Reviewed Dt/Tm:   08/30/2019 14:57:45 EDT            Procedure Dt/Tm:   9518 ; Anesthesia Minutes:  0 ; Procedure Name:   tonsils and adenoids ; Procedure Minutes:   0 ; Last Reviewed Dt/Tm:   08/30/2019 14:57:45 EDT            Procedure Dt/Tm:   2012 ; Anesthesia Minutes:   0 ; Procedure Name:   redo lumbar fusion L5-S1 ; Procedure Minutes:   0 ; Last Reviewed Dt/Tm:   08/30/2019 14:57:45 EDT            Anesthesia Minutes:   0 ; Procedure Name:   colonoscopy ; Procedure Minutes:   0 ; Last Reviewed Dt/Tm:   08/30/2019 14:57:45 EDT            Procedure Dt/Tm:   3875 ; Anesthesia Minutes:   0 ; Procedure Name:   facial surgery ; Procedure Minutes:   0 ; Last Reviewed Dt/Tm:   08/30/2019 14:57:45 EDT             Procedure Dt/Tm:   2005 ; Anesthesia Minutes:   0 ; Procedure Name:   L5-S1 lumbar fusion ; Procedure Minutes:   0 ; Last Reviewed Dt/Tm:   08/30/2019 14:57:45 EDT            Procedure Dt/Tm:   08/26/2019 08:40:00 EDT ; Location:   [facility] ; Provider:   Alcide Goodness,  CURTIS-MD; Anesthesia Type:   General ; :   Huel Cote L-MD; Anesthesia Minutes:   0 ; Procedure Name:   Anterior Cervical Discectomy with Fusion and/or Stabilization SCIP ; Procedure Minutes:   166 ; Comments:     08/26/2019 11:38 EDT - [name], RN, [name]  auto-populated from documented surgical case ; Clinical Service:   Surgery ; Last Reviewed Dt/Tm:   08/30/2019 14:57:45 EDT            Immunizations   Influenza Vaccine Status :   Not received   Influenza Vaccine Status :   Greater than 5 years   COVID-19 Vaccine Status :   Not received   Gladis Riffle, RN, Alisa A - 08/30/2019 14:57 EDT   ID Risk Screen Symptoms   Recent Travel History :   No recent travel   Last 14 days COVID-19 ID :   Yes - Not Detected (negative)   TB Symptom Screen :   No symptoms   C. diff Symptom/History ID :   Neither of the above   MRSA/VRE Screening :   <30 days from previous admission   Almeta Monas A - 08/30/2019 14:57 EDT   Bloodless Medicine   Is Blood Transfusion Acceptable to Patient :   Yes   Gladis Riffle, RN, Alisa A - 08/30/2019 14:57 EDT   Nutrition   MST Have You Recently Lost Weight Without Trying? :   Yes - see next question   MST If Yes, How Much Weight Have You Lost? :   2-13 lb   MST Weight Loss Score :   1    Gladis Riffle, RN, Alisa A - 08/30/2019 14:57 EDT   Functional   Sensory Deficits :   None   ADLs Prior to Admission :   Independent   Gladis Riffle, RN, Serina Cowper A - 08/30/2019 14:57 EDT   Social History   Social History   (As Of: 08/30/2019 14:59:38 EDT)   Tobacco:        Tobacco use: Never (less than 100 in lifetime).   (Last Updated: 08/25/2019 09:36:30 EDT by Demetrius Revel LYNN-FNP)  Electronic Cigarette/Vaping:        Never Electronic Cigarette Use.   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 :   No   Gladis Riffle, RN, Serina Cowper A - 08/30/2019 14:57 EDT   Harm Screen   Injuries/Abuse/Neglect in Household :   Denies   Feels Unsafe at Home :   No   Last 3 mo, thoughts killing self/others :   Patient denies   Earnestine Mealing - 08/30/2019 14:57 EDT   Advance Directive   Location of Advance Directive :   Unable to obtain copy   Advance Directive :   No   Type of Advance Directive :   Living will, Medical durable power of attorney   Earnestine Mealing - 08/30/2019 14:57 EDT   Education   Written Language :   [place]   Primary Language :   Vevelyn Royals, RN, Alisa A - 08/30/2019 14:57 EDT   Caregiver/Advocate Language   Patient :   None   Family :   None   Gladis Riffle, RN, Alisa A - 08/30/2019 14:57 EDT   Barriers to Learning :   None evident   Gladis Riffle RN, Serina Cowper A - 08/30/2019 14:57 EDT   Preventative Measures Information   Unit/Room Orientation :   [place] understanding   Environmental Safety :   [place] understanding   Hand Washing :   Verbalizes understanding   Infection Prevention :   Verbalizes understanding   DVT Prophylaxis :   Verbalizes understanding   Gladis Riffle, RN, Alisa A - 08/30/2019 14:57 EDT   DC Needs   CM Living Situation :   Family support, Home Health   Anticipated Discharge Needs :   Home health   [place], RN, Unice Cobble - 08/30/2019 14:57 EDT   Valuables and Belongings   Valuables and Belongings   At Bedside :   Clothes, Glasses, Cell phone   Gladis Riffle, RN, Alisa A - 08/30/2019 14:57 EDT   Admission Complete   Admission  Complete :   Yes   Gladis Riffle, RN, Alisa A - 08/30/2019 14:57 EDT

## 2019-08-30 NOTE — Progress Notes (Signed)
Inpatient PT Daily Documentation - Text       Inpatient PT Daily Documentation Entered On:  08/30/2019 11:13 EDT    Performed On:  08/30/2019 11:09 EDT by Cherie Dark               Reason for Treatment   Subjective Statement :   "I'm ready to walk." no new complaints.     *Reason for Referral :   pt s/p C5-6 ACDF with post op L side weakness    fall risk, cervical precautions; PMH: previous spinal surgeries     PTA Visit Acute :   PT visit   Cherie Dark - 08/30/2019 11:09 EDT   Pain Assessment   Pain Present :   Yes actual or suspected pain   Pain Present :   Neck   Self Report Pain :   Numeric rating scale   Numeric Pain Scale :   5 = Moderate pain   Numeric Pain Score :   5    Cherie Dark - 08/30/2019 11:09 EDT   Mobility   Mobility Grid   Transfer Sit to Stand :   Rehab Supervision, 1   Transfer Stand to Sit :   Rehab Supervision, 1   Cherie Dark - 08/30/2019 11:09 EDT   Balance   Balance Tests Performed :   Other: fair with AD. SLB B poor.   Cherie Dark - 08/30/2019 11:09 EDT   LE Range/Strength   Lt Lower Extremity Strength :   Within functional limits   Rt Lower Extremity Strength :   Within functional limits   Cherie Dark - 08/30/2019 11:09 EDT   Assessment   PT Impairments or Limitations :   Ambulation deficits, Balance deficits, Bed mobility deficits, Pain limiting function, Strength deficits, Transfer deficits   Discharge Recommendations :   eval: acute rehab  08/28/19: home with HHPT (pt has RW at home)  08/29/19: pt requesting to go to [facility] per MD suggestion     D/CTransportation Recommendations :   No stretcher   D/C Transportation Recommendations Reviewed :   Yes   PT Treatment Recommendations :   patient in recliner. instructed patient on sit to stand controlled descent and safety and use of UEs and posture. verbalized spinal precautions. sit to stand supervision and ambulated with Rw [id number] 150' with verbal cues and visual demo given for longer step lengths B  and heel strike. positioned in recliner. call bell in hand, nurse aware.     Cherie Dark - 08/30/2019 11:09 EDT   Time Spent With Patient   PT Time In :   9:23 EST   PT Time Out :   9:46 EST   PT Treatment Time Comment :   positioned in recliner, call bell in hand, needs met, nurse notified. good rehab candidate.     PT Gait Training Units :   2 units   PT Gait Training Time :   23 minutes   PT Total Timed Code Treatment Units :   2 units   PT Total Timed Code Min :   23    PT Total Treatment Time Acute/OP :   [address]    Cherie Dark - 08/30/2019 11:09 EDT

## 2019-08-31 LAB — CBC WITH AUTO DIFFERENTIAL
Basophils %: 0.1 % (ref 0.0–2.0)
Basophils Absolute: 0 10*3/uL (ref 0.0–0.2)
Eosinophils %: 0.5 % (ref 0.0–7.0)
Eosinophils Absolute: 0 10*3/uL (ref 0.0–0.5)
Hematocrit: 40.7 % (ref 38.0–52.0)
Hemoglobin: 13.8 g/dL (ref 13.0–17.3)
Immature Grans (Abs): 0.05 10*3/uL (ref 0.00–0.06)
Immature Granulocytes %: 0.6 % (ref 0.1–0.6)
Lymphocytes Absolute: 2.3 10*3/uL (ref 1.0–3.2)
Lymphocytes: 26.2 % (ref 15.0–45.0)
MCH: 30.2 pg (ref 27.0–34.5)
MCHC: 33.9 g/dL (ref 32.0–36.0)
MCV: 89.1 fL (ref 84.0–100.0)
MPV: 9.5 fL (ref 7.2–13.2)
Monocytes %: 14.1 % — ABNORMAL HIGH (ref 4.0–12.0)
Monocytes Absolute: 1.2 10*3/uL — ABNORMAL HIGH (ref 0.3–1.0)
NRBC Absolute: 0 10*3/uL (ref 0.000–0.012)
NRBC Automated: 0 % (ref 0.0–0.2)
Neutrophils %: 58.5 % (ref 42.0–74.0)
Neutrophils Absolute: 5.1 10*3/uL (ref 1.6–7.3)
Platelets: 289 10*3/uL (ref 140–440)
RBC: 4.57 x10e6/mcL (ref 4.00–5.60)
RDW: 11.9 % (ref 11.0–16.0)
WBC: 8.6 10*3/uL (ref 3.8–10.6)

## 2019-08-31 LAB — COMPREHENSIVE METABOLIC PANEL
ALT: 23 U/L (ref 0–41)
AST: 12 U/L (ref 0–40)
Albumin/Globulin Ratio: 1.56 mmol/L (ref 1.00–2.00)
Albumin: 3.9 g/dL (ref 3.5–5.2)
Alk Phosphatase: 66 U/L (ref 40–130)
Anion Gap: 8 mmol/L (ref 2–17)
BUN: 16 mg/dL (ref 6–20)
CALCIUM,CORRECTED,CCA: 9.1 mg/dL (ref 8.6–10.0)
CO2: 25 mmol/L (ref 22–29)
Calcium: 9 mg/dL (ref 8.6–10.0)
Chloride: 105 mmol/L (ref 98–107)
Creatinine: 0.8 mg/dL (ref 0.7–1.3)
GFR African American: 113 mL/min/{1.73_m2} (ref >=90)
GFR Non-African American: 98 mL/min/{1.73_m2} (ref >=90)
Globulin: 2.5 g/dL (ref 1.9–4.4)
Glucose: 95 mg/dL (ref 70–99)
Osmolaliy Calculated: 277 mosm/kg (ref 270–287)
Potassium: 4.1 mmol/L (ref 3.5–5.3)
Sodium: 138 mmol/L (ref 135–145)
Total Bilirubin: 0.3 mg/dL (ref 0.00–1.20)
Total Protein: 6.4 g/dL (ref 6.4–8.3)

## 2019-08-31 NOTE — Progress Notes (Signed)
Chaplaincy Note - Text       Chaplaincy Note Entered On:  08/31/2019 15:26 EDT    Performed On:  08/31/2019 15:26 EDT by Velna Ochs               Chaplaincy Consult   Faith/Denomination :   Ephriam Knuckles, Other: Nazarene   Additional Information, Chaplaincy :   not in rm. PC to f/u   Velna Ochs - 08/31/2019 15:26 EDT

## 2019-08-31 NOTE — Progress Notes (Signed)
 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 Post-up LUE and LLE weakness, left facial paresthesias, left-sided ptosis, vision changes. Started steroids with improvement of symptoms. Post-operative constipation noted, scheduled stool softeners. Patient reports a great appetite, food has been amazing and he is eating 100% meals, consistent with lunch tray empty at bedside table. No GI complaints at visit.    Nutrition history: patient has no difficulties with his appetite, no recent decline in appetite before admission. Patient eats two meals per day, breakfast and mid afternoon meals, many snacks in between. Loves meat, mostly red meat, does not really like chicken. Enjoys pepsi, several small cans at bedside. [place] on a daily basis.    PMHx: chronic back pain s/p multiple surgeries  Labs: (3/24) labs reviewed  Meds: decadron , docusate, pepcid, senna; prn percocet  frequent doses  Skin: neck surgical incision  GI: +bowel sounds, LBM 3/22, no N/V/C/D  Output: 2925 ml UOP x 24 hrs  Social/Cultural/Religious Considerations: supportive wife at bedside  Food Allergies: NKFA  Diet Order: none in chart, has been getting meals  Wt Hx: UBW = 180# (81.8kg). Patient reported ~10# weight loss on for ~2 month time frame on admission. EMR wt hx: 77 kg (Stated weight, 06/29/19), 78.3 kg (08/25/19), 76.4 kg (08/26/19). If reported UBW is accurate, pt exhibits 5.9% weight loss x 2.5 months, not nutritionally significant.   Wt trend this admit: 77 kg (estimated weight, 08/30/19)  NFPE: deferred, no visible muscle wasting/fat loss noted  HT: 177cm   WT: 77kg (3/23)  BMI: 24.58 (WNL)  MSJ: 1586   EER: 2062 kcal (MSJ x 1.3, 27  kcal/kg) EPR: 103 g protein (20% EER, 1.3 g/kg)  CHO: 258 g carb (50% EER) Fluid: 2310 cc (30 cc/kg) or per MD   Diagnosis: Not ready for diet/lifestyle change related to nutrition-related knowledge deficit as evidenced by patient reports frequent intake of high fat animal proteins and verbalizes feeling there is no need to change his diet because he has no health problems related to nutrition and has eaten the same foods his entire life.    Goals:   1. Meet 80-100% estimated needs during admission.   2. Become open to diet education for heart healthy dietary changes.   Intervention:   1. RD to place regular diet order with MD approval, no diet order in place in chart at present. Regular diet provides 1939 kcal, 110 g protein on average. Patient meeting 90% EER and 107% EPR with current PO intake.  2. Recommend checking lipid panel. Based on patient's reported PO intake, eats a diet high in saturated fat from animal protein. Places his at high risk for elevated lipid panel, as well as heart disease, stroke, and many other comorbidities.  3. Patient is very set in his ways with how he eats. Per wife's report, patient has never had any problems with his cholesterol despite reported diet intake they discussed at visit today. Based on discussion today, patient is not yet willing to make any dietary changes, no motivation to change at present time. Built some rapport today, will attempt to discuss nutrition further at f/u  and provide education on cardioprotective effects of heart healthy diet as able.   DISCHARGE - regular diet with focus on lean sources of protein and less high fat animal proteins, available for discharge planning as needed.  Monitoring/Evaluation: PO intake, education needs/openness to education, weight trends, labs, POC. Will follow-up within 7 days, consult PRN.  Jesse Shove, MS, RD, LD  Clinical Dietitian  Office: [phone]    Signature Line     Electronically Signed on 08/31/2019 01:37 PM  EDT   ________________________________________________   Savage RD, Jesse SAILOR

## 2019-08-31 NOTE — Progress Notes (Signed)
Chaplaincy Note - Text       Chaplaincy Note Entered On:  08/31/2019 15:26 EDT    Performed On:  08/31/2019 15:25 EDT by Velna Ochs               Chaplaincy Consult   Faith/Denomination :   Ephriam Knuckles, Other: Nazarene   Additional Information, Chaplaincy :   duplicate   Velna Ochs - 08/31/2019 15:25 EDT

## 2019-08-31 NOTE — Progress Notes (Signed)
 PT Inpatient Examination - Text       PT Inpatient Evaluation Entered On:  08/31/2019 21:28 EDT    Performed On:  08/31/2019 13:00 EDT by [name], PT, Amber               Reason for Treatment   *Reason for Referral :   Jesse Savage  DX:  cervical myelopathy due to ACDF C5/6 costeophytectomy and small durotomy with cord deformity 2* osteophyte, central bulge, stenosis , prior history of multiple lumbar fusion surgeries and revisions with continued back pain    3HRS/day     [name] ALMETA Gauze - 08/31/2019 21:34 EDT   Home Environment   Living Environment :   Home Environment  *ADL:  Independent  Performed By:  EDWIN GILLIE GATE E 08/31/2019  *Cognitive-Communication Skills:  Independent  Performed By:  EDWIN GILLIE GATE E 08/31/2019  *Instrumental ADL:  Independent  Performed By:  EDWIN, OT, MIRA E 08/31/2019  *Mobility:  Independent  Performed By:  EDWIN GILLIE GATE E 08/31/2019  Kitchen:  1st floor  Performed By:  EDWIN, OT, MIRA E 08/31/2019  Laundry:  1st floor  Performed By:  EDWIN, OT, MIRA E 08/31/2019  Lives In:  Single level home  Performed By:  EDWIN, OT, MIRA E 08/31/2019  Number of Stairs Inside:  1  Performed By:  EDWIN, OT, MIRA E 08/31/2019  Number of Stairs Outside:  3  Performed By:  EDWIN, OT, MIRA E 08/31/2019  Outside Stairs Rail:  Bilateral  Performed By:  EDWIN GILLIE GATE E 08/31/2019  Patient's Responsibilities:  Community mobility, Disabled, Hospital doctor, Landscape architect, Financial planner, Health and wellness, Home management, Leisure/Play/Hobbies, Manage Medications, Meal preparation, Out to eat, Personal ADL, Shopping, Social participation  Performed By:  EDWIN GILLIE GATE E 08/31/2019  Primary Bathroom:  1st floor  Performed By:  EDWIN GILLIE GATE E 08/31/2019  Primary Bedroom:  1st floor  Performed By:  EDWIN, OT, MIRA E 08/31/2019  Sensory Deficits:  None  Performed By:  [place], RN, Neil A 08/30/2019  Anticipated Need for Home Modification:  No  Performed By:  [name] 08/30/2019     Lives In  :   Single level home   Anticipated   Need For Home   Modification :   No   Hegeman, PT, Amber - 08/31/2019 21:20 EDT   Stairs     Inside Stairs  Outside Stairs        Number of Stairs :    1    3            Rail :       Bilateral             [place], PT, Amber - 08/31/2019 21:20 EDT  [name], PT, Amber - 08/31/2019 21:20 EDT        Home Setup   Primary Bedroom :   1st floor   Primary Bathroom :   1st floor   Kitchen :   1st floor   Laundry :   1st floor   [place], PT, Amber - 08/31/2019 21:20 EDT   Patient's Responsibilities :   Community mobility, Disabled, Hospital doctor, Landscape architect, Financial planner, Health and wellness, Water quality scientist, Leisure/Play/Hobbies, Manage Medications, Meal preparation, Out to eat, Personal ADL, Shopping, Social participation   [place], [place], Hospital doctor - 08/31/2019 21:20 EDT   Home Environment II   Living Environment :   Home Environment  Sensory Deficits:  None  Performed By:  Martell,  RN, Neil A 08/30/2019  Anticipated Need for Home Modification:  No  Performed By:  [name] 08/30/2019  Kitchen:  1st floor  Performed By:  [place] RN, Lauraine FORBES 08/30/2019  Laundry:  1st floor  Performed By:  [place] RN, Lauraine FORBES 08/30/2019  Lives In:  Single level home  Performed By:  [name] 08/30/2019  Number of Stairs Inside:  1  Performed By:  [name] 08/30/2019  Number of Stairs Outside:  3  Performed By:  [name] 08/30/2019  Outside Stairs Rail:  Bilateral  Performed By:  [name] 08/30/2019  Patient's Responsibilities:  Community mobility, Disabled, Hospital doctor, Landscape architect, Financial planner, Health and wellness, Home management, Leisure/Play/Hobbies, Manage Medications, Meal preparation, Out to eat, Personal ADL, Shopping, Social participation  Performed By:  [name] 08/30/2019  Primary Bathroom:  1st floor  Performed By:  [name] 08/30/2019  Primary Bedroom:  1st floor  Performed By:   [name] 08/30/2019     Hegeman, PT, Amber - 08/31/2019 21:20 EDT   Prior Functional Status   ADL :   Independent   Mobility :   Independent   Instrumental ADL :   Independent   Cognitive-Communication Skills :   Independent   Hegeman, PT, Amber - 08/31/2019 21:20 EDT   LE ROM/Strength   Left Lower Extremity Strength Grid   Hip Flexion :   4+   Hip Extension :   4+   Hip Abduction :   4   Knee Flexion :   4   Knee Extension :   4   Ankle Dorsiflexion :   4   Ankle Plantarflexion :   4   Hegeman, PT, Amber - 08/31/2019 21:20 EDT   Right Lower Extremity Strength Grid   Hip Flexion :   5   Hip Extension :   5   Hip Abduction :   5   Knee Flexion :   5   Knee Extension :   5   Ankle Dorsiflexion :   5   Ankle Plantarflexion :   4   Hegeman, PT, Amber - 08/31/2019 21:20 EDT   LE/Trunk Tone   Left Lower Extremity Tone   Left Lower Extremity :   Hypertonic   (Comment: + clonus [Hegeman, PT, Amber - 08/31/2019 21:32 EDT] )   Right Lower Extremity :   Normal   Hegeman, PT, Amber - 08/31/2019 21:32 EDT   Sensation   Left Upper Extremity Sensation   Light Touch :   Intact   Proprioception :   Impaired   Hegeman, PT, Amber - 08/31/2019 21:20 EDT   Right Upper Extremity Sensation   Light Touch :   Intact   Proprioception :   Impaired   Hegeman, PT, Amber - 08/31/2019 21:20 EDT   Impact of Impaired UE Sensation :   BUE   Hegeman, PT, Amber - 08/31/2019 21:20 EDT   Left Lower Extremity Sensation   Light Touch :   Impaired   (Comment: intact to localization, impaired intensity [Hegeman, PT, Amber - 08/31/2019 21:20 EDT] )   Proprioception :   Intact   Hegeman, PT, Amber - 08/31/2019 21:20 EDT   Right Lower Extremity Sensation   Light Touch :   Intact   Proprioception :   Intact   Hegeman, PT, Amber - 08/31/2019 21:20 EDT   LE Coordination  Trace Square Left Lower Extremity   Toe Taps :   Impaired   Hegeman, PT, Amber - 08/31/2019 21:32 EDT   Sitting Balance   Static Sitting Balance Assessment Grid   Sits Without UE Support :    Rehab Complete independence   Sits With One UE Support :   Rehab Complete independence   Sits With Two UE Support :   Rehab Complete independence   Hegeman, PT, Amber - 08/31/2019 21:20 EDT   Sitting Surface Evaluated Upon :   Bed   Hegeman, PT, Amber - 08/31/2019 21:20 EDT   Dynamic Sitting Balance Assessment Grid   Anterior Shift :   Able   Posterior Shift :   Able   Lateral to the Left Shift :   Able   Lateral to the Right Shift :   Able   Hegeman, PT, Amber - 08/31/2019 21:20 EDT   Righting Reactions Grid   Left Protective Reactions :   Present   Right Protective Reactions :   Present   Left Righting Reactions :   Present   Right Righting Reactions :   Present   Hegeman, PT, Amber - 08/31/2019 21:20 EDT   Standing Balance   Static Standing Balance   Stands Without UE Support :   Close supervision   Stands with One UE Support :   Close supervision   Stands with Two UE Support :   Rehab Modified independence   [place], PT, Amber - 08/31/2019 21:20 EDT   Standing Surface Evaluated Upon :   Floor   Hegeman, PT, Amber - 08/31/2019 21:20 EDT   DCP GENERIC CODE   Anterior Shift Standing Balance :   Able   Posterior Shift :   Able   Lateral to the Left Shift :   Able   Lateral to the Right Shift :   Able   Hegeman, PT, Amber - 08/31/2019 21:20 EDT   PT Mobility   PT Mobility 2019   Roll Left :   Independent - Patient/Resident completes the activities by him/herself, with or without an assistive device, with no assistance from a helper. - 06   Roll Right :   Independent - Patient/Resident completes the activities by him/herself, with or without an assistive device, with no assistance from a helper. - 06   Roll Left and Right :   Independent - Patient/Resident completes the activities by him/herself, with or without an assistive device, with no assistance from a helper. - 06   Roll Supine :   Independent - Patient/Resident completes the activities by him/herself, with or without an assistive device, with no assistance from a  helper. - 06   Sit to Lying :   Independent - Patient/Resident completes the activities by him/herself, with or without an assistive device, with no assistance from a helper. - 06   Lying to Sitting on Side of Bed :   Independent - Patient/Resident completes the activities by him/herself, with or without an assistive device, with no assistance from a helper. - 06   Sit to Stand :   Independent - Patient/Resident completes the activities by him/herself, with or without an assistive device, with no assistance from a helper. - 06   Stand to Sit :   Independent - Patient/Resident completes the activities by him/herself, with or without an assistive device, with no assistance from a helper. - 06   Chair, Bed to Chair Transfer :   Independent - Patient/Resident completes the activities by  him/herself, with or without an assistive device, with no assistance from a helper. - 06   Car Transfer :   Supervision or touching assistance - Helper provides verbal cues and/or touching/steadying and/or contact guard assistance as patient/resident completes activity. Assistance may be provided throughout the activity or intermittently. - 04   Hegeman, PT, Amber - 08/31/2019 21:20 EDT   Independent - Patient/Resident completes the activities by him/herself, with or without an assistive device, with no assistance from a helper. - 06 :   Pt independent without AD prior to admission--touching assist for mobility without device prior to intervention, mobility grid reflects use of RW post intervention   Hegeman, PT, Amber - 08/31/2019 21:20 EDT   PT Ambulation 2019   Walk 10 Feet :   Independent - Patient/Resident completes the activities by him/herself, with or without an assistive device, with no assistance from a helper. - 06   Walk 50 Feet with Two Turns :   Supervision or touching assistance - Helper provides verbal cues and/or touching/steadying and/or contact guard assistance as patient/resident completes activity. Assistance may be  provided throughout the activity or intermittently. - 04   Walk 150 Feet :   Supervision or touching assistance - Helper provides verbal cues and/or touching/steadying and/or contact guard assistance as patient/resident completes activity. Assistance may be provided throughout the activity or intermittently. - 04   Walking 10 Feet Uneven Surfaces :   Supervision or touching assistance - Helper provides verbal cues and/or touching/steadying and/or contact guard assistance as patient/resident completes activity. Assistance may be provided throughout the activity or intermittently. - 04   1 Step (Curb) :   Supervision or touching assistance - Helper provides verbal cues and/or touching/steadying and/or contact guard assistance as patient/resident completes activity. Assistance may be provided throughout the activity or intermittently. - 04   Ramp Ambulation :   Supervision or touching assistance - Helper provides verbal cues and/or touching/steadying and/or contact guard assistance as patient/resident completes activity. Assistance may be provided throughout the activity or intermittently. - 04   4 Steps :   Supervision or touching assistance - Helper provides verbal cues and/or touching/steadying and/or contact guard assistance as patient/resident completes activity. Assistance may be provided throughout the activity or intermittently. - 04   12 Steps :   Supervision or touching assistance - Helper provides verbal cues and/or touching/steadying and/or contact guard assistance as patient/resident completes activity. Assistance may be provided throughout the activity or intermittently. - 04   Hegeman, PT, Amber - 08/31/2019 21:20 EDT   Transfer Type :   Stand step   Ambulation Device Utilized :   Rolling walker   Distance Level Surface :   300 ft   Number of Stairs :   12    PT Mobility Reviewed :   Yes   Hegeman, PT, Amber - 08/31/2019 21:20 EDT   WC Management   Wheelchair Details :   N/A--pt ambulatory     PT  Wheelchair Mobilitiy Reviewed Rehab :   Chaney [name] KLEIN, Amber - 08/31/2019 21:20 EDT   Special Tests   PT Special Tests Grid     Test #1          Special Tests :    [name] KLEIN, Amber - 08/31/2019 21:20 EDT              Test Results :    1.93m/s Hegeman, PT, Amber - 08/31/2019 21:45 EDT  Special Tests Comments :    c RW at fastest gait speed                [place], PT, Amber - 08/31/2019 21:32 EDT         Assessment   PT Treatment Recommendations :   Pt is a 59 year old male with cervcial myelopathy that present to IP rehab following ACDF. Prior to admission, he was independent with all functional mobility and gait without AD and now requires use of RW and physical assist due to above listed impairments that limit his indep. He is most limited by L UE/LE weakness, clonus in L LE, impaired balance and sensation. He will benefit from skilled PT to progress indep and quality of gait without AD to progress to prior level of function. Given his current mobility status, past medical history and response to PT intervention, anticipate he can progress to independent level in 5-7 days for safe DC home with wife and follow up with OPPT upon DC.      Hegeman, PT, Amber - 08/31/2019 21:34 EDT   PT Impairments or Limitations :   Abnormal tone, Ambulation deficits, Balance deficits, Bed mobility deficits, Decreased knowledge of condition, Endurance deficits, Equipment training, Impaired sensation, Pain limiting function, Range of motion deficits, Strength deficits, Transfer deficits   Discharge Recommendations :   ELOS: 5-7 days to progress to PLOF without AD; home with wife-Rec OPPT     Hegeman, PT, Amber - 08/31/2019 21:32 EDT   Short Term Goals   Bed Mobility Goal Grid     Goal #1          Comments :    See LTG 2* ELOS                Hegeman, PT, Amber - 08/31/2019 21:34 EDT         PT ST Goals Reviewed :   Chaney [place], PT, Amber - 08/31/2019 21:34 EDT   PT Long Term Goals   PT Patient,Caregiver Goal :   I want  to be able to do everything for myself   [place], PT, Amber - 08/31/2019 21:34 EDT   Outpatient PT Long Term Goals Rehab     Long Term Goal 1          Goal :    Pt will ambulate with no device x 500' independently in 5 days              Status :    Initial                Hegeman, PT, Amber - 08/31/2019 21:34 EDT         Mobility Goal 2019   Roll Left and Right Goal :   Independent - Patient/Resident completes the activities by him/herself, with or without an assistive device, with no assistance from a helper. - 06   Sit to Lying Goal :   Independent - Patient/Resident completes the activities by him/herself, with or without an assistive device, with no assistance from a helper. - 06   Lying to Sitting Side of Bed Goal :   Independent - Patient/Resident completes the activities by him/herself, with or without an assistive device, with no assistance from a helper. - 06   Sit to Stand Goal :   Independent - Patient/Resident completes the activities by him/herself, with or without an assistive device, with no assistance from a helper. - 06   Chair,Bed to Chair  Transfer Goal :   Independent - Patient/Resident completes the activities by him/herself, with or without an assistive device, with no assistance from a helper. - 06   Car Transfer Goal :   Independent - Patient/Resident completes the activities by him/herself, with or without an assistive device, with no assistance from a helper. - 06   Hegeman, PT, Amber - 08/31/2019 21:34 EDT   Walk Goals 2019   Walk 10 Feet Goal :   Independent - Patient/Resident completes the activities by him/herself, with or without an assistive device, with no assistance from a helper. - 06   Walk 50 Feet with Two Turns Goal :   Independent - Patient/Resident completes the activities by him/herself, with or without an assistive device, with no assistance from a helper. - 06   Walk 150 Feet Goal :   Independent - Patient/Resident completes the activities by him/herself, with or without an  assistive device, with no assistance from a helper. - 06   Walk 10 Ft Uneven Surface Goal :   Independent - Patient/Resident completes the activities by him/herself, with or without an assistive device, with no assistance from a helper. - 06   1 Step (Curb) Goal :   Independent - Patient/Resident completes the activities by him/herself, with or without an assistive device, with no assistance from a helper. - 06   4 Steps Goal :   Independent - Patient/Resident completes the activities by him/herself, with or without an assistive device, with no assistance from a helper. - 06   12 Steps Goal :   Independent - Patient/Resident completes the activities by him/herself, with or without an assistive device, with no assistance from a helper. - 06   Picking Up Object Goal :   Independent - Patient/Resident completes the activities by him/herself, with or without an assistive device, with no assistance from a helper. - 06   Hegeman, PT, Amber - 08/31/2019 21:34 EDT   PT LT Goals Reviewed :   Yes   Hegeman, PT, Amber - 08/31/2019 21:34 EDT   Plan   PT Therapy Days Per Week :   5   PT Therapy Hours Per Day :   1.5   PT Therapy Duration :   4-7 Days   Notes Tasked :   Daily   Notes Duration Units :   1    PT Duration Unit Rehab :   Weeks   Treatments Planned :   Balance training, Basic activities of daily living, Bed mobility training, Body weight support treadmill training, Community/Work reintegration, Electrical stimulation, Equipment training, Functional training, Gait training, Group therapy, Manual therapy, Moist heat/ice, Neuromuscular reeducation, Pain management, Patient education, Self Care Management, Soft tissue massage/MFR, Stair training, Therapeutic activities, Therapeutic exercises, Transfer training   Treatment Plan/Goals Established With Patient/Caregiver :   Yes   Evaluation Complete :   Yes   Hegeman, PT, Amber - 08/31/2019 21:34 EDT   Time Spent With Patient   PT Time In :   13:00 EST   PT Time Out :   14:30  EST   PT Individual Eval Time, Moderate Complexity :   30 minutes   PT Evaluation Units, Moderate Complexity :   1 Unit   PT Gait Training Units :   2 units   PT Gait Training Time :   30 minutes   PT Self Care ADL Training Units :   2 units   PT Self Care ADL Training Time :   30 minutes  PT Total Individual Time Rehab :   90    PT Total Timed Code Treatment Unit Rehab :   4    PT Total Timed Code Treatment Minutes Rehab :   60    PT Total Untimed Code Treatmt Min Rehab :   30    PT Total Treatment Time Rehab :   90 minutes   Hegeman, PT, Amber - 08/31/2019 21:34 EDT   Section GG: Admission Mobility Abilities   Section GG: Admission Mobility 2019   *Roll Left and Right :   Independent - Patient/Resident completes the activities by him/herself, with or without an assistive device, with no assistance from a helper. - 06   *Sit to Lying :   Independent - Patient/Resident completes the activities by him/herself, with or without an assistive device, with no assistance from a helper. - 06   *Lying to Sitting on Side of Bed :   Independent - Patient/Resident completes the activities by him/herself, with or without an assistive device, with no assistance from a helper. - 06   *Sit to Stand :   Supervision or touching assistance - Helper provides verbal cues and/or touching/steadying and/or contact guard assistance as patient/resident completes activity. Assistance may be provided throughout the activity or intermittently. - 04   *Chair,Bed to Chair Transfer :   Supervision or touching assistance - Helper provides verbal cues and/or touching/steadying and/or contact guard assistance as patient/resident completes activity. Assistance may be provided throughout the activity or intermittently. - 04   *Car Transfer :   Supervision or touching assistance - Helper provides verbal cues and/or touching/steadying and/or contact guard assistance as patient/resident completes activity. Assistance may be provided throughout the  activity or intermittently. - [address], PT, Amber - 08/31/2019 21:34 EDT   [id number] Patient Walk :   Yes   Hegeman, PT, Amber - 08/31/2019 21:34 EDT   Section GG: Admission Ambulation 2019   *Walk 10 Feet :   Supervision or touching assistance - Helper provides verbal cues and/or touching/steadying and/or contact guard assistance as patient/resident completes activity. Assistance may be provided throughout the activity or intermittently. - 04   *Walk 50 Feet with Two Turns :   Not attempted due to medical condition or safety concerns - 88   *Walk 150 Feet :   Not attempted due to medical condition or safety concerns - 88   *Walk 10 Feet Uneven Surfaces :   Not attempted due to medical condition or safety concerns - 88   *1 Step (Curb) :   Not attempted due to medical condition or safety concerns - 87   *4 Steps :   Not attempted due to medical condition or safety concerns - 81   *12 Steps :   Not attempted due to medical condition or safety concerns - 49   *Picking Up Object :   Not attempted due to medical condition or safety concerns - 48   Hegeman, PT, Amber - 08/31/2019 21:34 EDT   [id number] Patient Use Wheelchair,Scooter :   No   Hegeman, PT, Amber - 08/31/2019 21:34 EDT

## 2019-08-31 NOTE — Progress Notes (Signed)
 OT Inpatient Evaluation - Text       OT Inpatient Evaluation Entered On:  08/31/2019 7:33 EDT    Performed On:  08/31/2019 7:33 EDT by KRAFT, OT, MIRA E               Reason for Treatment   *Reason for Referral :   Dx: ACDF C5/6 c osteophytectomy and small durotomy for cord deformity 2t osteophyte, central bulge, stenosis     Prec: Fall  Pt able to unbuckle seat belt, TC day Wed, 3hr tx,, reg diet.    PMH: myelopathy, chronic back pain, OSA, sciatica  Procedure/Surgical Hx:  lumbar fusion L4-5 (04/22/2011)  redo lumbar fusion L5-S1 (2012)  L5-S1 lumbar fusion (2005)  thumb surgery (1995)  facial surgery (1981)  Tonsils and adenoids (1972)  colonoscopy  hernia   tonsillectomy  transforaminal epidural steroid injection (07/08/2019)     KRAFT, OT, MIRA E - 08/31/2019 13:15 EDT     KRAFT, OT, MIRA E - 08/31/2019 13:35 EDT     General Information   Occupational Therapy Orders :   OT Evaluation and Treatment Inpatient Acute - 08/26/19 14:37:00 EDT, Stop date 08/26/19 14:37:00 EDT     Precautions RTF :   Precaution Orders   No qualifying data available.     Pain Present :   Yes actual or suspected pain   Orientation Assessment :   Oriented x 4   KRAFT, OT, MIRA E - 08/31/2019 13:15 EDT   Pain Assessment   Pain Present :   Yes actual or suspected pain   Pain Present :   Back   KRAFT, OT, MIRA E - 08/31/2019 13:15 EDT   Pain Assessment Detail   Pain Notification :   Notified nursing   EDWIN GILLIE COLENE FORBES - 08/31/2019 13:15 EDT   Home Environment   Living Environment :   Home Environment  Sensory Deficits:  None  Performed By:  Martell RN, Neil A 08/30/2019  Anticipated Need for Home Modification:  No  Performed By:  [name] 08/30/2019  Kitchen:  1st floor  Performed By:  [name] 08/30/2019  Laundry:  1st floor  Performed By:  Washington RN, Lauraine FORBES 08/30/2019  Lives In:  Single level home  Performed By:  [name] 08/30/2019  Number of Stairs Inside:  1  Performed By:  [name]  08/30/2019  Number of Stairs Outside:  3  Performed By:  [name] 08/30/2019  Outside Stairs Rail:  Bilateral  Performed By:  [name] 08/30/2019  Patient's Responsibilities:  Community mobility, Disabled, Hospital doctor, Landscape architect, Financial planner, Health and wellness, Home management, Leisure/Play/Hobbies, Manage Medications, Meal preparation, Out to eat, Personal ADL, Shopping, Social participation  Performed By:  [name] 08/30/2019  Primary Bathroom:  1st floor  Performed By:  [name] 08/30/2019  Primary Bedroom:  1st floor  Performed By:  [name] 08/30/2019     Lives In :   Single level home   KRAFT, OT, MIRA E - 08/31/2019 13:15 EDT   Rehabilitation Stairs Grid     Inside Stairs  Outside Stairs        Number of Stairs :    1    3            Rail :       Bilateral  KRAFT, OT, MIRA E - 08/31/2019 13:15 EDT  KRAFT, OT, MIRA E - 08/31/2019 13:15 EDT        Home Setup Grid   Primary Bedroom :   1st floor   Primary Bathroom :   1st floor   Kitchen :   1st floor   Laundry :   1st floor   [place], [place], MIRA E - 08/31/2019 13:15 EDT   Patient's Responsibilities Rehab :   Community mobility, Disabled, Hospital doctor, Landscape architect, Financial planner, Health and wellness, Home management, Leisure/Play/Hobbies, Manage Medications, Meal preparation, Out to eat, Personal ADL, Shopping, Social participation   [place], [place], [place] E - 08/31/2019 13:15 EDT   Home Environment II   Living Environment :   Home Environment  Sensory Deficits:  None  Performed By:  Martell, RN, Neil A 08/30/2019  Anticipated Need for Home Modification:  No  Performed By:  [name] 08/30/2019  Kitchen:  1st floor  Performed By:  [name] 08/30/2019  Laundry:  1st floor  Performed By:  [name] 08/30/2019  Lives In:  Single level home  Performed By:  [name] 08/30/2019  Number of Stairs Inside:  1  Performed By:   [name] 08/30/2019  Number of Stairs Outside:  3  Performed By:  [name] 08/30/2019  Outside Stairs Rail:  Bilateral  Performed By:  [name] 08/30/2019  Patient's Responsibilities:  Community mobility, Disabled, Hospital doctor, Landscape architect, Financial planner, Health and wellness, Home management, Leisure/Play/Hobbies, Manage Medications, Meal preparation, Out to eat, Personal ADL, Shopping, Social participation  Performed By:  [name] 08/30/2019  Primary Bathroom:  1st floor  Performed By:  [name] 08/30/2019  Primary Bedroom:  1st floor  Performed By:  [name] 08/30/2019     KRAFT, OT, MIRA E - 08/31/2019 13:15 EDT   Prior Functional Status Grid   ADL :   Independent   Mobility :   Independent   Instrumental ADL :   Independent   Cognitive-Communication Skills :   Independent   KRAFT, OT, MIRA E - 08/31/2019 13:15 EDT   OT Basic ADL   OT Basic ADL 2019   Eating :   Setup or clean-up assistance - Helper sets up or cleans up; Patient/Resident completes activity. Helper assists only prior to or following the activity. - 05   Oral Hygiene :   Setup or clean-up assistance - Helper sets up or cleans up; Patient/Resident completes activity. Helper assists only prior to or following the activity. - 05   Toileting Hygiene :   Supervision or touching assistance - Helper provides verbal cues and/or touching/steadying and/or contact guard assistance as patient/resident completes activity. Assistance may be provided throughout the activity or intermittently. - 04   Toilet Transfer :   Supervision or touching assistance - Helper provides verbal cues and/or touching/steadying and/or contact guard assistance as patient/resident completes activity. Assistance may be provided throughout the activity or intermittently. - 04   Shower, Bathe Self :   Supervision or touching assistance - Helper provides verbal cues and/or touching/steadying and/or  contact guard assistance as patient/resident completes activity. Assistance may be provided throughout the activity or intermittently. - 04   Upper Body Dressing :   Setup or clean-up assistance - Helper sets up or cleans up; Patient/Resident completes activity. Helper assists only prior to or following the activity. - 05  Lower Body Dressing :   Supervision or touching assistance - Helper provides verbal cues and/or touching/steadying and/or contact guard assistance as patient/resident completes activity. Assistance may be provided throughout the activity or intermittently. - 04   Putting On, Taking Off Footwear :   Supervision or touching assistance - Helper provides verbal cues and/or touching/steadying and/or contact guard assistance as patient/resident completes activity. Assistance may be provided throughout the activity or intermittently. - 04   Grooming :   Setup or clean-up assistance - Helper sets up or cleans up; Patient/Resident completes activity. Helper assists only prior to or following the activity. - 05   Tub Transfer :   Not attempted due to environmental limitations - 10   Shower Transfer :   Not attempted due to environmental limitations - 10   KRAFT, OT, MIRA E - 08/31/2019 13:15 EDT   ADL Comments :   Not all visualized, partially based on clinical judgment from functional and assessment observations this date.      Limiting Factors :   Motor, Pain, Sensory   OT ADL Reviewed :   Yes   KRAFT, OT, MIRA E - 08/31/2019 13:15 EDT   Sitting Balance Assessment   Sitting Surface Evaluated Upon :   Bed   KRAFT, OT, MIRA E - 08/31/2019 13:15 EDT   Static Sitting Balance Assessment Grid   Sits Without UE Support :   Rehab Complete independence   Sits With One UE Support :   Rehab Complete independence   Sits With Two UE Support :   Rehab Complete independence   KRAFT, OT, MIRA E - 08/31/2019 13:15 EDT   Cognition   Cognition :   Within functional limits   KRAFT, OT, MIRA E - 08/31/2019 13:15 EDT   Attention  Cognition Grid   Alternating :   Impaired   Divided :   Impaired   KRAFT, OT, MIRA E - 08/31/2019 13:15 EDT   Memory Cognition Grid   Retrieval :   Impaired   KRAFT, OT, MIRA E - 08/31/2019 13:15 EDT   Vision/Perception   Vision Status :   Other: Blurry vision L eye, peripheral vision intact, scanning all fields, noted pupil more constricted on L vs. R. MD ok'd trial of eye patch.   KRAFT, OT, MIRA E - 08/31/2019 13:15 EDT   UE Coordination   9 Hole Peg Test Left :   44 seconds   Right 9 Hole Peg Test :   21 seconds   Mean Age Gender Lt 9 Hole Peg Test :   21.64   Right Mean for Age/Gender :   20.90   KRAFT, OT, MIRA E - 08/31/2019 13:15 EDT   Left Upper Extremity Coordination Grid   Diadochokinesia :   Within functional limits   Finger to Nose :   Within functional limits   Finger Opposition :   Within functional limits   KRAFT, OT, MIRA E - 08/31/2019 13:15 EDT   Right Upper Extremty Coordination Grid   Diadochokinesia :   Within functional limits   Finger to Nose :   Within functional limits   Finger Opposition :   Within functional limits   KRAFT, OT, MIRA E - 08/31/2019 13:15 EDT   Other Coordination Tests,Results :   slower on L, coord deficits on L with finger to nose.   KRAFT, OT, MIRA E - 08/31/2019 13:15 EDT   UE Sensation   Left Sensation Grid   Light Touch :  Intact   Proprioception :   Impaired   KRAFT, OT, MIRA E - 08/31/2019 13:15 EDT   Right Sensation Grid   Light Touch :   Intact   Proprioception :   Impaired   KRAFT, OT, MIRA E - 08/31/2019 13:15 EDT   Impact of Impaired UE Sensation :   BUE   KRAFT, OT, MIRA E - 08/31/2019 13:15 EDT   UE Function   Left Grip Strength Trial 1 :   35 lb   Left Grip Strength Trial 2 :   32 lb   Left Grip Strength Trial 3 :   30 lb   Left Hand Grip Strength :   32.3 lb   Mean for Age,Gender Left Hand Grip :   83.2 lbs   Right Grip Strength Trial 1 :   94 lb   Right Grip Strength Trial 2 :   114 lb   Right Grip Strength Trial 3 :   110 lb   Right Hand Grip Strength :   106 lb    Mean for Age,Gender Right Hand Grip :   101.1 lbs   KRAFT, OT, MIRA E - 08/31/2019 13:15 EDT   Upper Extremity Functional Scale Grid   Left :   Refined functional assist   Right :   Functional   KRAFT, OT, MIRA E - 08/31/2019 13:15 EDT   Special Tests   OT Special Tests Grid     Test #1  Test #2        Special Test :    Line bisection test   Star cancellation test           Test Results :    100 accurate   no errors             KRAFT, OT, MIRA E - 08/31/2019 13:15 EDT  KRAFT, OT, MIRA E - 08/31/2019 13:15 EDT        Education   Responsible Learner Present for Session :   Yes   Barriers To Learning :   None evident   Teaching Method :   Demonstration, Explanation   KRAFT, OT, MIRA E - 08/31/2019 13:15 EDT   Occupational Therapy Education Grid   Activity of Daily Living Training :   Research scientist (life sciences) :   Verbalizes understanding, Needs further teaching, Needs practice/supervision   Spinal Cord Specific Education :   Verbalizes understanding, Needs further teaching, Needs practice/supervision   KRAFT, OT, MIRA E - 08/31/2019 13:15 EDT   Assessment   OT Impairments or Limitations :   Balance deficits, Basic activity of daily living deficits, Coordination deficits, Decreased knowledge of condition, Endurance deficits, Equipment training, IADL deficits, Mobility deficits, Pain, Proprioception deficits, Sensory deficits, Strength deficits, Visual perceptual deficits   Barriers to Safe Discharge OT :   Past medical history, Severity of deficits   OT Discharge Recommendations :   Home c outpt tx services vs. HH to follow.      KRAFT, OT, MIRA E - 08/31/2019 13:15 EDT   OT Treatment Recommendations :   Pt presents as above, with strength & sensory deficits in L hemibody incl. LLE and both GM and FM ranges in LUE, lumbar pain & L eye blurry vision and resultant balance and coord deficits limiting his safety and I with ADL and IADL. Pt performs BADL grossly at SUP level, goals for I by d/c next week. Pt will  benefit from skilled OT tx services to  address and improve his functional safety and performance.      KRAFT, OT, MIRA E - 08/31/2019 13:35 EDT     Long Term Goals   OT Patient/Caregiver Goal :    to do small things with my hands   KRAFT, OT, MIRA E - 08/31/2019 13:15 EDT   OT IP Long Term Goals Grid     Long Term Goal 1  Long Term Goal 2        Goal :    Pt will improve LUE 9hole peg test time by 5 seconds indicating improved function on FM component of ADL.   Pt will increase L grip strength by 3lbs indicating improved function on FM component of ADL.           Status :    Initial   Initial             KRAFT, OT, MIRA E - 08/31/2019 13:15 EDT  KRAFT, OT, MIRA E - 08/31/2019 13:15 EDT        OT Long Term Goals 2019   Eating Goal :   Independent - Patient/Resident completes the activities by him/herself, with or without an assistive device, with no assistance from a helper. - 06   Oral Hygiene Goal :   Independent - Patient/Resident completes the activities by him/herself, with or without an assistive device, with no assistance from a helper. - 06   Toileting Hygiene Goal :   Independent - Patient/Resident completes the activities by him/herself, with or without an assistive device, with no assistance from a helper. - 06   Shower, Bathe Self Goal :   Independent - Patient/Resident completes the activities by him/herself, with or without an assistive device, with no assistance from a helper. - 06   Upper Body Dressing Goal :   Independent - Patient/Resident completes the activities by him/herself, with or without an assistive device, with no assistance from a helper. - 06   Lower Body Dressing Goal :   Independent - Patient/Resident completes the activities by him/herself, with or without an assistive device, with no assistance from a helper. - 06   Put On,Taking Off Footwear Goal :   Independent - Patient/Resident completes the activities by him/herself, with or without an assistive device, with no assistance from a  helper. - 06   Toilet Transfer Goal :   Independent - Patient/Resident completes the activities by him/herself, with or without an assistive device, with no assistance from a helper. - 06   KRAFT, OT, MIRA E - 08/31/2019 13:15 EDT   OT LT Goals Reviewed :   Yes   KRAFT, OT, MIRA E - 08/31/2019 13:15 EDT   Short Term Goals   Eating Goal Grid     Goal #1          Comment :    short los                KRAFT, OT, [place] E - 08/31/2019 13:15 EDT         Other OT Goal Grid     Goal #1          Goal :    Pt will stand x5 mins while completing BUE activity c SUP in order to improve standing tolerance for ADL.                      KRAFT, OT, MIRA E - 08/31/2019 13:15 EDT  OT ST Goals Reviewed :   Yes   KRAFT, OT, MIRA E - 08/31/2019 13:15 EDT   Plan   OT Therapy Days Per Week :   5   OT Therapy Hours Per Day :   1.5   OT Therapy Duration :   1 Week   Notes Tasked :   Daily   Planned Treatments :   Balance training, Basic Activities of Daily Living, Caregiver training, Coordination, Edema management, Education for meticulous skin care and hygiene, Copywriter, advertising, Energy conservation training, Equipment training, Group therapy, HEP, Home management, Home program, Joint protection, Kinesio taping, Manual therapy, Mobility training, Neuromuscular reeducation, Pain management, Patient education, Safety education, Sensory integration, Soft Tissue Mobilization, Therapeutic activities, Therapeutic exercises, Therapeutic exercises for strengthening and ROM, Thermal modalities, Visual and perceptual training, Work simplification   Treatment Plan/Goals Established With Patient/Caregiver :   Yes   KRAFT, OT, MIRA E - 08/31/2019 13:15 EDT   Time Spent With Patient   OT Time In :   9:00 EST   OT Time Out :   10:30 EST   OT Evaluation Units, Low Complexity :   1 Unit   OT Individual Eval Time, Low Complexity :   15 minutes   OT FUNCTIONAL TRNG 15 MIN :   5 units   OT Functional Activities Minutes :   75 minutes   OT Total Individual  Therapy Time Rehab :   90    OT Total Timed Code Treatment Unit Rehab :   5    OT Total Timed Code Treatment Minutes Rehab :   75    OT Total Untimed Code Treatment Min Rehab :   15    OT Total Treatment Time Rehab :   90 minutes   KRAFT, OT, MIRA E - 08/31/2019 13:15 EDT   Image 1 -  Images currently included in the form version of this document have not been included in the text rendition version of the form.   Section C: Cognitive Patterns   Patient Makes Self Understood, Verbally or in Writing :   Understood   Words Patient Repeated :   Sock, Blue, Bed   What Year Is It Right Now :   2021   Accuracy of Year Response :   Correct   What Month Is It Right Now :   March   Accuracy of Month Response :   Accurate within 5 days   What Day of the Week Is It :   Wednesday   Accuracy of Day of Week Response :   Correct   BIMS Able to Recall Sock :   Yes, no cue required   BIMS Able to Recall Blue :   No, could not recall   BIMS Able to Recall Bed :   No, could not recall   BIMS Summary Score :   11    KRAFT, OT, MIRA E - 08/31/2019 13:15 EDT   Section GG: Self Care Abilities   Section GG: Self Care Abilities 2019   *Eating :   Setup or clean-up assistance - Helper sets up or cleans up; Patient/Resident completes activity. Helper assists only prior to or following the activity. - 05   *Oral Hygiene :   Setup or clean-up assistance - Helper sets up or cleans up; Patient/Resident completes activity. Helper assists only prior to or following the activity. - 05   *Toileting Hygiene :   Supervision or touching assistance - Helper provides verbal cues  and/or touching/steadying and/or contact guard assistance as patient/resident completes activity. Assistance may be provided throughout the activity or intermittently. - 04   *Shower, Bathe Self :   Supervision or touching assistance - Helper provides verbal cues and/or touching/steadying and/or contact guard assistance as patient/resident completes activity. Assistance may be  provided throughout the activity or intermittently. - 04   *Upper Body Dressing :   Setup or clean-up assistance - Helper sets up or cleans up; Patient/Resident completes activity. Helper assists only prior to or following the activity. - 05   *Lower Body Dressing :   Supervision or touching assistance - Helper provides verbal cues and/or touching/steadying and/or contact guard assistance as patient/resident completes activity. Assistance may be provided throughout the activity or intermittently. - 04   *Putting On, Taking Off Footwear :   Supervision or touching assistance - Helper provides verbal cues and/or touching/steadying and/or contact guard assistance as patient/resident completes activity. Assistance may be provided throughout the activity or intermittently. - 04   *Toilet Transfer :   Supervision or touching assistance - Helper provides verbal cues and/or touching/steadying and/or contact guard assistance as patient/resident completes activity. Assistance may be provided throughout the activity or intermittently. - 04   KRAFT, OT, MIRA E - 08/31/2019 13:15 EDT

## 2019-08-31 NOTE — Case Communication (Signed)
CM Discharge Planning Assessment - Text       CM Discharge Planning Ongoing Assessment Entered On:  08/31/2019 17:54 EDT    Performed On:  08/31/2019 17:51 EDT by Duffy Rhody T               Discharge Needs I   Previously Documented Discharge Needs :   DISCHARGE PLAN/NEEDS:  EQUIPMENT/TREATMENT NEEDS:       Previously Documented Benefits Information :   Performed By: Delma Freeze  - 08/30/19 09:40:00       Anticipated Discharge Date :   09/06/2019 EDT   Anticipated Discharge Time Slot :   1000-1200   CM Progress Note :   Patient is a 59 year old male who presented to [facility] to have a scheduled procedure for his neck pain, unsteadiness of gait, and tendency to stumble when he walks or turns briskly.  He was also having weakness in his hands, radicular pain as well.  Patient was being followed by Dr. Alcide Goodness for lower back pain that had not responded to conversative management, with a disc osteophyte at L3-4 on the left.  He had recently received a selective nerve root block at L3-4 and had excellent relief of his pain for 3-4 days, then his pain returned.  At his most recent office visit, he complained of shoulder blade pain on the left, intrascapular pain, and suboccipital headaches.  Dr. Alyce Pagan NP, Anselm Jungling, wanted a cervical and thoracic MRI d/t these new complaints.    08/18/19 MRI cervical spine w/o contrast:  IMPRESSION:      1.  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 No  abnormal cord signal.    2.  At C4-C5, central bulge with mild ventral cord deformity.    3.  Between moderately severe and severe foraminal stenosis on the right at  C3-C4, on the left at C4-C5, bilaterally at C5-C6 and on the left at C6-C7.    It was felt that he would benefit from an ACDF at C5/6.  He admitted 3/19 and had the following scheduled surgery:  C5-C6 ACDF including osteophytectomy; also with small durotomy. Patient had post-operative weakness and numbness.   MRI of cervical spine was obtained post-op:  IMPRESSION: Expected postoperative changes at C5-C6 again with canal stenosis  and similar cord deformity. No cord signal abnormality or concerning fluid  collection.    Per notes, his RUE/RLE muscle strength was completely normal.  His left hand grip was weak, as were his triceps, biceps, and deltoids about a 3.5 out of 5.  His LLE was diffusely weak.  It was felt that this was d/t some form of neuropraxia abnormality or contusion . He was started on steroids and transferred to the ICU post-op.  3/19 CT head/brain showed no evidence of acute intracranial abnormality.  Per Pumonology consult in ICU, patient reported blurry vision of his left eye, left hand grip weakness and paresthesias, left lower extremity weakness and paresthesias.    3/20 Patient's neck pain was somewhat improved.  He was c/o dysesthetic pain in the hand and slightly up the forearm on the left side.  He continued to c/o blurred vision in the left eye and numbness/tingling in his face.  He was also having some spasms in BLE.  It was felt that he would benefit from an MRI or CTA to further evaluate the carotid artery on the right.  Patient was evaluated by therapies, who recommended  he transfer to acute inpatient rehab prior to returning home.  He was stable and transferred out of the ICU to the Neurospine unit.  He continued to improve.  Patient able to wear a c-collar if desired for stability.  3/22 MRA head w/o contrast revealed a normal MRA of the head.  Patient continued to progress with therapies.    3/23 He is eager to transfer to acute inpatient rehab to continue his recovery prior to returning home.      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  PT has MCR primary & lives w/spouse Baker Janus [phone]).  First team conf tomorrow. KD    3/24 Team conf today, target Dc 3/30 with Dc goals for mod I.  I dw pt & stepdau at bedside.  Team rec oupt thx;  pt prefers [employer] & reports he has DME in place at home. KD     Ulyses Southward T - 08/31/2019 17:51 EDT   Discharge Needs II   Professional Skilled Services :   Occupational Therapy, Physical Therapy   Needs Assistance with Transportation :   Yes   Discharge Transportation Arranged :   N/A   Needs Assistance at Home Upon Discharge :   No   Requires Caregiver Involvement :   No   Discharge Planning Time Spent :   45 minutes   Ulyses Southward T - 08/31/2019 17:51 EDT

## 2019-09-01 LAB — CBC
Hematocrit: 42.4 % (ref 38.0–52.0)
Hemoglobin: 14 g/dL (ref 13.0–17.3)
MCH: 29.7 pg (ref 27.0–34.5)
MCHC: 33 g/dL (ref 32.0–36.0)
MCV: 90 fL (ref 84.0–100.0)
MPV: 9.5 fL (ref 7.2–13.2)
NRBC Absolute: 0 10*3/uL (ref 0.000–0.012)
NRBC Automated: 0 % (ref 0.0–0.2)
Platelets: 264 10*3/uL (ref 140–440)
RBC: 4.71 x10e6/mcL (ref 4.00–5.60)
RDW: 11.9 % (ref 11.0–16.0)
WBC: 9.1 10*3/uL (ref 3.8–10.6)

## 2019-09-01 LAB — CULTURE, MRSA, SURVEILLANCE

## 2019-09-01 NOTE — Progress Notes (Signed)
 OT Inpatient Daily Documentation - Text       OT Inpatient Daily Documentation Entered On:  09/01/2019 12:43 EDT    Performed On:  09/01/2019 12:28 EDT by KRAFT, OT, MIRA E               Reason for Treatment   Subjective Statement :   I took a shower yesterday.      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    PMH: myelopathy, chronic back pain, OSA, sciatica  Procedure/Surgical Hx:  lumbar fusion L4-5 (04/22/2011)  redo lumbar fusion L5-S1 (2012)  L5-S1 lumbar fusion (2005)  thumb surgery (1995)  facial surgery (1981)  Tonsils and adenoids (1972)  colonoscopy  hernia   tonsillectomy  transforaminal epidural steroid injection (07/08/2019)     KRAFT, OT, MIRA E - 09/01/2019 12:28 EDT   Review/Treatments Provided   OT Goals :   OT Short Term Goals    08/31/2019  Eating Goal #1: short los  Other Goal #1: Pt will stand x5 mins while completing BUE activity c SUP in order to improve standing tolerance for ADL.     OT Plan :   Treatment Frequency: Daily Performed By: EDWIN GILLIE GATE E   08/31/2019  Planned Treatments: Balance training, Basic Activities of Daily Living, Caregiver training, Coordination, Edema management, Education for meticulous skin care and hygiene, Electric modalities, Energy conservation training, Equipment training, Group therapy, HEP, Home mana... Performed By: EDWIN GILLIE GATE E   08/31/2019     Short Term Goals Reviewed :   Yes   Occupational Therapy Orders :   No active Occupational Therapy orders.     Pain Present :   Yes actual or suspected pain   OT Therapeutic Activity,Mobility,Balance :   Yes   KRAFT, OT, MIRA E - 09/01/2019 12:28 EDT   Pain Assessment   Pain Present :   No actual or suspected pain   KRAFT, OT, MIRA E - 09/01/2019 12:28 EDT   Therapeutic Activities   OT Therapeutic Activities RTF :   Therapeutic Activities    No  qualifying data available     KRAFT, OT, MIRA E - 09/01/2019 12:28 EDT   OT Therapeutic Activities Grid     Activity 1          Activities :    Grasp activities, Pinch activities, Prehension activities              Comments :    Pt completed perdue pegboard and grooved pegboard activity x>50 reps/manipulatives c ea UE, including use of tweezers at times. Pt completed part of act stance level c distant SUP, up to approx. 3 mins at a time and partially seated.                 KRAFT, OT, MIRA E - 09/01/2019 12:28 EDT         Short Term Goals   Eating Goal Grid     Goal #1          Comment :    [address]                [place], OT, [place] E - 09/01/2019 12:28 EDT         Other OT Goal Grid  Goal #1          Goal :    Pt will stand x5 mins while completing BUE activity c SUP in order to improve standing tolerance for ADL.                      KRAFT, OT, MIRA E - 09/01/2019 12:28 EDT         OT ST Goals Reviewed :   Yes   KRAFT, OT, MIRA E - 09/01/2019 12:28 EDT   Education   Responsible Learner Present for Session :   Yes   Barriers To Learning :   Emotional state   Teaching Method :   Demonstration, Explanation   KRAFT, OT, MIRA E - 09/01/2019 12:28 EDT   Occupational Therapy Education Grid   Body Mechanics :   Verbalizes understanding   KRAFT, OT, MIRA E - 09/01/2019 12:28 EDT   Assessment   OT Impairments or Limitations :   Balance deficits, Basic activity of daily living deficits, Coordination deficits, Decreased knowledge of condition, Endurance deficits, Equipment training, IADL deficits, Mobility deficits, Pain, Proprioception deficits, Sensory deficits, Strength deficits, Visual perceptual deficits   Barriers to Safe Discharge OT :   Past medical history, Severity of deficits   OT Discharge Recommendations :   Home c outpt tx services vs. HH to follow.      OT Treatment Recommendations :   Pt tolerated well, ed and training provided on improved posture during FM ex incl. eliminating upper traps activation and GH  abduction, retraction/depression scapluae durign functional mob and activities. Further ed on sensory symptomes following ACDF as pt states he noted some paraesthesia in R hand he had not before when he had taken a shower sans SUP yesterday. Pt is aware that he was not cleared for I showers, but states he was frustrated by his care. Reports will wait for SUP from OT or otherwise next time. Ed on monitoring sensory symptoms for distinct change, but ed that not unual to experience symptoms bilaterally or perhaps not having noticed before due to other prevailing symptoms. Tx focus in gym on FM coordination and stance for improved I with FM and stance component with ADL.      KRAFT, OT, MIRA E - 09/01/2019 12:28 EDT   Time Spent With Patient   OT Time In :   10:00 EST   OT Time Out :   10:30 EST   OT FUNCTIONAL TRNG 15 MIN :   2 units   OT Functional Activities Minutes :   30 minutes   OT Total Individual Therapy Time Rehab :   30    OT Total Timed Code Treatment Unit Rehab :   2    OT Total Timed Code Treatment Minutes Rehab :   30    OT Total Treatment Time Rehab :   30 minutes   KRAFT, OT, MIRA E - 09/01/2019 12:28 EDT   Image 1 -  Images currently included in the form version of this document have not been included in the text rendition version of the form.

## 2019-09-01 NOTE — Progress Notes (Signed)
 OT Inpatient Daily Documentation - Text       OT Inpatient Daily Documentation Entered On:  09/01/2019 10:59 EDT    Performed On:  09/01/2019 10:58 EDT by Dorena, OT, [facility]               Reason for Treatment   Subjective Statement :   Jesse Savage states, When I woke up from anesthesia, I couldn't move my left side. I've come a long way.     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    PMH: myelopathy, chronic back pain, OSA, sciatica  Procedure/Surgical Hx:  lumbar fusion L4-5 (04/22/2011)  redo lumbar fusion L5-S1 (2012)  L5-S1 lumbar fusion (2005)  thumb surgery (1995)  facial surgery (1981)  Tonsils and adenoids (8027)  colonoscopy  hernia   tonsillectomy  transforaminal epidural steroid injection (07/08/2019)     [place], OT, [facility] - 09/01/2019 10:58 EDT   Review/Treatments Provided   Short Term Goals Reviewed :   Yes   Pain Present :   Yes actual or suspected pain   OT Therapeutic Activity,Mobility,Balance :   Yes   OT Manual Therapy Provided :   Yes   Dorena GILLIE [facility] - 09/01/2019 12:35 EDT   OT Goals :   OT Short Term Goals    08/31/2019  Eating Goal #1: short los  Other Goal #1: Jesse Savage will stand x5 mins while completing BUE activity c SUP in order to improve standing tolerance for ADL.     OT Plan :   Treatment Frequency: Daily Performed By: EDWIN GILLIE GATE E   08/31/2019  Planned Treatments: Balance training, Basic Activities of Daily Living, Caregiver training, Coordination, Edema management, Education for meticulous skin care and hygiene, Electric modalities, Energy conservation training, Equipment training, Group therapy, HEP, Home mana... Performed By: EDWIN GILLIE, MIRA E   08/31/2019     Occupational Therapy Orders :   No active Occupational Therapy orders.     Dorena, OT, [facility] - 09/01/2019 10:58  EDT   Pain Assessment   Pain Present :   Yes actual or suspected pain   Pain Present :   Other: neck and back pain   Self Report Pain :   Numeric rating scale   Numeric Pain Scale :   7   Numeric Pain Score :   7    Dorena GILLIE [facility] - 09/01/2019 12:35 EDT   Pain Assessment Detail   Interventions Implemented :   Other: trigger point massage, stetching   [place]iven, OT, [place] A - 09/01/2019 12:35 EDT   Therapeutic Activities   OT Therapeutic Activities RTF :   Therapeutic Activities    No qualifying data available     Dorena GILLIE [facility] - 09/01/2019 12:35 EDT   OT Therapeutic Activities Grid     Activity 1  Activity 2  Activity 3  Activity 4    Activities :    Other: LUE grip strengthening and FMC   Other: LUE grip strength and FMC      Transitional movement     Assist :             Modified independence, Distant supervision     Position :  Supported sit   Supported Advertising account planner     Response :    Tolerated well   Tolerated well      Tolerated well     Comments :    Jesse Savage received finishing with prior OT, removing small metal pegs, dowels and washers from [employer] pegboard with tweezers. L hand utilized to challenge [facility] and pinch strength. Jesse Savage tolerates well, with increased time and min drops with managing small items.   To further address L grip strength and FMC deficits, Jesse Savage removes small items from med/firm grade theraputty. Educated to remove items with L hand and Jesse Savage benefits from cueing and reminders to use L vs R hand to remove items. Increased time required, Jesse Savage   tolerates well. Session ends with Jesse Savage creating a moderately complex pegboard design with small pegboard, using L hand to place pegs. Again, increased time is required due to LUE [facility] deficits, however Jesse Savage able to complete the task with no assist.   Jesse Savage ambulates therapy gym back to hospital room (~50 ft) with use of RW with distant supervision/mod I. Left seated in chair with MD present and all needs in reach/met.        Dorena GILLIE [facility] - 09/01/2019 12:35 EDT  Dorena GILLIE [facility] - 09/01/2019 12:35 EDT  Dorena GILLIE [place] [facility] - 09/01/2019 12:35 EDT  Dorena GILLIE, [place] [facility] - 09/01/2019 12:35 EDT      Manual Therapy/Massage   Manual Therapy/Massage Grid     Activity 1  Activity 2        Type :    Mobilization, Myofascial/Soft tissue mobilization   Other: See above           Region :    Left shoulder, Left scapula              Technique :    Trigger point release              Rationale :    Other: address pain and tightness, decrease compensatory movement at scapula due to LUE weakness              Response :    Tolerated well              Comment  (Comment: Trigger point release to L upper trap to address pain. Jesse Savage with noted tightness and edema to this area. Slow/gentle stretch to B scapula into depression and downward rotation. Slow stretch to L shoulder into flexion with scapular depression, with Jesse Savage edu on [place], [place] [facility] - 09/01/2019 12:35 EDT] )  (Comment: preventing compensatory scapular elevation with LUE FMC tasks. Jesse Savage receptive.  Joleen GILLIE [facility] - 09/01/2019 12:35 EDT] )        Dorena GILLIE [place] [facility] - 09/01/2019 12:35 EDT  Dorena GILLIE [place] - 09/01/2019 12:35 EDT        Short Term Goals   Eating Goal Grid     Goal #1          Comment :    [address]                [place], [place] - 09/01/2019 12:35 EDT         Other OT Goal Grid     Goal #1  Goal :    Jesse Savage will stand x5 mins while completing BUE activity c SUP in order to improve standing tolerance for ADL.                      Dorena GILLIE [place] [facility] - 09/01/2019 12:35 EDT         OT ST Goals Reviewed :   Chaney Dorena, OT, [facility] [facility] - 09/01/2019 12:35 EDT   Education   OT Additional Education :   Purpose/benefits of OT interventions.      [place], OT, [facility] [facility] - 09/01/2019 12:35 EDT   Assessment   OT Impairments or Limitations :   Balance deficits, Basic activity of daily living deficits, Coordination deficits, Decreased knowledge of condition, Endurance  deficits, Equipment training, IADL deficits, Mobility deficits, Pain, Proprioception deficits, Sensory deficits, Strength deficits, Visual perceptual deficits   Barriers to Safe Discharge OT :   Past medical history, Severity of deficits   OT Discharge Recommendations :   Home c outpt tx services vs. HH to follow.      [place], OT, [facility] [facility] - 09/01/2019 12:35 EDT   OT Treatment Recommendations :   Jesse Savage tolerates OT session well, with good participation and motivation. Jesse Savage verbose and at times benefits from cues for redirection of attention to task and to complete LUE [facility] activities during conversation. Jesse Savage limited by LUE FMC and grip strength deficits, impaired dynamic standing balance and pain in neck/back region with tightness noted in L upper trap. Jesse Savage will benefit from continued OT in the IPR setting to address deficits listed and to increase independence and safety with ADL's and mobility for d/c home.      Dorena, OT, [place] [facility] - 09/01/2019 12:48 EDT   Time Spent With Patient   OT Time In :   10:30 EST   OT Time Out :   11:30 EST   OT Manual Therapy Units :   1 units   OT Manual Therapy Treatment Time :   15 minutes   OT FUNCTIONAL TRNG 15 MIN :   3 units   OT Functional Activities Minutes :   45 minutes   OT Total Individual Therapy Time Rehab :   60    OT Total Timed Code Treatment Unit Rehab :   4    OT Total Timed Code Treatment Minutes Rehab :   60    OT Total Treatment Time Rehab :   60 minutes   [place], OT, [facility] - 09/01/2019 12:48 EDT   Image 1 -  Images currently included in the form version of this document have not been included in the text rendition version of the form.

## 2019-09-02 LAB — CBC
Hematocrit: 39.4 % (ref 38.0–52.0)
Hemoglobin: 13.2 g/dL (ref 13.0–17.3)
MCH: 29.9 pg (ref 27.0–34.5)
MCHC: 33.5 g/dL (ref 32.0–36.0)
MCV: 89.3 fL (ref 84.0–100.0)
MPV: 9.3 fL (ref 7.2–13.2)
NRBC Absolute: 0 10*3/uL (ref 0.000–0.012)
NRBC Automated: 0 % (ref 0.0–0.2)
Platelets: 250 10*3/uL (ref 140–440)
RBC: 4.41 x10e6/mcL (ref 4.00–5.60)
RDW: 11.9 % (ref 11.0–16.0)
WBC: 8.4 10*3/uL (ref 3.8–10.6)

## 2019-09-02 NOTE — Progress Notes (Signed)
IRF-PAI Quality Indicators Adm -Text       IRF-PAI Quality Indicators Admission Entered On:  09/02/2019 14:08 EDT    Performed On:  09/01/2019 14:08 EDT by Emelda Fear, RN, CHANTEL M               Section K: Swallowing Status   Swallowing Status Admit IRF-PAI :   Regular food   Emelda Fear RN, Milagros Reap - 09/02/2019 14:08 EDT

## 2019-09-02 NOTE — Progress Notes (Signed)
OT Inpatient Daily Documentation - Text       OT Inpatient Daily Documentation Entered On:  09/02/2019 12:34 EDT    Performed On:  09/02/2019 12:17 EDT by KRAFT, OT, MIRA E               Reason for Treatment   *Reason for Referral :   Dx: ACDF C5/6 c osteophytectomy and small durotomy for cord deformity 2t osteophyte, central bulge, stenosis     Prec: Fall  Pt able to unbuckle seat belt, TC day Wed, 3hr tx,, reg diet.    PMH: myelopathy, chronic back pain, OSA, sciatica  Procedure/Surgical Hx:  lumbar fusion L4-5 (04/22/2011)  redo lumbar fusion L5-S1 (2012)  L5-S1 lumbar fusion (2005)  thumb surgery (1995)  facial surgery (1981)  Tonsils and adenoids (1972)  colonoscopy  hernia   tonsillectomy  transforaminal epidural steroid injection (07/08/2019)     KRAFT, OT, MIRA E - 09/02/2019 12:17 EDT   Review/Treatments Provided   OT Goals :   OT Short Term Goals    09/01/2019  Eating Goal #1: short los  Other Goal #1: Pt will stand x5 mins while completing BUE activity c SUP in order to improve standing tolerance for ADL.     OT Plan :   Treatment Frequency: Daily Performed By: Narda Bonds E   08/31/2019  Planned Treatments: Balance training, Basic Activities of Daily Living, Caregiver training, Coordination, Edema management, Education for meticulous skin care and hygiene, Electric modalities, Energy conservation training, Equipment training, Group therapy, HEP, Home mana... Performed By: Narda Bonds E   08/31/2019     Short Term Goals Reviewed :   Yes   Occupational Therapy Orders :   Occupational Therapy Evaluation and Treatment Inpatient Acute - 09/01/19 13:07:00 EDT, Stop date 09/01/19 13:07:00 EDT     Pain Present :   Yes actual or suspected pain   OT Therapeutic Activity,Mobility,Balance :   Yes   OT Therapeutic Exercise :   Yes   OT Manual Therapy Provided :   Yes   KRAFT, [place], MIRA E - 09/02/2019 12:17 EDT   Pain Assessment   Pain Present :   Yes actual or suspected pain   Pain Present :   Other: Upper traps    Self Report Pain :   Numeric rating scale   KRAFT, OT, MIRA E - 09/02/2019 12:17 EDT   Therapeutic Activities   OT Therapeutic Activities RTF :   Therapeutic Activities  Activity 1:  Grasp activities, Pinch activities, Prehension activities; Pt completed perdue pegboard and grooved pegboard activity x>50 reps/manipulatives c ea UE, including use of tweezers at times. Pt completed part of act stance level c distant SUP, up to approx. 3 mins at a time and partially seated.       Performed Date:  09/01/2019  Activity 2:  Other: LUE grip strength and [place]; Supported sit; Tolerated well; To further address L grip strength and FMC deficits, pt removes small items from med/firm grade theraputty. Educated to remove items with L hand and pt benefits from cueing and reminders to use L vs R hand to remove items. Increased time required, pt       Performed Date:  09/01/2019  Activity 3:  tolerates well. Session ends with pt creating a moderately complex pegboard design with small pegboard, using L hand to place pegs. Again, increased time is required due to LUE [facility] deficits, however pt able to complete the task with no assist.  Performed Date:  09/01/2019  Activity 4:  Transitional movement; Mod I, Distant S; Standing; Rolling walker; Tolerated well; Pt ambulates therapy gym back to hospital room (~50 ft) with use of RW with distant supervision/mod I. Left seated in chair with MD present and all needs in reach/met.       Performed Date:  09/01/2019     Reassess Activities of Daily Living :   Yes   KRAFT, OT, MIRA E - 09/02/2019 12:17 EDT   OT Therapeutic Activities Grid     Activity 1          Activities :    Grasp activities, Pinch activities, Prehension activities, Release activities              Comments :    Pt completed variety of FM strengthening and dexterity ex seated tableside in gym c focus on L hand including velcro blocks/board, paperclips and rubberbands on wide mouth jar for digit ext. Issued theraputty and  demo'd ex for weekend use outside of tx.                 KRAFT, [place], MIRA E - 09/02/2019 12:17 EDT         OT Basic ADL   OT Basic ADL 2019   Eating :   Independent - Patient/Resident completes the activities by him/herself, with or without an assistive device, with no assistance from a helper. - 06   Oral Hygiene :   [place] completes the activities by him/herself, with or without an assistive device, with no assistance from a helper. - 06   Toileting Hygiene :   [place] completes the activities by him/herself, with or without an assistive device, with no assistance from a helper. - 06   Toilet Transfer :   Independent - Patient/Resident completes the activities by him/herself, with or without an assistive device, with no assistance from a helper. - 06   Shower, Bathe Self :   Setup or clean-up assistance - Helper sets up or cleans up; Patient/Resident completes activity. Helper assists only prior to or following the activity. - 05   Upper Body Dressing :   Independent - Patient/Resident completes the activities by him/herself, with or without an assistive device, with no assistance from a helper. - 06   Lower Body Dressing :   Independent - Patient/Resident completes the activities by him/herself, with or without an assistive device, with no assistance from a helper. - 06   Putting On, Taking Off Footwear :   Independent - Patient/Resident completes the activities by him/herself, with or without an assistive device, with no assistance from a helper. - 06   Grooming :   Independent - Patient/Resident completes the activities by him/herself, with or without an assistive device, with no assistance from a helper. - 06   Shower Transfer :   Independent - Patient/Resident completes the activities by him/herself, with or without an assistive device, with no assistance from a helper. - 06   KRAFT, OT, MIRA E - 09/02/2019 12:17 EDT   ADL Comments :   Pt completed BADL as above  stance level in shower with exception for bathing B feet for which pt sat on fold out bench. Set up of environment and supplies, pt educated on safe sequencing and demo'd same. Dressed sit<>stand level from chair in bathroom for undergarments and from EOB/recliner in room for remaining clothing. Pt cleared to perform showers.      Limiting Factors :  Motor, Pain, Sensory   OT ADL Reviewed :   Yes   KRAFT, OT, MIRA E - 09/02/2019 12:17 EDT   Therapeutic Exercise   Therapeutic Exercise RTF :   Therapeutic Exercise    No qualifying data available     KRAFT, OT, MIRA E - 09/02/2019 12:17 EDT   OT Therapeutic Exercise Grid     Exercise 1  Exercise 2        Exercise :    Other: scapular and UE strengthenign/postural ex              Comments :    Pt performed BUE GH ER c towel roll between elbow and thorax against green/mod res theraband RUE, yellow/low res on LUE, 3x10 reps c attn to scapular depression to stabilize. Pt further performed scapular retrac/depr against blue/mod-hi res theraband tied   to dowel rod c max cueing initially, then c distant SUP xapprox. 50 reps.             [place], [place], Lenora E - 09/02/2019 12:17 EDT  KRAFT, OT, MIRA E - 09/02/2019 12:17 EDT        Manual Therapy/Massage   Manual Therapy/Massage Grid     Activity 1          Type :    Myofascial/Soft tissue mobilization, Other: Trigger point release              Comment  (Comment: LUE levator scapula, supraspin, upper traps [KRAFT, OT, MIRA E - 09/02/2019 12:17 EDT] )         KRAFT, OT, MIRA E - 09/02/2019 12:17 EDT         Short Term Goals   Eating Goal Grid     Goal #1          Comment :    short los                KRAFT, OT, [place] E - 09/02/2019 12:17 EDT         Other OT Goal Grid     Goal #1  Goal #2        Goal :    Pt will stand x5 mins while completing BUE activity c SUP in order to improve standing tolerance for ADL.         Pt will stand x8 mins while completing BUE activity c SUP in order to improve standing tolerance for ADL.            Status :     Goal met   Revised           Date Met :    09/02/2019 EDT                KRAFT, OT, MIRA E - 09/02/2019 12:17 EDT  KRAFT, OT, MIRA E - 09/02/2019 12:17 EDT        OT ST Goals Reviewed :   Yes   KRAFT, [employer], [place] E - 09/02/2019 12:17 EDT   Education   Responsible Learner Present for Session :   Yes   Barriers To Learning :   None evident   Teaching Method :   Demonstration, Explanation   KRAFT, OT, MIRA E - 09/02/2019 12:17 EDT   Occupational Therapy Education Grid   Activity of Daily Living Training :   [employer] understanding, Scientist, forensic :   Verbalizes understanding, Needs practice/supervision   Home Safety :   Verbalizes understanding  Plan of Care :   Verbalizes understanding, Demonstrates   Spinal Cord Specific Education :   [employer] understanding, Needs practice/supervision   KRAFT, [place], MIRA E - 09/02/2019 12:17 EDT   Assessment   OT Impairments or Limitations :   Balance deficits, Basic activity of daily living deficits, Coordination deficits, Decreased knowledge of condition, Endurance deficits, Equipment training, IADL deficits, Mobility deficits, Pain, Proprioception deficits, Sensory deficits, Strength deficits, Visual perceptual deficits   Barriers to Safe Discharge OT :   Past medical history, Severity of deficits   OT Discharge Recommendations :   Home c outpt tx services vs. HH to follow.      OT Treatment Recommendations :   Pt tolerated well, tx focus on shower ADL with resultant clearance for pt to perform mod I level with set up of environment/supplies. Pt demonstrated good anticipation of needs and sequencing/safety awareness. Additional ed provided on increased risk for bleeding 2t blood thinnner and exerting extra care to prevent falls/following safety protocols, pt verbalizes understanding. Tx focus in gym on postural correction/scapular ex to decrease L upper trap/levator overactivitiy and correct mechanics for any functional use of UE, incl. ed and use of mirror to visualize  and depress L scap. Further on FM strength and coord and brief manual trigger point release to LUE trap region. All in order to decrease pain and improve functional performance and safety. pt will benefit from higher level balance activities, cont postural ex, FM strengthening and high level dexterity tasks over weekend.      KRAFT, OT, MIRA E - 09/02/2019 12:17 EDT   Time Spent With Patient   OT Time In :   9:00 EST   OT Time Out :   10:30 EST   OT Therapeutic Exercise Units :   3 units   OT Therapeutic Exercise Time :   45 minutes   OT Self Care ADL Training Units :   3 units   OT Self Care ADL Training Time :   45 minutes   OT Total Individual Therapy Time Rehab :   90    OT Total Timed Code Treatment Unit Rehab :   6    OT Total Timed Code Treatment Minutes Rehab :   90    OT Total Treatment Time Rehab :   90 minutes   KRAFT, OT, MIRA E - 09/02/2019 12:17 EDT   Image 1 -  Images currently included in the form version of this document have not been included in the text rendition version of the form.

## 2019-09-02 NOTE — Progress Notes (Signed)
 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 with cord deformity 2* osteophyte, central bulge, stenosis , prior history of multiple lumbar fusion surgeries and revisions with continued back pain    3HRS/day     Hegeman, PT, [name] - 09/02/2019 10:24 EDT   Review/Treatments Provided   PT Goals :   PT Short Term Goals    08/31/2019  Bed Mobility Goal #1: See LTG 2* ELOS     PT Plan :   Treatment Frequency:  Daily (modified)   Performed By: [name], PT, [name]  08/31/2019 13:00  Treatment Duration: 1 Performed By: [name], PT, [name]  08/31/2019 13:00  Planned Treatments: Balance training, Basic activities of daily living, Bed mobility training, Body weight support treadmill training, Community/Work reintegration, Electrical stimulation, Equipment training, Functional training, Gait training, Group therapy, Manual thera... Performed By: [name], PT, [name]  08/31/2019 13:00     Short Term Goals Reviewed :   Yes   Physical Therapy Orders :   Physical Therapy Inpatient Additional Treatment Rehab - 08/31/19 21:43:21 EDT, Balance training, Basic activities of daily living, Bed mobility training, Body weight support treadmill training, Community/Work reintegration, Electrical stimulation, Equipment training, Functional training, Gait training, Gro...     Pain Present :   Yes actual or suspected pain   PT Therapeutic Activity,Mobility,Balance :   Yes   PT Therapeutic Exercise :   Yes   Hegeman, PT, [name] - 09/02/2019 10:24 EDT   Pain Assessment   Pain Present :   Yes actual or suspected pain   Pain Present :   Back   Self Report Pain :   Numeric rating scale   Hegeman, PT, [name] - 09/02/2019 10:24 EDT   Therapeutic Activities/Mobility/Balance   Functional  Activity :   Therapeutic Activities    No qualifying data available     Hegeman, PT, [name] - 09/02/2019 10:24 EDT   PT Therapeutic Activities Grid     Activity 1  Activity 2  Activity 3      Activity :    Gait   Balance           Assist :    Setup   Minimal assistance           Response :    Demonstrated positive response to treatment, Improved stability in muscle group(s), Improved timing, control, and coordination, Increased activation of targeted muscle group(s)   Demonstrated positive response to treatment, Improved stability in muscle group(s), Improved timing, control, and coordination, Increased activation of targeted muscle group(s)           Comment :    Pt ambulating with RW x 300' independently as warm up to therapy session. Gait training with SBQC 2 x 150' with VC for proper use and placement of can as well as increased step length and step through gait pattern.    static standing on Airex foam with wide BOS, narrow BOS, eyes open, eyes closed, progressing to balloon volley with eyes open 2 x 2 min each condition. Progressed to A/P and lateral rockerboard then rockerboard with balloon volley to improve dynamic (conT   (  cont) balance with functional mobility and gait.           Hegeman, PT, [name] - 09/02/2019 10:24 EDT  [name], PT, [name] - 09/02/2019 10:24 EDT  [name], PT, [name] - 09/02/2019 10:24 EDT       Reassess Mobility :   Yes   Hegeman, PT, [name] - 09/02/2019 10:24 EDT   PT Mobility   PT Mobility 2019   Sit to Stand :   Independent - Patient/Resident completes the activities by him/herself, with or without an assistive device, with no assistance from a helper. - 06   Stand to Sit :   Independent - Patient/Resident completes the activities by him/herself, with or without an assistive device, with no assistance from a helper. - 06   Chair, Bed to Chair Transfer :   Independent - Patient/Resident completes the activities by him/herself, with or without an assistive device, with no assistance from a  helper. - 06   Hegeman, PT, [name] - 09/02/2019 10:24 EDT   Independent - Patient/Resident completes the activities by him/herself, with or without an assistive device, with no assistance from a helper. - 06 :   .SABRA   Hegeman, PT, [name] - 09/02/2019 10:24 EDT   PT Ambulation 2019   Walk 10 Feet :   Supervision or touching assistance - Helper provides verbal cues and/or touching/steadying and/or contact guard assistance as patient/resident completes activity. Assistance may be provided throughout the activity or intermittently. - 04   Walk 50 Feet with Two Turns :   Supervision or touching assistance - Helper provides verbal cues and/or touching/steadying and/or contact guard assistance as patient/resident completes activity. Assistance may be provided throughout the activity or intermittently. - 04   Walk 150 Feet :   Supervision or touching assistance - Helper provides verbal cues and/or touching/steadying and/or contact guard assistance as patient/resident completes activity. Assistance may be provided throughout the activity or intermittently. - 04   Hegeman, PT, [name] - 09/02/2019 10:24 EDT   Transfer Type :   Stand step   Ambulation Device Utilized :   Small based quad cane   Distance Level Surface :   150 ft   PT Mobility Reviewed :   Yes   Hegeman, PT, [name] - 09/02/2019 10:24 EDT   Therapeutic Exercise   Therapeutic Exercise RTF :   Therapeutic Exercise    No qualifying data available     Hegeman, PT, [name] - 09/02/2019 10:24 EDT   Therapeutic Exercise Grid     Exercise 1          Exercise :    Lower extremity strengthening              Repetition/Time :    3x10              Comment :    fwd and lateral step ups on 6 step with unilat UE support, standing SL heel raises and quadriped hip ext for improved LE stenght with functional gait activity.                 Hegeman, PT, [name] - 09/02/2019 10:24 EDT         Short Term Goals   Bed Mobility Goal Grid     Goal #1          Comments :    See LTG 2* ELOS                 Hegeman, PT, [name] - 09/02/2019 10:24  EDT         PT ST Goals Reviewed :   Yes   Hegeman, PT, [name] - 09/02/2019 10:24 EDT   Assessment   PT Impairments or Limitations :   Abnormal tone, Ambulation deficits, Balance deficits, Bed mobility deficits, Decreased knowledge of condition, Endurance deficits, Equipment training, Impaired sensation, Pain limiting function, Range of motion deficits, Strength deficits, Transfer deficits   Discharge Recommendations :   ELOS: 5-7 days to progress to PLOF without AD; home with wife-Rec OPPT     PT Treatment Recommendations :   Pt demonstrates good response to functional strenghtening exercises today with increased difficulty on L LE due to more significant weakness. He was able to achieve muscle fatique in 8-10 reps indicating good resistance and intensity of exercise. He was able to progress to gait with [facility] and will likely progress to [facility] in next tx session due to impaired use of quad cane. He will cont to benefit from skilled PT to address above impairments and progress indep and quality of gait with LRAD prior to DC home next week.      Hegeman, PT, [name] - 09/02/2019 10:24 EDT   Time Spent With Patient   PT Time In :   13:00 EST   PT Time Out :   14:30 EST   PT Therapeutic Exercise Time :   0 minutes   PT Total Individual Time Rehab :   0    PT Total Timed Code Treatment Minutes Rehab :   0    PT Total Treatment Time Rehab :   0 minutes   Hegeman, PT, [name] - 09/02/2019 10:24 EDT

## 2019-09-02 NOTE — Progress Notes (Signed)
 PT Inpatient Daily Documentation - Text       PT Inpatient Daily Documentation Entered On:  09/02/2019 12:33 EDT    Performed On:  09/02/2019 10:30 EDT by [name], PT, [name]               Reason for Treatment   *Reason for Referral :   Jesse Savage  DX:  cervical myelopathy due to ACDF C5/6 costeophytectomy and small durotomy with cord deformity 2* osteophyte, central bulge, stenosis , prior history of multiple lumbar fusion surgeries and revisions with continued back pain    3HRS/day     Hegeman, PT, [name] - 09/02/2019 12:23 EDT   Review/Treatments Provided   PT Goals :   PT Short Term Goals    09/01/2019  Bed Mobility Goal #1: See LTG 2* ELOS     PT Plan :   Treatment Frequency:  Daily (modified)   Performed By: [name], PT, [name]  08/31/2019 13:00  Treatment Duration: 1 Performed By: [name], PT, [name]  08/31/2019 13:00  Planned Treatments: Balance training, Basic activities of daily living, Bed mobility training, Body weight support treadmill training, Community/Work reintegration, Electrical stimulation, Equipment training, Functional training, Gait training, Group therapy, Manual thera... Performed By: [name], PT, [name]  08/31/2019 13:00     Short Term Goals Reviewed :   Yes   Physical Therapy Orders :   Physical Therapy Inpatient Additional Treatment Rehab - 08/31/19 21:43:21 EDT, Balance training, Basic activities of daily living, Bed mobility training, Body weight support treadmill training, Community/Work reintegration, Electrical stimulation, Equipment training, Functional training, Gait training, Gro...     Pain Present :   Yes actual or suspected pain   PT Therapeutic Activity,Mobility,Balance :   Yes   PT Therapeutic Exercise :   Yes   Hegeman, PT, [name] - 09/02/2019 12:23 EDT   Pain Assessment   Pain Present :   Yes actual or suspected pain   Pain Present :   Back   Self Report Pain :   Numeric rating scale   Hegeman, PT, [name] - 09/02/2019 12:23 EDT   Therapeutic Activities/Mobility/Balance   Functional  Activity :   Therapeutic Activities  Activity 1:  Gait; Setup; Demonstrated positive response to treatment, Improved stability in muscle group(s), Improved timing, control, and coordination, Increased activation of targeted muscle group(s); Pt ambulating with RW x 300' independently as warm up to therapy session. Gait training with SBQC 2 x 150' with VC for proper use and placement of can as well as increased step length and step through gait pattern.       Performed Date:  09/01/2019  Activity 2:  Balance; Minimal assistance; Demonstrated positive response to treatment, Improved stability in muscle group(s), Improved timing, control, and coordination, Increased activation of targeted muscle group(s); static standing on Airex foam with wide BOS, narrow BOS, eyes open, eyes closed, progressing to balloon volley with eyes open 2 x 2 min each condition. Progressed to A/P and lateral rockerboard then rockerboard with balloon volley to improve dynamic (conT       Performed Date:  09/01/2019  Activity 3:  (cont) balance with functional mobility and gait.       Performed Date:  09/01/2019     [name] ALMETA Gauze - 09/02/2019 12:23 EDT   PT Therapeutic Activities Grid     Activity 1          Activity :    Gait              Assist :  Setup              Response :    Demonstrated positive response to treatment, Improved stability in muscle group(s), Improved timing, control, and coordination, Increased activation of targeted muscle group(s)              Comment :    gait over level and unlevel surfaces outdoors including brick pathways, ramps, grass, sidewalks with SPC and distant SPV for safety, occ VC for technique with curb negotiation. >2000' with occ seated rest breaks due to L LE fatique. x 40 min total                Hegeman, PT, [name] - 09/02/2019 12:23 EDT         Reassess Mobility :   Yes   Hegeman, PT, [name] - 09/02/2019 12:23 EDT   PT Mobility   PT Mobility 2019   Sit to Stand :   Independent - Patient/Resident  completes the activities by him/herself, with or without an assistive device, with no assistance from a helper. - 06   Stand to Sit :   Independent - Patient/Resident completes the activities by him/herself, with or without an assistive device, with no assistance from a helper. - 06   Hegeman, PT, [name] - 09/02/2019 12:23 EDT   Independent - Patient/Resident completes the activities by him/herself, with or without an assistive device, with no assistance from a helper. - 06 :   .SABRA   Hegeman, PT, [name] - 09/02/2019 12:23 EDT   PT Ambulation 2019   Walk 10 Feet :   Independent - Patient/Resident completes the activities by him/herself, with or without an assistive device, with no assistance from a helper. - 06   Walk 50 Feet with Two Turns :   Independent - Patient/Resident completes the activities by him/herself, with or without an assistive device, with no assistance from a helper. - 06   Walk 150 Feet :   Independent - Patient/Resident completes the activities by him/herself, with or without an assistive device, with no assistance from a helper. - 06   Walking 10 Feet Uneven Surfaces :   Supervision or touching assistance - Helper provides verbal cues and/or touching/steadying and/or contact guard assistance as patient/resident completes activity. Assistance may be provided throughout the activity or intermittently. - 04   1 Step (Curb) :   Supervision or touching assistance - Helper provides verbal cues and/or touching/steadying and/or contact guard assistance as patient/resident completes activity. Assistance may be provided throughout the activity or intermittently. - 04   Ramp Ambulation :   Supervision or touching assistance - Helper provides verbal cues and/or touching/steadying and/or contact guard assistance as patient/resident completes activity. Assistance may be provided throughout the activity or intermittently. - 04   4 Steps :   Supervision or touching assistance - Helper provides verbal cues and/or  touching/steadying and/or contact guard assistance as patient/resident completes activity. Assistance may be provided throughout the activity or intermittently. - 04   12 Steps :   Supervision or touching assistance - Helper provides verbal cues and/or touching/steadying and/or contact guard assistance as patient/resident completes activity. Assistance may be provided throughout the activity or intermittently. - 04   Hegeman, PT, [name] - 09/02/2019 12:23 EDT   Transfer Type :   Stand step   Ambulation Device Utilized :   Small based quad cane   Distance Level Surface :   1,000 ft   Number of Stairs :   12  PT Mobility Reviewed :   Yes   Hegeman, PT, [name] - 09/02/2019 12:23 EDT   Therapeutic Exercise   Therapeutic Exercise RTF :   Therapeutic Exercise  Exercise 1:  Lower extremity strengthening; 3x10; fwd and lateral step ups on 6 step with unilat UE support, standing SL heel raises and quadriped hip ext for improved LE stenght with functional gait activity.       Performed Date:  09/01/2019     [name] KLEIN, [name] - 09/02/2019 12:23 EDT   Therapeutic Exercise Grid     Exercise 2          Exercise :    Lower extremity strengthening              Repetition/Time :    3x10              Comment :    standing mini squats with B UE support for balance, lateral stepping with blue T-band at hips and quadriped hip ext for improved LE strength with functional gait activity                 Hegeman, PT, [name] - 09/02/2019 12:23 EDT         Short Term Goals   Bed Mobility Goal Grid     Goal #1          Comments :    See LTG 2* ELOS                Hegeman, PT, [name] - 09/02/2019 12:23 EDT         PT ST Goals Reviewed :   Chaney [name], PT, [name] - 09/02/2019 12:23 EDT   Assessment   PT Impairments or Limitations :   Abnormal tone, Ambulation deficits, Balance deficits, Bed mobility deficits, Decreased knowledge of condition, Endurance deficits, Equipment training, Impaired sensation, Pain limiting function, Range of motion deficits,  Strength deficits, Transfer deficits   Discharge Recommendations :   ELOS: 5-7 days to progress to PLOF without AD; home with wife-Rec OPPT     PT Treatment Recommendations :   Pt demonstrates good progression onto [facility] for gait today, good carryover of technique from [facility] and improved mechanics, gait speed and safety. He was able to ambulate on unlevel surfaces outdoors without LOB, able to self regulate need for rest breaks due to endurance deficits. He will cont to benefit from skilled PT while IP to address higher level balance activity, L LE strength and improve gait mechanics. Plan to assess floor recovery next tx session.      Hegeman, PT, [name] - 09/02/2019 12:23 EDT   Time Spent With Patient   PT Time In :   10:30 EST   PT Time Out :   12:00 EST   PT Therapeutic Exercise Units :   4 units   PT Therapeutic Exercise Time :   60 minutes   PT Gait Training Units :   2 units   PT Gait Training Time :   30 minutes   PT Total Individual Time Rehab :   90    PT Total Timed Code Treatment Unit Rehab :   6    PT Total Timed Code Treatment Minutes Rehab :   90    PT Total Treatment Time Rehab :   90 minutes   Hegeman, PT, [name] - 09/02/2019 12:23 EDT

## 2019-09-03 LAB — CREATININE
Creatinine: 0.7 mg/dL (ref 0.7–1.3)
GFR African American: 120 mL/min/{1.73_m2} (ref >=90)
GFR Non-African American: 103 mL/min/{1.73_m2} (ref >=90)

## 2019-09-03 LAB — CBC
Hematocrit: 39 % (ref 38.0–52.0)
Hemoglobin: 13.3 g/dL (ref 13.0–17.3)
MCH: 30.5 pg (ref 27.0–34.5)
MCHC: 34.1 g/dL (ref 32.0–36.0)
MCV: 89.4 fL (ref 84.0–100.0)
MPV: 9.2 fL (ref 7.2–13.2)
NRBC Absolute: 0 10*3/uL (ref 0.000–0.012)
NRBC Automated: 0 % (ref 0.0–0.2)
Platelets: 247 10*3/uL (ref 140–440)
RBC: 4.36 x10e6/mcL (ref 4.00–5.60)
RDW: 12 % (ref 11.0–16.0)
WBC: 6.3 10*3/uL (ref 3.8–10.6)

## 2019-09-03 NOTE — Progress Notes (Signed)
 PT Inpatient Daily Documentation - Text       PT Inpatient Daily Documentation Entered On:  09/03/2019 7:48 EDT    Performed On:  09/03/2019 8:00 EDT by Albertina, PT, Josette RAMAN               Reason for Treatment   Subjective Statement :   Pt reporting he has not recieved his pain meds yet, RN alerted.      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, central bulge, stenosis , prior history of multiple lumbar fusion surgeries and revisions with continued back pain    3HRS/day     Marggraff, PT, Josette RAMAN - 09/03/2019 9:01 EDT   Review/Treatments Provided   PT Therapeutic Exercise :   Yes   Marggraff, PT, Josette RAMAN - 09/03/2019 9:03 EDT   PT Goals :   PT Short Term Goals    09/02/2019  Bed Mobility Goal #1: See LTG 2* ELOS     PT Plan :   Treatment Frequency:  Daily (modified)   Performed By: [name], PT, Amber  08/31/2019 13:00  Treatment Duration: 1 Performed By: [name], PT, Amber  08/31/2019 13:00  Planned Treatments: Balance training, Basic activities of daily living, Bed mobility training, Body weight support treadmill training, Community/Work reintegration, Electrical stimulation, Equipment training, Functional training, Gait training, Group therapy, Manual thera... Performed By: [name], PT, Amber  08/31/2019 13:00     Short Term Goals Reviewed :   Yes   Physical Therapy Orders :   Physical Therapy Inpatient Additional Treatment Rehab - 08/31/19 21:43:21 EDT, Balance training, Basic activities of daily living, Bed mobility training, Body weight support treadmill training, Community/Work reintegration, Electrical stimulation, Equipment training, Functional training, Gait training, Gro...     Pain Present :   Yes actual or suspected pain   PT Therapeutic Activity,Mobility,Balance :   Yes   Marggraff, PT, Josette RAMAN -  09/03/2019 8:57 EDT   Pain Assessment   Pain Present :   Yes actual or suspected pain   Pain Present :   Other: low back, L calf and neck   Self Report Pain :   Numeric rating scale   Marggraff, PT, Josette RAMAN - 09/03/2019 8:57 EDT   Therapeutic Activities/Mobility/Balance   Functional Activity :   Therapeutic Activities  Activity 1:  Gait; Setup; Demonstrated positive response to treatment, Improved stability in muscle group(s), Improved timing, control, and coordination, Increased activation of targeted muscle group(s); gait over level and unlevel surfaces outdoors including brick pathways, ramps, grass, sidewalks with SPC and distant SPV for safety, occ VC for technique with curb negotiation. >2000' with occ seated rest breaks due to L LE fatique. x 40 min total       Performed Date:  09/02/2019  Activity 2:  Balance; Minimal assistance; Demonstrated positive response to treatment, Improved stability in muscle group(s), Improved timing, control, and coordination, Increased activation of targeted muscle group(s); static standing on Airex foam with wide BOS, narrow BOS, eyes open, eyes closed, progressing to balloon volley with eyes open 2 x 2 min each condition. Progressed to A/P and lateral rockerboard then rockerboard with balloon volley to improve dynamic (conT       Performed Date:  09/01/2019  Activity 3:  (  cont) balance with functional mobility and gait.       Performed Date:  09/01/2019     Albertina ALMETA Josette GORMAN - 09/03/2019 8:57 EDT   Marggraff, PT, Josette GORMAN - 09/03/2019 8:57 EDT   PT Therapeutic Activities Grid     Activity 3  Activity 1  Activity 2      Activity :    Gait   Transfer training, Transitional movement   Gait        Assist :    Modified independence, Distant supervision   Modified independence   Modified independence        Comment :    pt amb >1000 outside, along uneven surfaces (brick, grass), pt negotiated curb steps and requiring distant spv>modI; use of SPC t/o   pt manuevering around  room, donning pants c SPC and no epsiodes of LOB, pt at modI level in room   pt amb 600' (x3) in AM session, c SPC and no episodes of LOB, cuing for erect posture t/o to decr incr UT activation          Marggraff, PT, Josette GORMAN - 09/03/2019 14:34 EDT  Marggraff, PT, Josette GORMAN - 09/03/2019 10:50 EDT  Marggraff, PT, Josette GORMAN - 09/03/2019 10:50 EDT       Reassess Mobility :   Yes   Marggraff, PT, Josette GORMAN - 09/03/2019 8:57 EDT   PT Mobility   Distance Uneven Surface :   1,000 ft   Marggraff, PT, Josette GORMAN - 09/05/2019 7:48 EDT   Marggraff, PT, Josette GORMAN - 09/05/2019 7:48 EDT   PT Mobility 2019   Sit to Stand :   Independent - Patient/Resident completes the activities by him/herself, with or without an assistive device, with no assistance from a helper. - 06   Stand to Sit :   Independent - Patient/Resident completes the activities by him/herself, with or without an assistive device, with no assistance from a helper. - 06   Chair, Bed to Chair Transfer :   Independent - Patient/Resident completes the activities by him/herself, with or without an assistive device, with no assistance from a helper. - 06   Marggraff, PT, Josette GORMAN - 09/03/2019 8:57 EDT   Independent - Patient/Resident completes the activities by him/herself, with or without an assistive device, with no assistance from a helper. - 06 :   .SABRA Albertina, PT, Josette GORMAN - 09/03/2019 8:57 EDT   Marggraff, PT, Josette GORMAN - 09/03/2019 8:57 EDT   PT Ambulation 2019   Walking 10 Feet Uneven Surfaces :   Supervision or touching assistance - Helper provides verbal cues and/or touching/steadying and/or contact guard assistance as patient/resident completes activity. Assistance may be provided throughout the activity or intermittently. - 04   1 Step (Curb) :   Supervision or touching assistance - Helper provides verbal cues and/or touching/steadying and/or contact guard assistance as patient/resident completes activity. Assistance may be provided throughout the activity or  intermittently. - 04   Marggraff, PT, Josette GORMAN - 09/05/2019 7:48 EDT   Walk 10 Feet :   Independent - Patient/Resident completes the activities by him/herself, with or without an assistive device, with no assistance from a helper. - 06   Walk 50 Feet with Two Turns :   Independent - Patient/Resident completes the activities by him/herself, with or without an assistive device, with no assistance from a helper. - 06   Walk 150 Feet :   Independent - Patient/Resident completes the activities by  him/herself, with or without an assistive device, with no assistance from a helper. - 06   Transfer Type :   Stand step   Ambulation Device Utilized :   Small based quad cane   Distance Level Surface :   600 ft   PT Mobility Reviewed :   Yes   Marggraff, PT, Josette RAMAN - 09/03/2019 8:57 EDT   Therapeutic Exercise   Therapeutic Exercise RTF :   Therapeutic Exercise  Exercise 2:  Lower extremity strengthening; 3x10; standing mini squats with B UE support for balance, lateral stepping with blue T-band at hips and quadriped hip ext for improved LE strength with functional gait activity       Performed Date:  09/02/2019  Exercise 1:  Lower extremity strengthening; 3x10; fwd and lateral step ups on 6 step with unilat UE support, standing SL heel raises and quadriped hip ext for improved LE stenght with functional gait activity.       Performed Date:  09/01/2019     Albertina ALMETA Josette RAMAN - 09/03/2019 9:03 EDT   Therapeutic Exercise Grid     Exercise 1          Exercise :    Lower extremity strengthening              Position :    Supported stand, Unsupported sit              Repetition/Time :    x 10              Comment :    step ups (x10 on ea LE on 6 step); side stepping c blue TB to improve abd strength; sit to stands from low surface c blue TB to improve LE strength for transfers/amb                Marggraff, PT, Josette RAMAN - 09/03/2019 9:03 EDT         Short Term Goals   Bed Mobility Goal Grid     Goal #1          Comments :     See LTG 2* ELOS                Marggraff, PT, Josette RAMAN - 09/03/2019 8:57 EDT         PT ST Goals Reviewed :   Chaney Albertina, PT, Josette RAMAN - 09/03/2019 8:57 EDT   Education   Physical Therapy Education Grid   Ambulation with [place] :   [place] understanding, Demonstrates   Marggraff, PT, Josette RAMAN - 09/03/2019 8:57 EDT   Assessment   PT Impairments or Limitations :   Abnormal tone, Ambulation deficits, Balance deficits, Bed mobility deficits, Decreased knowledge of condition, Endurance deficits, Equipment training, Impaired sensation, Pain limiting function, Range of motion deficits, Strength deficits, Transfer deficits   Discharge Recommendations :   ELOS: 5-7 days to progress to PLOF without AD; home with wife-Rec OPPT     Marggraff, PT, Josette RAMAN - 09/03/2019 9:01 EDT   PT Treatment Recommendations :   AM session, pt reporting some pain in low back, neck and L calf. RN present as PT began to provide pt with pain medicine. Pt tolerating tasks selected t/o AM session c no episodes of LOB. Pt cont to show improvement in gait mechanics with SPC at this time. PM session, pt requested to go outside. pt able to amb >1000' c no LOB, negotiating curb steps and along uneven  surfaces c SPC. Pt will cont to benefit from skilled inpatient therapy to improve functional mobility.      Marggraff, PT, Josette RAMAN - 09/03/2019 14:34 EDT   Time Spent With Patient   PT Time In 2 :   14:00 EST   PT Time Out 2 :   14:30 EST   Marggraff, PT, Josette RAMAN - 09/03/2019 14:34 EDT   PT Therapeutic Exercise Units :   2 units   PT Therapeutic Exercise Time :   30 minutes   Marggraff, PT, Kristen S - 09/03/2019 9:03 EDT   PT Gait Training Units :   4 units   PT Gait Training Time :   60 minutes   PT Total Individual Time Rehab :   90    PT Total Timed Code Treatment Unit Rehab :   6    PT Total Timed Code Treatment Minutes Rehab :   90    PT Total Treatment Time Rehab :   90 minutes   Marggraff, PT, Kristen S - 09/03/2019 14:34 EDT   PT Time In :    8:00 EST   PT Time Out :   9:00 EST   Marggraff, PT, Josette RAMAN - 09/03/2019 8:57 EDT   Additional Information   Additional Information PT :   Pt was left in room, modI in room, SPC and needs in reach following Am and PM sessions.      Marggraff, PT, Josette RAMAN - 09/03/2019 14:34 EDT

## 2019-09-04 LAB — CBC
Hematocrit: 43.5 % (ref 38.0–52.0)
Hemoglobin: 14 g/dL (ref 13.0–17.3)
MCH: 29.5 pg (ref 27.0–34.5)
MCHC: 32.2 g/dL (ref 32.0–36.0)
MCV: 91.6 fL (ref 84.0–100.0)
MPV: 9.5 fL (ref 7.2–13.2)
NRBC Absolute: 0 10*3/uL (ref 0.000–0.012)
NRBC Automated: 0 % (ref 0.0–0.2)
Platelets: 261 10*3/uL (ref 140–440)
RBC: 4.75 x10e6/mcL (ref 4.00–5.60)
RDW: 11.8 % (ref 11.0–16.0)
WBC: 8.4 10*3/uL (ref 3.8–10.6)

## 2019-09-04 NOTE — Progress Notes (Signed)
 PT Inpatient Daily Documentation - Text       PT Inpatient Daily Documentation Entered On:  09/04/2019 15:04 EDT    Performed On:  09/04/2019 14:30 EDT by SHONA, PT, CAROLYN HERO               Reason for Treatment   Subjective Statement :   pt agreeable for extra tx today; returned to room at mod I level after session.      *Reason for Referral :   Bobby  DX:  cervical myelopathy due to ACDF C5/6 costeophytectomy and small durotomy with cord deformity 2* osteophyte, central bulge, stenosis , prior history of multiple lumbar fusion surgeries and revisions with continued back pain    3HRS/day     *Chief Complaint :   neck, L calf and low back     [place], PT, CAROLYN HERO - 09/04/2019 14:59 EDT   Review/Treatments Provided   PT Goals :   PT Short Term Goals    09/03/2019  Bed Mobility Goal #1: See LTG 2* ELOS     PT Plan :   Treatment Frequency:  Daily (modified)   Performed By: [name], PT, Amber  08/31/2019 13:00  Treatment Duration: 1 Performed By: [name], PT, Amber  08/31/2019 13:00  Planned Treatments: Balance training, Basic activities of daily living, Bed mobility training, Body weight support treadmill training, Community/Work reintegration, Electrical stimulation, Equipment training, Functional training, Gait training, Group therapy, Manual thera... Performed By: [name], PT, Amber  08/31/2019 13:00     Short Term Goals Reviewed :   Yes   Physical Therapy Orders :   Physical Therapy Inpatient Additional Treatment Rehab - 08/31/19 21:43:21 EDT, Balance training, Basic activities of daily living, Bed mobility training, Body weight support treadmill training, Community/Work reintegration, Electrical stimulation, Equipment training, Functional training, Gait training, Gro...     Pain Present :   Yes actual or suspected pain   PT Therapeutic Activity,Mobility,Balance :   Yes   PT Therapeutic Exercise :   Yes   HALL, PT, KAITLYN M - 09/04/2019 14:59 EDT   Pain Assessment   Pain Present :   Yes actual or suspected pain   Pain  Present :   Back   Self Report Pain :   Numeric rating scale   HALL, PT, KAITLYN M - 09/04/2019 14:59 EDT   Therapeutic Activities/Mobility/Balance   Functional Activity :   Therapeutic Activities  Activity 1:  Transfer training, Transitional movement; Mod I; pt manuevering around room, donning pants c SPC and no epsiodes of LOB, pt at modI level in room       Performed Date:  09/03/2019  Activity 2:  Gait; Mod I; pt amb 600' (x3) in AM session, c SPC and no episodes of LOB, cuing for erect posture t/o to decr incr UT activation       Performed Date:  09/03/2019  Activity 3:  Gait; Mod I, Distant S; pt amb >1000 outside, along uneven surfaces (brick, grass), pt negotiated curb steps and requiring distant spv>modI; use of [facility] t/o       Performed Date:  09/03/2019     HALL, PT, KAITLYN M - 09/04/2019 14:59 EDT   PT Therapeutic Activities Grid     Activity 1          Activity :    Gait              Assist :    Modified independence  Comment :    mod I c SPC to/from therapy gym                [place], [place], CAROLYN HERO - 09/04/2019 14:59 EDT         Reassess Mobility :   Yes   HALL, PT, CAROLYN HERO - 09/04/2019 14:59 EDT   PT Mobility   PT Mobility 2019   Lying to Sitting on Side of Bed :   Independent - Patient/Resident completes the activities by him/herself, with or without an assistive device, with no assistance from a helper. - 06   Scooting :   Independent - Patient/Resident completes the activities by him/herself, with or without an assistive device, with no assistance from a helper. - 06   Sit to Stand :   Independent - Patient/Resident completes the activities by him/herself, with or without an assistive device, with no assistance from a helper. - 06   Stand to Sit :   Independent - Patient/Resident completes the activities by him/herself, with or without an assistive device, with no assistance from a helper. - 06   Chair, Bed to Chair Transfer :   Independent - Patient/Resident completes the activities by  him/herself, with or without an assistive device, with no assistance from a helper. - 06   HALL, PT, KAITLYN M - 09/04/2019 14:59 EDT   Independent - Patient/Resident completes the activities by him/herself, with or without an assistive device, with no assistance from a helper. - 06 :   ..   HALL, PT, CAROLYN HERO - 09/04/2019 14:59 EDT   PT Ambulation 2019   Walk 10 Feet :   Independent - Patient/Resident completes the activities by him/herself, with or without an assistive device, with no assistance from a helper. - 06   Walk 50 Feet with Two Turns :   Independent - Patient/Resident completes the activities by him/herself, with or without an assistive device, with no assistance from a helper. - 06   HALL, PT, CAROLYN HERO - 09/04/2019 14:59 EDT   Transfer Type :   Stand step   Ambulation Device Utilized :   Straight cane   PT Mobility Reviewed :   Chaney HURST, PT, KAITLYN M - 09/04/2019 14:59 EDT   Therapeutic Exercise   Therapeutic Exercise RTF :   Therapeutic Exercise  Exercise 1:  Lower extremity strengthening; Supported stand, Unsupported sit; x 10; step ups (x10 on ea LE on 6 step); side stepping c blue TB to improve abd strength; sit to stands from low surface c blue TB to improve LE strength for transfers/amb       Performed Date:  09/03/2019  Exercise 2:  Lower extremity strengthening; 3x10; standing mini squats with B UE support for balance, lateral stepping with blue T-band at hips and quadriped hip ext for improved LE strength with functional gait activity       Performed Date:  09/02/2019     HALL, PT, KAITLYN M - 09/04/2019 14:59 EDT   Therapeutic Exercise Grid     Exercise 1  Exercise 2        Exercise :    Lower extremity strengthening   Fine motor control           Position :    Supported stand   Unsupported stand           Repetition/Time :    20 reps  Resist or Assist :    2# ankle wts              Comment :    B LE ther ex performed in //bars for inc strength: marches (painful and  discontinued), hip abd, hip ext HS curls, heel raises.    pt stood on foam while performing resisted clothes pin task for inc L UE and fine motor strength. spv for balance, inc difficulty c clothes pins c more resistance. 1.5# wt to L wrist when removing clothes pins back to bin.              [place], PT, KAITLYN M - 09/04/2019 14:59 EDT  SHONA, PT, KAITLYN M - 09/04/2019 14:59 EDT        Short Term Goals   Bed Mobility Goal Grid     Goal #1          Comments :    See LTG 2* ELOS                [place], PT, CAROLYN HERO - 09/04/2019 14:59 EDT         PT ST Goals Reviewed :   Chaney SHONA, PT, CAROLYN HERO - 09/04/2019 14:59 EDT   Education   Responsible Learner Present for Session :   Yes   Barriers to Learning :   None evident   Teaching Method :   Demonstration, Explanation   [place], PT, CAROLYN HERO - 09/04/2019 14:59 EDT   Physical Therapy Education Grid   Ambulation :   TEFL teacher understanding, Demonstrates   Bed to Chair Transfers :   TEFL teacher understanding, Paediatric nurse   Exercise Program :   TEFL teacher understanding, Demonstrates   Home Safety :   TEFL teacher understanding   Physical Therapy Plan of Care :   [employer] understanding   [place], PT, CAROLYN HERO - 09/04/2019 14:59 EDT   Assessment   PT Impairments or Limitations :   Abnormal tone, Ambulation deficits, Balance deficits, Endurance deficits, Equipment training, Impaired sensation, Pain limiting function, Range of motion deficits, Strength deficits   Discharge Recommendations :   ELOS: 5-7 days to progress to PLOF without AD; home with wife-Rec OPPT     PT Treatment Recommendations :   pt doing well from a physical standpoint; did demo some LBP and neck pain c standing exercises, notably marching, which were discontinued. pt requesting to work on L hand strength and able to do so in unsupported stand on foam to challenge balance. cont skilled PT POC.      [place], PT, CAROLYN HERO - 09/04/2019 14:59 EDT   Time Spent With Patient   PT Time In :   14:30 EST   PT Time Out :   15:00 EST    PT Therapeutic Exercise Units :   2 units   PT Therapeutic Exercise Time :   30 minutes   PT Total Individual Time Rehab :   30    PT Total Timed Code Treatment Unit Rehab :   2    PT Total Timed Code Treatment Minutes Rehab :   30    PT Total Treatment Time Rehab :   30 minutes   HALL, PT, KAITLYN M - 09/04/2019 14:59 EDT

## 2019-09-04 NOTE — Progress Notes (Signed)
OT Inpatient Daily Documentation - Text       OT Inpatient Daily Documentation Entered On:  09/03/2019 10:25 EDT    Performed On:  09/03/2019 10:19 EDT by CASTELLOW, OT, PAIGE A               Reason for Treatment   *Reason for Referral :   Dx: ACDF C5/6 c osteophytectomy and small durotomy for cord deformity 2t osteophyte, central bulge, stenosis     Prec: Fall  Pt able to unbuckle seat belt, TC day Wed, 3hr tx,, reg diet.    PMH: myelopathy, chronic back pain, OSA, sciatica  Procedure/Surgical Hx:  lumbar fusion L4-5 (04/22/2011)  redo lumbar fusion L5-S1 (2012)  L5-S1 lumbar fusion (2005)  thumb surgery (1995)  facial surgery (1981)  Tonsils and adenoids (1972)  colonoscopy  hernia   tonsillectomy  transforaminal epidural steroid injection (07/08/2019)     CASTELLOW, OT, PAIGE A - 09/03/2019 10:19 EDT   Review/Treatments Provided   OT Goals :   OT Short Term Goals    09/02/2019  Eating Goal #1: short los  Other Goal #2: Pt will stand x8 mins while completing BUE activity c SUP in order to improve standing tolerance for ADL.; Revised     OT Plan :   Treatment Frequency: Daily Performed By: Narda Bonds E   08/31/2019  Planned Treatments: Balance training, Basic Activities of Daily Living, Caregiver training, Coordination, Edema management, Education for meticulous skin care and hygiene, Electric modalities, Energy conservation training, Equipment training, Group therapy, HEP, Home mana... Performed By: Blenda Nicely, OT, [place]   08/31/2019     Short Term Goals Reviewed :   Yes   Occupational Therapy Orders :   Occupational Therapy Evaluation and Treatment Inpatient Acute - 09/01/19 13:07:00 EDT, Stop date 09/01/19 13:07:00 EDT     Pain Present :   Yes actual or suspected pain   OT Therapeutic Activity,Mobility,Balance :   Yes   OT Therapeutic Exercise :   Yes   OT Manual Therapy Provided :   Yes   CASTELLOW, OT, PAIGE A - 09/03/2019 10:19 EDT   Pain Assessment   Pain Present :   Yes actual or suspected pain   Pain Present :    Shoulder   Laterality :   Left   Quality :   Aching, Discomfort   Time Pattern :   Constant   Pain Negatively Impacts :   Daily life   Self Report Pain :   Numeric rating scale   Numeric Pain Scale :   7   Numeric Pain Score :   7    CASTELLOW, OT, PAIGE A - 09/03/2019 10:19 EDT   Therapeutic Activities   OT Therapeutic Activities Grid     Activity 1  Activity 2  Activity 3      Activities :    Grasp activities   Grasp activities, Prehension activities   Pinch activities, Prehension activities        Assist :    Close supervision   Close supervision   Distant supervision        Position :          Unsupported short sit        Equipment :    Hand/Digit exerciser              Comments :    Hand gripper left hand 3 sets x 20 for improved functional grasp for ADLs and transfers.  Tendon gliding exs x 8 with left hand for improved intrinsic function, strength and dexterity, followed by small peg board and large peg board for improved pincer/3pt pinch for ADLs.    Theraputty activity to improve L functional grip/pinch for improved ADL skills, finger opposition, finger extension and gross grasp x 8 reps each.           CASTELLOW, OT, PAIGE A - 09/04/2019 17:25 EDT  CASTELLOW, OT, PAIGE A - 09/04/2019 17:25 EDT  CASTELLOW, OT, PAIGE A - 09/04/2019 17:25 EDT       OT Therapeutic Activities RTF :   Therapeutic Activities  Activity 1:  Grasp activities, Pinch activities, Prehension activities, Release activities; Pt completed variety of FM strengthening and dexterity ex seated tableside in gym c focus on L hand including velcro blocks/board, paperclips and rubberbands on wide mouth jar for digit ext. Issued theraputty and demo'd ex for weekend use outside of tx.       Performed Date:  09/02/2019  Activity 2:  Other: LUE grip strength and [place]; Supported sit; Tolerated well; To further address L grip strength and FMC deficits, pt removes small items from med/firm grade theraputty. Educated to remove items with L hand and pt  benefits from cueing and reminders to use L vs R hand to remove items. Increased time required, pt       Performed Date:  09/01/2019  Activity 3:  tolerates well. Session ends with pt creating a moderately complex pegboard design with small pegboard, using L hand to place pegs. Again, increased time is required due to LUE [facility] deficits, however pt able to complete the task with no assist.       Performed Date:  09/01/2019  Activity 4:  Transitional movement; Mod I, Distant S; Standing; Rolling walker; Tolerated well; Pt ambulates therapy gym back to hospital room (~50 ft) with use of RW with distant supervision/mod I. Left seated in chair with MD present and all needs in reach/met.       Performed Date:  09/01/2019     Colman Cater A - 09/03/2019 10:19 EDT   Therapeutic Exercise   OT Therapeutic Exercise Grid     Exercise 1  Exercise 2        Exercise :    Upper extremity strengthening   Scapular strengthening           Position :    Unsupported short sit   Unsupported short sit           Equipment/Device :    Theraband   Dowel rod           Repetition/Time :    3 sets x 10biceps/triceps ext    3 sets x 10 scap protra/retr with dowel           Resistance/Assist :    red theraband min resistance one arm at a time   min resistance with theraband attached to dowel to reinforce scapular strength           Comments :    BUE stre/endu exs for improved upper body strength/endurance for ADLs/mobility   Scapular strengthening for improved functional use Bues with daily tasks.              CASTELLOW, OT, PAIGE A - 09/04/2019 17:25 EDT  CASTELLOW, OT, PAIGE A - 09/04/2019 17:25 EDT        Therapeutic Exercise RTF :   Therapeutic Exercise  Exercise 1:  Other: scapular and UE  strengthenign/postural ex; Pt performed BUE [place] [facility] c towel roll between elbow and thorax against green/mod res theraband RUE, yellow/low res on LUE, 3x10 reps c attn to scapular depression to stabilize. Pt further performed scapular retrac/depr against  blue/mod-hi res theraband tied       Performed Date:  09/02/2019  Exercise 2:  to dowel rod c max cueing initially, then c distant SUP xapprox. 50 reps.       Performed Date:  09/02/2019     Colman Cater A - 09/03/2019 10:19 EDT   Manual Therapy/Massage   Manual Therapy/Massage Grid     Activity 1  Activity 2        Type :    Myofascial/Soft tissue mobilization, Retrograde massage   Retrograde massage           Region :       Left shoulder           Technique :    Trigger point release              Rationale :    Decrease spasm, Relaxation   Relaxation           Comment  (Comment: Trp release to L upper traps, supraspinatus, lev scap and traps to decr pain/stiffness and to incr blood flow. Pain leve from 8/10 to 5/10 after tx.  [CASTELLOW, OT, PAIGE A - 09/04/2019 17:25 EDT] )  (Comment: soft tissue massage following trp release to left upper traps/supraspinatus with Biofreeze for pain relief, muscle relaxation and gentle stretch with good results (3 subj pain levels per pt.).  [CASTELLOW, OT, PAIGE A - 09/04/2019 17:25 EDT] )        CASTELLOW, OT, PAIGE A - 09/04/2019 17:25 EDT  CASTELLOW, OT, PAIGE A - 09/04/2019 17:25 EDT        Short Term Goals   Eating Goal Grid     Goal #1          Comment :    short los                CASTELLOW, OT, PAIGE A - 09/03/2019 10:19 EDT         Other OT Goal Grid     Goal #1  Goal #2        Goal :    Pt will stand x5 mins while completing BUE activity c SUP in order to improve standing tolerance for ADL.         Pt will stand x8 mins while completing BUE activity c SUP in order to improve standing tolerance for ADL.           Status :    Goal met   Revised           Date Met :    09/02/2019 EDT                CASTELLOW, OT, PAIGE A - 09/03/2019 10:19 EDT  CASTELLOW, OT, PAIGE A - 09/03/2019 10:19 EDT        OT ST Goals Reviewed :   Simonne Maffucci, OT, PAIGE A - 09/03/2019 10:19 EDT   Education   Occupational Therapy Education Grid   Activity of Daily Living Training :   Verbalizes  understanding, Demonstrates, Needs practice/supervision, Needs further teaching   Plan of Care :   Verbalizes understanding, Needs further teaching   CASTELLOW, OT, PAIGE A - 09/03/2019 10:19 EDT   Assessment   OT  Impairments or Limitations :   Balance deficits, Basic activity of daily living deficits, Coordination deficits, Decreased knowledge of condition, Endurance deficits, Equipment training, IADL deficits, Mobility deficits, Pain, Proprioception deficits, Sensory deficits, Strength deficits, Visual perceptual deficits   Barriers to Safe Discharge OT :   Past medical history, Severity of deficits   OT Discharge Recommendations :   Home c outpt tx services vs. HH to follow.      OT Treatment Recommendations :   Pt. active participant in group today offering home safety suggestions (ie. removing throw rugs) to other group members. Pt. during 1:1 treatment with initial pain level of 9/10, following trp release, retrograde massage, and Biofreeze pt reports pain level 5/10.  Pt mod I on unit with ADLs and mobility, walking multiple laps with staff members.  Left grip still significantly less that R grip:  right grip= 110/100/95, left grip=34/35/35 lbs.  pt. very motivated and continues to benefit from skilled rehab OT to further improve LUE weakness and fine motor coordination deficits.      CASTELLOW, OT, PAIGE A - 09/04/2019 17:25 EDT   Time Spent With Patient   OT Therapeutic Exercise Units :   1 units   OT Therapeutic Exercise Time :   15 minutes   OT Treatment Time Comment :   OT ther ex group- BUE stretching within spinal precautions,(shld, elbow, wrist) free wts. 2 lbs. bicep curls, scap protr/retr 3 x 10, reviewed home safety techniques and ways to prevent falls, good participation in group-offering safe ideas for home use.    CASTELLOW, OT, PAIGE A - 09/04/2019 17:25 EDT   OT Time In :   10:00 EST   OT Time Out :   11:30 EST   OT Group Therapy Time :   30 minutes   OT Group Therapy Units :   30 Minutes   OT  Manual Therapy Units :   1 units   OT Manual Therapy Treatment Time :   15 minutes   CASTELLOW, OT, PAIGE A - 09/03/2019 10:19 EDT   OT FUNCTIONAL TRNG 15 MIN :   2 units   OT Functional Activities Minutes :   30 minutes   CASTELLOW, OT, PAIGE A - 09/04/2019 17:25 EDT   OT Total Individual Therapy Time Rehab :   60    OT Total Group Therapy Time :   30 minutes   OT Total Timed Code Treatment Unit Rehab :   4    OT Total Timed Code Treatment Minutes Rehab :   60    OT Total Untimed Code Treatment Min Rehab :   30    OT Total Treatment Time Rehab :   90 minutes   CASTELLOW, OT, PAIGE A - 09/03/2019 10:19 EDT   Image 1 -  Images currently included in the form version of this document have not been included in the text rendition version of the form.

## 2019-09-05 LAB — CBC
Hematocrit: 43.2 % (ref 38.0–52.0)
Hematocrit: 41.5 % (ref 38.0–52.0)
Hemoglobin: 13.8 g/dL (ref 13.0–17.3)
Hemoglobin: 13.6 g/dL (ref 13.0–17.3)
MCH: 29.3 pg (ref 27.0–34.5)
MCH: 29.8 pg (ref 27.0–34.5)
MCHC: 31.9 g/dL — ABNORMAL LOW (ref 32.0–36.0)
MCHC: 32.8 g/dL (ref 32.0–36.0)
MCV: 91.7 fL (ref 84.0–100.0)
MCV: 90.8 fL (ref 84.0–100.0)
MPV: 9.2 fL (ref 7.2–13.2)
MPV: 9.3 fL (ref 7.2–13.2)
NRBC Absolute: 0 10*3/uL (ref 0.000–0.012)
NRBC Absolute: 0 10*3/uL (ref 0.000–0.012)
NRBC Automated: 0 % (ref 0.0–0.2)
NRBC Automated: 0 % (ref 0.0–0.2)
Platelets: 230 10*3/uL (ref 140–440)
Platelets: 228 10*3/uL (ref 140–440)
RBC: 4.71 x10e6/mcL (ref 4.00–5.60)
RBC: 4.57 x10e6/mcL (ref 4.00–5.60)
RDW: 11.9 % (ref 11.0–16.0)
RDW: 11.9 % (ref 11.0–16.0)
WBC: 6 10*3/uL (ref 3.8–10.6)
WBC: 5.5 10*3/uL (ref 3.8–10.6)

## 2019-09-05 LAB — COMPREHENSIVE METABOLIC PANEL
ALT: 29 U/L (ref 0–41)
AST: 22 U/L (ref 0–40)
Albumin/Globulin Ratio: 1.42 mmol/L (ref 1.00–2.00)
Albumin: 3.7 g/dL (ref 3.5–5.2)
Alk Phosphatase: 69 U/L (ref 40–130)
Anion Gap: 7 mmol/L (ref 2–17)
BUN: 13 mg/dL (ref 6–20)
CO2: 28 mmol/L (ref 22–29)
Calcium: 8.9 mg/dL (ref 8.6–10.0)
Chloride: 102 mmol/L (ref 98–107)
Creatinine: 0.8 mg/dL (ref 0.7–1.3)
GFR African American: 113 mL/min/{1.73_m2} (ref >=90)
GFR Non-African American: 98 mL/min/{1.73_m2} (ref >=90)
Globulin: 2.6 g/dL (ref 1.9–4.4)
Glucose: 103 mg/dL — ABNORMAL HIGH (ref 70–99)
Osmolaliy Calculated: 274 mosm/kg (ref 270–287)
Potassium: 4.5 mmol/L (ref 3.5–5.3)
Sodium: 137 mmol/L (ref 135–145)
Total Bilirubin: 0.2 mg/dL (ref 0.00–1.20)
Total Protein: 6.3 g/dL — ABNORMAL LOW (ref 6.4–8.3)

## 2019-09-05 LAB — CREATININE
Creatinine: 0.8 mg/dL (ref 0.7–1.3)
GFR African American: 113 mL/min/{1.73_m2} (ref >=90)
GFR Non-African American: 98 mL/min/{1.73_m2} (ref >=90)

## 2019-09-05 NOTE — Telephone Encounter (Signed)
Spoke with patient and he was sincere about following our visitor policies and recommended that he bring cell phone to have her on speaker phone during visit. He states that now he knows the process he will go through and allow enough time to get here and be on time. He was very appreciative for the call back and confirmed time and date of his appointment. He had no further questions at this time and will see [place] at his upcoming appointment.

## 2019-09-05 NOTE — Progress Notes (Signed)
 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    PMH: myelopathy, chronic back pain, OSA, sciatica  Procedure/Surgical Hx:  lumbar fusion L4-5 (04/22/2011)  redo lumbar fusion L5-S1 (2012)  L5-S1 lumbar fusion (2005)  thumb surgery (1995)  facial surgery (1981)  Tonsils and adenoids (1972)  colonoscopy  hernia   tonsillectomy  transforaminal epidural steroid injection (07/08/2019)     KRAFT, OT, MIRA E - 09/05/2019 14:17 EDT   General Information   Occupational Therapy Orders :   Occupational Therapy Evaluation and Treatment Inpatient Acute - 09/01/19 13:07:00 EDT, Stop date 09/01/19 13:07:00 EDT     Precautions RTF :   Precaution Orders   No qualifying data available.     Pain Present :   Yes actual or suspected pain   Orientation Assessment :   Oriented x 4   KRAFT, OT, MIRA E - 09/05/2019 15:08 EDT   Pain Assessment   Pain Present :   Yes actual or suspected pain   Pain Present :   Back   Self Report Pain :   Numeric rating scale   KRAFT, OT, MIRA E - 09/05/2019 15:08 EDT   Home Environment   Living Environment :   Home Environment  *ADL:  Independent  Performed By:  Sebastian Robbert Tinnie MARLA 09/05/2019  *Cognitive-Communication Skills:  Independent  Performed By:  Sebastian Robbert, Tinnie MARLA 09/05/2019  *Instrumental ADL:  Independent  Performed By:  Sebastian Robbert Tinnie MARLA 09/05/2019  *Mobility:  Independent  Performed By:  Sebastian Robbert Tinnie MARLA 09/05/2019  Anticipated Need for Home Modification:  No  Performed By:  Sebastian Robbert Tinnie MARLA 09/05/2019  Kitchen:  1st floor  Performed By:  Sebastian Robbert Tinnie MARLA 09/05/2019  Laundry:   1st floor  Performed By:  Sebastian Robbert Tinnie MARLA 09/05/2019  Lives In:  Single level home  Performed By:  Sebastian Robbert Tinnie MARLA 09/05/2019  Number of Stairs Inside:  1  Performed By:  Sebastian Robbert Tinnie MARLA 09/05/2019  Number of Stairs Outside:  3  Performed By:  Sebastian Robbert Tinnie MARLA 09/05/2019  Outside Stairs Rail:  Bilateral  Performed By:  Sebastian Robbert Tinnie MARLA 09/05/2019  Patient's Responsibilities:  Community mobility, Disabled, Hospital doctor, Landscape architect, Financial planner, Health and wellness, Home management, Leisure/Play/Hobbies, Manage Medications, Meal preparation, Out to eat, Personal ADL, Shopping, Social participation  Performed By:  Sebastian Robbert Tinnie MARLA 09/05/2019  Primary Bathroom:  1st floor  Performed By:  Sebastian Robbert Tinnie MARLA 09/05/2019  Primary Bedroom:  1st floor  Performed By:  Sebastian Robbert Tinnie MARLA 09/05/2019  Sensory Deficits:  None  Performed By:  Martell RN, Neil A 08/30/2019     Lives In :   Single level home   [place], OT, [place] E - 09/05/2019 15:08 EDT   Rehabilitation Stairs Grid     Inside Stairs  Outside Stairs        Number of Stairs :    1  3            Rail :       Bilateral             KRAFT, OT, MIRA E - 09/05/2019 15:08 EDT  KRAFT, OT, MIRA E - 09/05/2019 15:08 EDT        Home Setup Grid   Primary Bedroom :   1st floor   Primary Bathroom :   1st floor   Kitchen :   1st floor   Laundry :   1st floor   [place], OT, MIRA E - 09/05/2019 15:08 EDT   Patient's Responsibilities Rehab :   Community mobility, Disabled, Hospital doctor, Landscape architect, Financial planner, Health and wellness, Home management, Leisure/Play/Hobbies, Manage Medications, Meal preparation, Out to eat, Personal ADL, Shopping, Social participation   [place], [place], [place] E - 09/05/2019 15:08 EDT   Home Environment II   Living Environment :   Home Environment  *ADL:  Independent  Performed By:  [name] ALMETA Gauze 08/31/2019  *Cognitive-Communication Skills:  Independent  Performed By:   [name], PT, Amber 08/31/2019  *Instrumental ADL:  Independent  Performed By:  [name], PT, Amber 08/31/2019  *Mobility:  Independent  Performed By:  [name], PT, Amber 08/31/2019  Anticipated Need for Home Modification:  No  Performed By:  [name], PT, Amber 08/31/2019  Kitchen:  1st floor  Performed By:  [name], PT, Amber 08/31/2019  Laundry:  1st floor  Performed By:  [name], PT, Amber 08/31/2019  Lives In:  Single level home  Performed By:  [name], PT, Amber 08/31/2019  Number of Stairs Inside:  1  Performed By:  [name] ALMETA, Amber 08/31/2019  Number of Stairs Outside:  3  Performed By:  [name] ALMETA, Amber 08/31/2019  Outside Stairs Rail:  Bilateral  Performed By:  [name] ALMETA, Amber 08/31/2019  Patient's Responsibilities:  Community mobility, Disabled, Hospital doctor, Landscape architect, Financial planner, Health and wellness, Home management, Leisure/Play/Hobbies, Manage Medications, Meal preparation, Out to eat, Personal ADL, Shopping, Social participation  Performed By:  [name], PT, Amber 08/31/2019  Primary Bathroom:  1st floor  Performed By:  [name] ALMETA, Amber 08/31/2019  Primary Bedroom:  1st floor  Performed By:  [name] ALMETA, Amber 08/31/2019  Sensory Deficits:  None  Performed By:  Martell RN, Neil LABOR 08/30/2019     KRAFT, OT, MIRA E - 09/05/2019 15:08 EDT   Prior Functional Status Grid   ADL :   Independent   Mobility :   Independent   Instrumental ADL :   Independent   Cognitive-Communication Skills :   Independent   KRAFT, OT, MIRA E - 09/05/2019 15:08 EDT   OT Basic ADL   OT Basic ADL 2019   Eating :   Independent - Patient/Resident completes the activities by him/herself, with or without an assistive device, with no assistance from a helper. - 06   Oral Hygiene :   Independent - Patient/Resident completes the activities by him/herself, with or without an assistive device, with no assistance from a helper. - 06   Toileting Hygiene :   Independent - Patient/Resident completes the activities by  him/herself, with or without an assistive device, with no assistance from a helper. - 06   Toilet Transfer :   Independent - Patient/Resident completes the activities by him/herself, with or without an assistive device, with no assistance from a helper. - 06   Shower, Bathe Self :   Independent - Patient/Resident completes the activities by him/herself, with or without an assistive  device, with no assistance from a helper. - 06   Upper Body Dressing :   Independent - Patient/Resident completes the activities by him/herself, with or without an assistive device, with no assistance from a helper. - 06   Lower Body Dressing :   Independent - Patient/Resident completes the activities by him/herself, with or without an assistive device, with no assistance from a helper. - 06   Putting On, Taking Off Footwear :   Independent - Patient/Resident completes the activities by him/herself, with or without an assistive device, with no assistance from a helper. - 06   Grooming :   Independent - Patient/Resident completes the activities by him/herself, with or without an assistive device, with no assistance from a helper. - 06   Tub Transfer :   Not attempted due to environmental limitations - 10   Shower Transfer :   Independent - Patient/Resident completes the activities by him/herself, with or without an assistive device, with no assistance from a helper. - 06   KRAFT, OT, MIRA E - 09/05/2019 15:08 EDT   Limiting Factors :   Pain   OT ADL Reviewed :   Yes   KRAFT, OT, MIRA E - 09/05/2019 15:08 EDT   Standing Balance   Static Standing Balance   Stands Without UE Support :   Rehab Complete independence   Stands with One UE Support :   Rehab Complete independence   Stands with Two UE Support :   Rehab Complete independence   KRAFT, OT, MIRA E - 09/05/2019 15:08 EDT   Standing Surface Evaluated Upon :   Floor   KRAFT, OT, MIRA E - 09/05/2019 15:08 EDT   DCP GENERIC CODE   Anterior Shift Standing Balance :   Able   Posterior Shift :    Able   Lateral to the Left Shift :   Able   Lateral to the Right Shift :   Able   KRAFT, OT, MIRA E - 09/05/2019 15:08 EDT   Sitting Balance Assessment   Sitting Surface Evaluated Upon :   Bed   KRAFT, OT, MIRA E - 09/05/2019 15:08 EDT   Static Sitting Balance Assessment Grid   Sits Without UE Support :   Rehab Complete independence   Sits With One UE Support :   Rehab Complete independence   Sits With Two UE Support :   Rehab Complete independence   KRAFT, OT, MIRA E - 09/05/2019 15:08 EDT   Dynamic Sitting Balance Assessment Grid   Anterior Shift :   Able   Posterior Shift :   Able   Lateral to the Left Shift :   Able   Lateral to the Right Shift :   Able   KRAFT, OT, MIRA E - 09/05/2019 15:08 EDT   Righting Reactions Grid   Left Protective Reactions :   Present   Right Protective Reactions :   Present   Left Righting Reactions :   Present   Right Righting Reactions :   Present   KRAFT, OT, MIRA E - 09/05/2019 15:08 EDT   Cognition   Cognition :   Within functional limits   KRAFT, OT, MIRA E - 09/05/2019 15:08 EDT   Memory Cognition Grid   Retrieval :   Impaired   KRAFT, OT, MIRA E - 09/05/2019 15:08 EDT   Vision/Perception   Vision Status :   Other: Blurry vision L eye, peripheral vision intact, scanning all fields, noted pupil more constricted on L  vs. R. MD ok'd trial of eye patch.   KRAFT, OT, MIRA E - 09/05/2019 15:08 EDT   UE ROM/Strength   Upper Extremity Overall ROM Grid   Left Upper Extremity Active Range :   Within functional limits   Right Upper Extremity Active Range :   Within functional limits   KRAFT, OT, MIRA E - 09/05/2019 15:08 EDT   Lt Upper Extremity Strength :   Other: AROM WFL, grossly 4/5MMT proximally, 3+/5 MMT distally.   Rt Upper Extremity Strength :   Within functional limits   KRAFT, OT, MIRA E - 09/05/2019 15:08 EDT   UE Coordination   9 Hole Peg Test Left :   24 seconds   Right 9 Hole Peg Test :   17 seconds   Mean Age Gender Lt 9 Hole Peg Test :   21.64   Right Mean for Age/Gender :   20.90    KRAFT, OT, MIRA E - 09/05/2019 14:17 EDT   Left Upper Extremity Coordination Grid   Diadochokinesia :   Within functional limits   Finger to Nose :   Within functional limits   Finger Opposition :   Within functional limits   KRAFT, OT, MIRA E - 09/05/2019 14:17 EDT   Right Upper Extremty Coordination Grid   Diadochokinesia :   Within functional limits   Finger to Nose :   Within functional limits   Finger Opposition :   Within functional limits   KRAFT, OT, MIRA E - 09/05/2019 14:17 EDT   Other Coordination Tests,Results :   slower on L, coord deficits on L with finger to nose.   KRAFT, OT, MIRA E - 09/05/2019 14:17 EDT   UE Sensation   Left Sensation Grid   Light Touch :   Intact   Proprioception :   Intact   KRAFT, OT, MIRA E - 09/05/2019 15:08 EDT   Right Sensation Grid   Light Touch :   Intact   Proprioception :   Intact   KRAFT, OT, MIRA E - 09/05/2019 15:08 EDT   Impact of Impaired UE Sensation :   BUE   KRAFT, OT, MIRA E - 09/05/2019 15:08 EDT   UE Function   Left Grip Strength Trial 1 :   34 lb   Left Grip Strength Trial 2 :   35 lb   Left Grip Strength Trial 3 :   35 lb   Left Hand Grip Strength :   34.7 lb   KRAFT, OT, MIRA E - 09/05/2019 15:12 EDT   Mean for Age,Gender Left Hand Grip :   83.2 lbs   KRAFT, OT, MIRA E - 09/05/2019 14:17 EDT   Right Grip Strength Trial 1 :   110 lb   Right Grip Strength Trial 2 :   100 lb   Right Grip Strength Trial 3 :   95 lb   Right Hand Grip Strength :   101.7 lb   KRAFT, OT, MIRA E - 09/05/2019 15:12 EDT   Mean for Age,Gender Right Hand Grip :   101.1 lbs   KRAFT, OT, MIRA E - 09/05/2019 14:17 EDT   Upper Extremity Functional Scale Grid   Left :   Refined functional assist   Right :   Functional   KRAFT, OT, MIRA E - 09/05/2019 14:17 EDT   Special Tests   OT Special Tests Grid     Test #1  Test #2        Special  Test :    Line bisection test   Star cancellation test           Test Results :    100 accurate   no errors             KRAFT, OT, MIRA E - 09/05/2019 14:17 EDT  KRAFT,  OT, MIRA E - 09/05/2019 14:17 EDT        Education   Responsible Learner Present for Session :   Yes   Home Caregiver Name/Relationship :   self   Barriers To Learning :   None evident   Teaching Method :   Demonstration, Explanation, Printed materials   [place], OT, MIRA E - 09/05/2019 15:08 EDT   Occupational Therapy Education Grid   Activity of Daily Living Training :   [employer] understanding, Production designer, theatre/television/film :   [employer], Demonstrates   KRAFT, OT, MIRA E - 09/05/2019 15:08 EDT   Assessment   OT Impairments or Limitations :   Balance deficits, Basic activity of daily living deficits, Coordination deficits, Decreased knowledge of condition, Endurance deficits, Equipment training, IADL deficits, Mobility deficits, Pain, Proprioception deficits, Sensory deficits, Strength deficits, Visual perceptual deficits   Barriers to Safe Discharge OT :   Past medical history, Severity of deficits   OT Discharge Recommendations :   Home c outpt tx services vs. HH to follow.      OT Treatment Recommendations :   Pt discharging home with wife tomorrow. He has progressed well in OT, demonstrating increased activity tolerance and FM coord in L hand at d/c. He performs BADL at mod I -I level and has been provided with HEP for postural ex, UE stretches and FM strengthening and coord ex.      KRAFT, OT, MIRA E - 09/05/2019 15:12 EDT   Long Term Goals   OT Patient/Caregiver Goal :    to do small things with my hands   KRAFT, OT, MIRA E - 09/05/2019 15:08 EDT   KRAFT, OT, MIRA E - 09/05/2019 15:08 EDT   OT IP Long Term Goals Grid     Long Term Goal 1  Long Term Goal 2        Goal :    Pt will improve LUE 9hole peg test time by 5 seconds indicating improved function on FM component of ADL.   Pt will increase L grip strength by 3lbs indicating improved function on FM component of ADL.           Status :    Goal met   Not met           Date Met :    09/05/2019 EDT              Comments :       Pt improved LUE grip  by 2.4lbs             KRAFT, OT, MIRA E - 09/05/2019 15:12 EDT  KRAFT, OT, MIRA E - 09/05/2019 15:12 EDT        OT Long Term Goals 2019   Eating Goal :   Independent - Patient/Resident completes the activities by him/herself, with or without an assistive device, with no assistance from a helper. - 06   Oral Hygiene Goal :   Independent - Patient/Resident completes the activities by him/herself, with or without an assistive device, with no assistance from a helper. - 06   Toileting Hygiene Goal :   Independent -  Patient/Resident completes the activities by him/herself, with or without an assistive device, with no assistance from a helper. - 06   Shower, Bathe Self Goal :   Independent - Patient/Resident completes the activities by him/herself, with or without an assistive device, with no assistance from a helper. - 06   Upper Body Dressing Goal :   Independent - Patient/Resident completes the activities by him/herself, with or without an assistive device, with no assistance from a helper. - 06   Lower Body Dressing Goal :   Independent - Patient/Resident completes the activities by him/herself, with or without an assistive device, with no assistance from a helper. - 06   Put On,Taking Off Footwear Goal :   Independent - Patient/Resident completes the activities by him/herself, with or without an assistive device, with no assistance from a helper. - 06   Toilet Transfer Goal :   Independent - Patient/Resident completes the activities by him/herself, with or without an assistive device, with no assistance from a helper. - 06   KRAFT, OT, MIRA E - 09/05/2019 15:08 EDT   OT LT Goals Reviewed :   Yes   KRAFT, OT, MIRA E - 09/05/2019 15:08 EDT   Short Term Goals   Eating Goal Grid     Goal #1          Comment :    short los                KRAFT, OT, [place] E - 09/05/2019 15:12 EDT         Other OT Goal Grid     Goal #1  Goal #2        Goal :    Pt will stand x5 mins while completing BUE activity c SUP in order to improve standing  tolerance for ADL.         Pt will stand x8 mins while completing BUE activity c SUP in order to improve standing tolerance for ADL.           Status :    Goal met   Goal met           Date Met :    09/02/2019 EDT                KRAFT, OT, MIRA E - 09/05/2019 15:12 EDT  KRAFT, OT, MIRA E - 09/05/2019 15:12 EDT        OT ST Goals Reviewed :   Yes   KRAFT, OT, MIRA E - 09/05/2019 15:12 EDT   Plan   Therapy Activity Tolerance :   3 hours over 5 days   Planned Treatments :   Balance training, Basic Activities of Daily Living, Caregiver training, Coordination, Edema management, Education for meticulous skin care and hygiene, Electric modalities, Energy conservation training, Equipment training, Group therapy, HEP, Home management, Home program, Joint protection, Kinesio taping, Manual therapy, Mobility training, Neuromuscular reeducation, Pain management, Patient education, Safety education, Sensory integration, Soft Tissue Mobilization, Therapeutic activities, Therapeutic exercises, Therapeutic exercises for strengthening and ROM, Thermal modalities, Visual and perceptual training, Work simplification   Treatment Plan/Goals Established With Patient/Caregiver :   Yes   OT Evaluation Complete :   Yes   KRAFT, OT, MIRA E - 09/05/2019 15:12 EDT   Time Spent With Patient   OT Time In :   13:30 EST   OT Time Out :   14:30 EST   OT Therapeutic Exercise Units :   4 units  OT Therapeutic Exercise Time :   60 minutes   OT Total Individual Therapy Time Rehab :   60    OT Total Timed Code Treatment Unit Rehab :   4    OT Total Timed Code Treatment Minutes Rehab :   60    OT Total Treatment Time Rehab :   60 minutes   KRAFT, OT, MIRA E - 09/05/2019 15:12 EDT   Image 1 -  Images currently included in the form version of this document have not been included in the text rendition version of the form.   Additional Information   Additional Information OT :   during tx this date, pt performed supine and seated BUE rotator cuff stretches,  postural isometrics for scap retrac/depression and various FM coordination ex and activities including differential tendon glides, digit ext agains rubber band resistance and in hand manipulation of various manipulatives using L hand. Pt provided c HEP and handouts, as well as 2 resistances of theraputty.      KRAFT, OT, MIRA E - 09/05/2019 15:12 EDT   Section GG: Self Care Abilities   Section GG: Self Care Abilities 2019   *Eating :   Independent - Patient/Resident completes the activities by him/herself, with or without an assistive device, with no assistance from a helper. - 06   *Oral Hygiene :   Independent - Patient/Resident completes the activities by him/herself, with or without an assistive device, with no assistance from a helper. - 06   *Toileting Hygiene :   Independent - Patient/Resident completes the activities by him/herself, with or without an assistive device, with no assistance from a helper. - 06   *Shower, Bathe Self :   Independent - Patient/Resident completes the activities by him/herself, with or without an assistive device, with no assistance from a helper. - 06   *Upper Body Dressing :   Independent - Patient/Resident completes the activities by him/herself, with or without an assistive device, with no assistance from a helper. - 06   *Lower Body Dressing :   Independent - Patient/Resident completes the activities by him/herself, with or without an assistive device, with no assistance from a helper. - 06   *Putting On, Taking Off Footwear :   Independent - Patient/Resident completes the activities by him/herself, with or without an assistive device, with no assistance from a helper. - 06   *Toilet Transfer :   Independent - Patient/Resident completes the activities by him/herself, with or without an assistive device, with no assistance from a helper. - 06   KRAFT, OT, MIRA E - 09/05/2019 15:12 EDT

## 2019-09-05 NOTE — Progress Notes (Signed)
OT Time Spent With Patient - Text       OT Time Spent With Patient Entered On:  09/05/2019 9:02 EDT    Performed On:  09/05/2019 8:58 EDT by Dorathy Daft               Time Spent With Patient   OT Time In :   8:30 EST   OT Time Out :   9:00 EST   OTA Functional Trng 15 Min :   2 units   OT Functional Activities Minutes :   30 minutes   OT Total Individual Therapy Time Rehab :   30    OT Treatment Time Comment :   pt completed community mobility with cane up/down ramps/stairs/uneven surfaces/grass/cobblestone long distance with no LOB- 1 cue for pacing (very talkative c narrow walkway on cobble stones becomes slightly distracted)   OT Total Timed Code Treatment Unit Rehab :   2    OT Total Timed Code Treatment Minutes Rehab :   30    OT Total Treatment Time Rehab :   30 minutes   Dorathy Daft - 09/05/2019 8:58 EDT   Image 1 -  Images currently included in the form version of this document have not been included in the text rendition version of the form.

## 2019-09-05 NOTE — Progress Notes (Signed)
 PT Inpatient Discharge Exam -Text       PT Inpatient Discharge Exam Entered On:  09/05/2019 9:58 Savage    Performed On:  09/05/2019 10:00 Savage by Jesse Savage, Lauren Savage               Reason for Treatment   Subjective Statement :   Pt agreeable to PT this am.     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 Pt indep/green in room & t/o hospital. No alarms required.      Jesse Savage 09/05/2019 14:19 Savage   General Information   Physical Therapy Orders :   Physical Therapy Inpatient Additional Treatment Rehab - 08/31/19 21:43:21 Savage, Balance training, Basic activities of daily living, Bed mobility training, Body weight support treadmill training, Community/Work reintegration, Electrical stimulation, Equipment training, Functional training, Gait training, Gro...     Precautions RTF :   Precaution Orders   No qualifying data available.     Orientation Assessment :   Oriented x 4   Affect/Behavior :   Appropriate, Calm, Cooperative   Pain Present :   Yes actual or suspected pain   Culture/Spiritual Beliefs to Incorporate :   No   Jesse Savage - 09/05/2019 12:08 Savage   Pain Assessment   Pain Present :   Yes actual or suspected pain   Pain Present :   Lumbar   Quality :   Radiating   Onset :   Sudden   Jesse Savage - 09/05/2019 12:08 Savage     Jesse Savage - 09/05/2019 14:19 Savage     Home Environment   Living Environment :   Home Environment  *ADL:  Independent  Performed By:  Jesse Savage 08/31/2019  *Cognitive-Communication Skills:  Independent  Performed By:  Jesse, PT,  Savage 08/31/2019  *Instrumental ADL:  Independent  Performed By:  Jesse Savage 08/31/2019  *Mobility:  Independent  Performed By:  Jesse Savage 08/31/2019  Anticipated Need for Home Modification:  No  Performed By:  Jesse Savage 08/31/2019  Kitchen:  1st floor  Performed By:  Jesse Savage 08/31/2019  Laundry:  1st floor  Performed By:  Jesse Savage 08/31/2019  Lives In:  Single level home  Performed By:  Jesse Savage 08/31/2019  Number of Stairs Inside:  1  Performed By:  Jesse ALMETA, Savage 08/31/2019  Number of Stairs Outside:  3  Performed By:  Jesse ALMETA, Savage 08/31/2019  Outside Stairs Rail:  Bilateral  Performed By:  Jesse ALMETA, Savage 08/31/2019  Patient's Responsibilities:  Community mobility, Disabled, Hospital doctor, Landscape architect, Financial planner, Health and wellness, Home management, Leisure/Play/Hobbies, Manage Medications, Meal preparation, Out to eat, Personal ADL, Shopping, Social participation  Performed By:  Jesse Savage 08/31/2019  Primary Bathroom:  1st floor  Performed By:  Jesse ALMETA, Savage 08/31/2019  Primary Bedroom:  1st floor  Performed By:  Jesse ALMETA, Savage 08/31/2019  Sensory Deficits:  None  Performed  By:  Jesse Savage 08/30/2019     Lives In :   Single level home   Anticipated   Need For Home   Modification :   No   Jesse Savage - 09/05/2019 12:08 Savage   Stairs     Inside Stairs  Outside Stairs        Number of Stairs :    1    3            Rail :       Bilateral             Jesse Savage - 09/05/2019 12:08 Savage  Jesse Savage - 09/05/2019 12:08 Savage        Home Setup   Primary Bedroom :   1st floor   Primary Bathroom :   1st floor   Kitchen :   1st floor   Laundry :   1st floor   Jesse Savage - 09/05/2019 12:08 Savage   Patient's Responsibilities :   Community mobility, Disabled, Hospital doctor, Landscape architect, Financial planner, Health and wellness, Home management, Leisure/Play/Hobbies,  Manage Medications, Meal preparation, Out to eat, Personal ADL, Shopping, Social participation   Jesse Savage - 09/05/2019 12:08 Savage   Home Environment II   Living Environment :   Home Environment  *ADL:  Independent  Performed By:  Jesse Savage 08/31/2019  *Cognitive-Communication Skills:  Independent  Performed By:  Jesse Savage 08/31/2019  *Instrumental ADL:  Independent  Performed By:  Jesse Savage 08/31/2019  *Mobility:  Independent  Performed By:  Jesse Savage 08/31/2019  Kitchen:  1st floor  Performed By:  Jesse Savage 08/31/2019  Laundry:  1st floor  Performed By:  Jesse Savage 08/31/2019  Lives In:  Single level home  Performed By:  Jesse Savage 08/31/2019  Number of Stairs Inside:  1  Performed By:  Jesse Savage 08/31/2019  Number of Stairs Outside:  3  Performed By:  Jesse Savage 08/31/2019  Outside Stairs Rail:  Bilateral  Performed By:  Jesse Savage 08/31/2019  Patient's Responsibilities:  Community mobility, Disabled, Hospital doctor, Landscape architect, Financial planner, Health and wellness, Home management, Leisure/Play/Hobbies, Manage Medications, Meal preparation, Out to eat, Personal ADL, Shopping, Social participation  Performed By:  Jesse Savage 08/31/2019  Primary Bathroom:  1st floor  Performed By:  Jesse Savage 08/31/2019  Primary Bedroom:  1st floor  Performed By:  Jesse Savage 08/31/2019  Sensory Deficits:  None  Performed By:  Jesse Savage 08/30/2019  Anticipated Need for Home Modification:  No  Performed By:  Jesse Savage 08/30/2019     Jesse Savage - 09/05/2019 12:08 Savage   Prior Functional Status   ADL :   Independent   Mobility :   Independent   Instrumental ADL :   Independent   Cognitive-Communication Skills :   Jesse Savage - 09/05/2019 12:08 Savage   LE ROM/Strength   Left Lower Extremity Strength Grid   Hip Flexion :   4+   Hip Extension :   4+   Hip  Abduction :   5   Knee Flexion :   4+   Knee Extension :   4+   Jesse Savage - 09/05/2019 12:14 Savage  Ankle Dorsiflexion :   4       Ankle Plantarflexion :   4   Hegeman, PT, Savage - 09/05/2019 21:55 Savage       Right Lower Extremity Strength Grid   Hip Flexion :   5   Hip Extension :   4+   Hip Abduction :   5   Knee Flexion :   5   Knee Extension :   4   Ankle Dorsiflexion :   5   Ankle Plantarflexion :   4   Jesse Savage - 09/05/2019 12:14 Savage   LE/Trunk Tone   Left Lower Extremity Tone     Hegeman, PT, Savage - 09/05/2019 21:55 Savage       Right Lower Extremity :   Normal   UE Tone         remainder (less than 1/2 of ROM)   Left Upper Extremity :   Normal   Right Upper Extremity :   Normal   Jesse Savage - 09/05/2019 12:14 Savage   UE Coordination   9 Hole Peg Test Left :   44 seconds   Right 9 Hole Peg Test :   21 seconds   Mean Age Gender Lt 9 Hole Peg Test :   21.64   Right Mean for Age/Gender :   20.90   Jesse Savage - 09/05/2019 12:14 Savage   Left Upper Extremity Coordination Grid   Diadochokinesia :   Within functional limits   Finger to Nose :   Within functional limits   Finger Opposition :   Within functional limits   Jesse Savage - 09/05/2019 12:14 Savage   Right Upper Extremty Coordination Grid   Diadochokinesia :   Within functional limits   Finger to Nose :   Within functional limits   Finger Opposition :   Within functional limits   Jesse Savage - 09/05/2019 12:14 Savage   Other Coordination Tests,Results :   slower on L, coord deficits on L with finger to nose.   Jesse Savage - 09/05/2019 12:14 Savage   UE Function   Left Grip Strength Trial 1 :   35 lb   Left Grip Strength Trial 2 :   32 lb   Left Grip Strength Trial 3 :   30 lb   Left Hand Grip Strength :   32.3 lb   Mean for Age,Gender Left Hand Grip :   83.2 lbs   Right Grip Strength Trial 1 :   94 lb   Right Grip Strength Trial 2 :   114 lb   Right Grip Strength Trial 3 :    110 lb   Right Hand Grip Strength :   106 lb   Mean for Age,Gender Right Hand Grip :   101.1 lbs   Jesse Savage - 09/05/2019 12:44 Savage   Upper Extremity Functional Scale Grid   Left :   Refined functional assist   Right :   Functional   Jesse Savage - 09/05/2019 12:44 Savage   Sensation   Left Upper Extremity Sensation   Light Touch :   Intact   Proprioception :   Intact   Jesse Savage - 09/05/2019 12:14 Savage   Right Upper Extremity Sensation   Light Touch :   Intact   Proprioception :   Intact   Jesse Savage Tinnie Savage - 09/05/2019  12:14 Savage   Impact of Impaired UE Sensation :   BUE   Jesse Savage - 09/05/2019 12:14 Savage   Jesse Savage - 09/05/2019 12:14 Savage   Left Lower Extremity Sensation   Proprioception :   Intact   Jesse Savage - 09/05/2019 12:14 Savage   Sharie Touch :   Impaired   Hegeman, PT, Savage - 09/05/2019 21:55 Savage       Right Lower Extremity Sensation   Light Touch :   Intact   Proprioception :   Intact   Jesse Savage - 09/05/2019 12:14 Savage   LE Coordination   Other LE Coordination Tests, Results :   Pt can sucessfully demo single and alternation toe taps on B LE feet.   Jesse Savage - 09/05/2019 12:44 Savage   Jesse Savage - 09/05/2019 12:44 Savage   Trace Square Left Lower Extremity   Toe Taps :   Within functional limits   Jesse Savage - 09/05/2019 12:14 Savage   Right Lower Extremity Coordination   Toe Taps :   Within functional limits   Jesse Savage - 09/05/2019 12:14 Savage   Sitting Balance   Static Sitting Balance Assessment Grid   Sits Without UE Support :   Rehab Complete independence   Sits With One UE Support :   Rehab Complete independence   Sits With Two UE Support :   Rehab Complete independence   Jesse Savage - 09/05/2019 12:14 Savage   Sitting Surface Evaluated Upon :   Bed   Jesse Savage - 09/05/2019 12:14 Savage   Dynamic Sitting Balance  Assessment Grid   Anterior Shift :   Able   Posterior Shift :   Able   Lateral to the Left Shift :   Able   Lateral to the Right Shift :   Able   Jesse Savage - 09/05/2019 12:14 Savage   Righting Reactions Grid   Left Protective Reactions :   Present   Right Protective Reactions :   Present   Left Righting Reactions :   Present   Right Righting Reactions :   Present   Jesse Savage - 09/05/2019 12:14 Savage   Standing Balance   Static Standing Balance   Stands Without UE Support :   Rehab Complete independence   Stands with One UE Support :   Rehab Complete independence   Stands with Two UE Support :   Rehab Complete independence   Jesse Savage 09/05/2019 14:19 Savage   Standing Surface Evaluated Upon :   Floor   Jesse Savage - 09/05/2019 12:44 Savage   DCP GENERIC CODE   Anterior Shift Standing Balance :   Able   Posterior Shift :   Able   Lateral to the Left Shift :   Able   Lateral to the Right Shift :   Able   Jesse Savage - 09/05/2019 12:44 Savage   PT Mobility   PT Mobility 2019   Roll Prone :   Not attempted due to medical condition or safety concerns - 88   Floor Recovery :   Not attempted due to medical condition or safety concerns - 44   [place],  Jessica L - 09/05/2019 14:19 Savage   Roll Left :   Independent - Patient/Resident completes the activities by him/herself, with or without an  assistive device, with no assistance from Savage helper. - 06   Roll Right :   Independent - Patient/Resident completes the activities by him/herself, with or without an assistive device, with no assistance from Savage helper. - 06   Roll Left and Right :   Independent - Patient/Resident completes the activities by him/herself, with or without an assistive device, with no assistance from Savage helper. - 06   Roll Supine :   Independent - Patient/Resident completes the activities by him/herself, with or without an assistive device, with no assistance from Savage helper. - 06   Sit to Lying :   Independent  - Patient/Resident completes the activities by him/herself, with or without an assistive device, with no assistance from Savage helper. - 06   Lying to Sitting on Side of Bed :   Independent - Patient/Resident completes the activities by him/herself, with or without an assistive device, with no assistance from Savage helper. - 06   Scooting :   Independent - Patient/Resident completes the activities by him/herself, with or without an assistive device, with no assistance from Savage helper. - 06   Sit to Stand :   Independent - Patient/Resident completes the activities by him/herself, with or without an assistive device, with no assistance from Savage helper. - 06   Stand to Sit :   Independent - Patient/Resident completes the activities by him/herself, with or without an assistive device, with no assistance from Savage helper. - 06   Chair, Bed to Chair Transfer :   Independent - Patient/Resident completes the activities by him/herself, with or without an assistive device, with no assistance from Savage helper. - 06   Car Transfer :   Independent - Patient/Resident completes the activities by him/herself, with or without an assistive device, with no assistance from Savage helper. - 06   Jesse Savage, Lauren Savage - 09/05/2019 12:23 Savage   Independent - Patient/Resident completes the activities by him/herself, with or without an assistive device, with no assistance from Savage helper. - 06 :   .SABRA   Jesse Savage, Lauren Savage - 09/05/2019 12:23 Savage   Jesse Savage - 09/05/2019 12:23 Savage   PT Ambulation 2019   Picking Up Object :   Independent - Patient/Resident completes the activities by him/herself, with or without an assistive device, with no assistance from Savage helper. - 06   (Comment: c use of reacher  [place] Harlene CROME - 09/05/2019 14:19 Savage] )   Jesse Harlene CROME - 09/05/2019 14:19 Savage   Walk 10 Feet :   Independent - Patient/Resident completes the activities by him/herself, with or without an assistive device, with no assistance from Savage  helper. - 06   Walk 50 Feet with Two Turns :   Independent - Patient/Resident completes the activities by him/herself, with or without an assistive device, with no assistance from Savage helper. - 06   Walk 150 Feet :   Independent - Patient/Resident completes the activities by him/herself, with or without an assistive device, with no assistance from Savage helper. - 06   Walking 10 Feet Uneven Surfaces :   Independent - Patient/Resident completes the activities by him/herself, with or without an assistive device, with no assistance from Savage helper. - 06   1 Step (Curb) :   Independent - Patient/Resident completes the activities by him/herself, with or without an assistive device, with no assistance from Savage helper. - 06   Ramp Ambulation :   Independent - Patient/Resident completes  the activities by him/herself, with or without an assistive device, with no assistance from Savage helper. - 06   4 Steps :   Independent - Patient/Resident completes the activities by him/herself, with or without an assistive device, with no assistance from Savage helper. - 06   12 Steps :   Independent - Patient/Resident completes the activities by him/herself, with or without an assistive device, with no assistance from Savage helper. - 06   Jesse Savage - 09/05/2019 12:23 Savage   Transfer Type :   Stand step   Ambulation Device Utilized :   Straight cane   Distance Level Surface :   600 ft   Distance Uneven Surface :   40 ft   Number of Stairs :   12    PT Mobility Reviewed :   Yes   Jesse Savage - 09/05/2019 12:23 Savage   WC Management   Wheelchair Details :   N/Savage--pt ambulatory     PT Wheelchair Mobilitiy Reviewed Rehab :   Yes   Jesse Savage - 09/05/2019 12:23 Savage   Special Tests   PT Special Tests Grid     Test #1  Test #2        Special Tests :                Test Results :    1.47m/s   1.23 m/s           Special Tests Comments :    c RW at fastest gait speed   (09/05/2019) c cane and independence at fastest  gait speed             Jesse Savage, Tinnie Savage - 09/05/2019 12:23 Savage  Jesse Harlene CROME - 09/05/2019 14:19 Savage        DME     Jesse Harlene CROME - 09/05/2019 14:19 Savage     Education   Teaching Method :   Demonstration, Explanation   Jesse Savage - 09/05/2019 12:44 Savage   Responsible Learner Present for Session :   Yes   Home Caregiver Name/Relationship :   self   Jesse Savage Tinnie Savage - 09/05/2019 12:23 Savage   Jesse Savage - 09/05/2019 12:23 Savage   Physical Therapy Education Grid   Bed Mobility :   [place] understanding, Demonstrates   Bed Positioning :   Verbalizes understanding, Demonstrates   Bed to Chair Transfers :   [employer] understanding, Demonstrates   Jesse Harlene CROME Savage 09/05/2019 14:19 Savage   Ambulation with [place] :   [place] understanding, Demonstrates   Body Mechanics :   Verbalizes understanding, Demonstrates   Car Transfers :   [employer] understanding, Demonstrates   Stairs :   TEFL teacher understanding, Demonstrates   Jesse Savage - 09/05/2019 12:23 Savage   Home Safety :   [place] understanding, Demonstrates   Jesse Harlene CROME - 09/05/2019 14:19 Savage   PT Additional Education :   Pt education on step to pattern instead of recip. gait pattern d/t pain and for safe stair [place] for home use. Pt demo truck t/f w/ 6 blocks to safely t/f into elevated truck at home.     Jesse Savage - 09/05/2019 12:44 Savage   Assessment   PT Impairments or Limitations :   Abnormal tone, Ambulation deficits, Balance deficits, Endurance deficits, Equipment training, Impaired sensation, Pain limiting function, Range of motion deficits,  Strength deficits   Discharge Recommendations :   ELOS: 5-7 days to progress to PLOF without AD; home with wife-Rec OPPT     Jesse Savage - 09/05/2019 12:14 Savage   PT Treatment Recommendations :   Pt shows improvements in LE strength, LE coordination and sensation of B UE/LEs. Pt has met all LTGs demonstrating independence  w/ cane. Pt demo car t/f w/use of 6 stacked blocks to simulate raised trunk t/f for home. Pt demo 12 (6) steps w/independence for mobility. However, Pt advised on step to gait pattern with stair climbing to increase home safety d/t LB pain today when descending steps with recip. gait pattern attempted. Pt has improved to use of independence w/cane vs. Savage RW previously. Pt would benefit from OPPT to continue to improve strength, balance, endurance and to maximize his functionality for independence at home and in the community.       SPTAs note reviewed, revised & co-signed by Harlene Copes, PTA.     Copes Harlene CROME - 09/05/2019 14:19 Savage   Short Term Goals   Bed Mobility Goal Grid     Goal #1          Comments :    See LTG 2* ELOS                Jesse Savage - 09/05/2019 12:23 Savage         PT ST Goals Reviewed :   Chaney Jesse Savage - 09/05/2019 12:23 Savage   PT Long Term Goals   PT LTG Reconcilation :   PT DC exam co-signed by Jeoffrey Barcelona, PT for Harlene [place] 09/05/19   Hegeman, PT, Savage - 09/05/2019 21:55 Savage   PT LT Goals Reviewed :   Chaney Jesse Savage - 09/05/2019 12:23 Savage   PT Patient,Caregiver Goal :   I want to be able to do everything for myself   Jesse Savage - 09/05/2019 12:14 Savage   Jesse Savage, Tinnie Savage - 09/05/2019 12:14 Savage   Outpatient PT Long Term Goals Rehab     Long Term Goal 1          Goal :    Pt will ambulate with no device x 500' independently in 5 days Jesse Savage - 09/05/2019 12:14 Savage              Status :    Not met Barcelona, PT, Savage - 09/05/2019 21:55 Savage              Date Met :    09/05/2019 Savage              Comments :    Ambulation c cane Hegeman, PT, Savage - 09/05/2019 21:55 Savage                Jesse Savage - 09/05/2019 12:23 Savage         Mobility Goal 2019   Roll Left and Right Goal :   Independent - Patient/Resident completes the activities by him/herself, with or without an assistive device,  with no assistance from Savage helper. - 06   (Comment: GOAL MET [place]ell Harlene CROME - 09/05/2019 14:19 Savage] )   Sit to Lying Goal :   Independent - Patient/Resident completes the activities by him/herself, with or without an assistive device, with no assistance from Savage helper. - 06   (Comment:  GOAL MET [place] Harlene CROME - 09/05/2019 14:19 Savage] )   Lying to Sitting Side of Bed Goal :   Independent - Patient/Resident completes the activities by him/herself, with or without an assistive device, with no assistance from Savage helper. - 06   (Comment: GOAL MET [place] Harlene CROME - 09/05/2019 14:19 Savage] )   Sit to Stand Goal :   Independent - Patient/Resident completes the activities by him/herself, with or without an assistive device, with no assistance from Savage helper. - 06   (Comment: GOAL MET [place] Harlene CROME - 09/05/2019 14:19 Savage] )   Chair,Bed to Chair Transfer Goal :   Independent - Patient/Resident completes the activities by him/herself, with or without an assistive device, with no assistance from Savage helper. - 06   (Comment: GOAL MET [place] Harlene CROME - 09/05/2019 14:19 Savage] )   Car Transfer Goal :   Independent - Patient/Resident completes the activities by him/herself, with or without an assistive device, with no assistance from Savage helper. - 06   (Comment: GOAL MET [place] Harlene CROME - 09/05/2019 14:19 Savage] )   Jesse Harlene L - 09/05/2019 14:19 Savage   Walk Goals 2019   Walk 10 Feet Goal :   Independent - Patient/Resident completes the activities by him/herself, with or without an assistive device, with no assistance from Savage helper. - 06   (Comment: GOAL MET [place] Harlene CROME - 09/05/2019 14:19 Savage] )   Walk 50 Feet with Two Turns Goal :   Independent - Patient/Resident completes the activities by him/herself, with or without an assistive device, with no assistance from Savage helper. - 06   (Comment: GOAL MET [place] Harlene CROME - 09/05/2019 14:19 Savage] )   Walk 150 Feet Goal :   Independent - Patient/Resident completes the  activities by him/herself, with or without an assistive device, with no assistance from Savage helper. - 06   (Comment: GOAL MET [place] Harlene CROME - 09/05/2019 14:19 Savage] )   Walk 10 Ft Uneven Surface Goal :   Independent - Patient/Resident completes the activities by him/herself, with or without an assistive device, with no assistance from Savage helper. - 06   (Comment: GOAL MET [place] Harlene CROME - 09/05/2019 14:19 Savage] )   1 Step (Curb) Goal :   Independent - Patient/Resident completes the activities by him/herself, with or without an assistive device, with no assistance from Savage helper. - 06   (Comment: GOAL MET [place] Harlene CROME - 09/05/2019 14:19 Savage] )   4 Steps Goal :   Independent - Patient/Resident completes the activities by him/herself, with or without an assistive device, with no assistance from Savage helper. - 06   (Comment: GOAL MET [place] Harlene CROME - 09/05/2019 14:19 Savage] )   12 Steps Goal :   Independent - Patient/Resident completes the activities by him/herself, with or without an assistive device, with no assistance from Savage helper. - 06   (Comment: GOAL MET [place] Harlene CROME - 09/05/2019 14:19 Savage] )   Picking Up Object Goal :   Independent - Patient/Resident completes the activities by him/herself, with or without an assistive device, with no assistance from Savage helper. - 06   (Comment: GOAL MET [place] Harlene CROME - 09/05/2019 14:19 Savage] )   Jesse Harlene CROME - 09/05/2019 14:19 Savage   Plan   PT Evaluation Date :   09/05/2019 10:00 Savage   Evaluation  Complete :   Yes   Jesse Savage - 09/05/2019 12:23 Savage   PT Therapy Days Per Week :   5   PT Therapy Hours Per Day :   1.5   PT Therapy Duration :   4-7 Days   Notes Tasked :   Daily   Notes Duration Units :   1    PT Duration Unit Rehab :   Weeks   Treatments Planned :   Balance training, Basic activities of daily living, Bed mobility training, Body weight support treadmill training, Community/Work reintegration, Electrical stimulation, Equipment  training, Functional training, Gait training, Group therapy, Manual therapy, Moist heat/ice, Neuromuscular reeducation, Pain management, Patient education, Self Care Management, Soft tissue massage/MFR, Stair training, Therapeutic activities, Therapeutic exercises, Transfer training   Treatment Plan/Goals Established With Patient/Caregiver :   Yes   Jesse Savage - 09/05/2019 12:14 Savage   Time Spent With Patient   PTA Self Care ADLTraining Units :   4 units   PT Self Care ADL Training Time :   60 minutes   PT Total Individual Time Rehab :   60    PT Total Timed Code Treatment Unit Rehab :   4    PT Total Timed Code Treatment Minutes Rehab :   60    PT Total Treatment Time Rehab :   60 minutes   Jesse Savage - 09/05/2019 12:08 Savage   PT Time In :   10:00 EST   PT Time Out :   11:00 EST   Jesse Savage - 09/05/2019 9:57 Savage   Section GG: Admission Mobility Abilities   GG0170 Patient Walk :   Yes   GG0170 Patient Use Wheelchair,Scooter :   No   Jesse Harlene CROME - 09/05/2019 14:19 Savage   Jesse Harlene L - 09/05/2019 14:19 Savage   Section GG: Admission Mobility 2019   *Roll Left and Right :   Independent - Patient/Resident completes the activities by him/herself, with or without an assistive device, with no assistance from Savage helper. - 06   *Sit to Lying :   Independent - Patient/Resident completes the activities by him/herself, with or without an assistive device, with no assistance from Savage helper. - 06   *Lying to Sitting on Side of Bed :   Independent - Patient/Resident completes the activities by him/herself, with or without an assistive device, with no assistance from Savage helper. - 06   *Sit to Stand :   Independent - Patient/Resident completes the activities by him/herself, with or without an assistive device, with no assistance from Savage helper. - 06   *Chair,Bed to Chair Transfer :   Independent - Patient/Resident completes the activities by him/herself, with or without an assistive  device, with no assistance from Savage helper. - 06   *Car Transfer :   Independent - Patient/Resident completes the activities by him/herself, with or without an assistive device, with no assistance from Savage helper. - 06   Jesse Savage - 09/05/2019 12:23 Savage   Section GG: Admission Ambulation 2019   *Picking Up Object :   Independent - Patient/Resident completes the activities by him/herself, with or without an assistive device, with no assistance from Savage helper. - 06   Jesse Harlene CROME - 09/05/2019 14:19 Savage   *Walk 10 Feet :   Independent - Patient/Resident completes the activities by him/herself, with or without an assistive device, with no assistance from  Savage helper. - 06   *Walk 50 Feet with Two Turns :   Independent - Patient/Resident completes the activities by him/herself, with or without an assistive device, with no assistance from Savage helper. - 06   *Walk 150 Feet :   Independent - Patient/Resident completes the activities by him/herself, with or without an assistive device, with no assistance from Savage helper. - 06   *Walk 10 Feet Uneven Surfaces :   Independent - Patient/Resident completes the activities by him/herself, with or without an assistive device, with no assistance from Savage helper. - 06   *1 Step (Curb) :   Independent - Patient/Resident completes the activities by him/herself, with or without an assistive device, with no assistance from Savage helper. - 06   *4 Steps :   Independent - Patient/Resident completes the activities by him/herself, with or without an assistive device, with no assistance from Savage helper. - 06   *12 Steps :   Independent - Patient/Resident completes the activities by him/herself, with or without an assistive device, with no assistance from Savage helper. - 06   Jesse Savage - 09/05/2019 12:23 Savage

## 2019-09-05 NOTE — Case Communication (Signed)
CM Discharge Planning Assessment - Text       CM Discharge Planning Ongoing Assessment Entered On:  09/05/2019 17:17 EDT    Performed On:  09/05/2019 17:14 EDT by Ulyses Southward T               Discharge Needs I   Previously Documented Discharge Needs :   DISCHARGE PLAN/NEEDS:  Anticipated Discharge Date: 09/06/2019 Montine Circle,  Curt Bears T - 08/31/19 17:51:00  Needs Assistance with Transportation: Yes Montine Circle,  Curt Bears T - 08/31/19 17:51:00  EQUIPMENT/TREATMENT NEEDS:  Needs Assistance at Home Upon Discharge: No Ulyses Southward T - 08/31/19 17:51:00  Professional Skilled Services: Occupational Therapy, Physical Therapy Ulyses Southward T - 08/31/19 17:51:00       Previously Documented Benefits Information :   Performed By: Ilona Sorrel  - 08/30/19 09:40:00       Anticipated Discharge Date :   09/06/2019 EDT   Anticipated Discharge Time Slot :   1000-1200   CM Progress Note :   Patient is a 59 year old male who presented to [facility]19 to have a scheduled procedure for his neck pain, unsteadiness of gait, and tendency to stumble when he walks or turns briskly.  He was also having weakness in his hands, radicular pain as well.  Patient was being followed by Dr. Marlou Porch for lower back pain that had not responded to conversative management, with a disc osteophyte at L3-4 on the left.  He had recently received a selective nerve root block at L3-4 and had excellent relief of his pain for 3-4 days, then his pain returned.  At his most recent office visit, he complained of shoulder blade pain on the left, intrascapular pain, and suboccipital headaches.  Dr. Wayna Chalet NP, Salvadore Dom, wanted a cervical and thoracic MRI d/t these new complaints.    08/18/19 MRI cervical spine w/o contrast:  IMPRESSION:      1.  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 No  abnormal cord signal.    2.  At C4-C5, central bulge with mild ventral cord deformity.    3.  Between moderately  severe and severe foraminal stenosis on the right at  C3-C4, on the left at C4-C5, bilaterally at C5-C6 and on the left at C6-C7.    It was felt that he would benefit from an ACDF at C5/6.  He admitted 3/19 and had the following scheduled surgery:  C5-C6 ACDF including osteophytectomy; also with small durotomy. Patient had post-operative weakness and numbness.  MRI of cervical spine was obtained post-op:  IMPRESSION: Expected postoperative changes at C5-C6 again with canal stenosis  and similar cord deformity. No cord signal abnormality or concerning fluid  collection.    Per notes, his RUE/RLE muscle strength was completely normal.  His left hand grip was weak, as were his triceps, biceps, and deltoids about a 3.5 out of 5.  His LLE was diffusely weak.  It was felt that this was d/t some form of neuropraxia abnormality or contusion . He was started on steroids and transferred to the ICU post-op.  3/19 CT head/brain showed no evidence of acute intracranial abnormality.  Per Pumonology consult in ICU, patient reported blurry vision of his left eye, left hand grip weakness and paresthesias, left lower extremity weakness and paresthesias.    3/20 Patient's neck pain was somewhat improved.  He was c/o dysesthetic pain in the hand and slightly up the forearm  on the left side.  He continued to c/o blurred vision in the left eye and numbness/tingling in his face.  He was also having some spasms in BLE.  It was felt that he would benefit from an MRI or CTA to further evaluate the carotid artery on the right.  Patient was evaluated by therapies, who recommended he transfer to acute inpatient rehab prior to returning home.  He was stable and transferred out of the ICU to the Neurospine unit.  He continued to improve.  Patient able to wear a c-collar if desired for stability.  3/22 MRA head w/o contrast revealed a normal MRA of the head.  Patient continued to progress with therapies.    3/23 He is eager to transfer to acute  inpatient rehab to continue his recovery prior to returning home.      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  PT has MCR primary & lives w/spouse Dondra Spry [phone]).  First team conf tomorrow. KD    3/24 Team conf today, target Dc 3/30 with Dc goals for mod I.  I dw pt & stepdau at bedside.  Team rec oupt thx;  pt prefers [employer] & reports he has DME in place at home. KD    3/29 All in place for pt's DC to home tomorrow.  He has requested outpt PT 2-3X week at [facility]. KD     Duffy Rhody T - 09/05/2019 17:14 EDT   Discharge Needs II   DischargDischarge Device/Equipment CMe Device/Equipment CM :   Mudlogger Skilled Services :   Physical Therapy   Needs Assistance with Transportation :   Yes   Discharge Transportation Arranged :   N/A   Needs Assistance at Home Upon Discharge :   No   Requires Caregiver Involvement :   No   Discharge Planning Time Spent :   45 minutes   Vincent Peyer - 09/05/2019 17:14 EDT   Discharge Planning   Discharge Arrangements :       Patient Post-Acute Information    Patient Name: Jesse Savage, Jesse Savage  MRN: [id number]  FIN: [id number]  Gender: Male  DOB: 06-13-2060  Age:  39 Years        *** No Post-Acute Placement(s) Listed ***          *** No Post-Acute Service(s) Listed Child psychotherapist Services :   MD to provide RX for outpt PT 2-3 x week at [facility] (call [phone] to schedule 1st appt)     Duffy Rhody T - 09/05/2019  17:14 EDT

## 2019-09-06 NOTE — Nursing Note (Signed)
Nursing Discharge Summary - Text       Nursing Discharge Summary Entered On:  09/06/2019 16:47 EDT    Performed On:  09/06/2019 16:45 EDT by Esmeralda Arthur, RN, Jan S               DC Information   Discharge To :   Family support, Outpatient rehabilitation   Mode of Discharge :   Wheelchair   Transportation :   Private vehicle   Accompanied By :   Winferd Humphrey, RN, Jan S - 09/06/2019 16:45 EDT   Education   Responsible Learner(s) :   Home Caregiver Name/Relationship: self        Performed by: Eilleen Kempf E - 09/05/2019 14:17     Home Caregiver Present for Session :   Yes   Barriers To Learning :   None evident   Teaching Method :   Demonstration, Explanation, Printed materials, Teach-back   Esmeralda Arthur, RN, Jan S - 09/06/2019 16:45 EDT   Post-Hospital Education Adult Grid   Activity Expectations :   Trenton Gammon understanding   Bladder Management :   Verbalizes understanding   Bowel Management :   Verbalizes understanding   Disease Process :   Verbalizes understanding   Equipment/Devices :   TEFL teacher understanding   Importance of Follow-Up Visits :   Verbalizes understanding   Pain Management :   TEFL teacher understanding   Physical Limitations :   Verbalizes understanding   Plan of Care :   Verbalizes understanding   Postoperative Instructions :   Verbalizes understanding   When to Call Health Care Provider :   Trenton Gammon understanding   Esmeralda Arthur, RN, Jan S - 09/06/2019 16:45 EDT   Health Maintenance Education Adult Grid   Allergies :   Verbalizes understanding   Bathing/Hygiene :   TEFL teacher understanding   Diet/Nutrition :   TEFL teacher understanding   Exercise :   Verbalizes understanding   Esmeralda Arthur, RN, Jan S - 09/06/2019 16:45 EDT   Medication Education Adult Scientist, research (medical), Medication :   Verbalizes understanding   Esmeralda Arthur, RN, Jan S - 09/06/2019 16:45 EDT   Safety Education Adult Grid   Safety, Fall :   Verbalizes understanding   [place], RN, Jan S - 09/06/2019 16:45 EDT   Education Referral Made To :   Physical Therapy   Time  Spent Educating Patient :   15 minutes   Garner Gavel, Jan S - 09/06/2019 16:45 EDT

## 2019-09-06 NOTE — Case Communication (Signed)
Care Management Discharge Plan - Text       Care Management Discharge Plan Entered On:  09/06/2019 16:47 EDT    Performed On:  09/06/2019 16:47 EDT by Duffy Rhody T               Discharge IRF-PAI   Discharge to Living Setting :   Home (private home/apt., board/care, assisted living, group home, transitional living)   Admit Lives With IRF-PAI :   Family or relatives   IRF-PAI Program Interruptions :   No   Duffy Rhody T - 09/06/2019 16:47 EDT

## 2019-09-06 NOTE — Nursing Note (Signed)
Discharge Note-Nursing               d/c instructions explained to pt. verbalized understanding. outpt. PT rx. given to pt. jsc  [employer]

## 2019-09-06 NOTE — Other (Signed)
IRF-PAI Quality Indicators Disch -Text       IRF-PAI Quality Indicators Discharge Entered On:  09/06/2019 16:44 EDT    Performed On:  09/06/2019 16:43 EDT by Esmeralda Arthur, RN, Jan S               Section GG: Discharge Mobility Functional Abilities   *Roll Left and Right :   Independent - Patient/Resident completes the activities by him/herself, with or without an assistive device, with no assistance from a helper. - 06   *Sit to Lying :   Independent - Patient/Resident completes the activities by him/herself, with or without an assistive device, with no assistance from a helper. - 06   *Lying to Sitting on Side of Bed :   Independent - Patient/Resident completes the activities by him/herself, with or without an assistive device, with no assistance from a helper. - 06   *Sit to Stand :   Independent - Patient/Resident completes the activities by him/herself, with or without an assistive device, with no assistance from a helper. - 06   *Chair,Bed to Chair Transfer :   Independent - Patient/Resident completes the activities by him/herself, with or without an assistive device, with no assistance from a helper. - 06   *Toilet Transfer :   Independent - Patient/Resident completes the activities by him/herself, with or without an assistive device, with no assistance from a helper. - 06   Esmeralda Arthur, RN, Jan S - 09/06/2019 16:43 EDT   Section J: Discharge Health Conditions   J1800 Any Falls Since Admission :   No   Garner Gavel, Jan S - 09/06/2019 16:43 EDT   Section N: Discharge Medications   N2005 Medication Intervention :   No   Garner Gavel, Jan S - 09/06/2019 16:43 EDT

## 2019-09-17 NOTE — Progress Notes (Signed)
Cardiology Office Note  Date:  09/19/2019   ID:  Erik Perkins, DOB [birth year], MRN [id number]  PCP:  [facility]   Chief Complaint  Patient presents with  . office visit    12 month F/U; Meds verbally reviewed with patient.    HPI:  [name] is a very pleasant 59 year old gentleman with a long history of  smoking, hyperlipidemia,  hypertension  [facility] on January 21 2010 with stuttering worsening chest discomfort,  troponin elevation 0.33,  cardiac catheterization showing severe 99% distal RCA disease.  DES stent was placed . He presents for routine followup of his coronary artery disease  And new onset leg swelling   Works long hours on concrete. No new chest pain wants to retire 2 years Works out of airport, [employer]  No significant leg swelling Previously reported tingling in his feet   quit smoking prior to his last MI.   Heart rate low on today's visit, asymptomatic  Lab work reviewed from the [facility] hemoglobin A1c 5.6 Total cholesterol 140 Direct LDL 81  EKG personally reviewed by myself on today's visit Shows normal sinus rhythm. 53 bpm.   Other past medical history reviewed  Cardiac catheterization showed 99% distal RCA disease, moderate to severe proximal RCA disease estimated at 60-70%, mild diffuse LAD and left circumflex disease, moderate ostial D2 and D3 disease, moderate distal inferior wall hypokinesis on LV gram. A Xience 3.5 x 12 mm DES stent was placed to his distal RCA.}  PMH:   has a past medical history of Coronary artery disease, Hyperlipidemia, Hypertension, and MI (myocardial infarction) ([place]).  PSH:    Past Surgical History:  Procedure Laterality Date  . CARDIAC CATHETERIZATION    . FRACTURE SURGERY Right    wrist  59yo  . VASECTOMY  1984    Current Outpatient Medications  Medication Sig Dispense Refill  . aspirin 81 MG tablet Take 81 mg by mouth daily.    . clopidogrel (PLAVIX) 75 MG tablet TAKE 1 TABLET  DAILY 90 tablet 4  . lisinopril (ZESTRIL) 5 MG tablet TAKE 1 TABLET DAILY 90 tablet 0  . metoprolol tartrate (LOPRESSOR) 25 MG tablet Take 12.5 mg by mouth 2 (two) times daily.    . nitroGLYCERIN (NITROSTAT) 0.4 MG SL tablet Place 1 tablet (0.4 mg total) under the tongue every 5 (five) minutes as needed for chest pain. 25 tablet 0  . omeprazole (PRILOSEC) 20 MG capsule Take 20 mg by mouth 2 (two) times daily.    . rosuvastatin (CRESTOR) 40 MG tablet TAKE 1 TABLET DAILY 90 tablet 4  . tamsulosin (FLOMAX) 0.4 MG CAPS capsule Take 1 capsule (0.4 mg total) by mouth 2 (two) times daily.    . furosemide (LASIX) 20 MG tablet Take 1 tablet (20 mg total) by mouth daily as needed. (Patient not taking: Reported on 09/19/2019) 90 tablet 3  . potassium chloride SA (K-DUR,KLOR-CON) 20 MEQ tablet Take 1 tablet (20 mEq total) by mouth daily as needed. (Patient not taking: Reported on 09/19/2019) 90 tablet 3   No current facility-administered medications for this visit.     Allergies:   Other   Social History:  The patient  reports that he quit smoking about 9 years ago. He has a 25.00 pack-year smoking history. He has never used smokeless tobacco. He reports current alcohol use of about 4.0 standard drinks of alcohol per week. He reports that he does not use drugs.   Family History:   family  history includes Cancer in his father; Coronary artery disease in his sister; Diabetes in his sister; Hyperlipidemia in his father and sister.    Review of Systems: Review of Systems  Constitutional: Negative.   HENT: Negative.   Respiratory: Negative.   Cardiovascular: Negative.   Gastrointestinal: Negative.   Musculoskeletal: Negative.   Neurological: Negative.   Psychiatric/Behavioral: Negative.   All other systems reviewed and are negative.   PHYSICAL EXAM: VS:  BP 130/70 (BP Location: Left Arm, Patient Position: Sitting, Cuff Size: Normal)   Pulse (!) 53   Ht 5\' 10"  (1.778 m)   Wt 222 lb (100.7 kg)    SpO2 97%   BMI 31.85 kg/m  , BMI Body mass index is 31.85 kg/m. GEN: Well nourished, well developed, in no acute distress  HEENT: normal  Neck: no JVD, carotid bruits, or masses Cardiac: RRR; no murmurs, rubs, or gallops,no edema  Respiratory:  clear to auscultation bilaterally, normal work of breathing GI: soft, nontender, nondistended, + BS MS: no deformity or atrophy  Skin: warm and dry, no rash Neuro:  Strength and sensation are intact Psych: euthymic mood, full affect   Recent Labs: 01/17/2019: BUN 20; Creatinine, Ser 1.21; Hemoglobin 14.2; Platelets 222; Potassium 3.7; Sodium 139    Lipid Panel No results found for: CHOL, HDL, LDLCALC, TRIG    Wt Readings from Last 3 Encounters:  09/19/19 222 lb (100.7 kg)  01/17/19 215 lb (97.5 kg)  08/25/18 215 lb 12 oz (97.9 kg)      ASSESSMENT AND PLAN:  Atherosclerosis of native coronary artery of native heart with angina pectoris (HCC) -  Currently with no symptoms of angina. No further workup at this time. Continue current medication regimen.  Essential hypertension Blood pressure is well controlled on today's visit. No changes made to the medications.  Lower extremity edema Minimal lower extremity edema, does not take Lasix on a regular basis  Smoking hx stopped smoking many years ago.  Denies shortness of breath  Hyperlipidemia Lifestyle modification recommended Cholesterol close to goal, LDL slightly high  Neuropathy Etiology unclear, nondiabetic Previously felt to be her shoes   Total encounter time more than 25 minutes  Greater than 50% was spent in counseling and coordination of care with the patient   Disposition: F/U  12 months   Orders Placed This Encounter  Procedures  . EKG 12-Lead      Signed, 08/27/18, M.D., Ph.D. 09/19/2019  [facility] [place], [place]

## 2019-09-19 ENCOUNTER — Encounter: Payer: Self-pay | Admitting: Cardiovascular Disease

## 2019-09-19 ENCOUNTER — Ambulatory Visit (INDEPENDENT_AMBULATORY_CARE_PROVIDER_SITE_OTHER): Admitting: Cardiovascular Disease

## 2019-09-19 ENCOUNTER — Other Ambulatory Visit: Payer: Self-pay

## 2019-09-19 VITALS — BP 130/70 | HR 53 | Ht 70.0 in | Wt 222.0 lb

## 2019-09-19 DIAGNOSIS — I25118 Atherosclerotic heart disease of native coronary artery with other forms of angina pectoris: Secondary | ICD-10-CM | POA: Diagnosis not present

## 2019-09-19 DIAGNOSIS — I10 Essential (primary) hypertension: Secondary | ICD-10-CM | POA: Diagnosis not present

## 2019-09-19 DIAGNOSIS — E782 Mixed hyperlipidemia: Secondary | ICD-10-CM

## 2019-09-19 NOTE — Patient Instructions (Signed)

## 2019-09-22 NOTE — Progress Notes (Signed)
Outpatient Jesse Savage Certification Letter-Text       Jesse Savage Outpatient Certification Letter Entered On:  09/22/2019 12:31 EDT    Performed On:  09/21/2019 12:29 EDT by Andreas Ohm               Physician Certification   Date of Injury or Surgery :   08/26/2019 EDT   Ordering Physician Name :   Alcide Clever M-MD   Number of Visits This Interval :   69   Dear Physician :   Thank you for your referral. Below is the patient information for the stated interval of treatment. Please review, modify (if necessary), sign, and return. Thank you.   Date of Evaluation :   09/21/2019 EDT   Jesse Savage Certification Interval End :   12/19/2019 EDT   Physician Signature Required :   Yes   Staff Physician Signature :   The physician's electronic signature noted above indicates approval of the documented Plan of Care for the stated interval.   Andreas Ohm - 09/22/2019 12:29 EDT   Plan   Jesse Savage Frequency Outpatient :   Other: 2x/week   Jesse Savage Duration Outpatient :   90    Jesse Savage Duration Unit Outpatient :   Days   Jesse Savage Anticipated Treatments, Needs :   Balance training, Cervical stabilization, Neuromuscular reeducation, Patient education, Therapeutic exercises   Jesse Savage Certification Letter Complete :   Yes   Jesse Savage, Jesse Savage - 09/22/2019 12:29 EDT   Outpatient Review   *Clinical Assessment Summary :   Neuro exam:  Jesse Savage displays significant long track signs.  DTR's: Left KJ and AJ +3+ vs +3 on right.  Brachioradialis DTR absent on right.   Bilateral multibeat clonus with rapid dorsiflexion stretch.  + Roos on left mild in nature.  Mild adidokokinesia, left > right.  Left UE strength grossly 4- to 4/5.  Jesse Savage has isolated motor control of bilateral ankles, knees, hips.  Left calf strength 4/5.    Noted altered perception of tactile sensation on left side of body and UE/LE's.   Functional mobility status: Independent all aspects with stable balance over smooth level surfaces without need for assistive device.    Per history Jesse Savage is not observing post-operative  precautions with ADL whatsoever.       Rehab Potential Physical Therapy :   Jesse Savage, Jesse Savage, Jesse Savage - 09/22/2019 12:29 EDT   Prior Functional Level Grid   ADL :   Independent   Mobility :   Independent   Instrumental ADL :   Independent   Cognitive-Communication Skills :   Jesse Savage, Jesse Savage, Jesse Savage - 09/22/2019 12:29 EDT   Jesse Savage Impairments or Limitations :   Abnormal tone, Coordination/Proprioception deficits, Decreased knowledge of condition, Impaired sensation, Strength deficits, Visual perceptual deficits   Jesse Savage, Jesse Savage - 09/22/2019 12:29 EDT    Signature Line                                                 Electronically Signed On 09/22/19 12:29 PM  _________________________________________________                                               Jesse Savage                        Dicatation Date: 09/22/19 12:29 PM

## 2019-09-23 NOTE — Progress Notes (Signed)
Outpatient OT Certification Letter-Text       OT Outpatient Certification Letter Entered On:  09/23/2019 15:41 EDT    Performed On:  09/21/2019 15:40 EDT by Rejeana Brock OT, [facility]               Physician Certification   Date of Injury or Surgery :   08/26/2019 EDT   Ordering Physician Name :   Alcide Clever M-MD   Number of Visits This Interval :   1   Dear Physician :   Thank you for your referral. Below is the patient information for the stated interval of treatment. Please review, modify (if necessary), sign, and return. Thank you.   Date of Evaluation :   09/21/2019 EDT   OT Certification Interval End :   12/22/2019 EDT   PETIT, OT, CHRISTINA M - 09/23/2019 15:40 EDT   Problem List   (As Of: 09/23/2019 15:41:07 EDT)   Problems(Active)    Acute myelopathy (SNOMED CT  :3825053976 )  Name of Problem:   Acute myelopathy ; Recorder:   LEWEY,  JENNIFER LYNN-FNP; Confirmation:   Confirmed ; Classification:   Medical ; Code:   7341937902 ; Contributor System:   Dietitian ; Last Updated:   08/24/2019 9:04 EDT ; Life Cycle Status:   Active ; Responsible Provider:   Demetrius Revel LYNN-FNP; Vocabulary:   SNOMED CT        Back pain (SNOMED CT  :409735329 )  Name of Problem:   Back pain ; Recorder:   PYLE, RN, JOYCE; Confirmation:   Confirmed ; Classification:   Patient Stated ; Code:   924268341 ; Contributor System:   Dietitian ; Last Updated:   06/28/2019 11:25 EST ; Life Cycle Date:   06/28/2019 ; Life Cycle Status:   Active ; Vocabulary:   SNOMED CT        OSA (obstructive sleep apnea) (SNOMED CT  :962229798 )  Name of Problem:   OSA (obstructive sleep apnea) ; Recorder:   PYLE, RN, JOYCE; Confirmation:   Confirmed ; Classification:   Patient Stated ; Code:   921194174 ; Contributor System:   PowerChart ; Last Updated:   06/28/2019 11:25 EST ; Life Cycle Date:   06/28/2019 ; Life Cycle Status:   Active ; Vocabulary:   SNOMED CT        Sciatica (SNOMED CT  :08144818 )  Name of Problem:   Sciatica ; Recorder:   PYLE, RN, JOYCE;  Confirmation:   Confirmed ; Classification:   Patient Stated ; Code:   [id number] ; Contributor System:   PowerChart ; Last Updated:   06/28/2019 11:25 EST ; Life Cycle Date:   06/28/2019 ; Life Cycle Status:   Active ; Vocabulary:   SNOMED CT          Diagnoses(Active)    Other lack of coordination  Date:   09/21/2019 ; Diagnosis Type:   Other ; Confirmation:   Differential ; Clinical Dx:   Other lack of coordination ; Classification:   Interdisciplinary ; Clinical Service:   Non-Specified ; Code:   ICD-10-CM ; Probability:   0 ; Diagnosis Code:   R27.8        Plan   OT Frequency Outpatient :   Other: 2X/WK   OT Duration Outpatient :   12    OT Duration Unit Outpatient :   Weeks   OT Anticipated Treatments, Needs :   Basic Activities of Daily Living, Coordination, HEP, Manual therapy,  Patient education, Therapeutic activities, Therapeutic exercises, Therapeutic exercises for strengthening and ROM   OT Certification Letter Complete :   Yes   PETIT, OT, CHRISTINA M - 09/23/2019 15:40 EDT   Outpatient Review   OT Clinical Assessment Summary :   Pt is a 59 y/o RHD male who underwent a C5-C6 fusion 08/26/19. Since surgery he has been having weakness in his left hand worse than right. Pt also inidcates his left hand/foot is numb at time. Right hand colder than left. Pt does have a significant reduction in strength of left hand when compared to right. Pt would like to get back to having more coordination/strength in left hand and to reduce  numbness he is feeling along right side. Pt has been educated on putty exercised. Handout was given at end of tx. Pt was also instructed on coordination drills to increase fine motor skills of hands. Will cont OT 2x/wk.      Rehab Potential Occupational Therapy :   Merian Capron, OT, CHRISTINA M - 09/23/2019 15:40 EDT   Prior Functional Level Grid   ADL :   Independent   Mobility :   Independent   Instrumental ADL :   Independent   Cognitive-Communication Skills :   Independent   PETIT, OT,  CHRISTINA M - 09/23/2019 15:40 EDT   OT Impairments or Limitations :   Basic activity of daily living deficits, Coordination deficits, Equipment training, IADL deficits, Proprioception deficits, Range of motion deficits, Safety awareness deficits, Sensory deficits, Strength deficits   PETIT, OT, CHRISTINA M - 09/23/2019 15:40 EDT    Signature Line                                                 Electronically Signed On 09/23/19 03:40 PM                                               _________________________________________________                                               Philomena Doheny, CHRISTIN                        Dicatation Date: 09/23/19 03:40 PM

## 2019-10-07 ENCOUNTER — Other Ambulatory Visit: Payer: Self-pay | Admitting: Cardiovascular Disease

## 2019-10-14 NOTE — Progress Notes (Signed)
PT Time Spent with Patient Acute/OP-Text       PT Time Spent With Patient Outpatient/Acute Entered On:  10/14/2019 10:33 EDT    Performed On:  10/14/2019 10:33 EDT by Jimmey Ralph, JOSEPH R               Time Spent With Patient   PT Treatment Time Comment :   Pt called to cancel and reports he has a virus.     PT Functional Training Time :   0 minutes   PTA Functional Training Units :   0 units   PT Total Timed Code Treatment Units :   0 units   PT Total Timed Code Min :   0    PT Total Treatment Time Acute/OP :   0    Jimmey Ralph, JOSEPH R - 10/14/2019 10:33 EDT

## 2019-10-14 NOTE — Progress Notes (Signed)
OT Time Spent with Patient Acute/OP-Text       OT Time Spent With Patient Outpatient/Acute Entered On:  10/14/2019 11:50 EDT    Performed On:  10/14/2019 11:50 EDT by PETIT, OT, CHRISTINA M               Time Spent With Patient   OT Treatment Time Comment :   Pt called to cancel appt.      OT Functional Training Time :   0 minutes   OT Total Timed Code Treatment Minutes :   0 minutes   OT Total Treatment Time :   0 minutes   PETIT, OT, CHRISTINA M - 10/14/2019 11:50 EDT

## 2019-10-18 ENCOUNTER — Other Ambulatory Visit: Payer: Self-pay | Admitting: Cardiovascular Disease

## 2020-05-08 ENCOUNTER — Other Ambulatory Visit: Payer: Self-pay | Admitting: Cardiovascular Disease

## 2020-05-08 MED ORDER — LISINOPRIL 5 MG PO TABS: 5.0000 mg | ORAL_TABLET | Freq: Every day | ORAL | 2 refills | Status: DC

## 2020-05-08 NOTE — Telephone Encounter (Signed)
Requested Prescriptions   Signed Prescriptions Disp Refills  . lisinopril (ZESTRIL) 5 MG tablet 90 tablet 2    Sig: Take 1 tablet (5 mg total) by mouth daily.    Authorizing Provider: Antonieta Iba    Ordering User: Kendrick Fries

## 2020-05-08 NOTE — Telephone Encounter (Signed)
*  STAT* If patient is at the pharmacy, call can be transferred to refill team.   1. Which medications need to be refilled? (please list name of each medication and dose if known) lisinopril 5 mg  2. Which pharmacy/location (including street and city if local pharmacy) is medication [facility]  3. Do they need a 30 day or 90 day supply? 90

## 2020-05-31 NOTE — Procedures (Signed)
Lumbar Transforaminal Epidural Steroid Injection        Patient:   Jesse Savage, Jesse Savage            MRN: [id number]            FIN: [id number]               Age:   59 years     Sex:  Male     DOB:  03-12-1961   Associated Diagnoses:   None   Author:   Rondall Allegra W-MD      Date: 05/31/2020    DX: Lumbar foraminal stenosis    Referral: Daine Gravel    Level: L2-L3    Side: Right      The procedure was explained to the patient and given the risk and benefits of this procedure.  Patient understood and wished to proceed.  A signed consent was obtained.  Patient was placed in a prone position in the fluoroscopy room.  The lumbar area was prepped and draped in the usual sterile fashion and sterility was maintained throughout the procedure.  The levels were identified under fluoroscopy and the skin overlying the levels were anesthetized with 3 cc of 1% lidocaine plain.  A 25-gauge 3-1/2 inch spinal needle was passed through the skin well and advanced in a ventral direction until the tip of the needle was properly placed in the superior posterior intervertebral foramen as confirmed by AP and lateral fluoroscopic views.  No blood was aspirated.  There was no CSF flow.  Following negative aspiration 1 mL of contrast dye was injected to produce an epidurogram.  Afterwards 1 mL of solution was injected at each level.  Each milliliter contained [10] milligrams of dexamethasone.  Afterwards the needles were removed and a dressing was applied to the site.  Patient was taken to the recovery room and monitored for 30 minutes and was discharged home in stable condition.  Patient was given a follow-up appointment.  Patient tolerated the procedure well.        Signature Line     Electronically Signed on 05/31/2020 02:16 PM EST   ________________________________________________   Rondall Allegra W-MD

## 2020-05-31 NOTE — H&P (Signed)
History & Physical  Pre-Procedure        Patient:   Jesse Savage, Jesse Savage            MRN: [id number]            FIN: [id number]               Age:   59 years     Sex:  Male     DOB:  Jan 21, 1961   Associated Diagnoses:   None   Author:   Rondall Allegra W-MD      Date: 05/31/2020    DX: Low back pain, lumbar foraminal stenosis sacroiliitis    PROCEDURE: SI joint injection a transforaminal epidural steroid injection      Patient is here for a therapeutic pain procedure with fluoroscopy.    Vitals: See nursing notes    Mental status: Alert and oriented x3    Cardiovascular: Regular rate and rhythm    Lungs: Clear to auscultation bilaterally    Neuro: Nonfocal    Assessment:  Patient here for therapeutic pain procedure.     Plan:  Will plan to proceed with scheduled pain procedure        Signature Line     Electronically Signed on 05/31/2020 02:17 PM EST   ________________________________________________   Rondall Allegra W-MD

## 2020-05-31 NOTE — Procedures (Signed)
Sacroiliac procedure note        Patient:   Jesse Savage, Jesse Savage            MRN: [id number]            FIN: [id number]               Age:   59 years     Sex:  Male     DOB:  08-25-1960   Associated Diagnoses:   None   Author:   Rondall Allegra W-MD      Date: 05/31/2020  Referred by: Daine Gravel   Diagnosis: Sacroiliitis  Procedure: Sacroiliac joint injection  Procedure Note: The patient comes to the [facility]. [facility] referred for a sacroiliac injection. The patient???s major complaint is that of pain around the SI joint.  After a pre-block discussion with the patient explaining the procedure, mechanism of action, hoped for benefit and rick involved including the risk of even permanent nerve damage or paralysis, informed consent was obtained. The patient was taken to the block suite and placed in a prone position. We then sterilely prepped and draped the area above the injection site. Using 2 cc???s of 1% Xylocaine a skin wheal and the subcutaneous tissues were infiltrated for a good local anesthetic effect. Then using an 25 ga spinal needle I introduced to the right sacroiliac joint guided by biplanar fluoroscopy. No blood was encountered. Then 80 mg of Depo-Medrol mixed in 1cc of .25% Marcaine was injected into the sacroiliac joint.  The patient is resting in the post block suite and understands to follow up as directed.      Signature Line     Electronically Signed on 05/31/2020 02:18 PM EST   ________________________________________________   Rondall Allegra W-MD

## 2020-05-31 NOTE — Op Note (Signed)
Phase II Record - SFPM             Phase II Record - SFPM Summary                                                                  Primary Physician:        Rondall Allegra W-MD    Case Number:              [id number]    Finalized Date/Time:      05/31/20 11:33:43    Pt. Name:                 Jesse Savage, Jesse Savage    D.O.B./Sex:               26-Apr-1961    Male    Med Rec #:                [id number]    Physician:                Rondall Allegra W-MD    Financial #:              [id number]    Pt. Type:                 O    Room/Bed:                 /    Admit/Disch:              05/31/20 09:54:00 -    Institution:       SFPM Case Time Phase II                                                                                                   Entry 1                                                                                                          Phase II In               05/31/20 11:15:00               Phase II Out                    05/31/20 11:25:00    Phase II Discharge        05/31/20 11:30:00  Time     Last Modified By:         Durene Cal RN, [facility]                              05/31/20 11:33:41      SFPM Case Time Phase II Audit                                                                    05/31/20 11:33:41         Owner: [email or web address]                             Modifier: [email or web address]                                                      <+> 1         Phase II Discharge Time                Finalized By: Durene Cal RN, Carlyn Reichert      Document Signatures                                                                             Signed By:           Weston Settle, Gavin Pound L 05/31/20 11:33

## 2020-05-31 NOTE — Nursing Note (Signed)
Nursing Discharge Summary - Text       Physician Discharge Summary Entered On:  05/31/2020 6:49 EST    Performed On:  05/31/2020 6:49 EST by Durene Cal, RN, [facility]               DC Information   Provider Instructions for Diet :   A Healthy Diet   Provider Instructions for Activity :   Gradually resume your normal activity, Limit your activity for 24hrs, May shower, No Baths/Hot Tubs/Oceans/ or Pools, No bending, No bending, twisting or lifting, No driving, No lifting   Durene Cal, RN, [facility] L - 05/31/2020 6:49 EST

## 2020-05-31 NOTE — Procedures (Signed)
Procedure Record - SFPM             Procedure Record - SFPM Summary                                                                 Primary Physician:        Rondall Allegra W-MD    Case Number:              [id number]    Finalized Date/Time:      05/31/20 11:13:59    Pt. Name:                 Jesse Savage, Jesse Savage    D.O.B./Sex:               16-Nov-1960    Male    Med Rec #:                [id number]    Physician:                Rondall Allegra W-MD    Financial #:              [id number]    Pt. Type:                 O    Room/Bed:                 /    Admit/Disch:              05/31/20 09:54:00 -    Institution:       [facility] - Case Attendance                                                                                                    Entry 1                         Entry 2                                                                          Case Attendee             RANDALL,  DERRICK W-MD          PYLE, RN, JOYCE    Role Performed            Surgeon Primary                 Circulator    Time In  05/31/20 11:03:00               05/31/20 11:03:00    Time Out                  05/31/20 11:13:00               05/31/20 11:13:00    Procedure                 Transforaminal Epidural                              Ster Inj, Si Joint                              Injection    Last Modified By:         Pricilla Riffle RN, Esmond Camper, RN, JOYCE                              05/31/20 11:13:55               05/31/20 11:13:55      SFPM - Case Attendance Audit                                                                     05/31/20 11:13:55         Owner: Majel Homer                                Modifier: [email or web address]                                                             1     <+> Time Out            1     <*> Procedure                              Transforaminal Epidural Ster Inj, Si Joint Injection        <+> 2         Time Out     05/31/20 11:11:08         Owner: Majel Homer                                 Modifier: [email or web address]  1     <*> Procedure                              Transforaminal Epidural Ster Inj, Si Joint Injection        <+> 2         Time In     05/31/20 11:09:10         Owner: PYLEJ                                Modifier: [email or web address]                                                             1     <+> Time In            1     <*> Procedure                              Transforaminal Epidural Ster Inj, Si Joint Injection        <+> 2         Case Attendee        <+> 2         Role Performed        SFPM - Case Times                                                                                                         Entry 1                                                                                                          Patient      In Room Time             05/31/20 11:03:00               Out Room Time                   05/31/20 11:13:00    Anesthesia     Procedure      Start Time               05/31/20 11:05:00               Stop  Time                       05/31/20 11:11:00    Last Modified By:         Pricilla Riffle RN, JOYCE                              05/31/20 11:13:55      SFPM - Case Times Audit                                                                          05/31/20 11:13:55         Owner: Majel Homer                                Modifier: [email or web address]                                                         <+> 1         Out Room Time     05/31/20 11:11:11         Owner: Majel Homer                                Modifier: [email or web address]                                                         <+> 1         Stop Time        SFPM - General Case Data                                                                                                  Entry 1  Case Information      ASA Class                N/A                             Case Level                       None     OR                       SF PM 01                        Specialty                       Pain Management (SN)     Wound Class              1-Clean    Preop Diagnosis           M51.36                          Postop Diagnosis                M51.36    Last Modified ByPricilla Riffle, RN, JOYCE                              05/31/20 11:08:14      SFPM - Procedures                                                                                                         Entry 1                         Entry 2                                                                          Procedure     Description      Procedure                Transforaminal Epidural         Si Joint Injection                              Ster Inj     Modifiers      Surgical Procedure       L2-3  RIGHT SI     Text     Primary Procedure         Yes                             No    Primary Surgeon           Rondall Allegra W-MD          [place],  DERRICK W-MD    Start                     05/31/20 11:05:00               05/31/20 11:05:00    Stop                      05/31/20 11:11:00               05/31/20 11:11:00    Anesthesia Type           Local                           Local    Surgical Service          Pain Management (SN)            Pain Management (SN)    Wound Class               1-Clean                         1-Clean    Last Modified By:         Pricilla Riffle RN, Esmond Camper, RN, JOYCE                              05/31/20 11:13:56               05/31/20 11:13:56      SFPM - Dressing/Packing                                                                                                   Entry 1                                                                                                          Site  Back    Dressing Item     Details      Dressing Item            Band-Aid     (Im.290)     Last Modified By:         Pricilla Riffle, RN, JOYCE                              05/31/20  11:07:49      SFPM - Procedure Setup                                                                                                    Entry 1                                                                                                          Body Position             Prone                           Prep Agents (Im.270)            Chlorhexidine Gluconate                                                                                              2% w/Alcohol    Skin Prep Agent Dry       Yes                             Equipment Used                  O2 Sat    Without Pooling     Vital signs               n/a    completed and     patient reassessed     prior to sedation     Last Modified By:         Pricilla Riffle, RN, Alona Bene  05/31/20 11:08:18      SFPM - Time Out - Procedure                                                                                               Entry 1                                                                                                          Procedure                 Transforaminal Epidural         Patient name and                Yes                              Ster Inj, Si Joint              DOB confirmed                               Injection    Surgical procedure        Yes                             Correct surgical                Yes    to be performed                                           site marked and     confirmed and                                             initials are     verified by                                               visible through     completed surgical  prepped and draped     consent                                                   field (or                                                               alternative ID band                                                               used), if applicable     Allergies discussed       Yes                             Anticoagulation                  Yes                                                              status confirmed     Time Out Complete         05/31/20 11:04:00    Last Modified By:         Pricilla Riffle, RN, JOYCE                              05/31/20 11:05:50      SFPM - Time Out - Procedure Audit                                                                05/31/20 11:05:50         Owner: Majel Homer                                Modifier: [email or web address]                                                             1     <*> Procedure                              Transforaminal Epidural Ster Inj, Si Joint  Injection            1     <*> Time Out Complete                      05/31/20 11:05:00        SFPM - Debrief - Procedure                                                                                                Entry 1                                                                                                          Procedure                 Transforaminal Epidural         Actual procedure                Yes                              Ster Inj, Si Joint              performed confirmed                               Injection    Confirm specimens         Yes                             Patient recovery                Yes    and specimens                                             plan confirmed     labeled     appropriately (if     applicable)     Debrief Complete          05/31/20 11:12:00    Last Modified By:         Pricilla Riffle, RN, JOYCE                              05/31/20 11:11:21      Case Comments                                                                                         <  None>              Finalized By: Pricilla Riffle RN, JOYCE      Document Signatures                                                                             Signed By:           Pricilla Riffle RN, JOYCE 05/31/20 11:13

## 2020-05-31 NOTE — Assessment & Plan Note (Signed)
Pre-Procedure Record - [facility]             Pre-Procedure Record - SFPM Summary                                                             Primary Physician:        Rondall Allegra W-MD    Case Number:              [id number]    Finalized Date/Time:      05/31/20 10:46:43    Pt. Name:                 Jesse Savage, Jesse Savage    D.O.B./Sex:               09-28-1960    Male    Med Rec #:                [id number]    Physician:                Rondall Allegra W-MD    Financial #:              [id number]    Pt. Type:                 O    Room/Bed:                 /    Admit/Disch:              05/31/20 09:54:00 -    Institution:       [facility] - Pre-Procedure - Case Times                                                                                         Entry 1                                                                                                          Patient In Room Time      05/31/20 10:40:00               Nurse In Time                   05/31/20 10:40:00    Nurse Out Time            05/31/20 10:46:00               Patient Ready for  05/31/20 10:46:00                                                              Surgery/Procedure     Last Modified By:         [name] RN, [name] A                              05/31/20 10:46:42      SFPM - Pre-Procedure - Case Times Audit                                                          05/31/20 10:46:42         Owner: Paulina Fusi                               Modifier: Paulina Fusi                                                        <+> 1         Patient Ready for Surgery/Procedure        <+> 1         Nurse Out Time                Finalized By: [name] RN, Solmon Ice      Document Signatures                                                                             Signed By:           [name] RN, [name] A 05/31/20 10:46

## 2020-05-31 NOTE — Nursing Note (Signed)
Nursing Discharge Summary - Text       Nursing Discharge Summary Entered On:  05/31/2020 6:50 EST    Performed On:  05/31/2020 6:50 EST by Durene Cal, RN, DEBORAH L               DC Information   Discharge To :   Home independently   Mode of Discharge :   Wheelchair   Transportation :   Private vehicle   Accompanied By :   Family member   [place], RN, [facility] L - 05/31/2020 6:50 EST

## 2020-07-03 NOTE — Nursing Note (Signed)
Adult Admission Assessment - Text       Perioperative Admission Assessment Entered On:  07/03/2020 13:37 EST    Performed On:  07/03/2020 13:35 EST by Ballard Russell, RN, MICHELE L               General   Information Given By :   Self   PAT Patient Procedure Verification :   Patient name and DOB confirmed with patient, Correct procedure scheduled confirmed with patient, Correct side/site confirmed with patient   Primary Care Physician/Specialists :   PCP - DR. Bishop Limbo, RN, MICHELE L - 07/03/2020 13:35 EST   Allergies   (As Of: 07/03/2020 13:37:14 EST)   Allergies (Active)   codeine  Estimated Onset Date:   Unspecified ; Reactions:   NA ; Created By:   Haynes Hoehn D; Reaction Status:   Active ; Category:   Drug ; Substance:   codeine ; Type:   Allergy ; Severity:   Unknown ; Updated By:   Haynes Hoehn D; Reviewed Date:   07/03/2020 13:35 EST      Cymbalta  Estimated Onset Date:   Unspecified ; Reactions:   NA ; Created By:   Haynes Hoehn D; Reaction Status:   Active ; Category:   Drug ; Substance:   Cymbalta ; Type:   Allergy ; Severity:   Unknown ; Updated By:   Haynes Hoehn D; Reviewed Date:   07/03/2020 13:35 EST      Neurontin  Estimated Onset Date:   Unspecified ; Reactions:   NA ; Created By:   Haynes Hoehn D; Reaction Status:   Active ; Category:   Drug ; Substance:   Neurontin ; Type:   Allergy ; Severity:   Unknown ; Updated By:   Haynes Hoehn D; Reviewed Date:   07/03/2020 13:35 EST        Medication History   Medication List   (As Of: 07/03/2020 13:37:14 EST)   Prescription/Discharge Order    amitriptyline  :   amitriptyline ; Status:   Prescribed ; Ordered As Mnemonic:   amitriptyline 25 mg oral tablet ; Simple Display Line:   25 mg, 1 tabs, Oral, Once a Day (at bedtime), 30 tabs, 0 Refill(s) ; Ordering Provider:   Alcide Clever M-MD; Catalog Code:   amitriptyline ; Order Dt/Tm:   09/06/2019 13:58:02 EDT          apixaban  :   apixaban ; Status:   Prescribed ; Ordered As Mnemonic:    apixaban 5 mg oral tablet ; Simple Display Line:   10 mg, 2 tabs, Oral, BID, 10mg  BID x 1 week (started 3/29), then 5mg  BID, 22 tabs, 0 Refill(s) ; Ordering Provider:   4/29 M-MD; Catalog Code:   apixaban ; Order Dt/Tm:   09/06/2019 13:58:02 EDT          apixaban  :   apixaban ; Status:   Prescribed ; Ordered As Mnemonic:   apixaban 5 mg oral tablet ; Simple Display Line:   5 mg, 1 tabs, Oral, BID, 60 tabs, 0 Refill(s) ; Ordering Provider:   Alcide Clever M-MD; Catalog Code:   apixaban ; Order Dt/Tm:   09/06/2019 13:58:02 EDT          docusate  :   docusate ; Status:   Prescribed ; Ordered As Mnemonic:   Colace 100 mg oral capsule ; Simple Display Line:   1  tabs, Oral, BID, 60 tabs, 1 Refill(s) ; Ordering Provider:   Alcide Clever M-MD; Catalog Code:   docusate ; Order Dt/Tm:   09/06/2019 13:58:02 EDT          famotidine  :   famotidine ; Status:   Prescribed ; Ordered As Mnemonic:   Pepcid 20 mg oral tablet ; Simple Display Line:   20 mg, 1 tabs, Oral, Daily, 30 tabs, 0 Refill(s) ; Ordering Provider:   Alcide Clever M-MD; Catalog Code:   famotidine ; Order Dt/Tm:   09/06/2019 13:58:02 EDT          senna  :   senna ; Status:   Prescribed ; Ordered As Mnemonic:   senna 8.6 mg oral tablet ; Simple Display Line:   17.2 mg, 2 tabs, Oral, Daily, 60 tabs, 0 Refill(s) ; Ordering Provider:   Alcide Clever M-MD; Catalog Code:   senna ; Order Dt/Tm:   09/06/2019 13:58:02 EDT            Home Meds    cyclobenzaprine  :   cyclobenzaprine ; Status:   Documented ; Ordered As Mnemonic:   Flexeril ; Simple Display Line:   Oral, TID, 0 Refill(s) ; Catalog Code:   cyclobenzaprine ; Order Dt/Tm:   07/03/2020 13:36:37 EST          acetaminophen-oxyCODONE  :   acetaminophen-oxyCODONE ; Status:   Documented ; Ordered As Mnemonic:   Percocet 5/325 oral tablet ; Simple Display Line:   2 tabs, Oral, q4hr, PRN: severe pain (8-10), 0 Refill(s) ; Ordering Provider:   Alcide Clever M-MD; Catalog Code:   acetaminophen-oxyCODONE ;  Order Dt/Tm:   09/06/2019 13:56:57 EDT          LORazepam  :   LORazepam ; Status:   Documented ; Ordered As Mnemonic:   LORazepam 1 mg oral tablet ; Simple Display Line:   0.5 mg, 0.5 tabs, Oral, Daily, PRN: anxiety, 0 Refill(s) ; Ordering Provider:   Alcide Clever M-MD; Catalog Code:   LORazepam ; Order Dt/Tm:   09/06/2019 13:25:31 EDT          polyethylene glycol 3350  :   polyethylene glycol 3350 ; Status:   Documented ; Ordered As Mnemonic:   MiraLax ; Simple Display Line:   17 g, 1 packets, Oral, Daily, PRN: constipation, 0 Refill(s) ; Ordering Provider:   Alcide Clever M-MD; Catalog Code:   polyethylene glycol 3350 ; Order Dt/Tm:   09/06/2019 13:25:44 EDT

## 2020-07-03 NOTE — Procedures (Signed)
Procedure Record - SFPM             Procedure Record - SFPM Summary                                                                 Primary Physician:        Rondall Allegra W-MD    Case Number:              [id number]    Finalized Date/Time:      07/03/20 13:59:57    Pt. Name:                 Jesse Savage, Jesse Savage    D.O.B./Sex:               1960/07/25    Male    Med Rec #:                [id number]    Physician:                Rondall Allegra W-MD    Financial #:              [id number]    Pt. Type:                 O    Room/Bed:                 /    Admit/Disch:              07/03/20 13:14:00 -    Institution:       [facility] - Case Attendance                                                                                                    Entry 1                                                                                                          Case Attendee             Rondall Allegra W-MD          Role Performed                  Surgeon Primary    Time In                   07/03/20 13:52:00  Procedure                       Lumbar Epidural Steroid                                                                                              Injection, Si Joint                                                                                              Injection(Bilateral)    Last Modified By:         Freida BusmanAllen, RN, Sandria BalesKhali A                              07/03/20 13:53:33    General Comments:            depo lot # [id number]..exp 11/22 and [id number] A.Marland Kitchen.exp10/23  isovue lot # [id number].Marland Kitchen.exp10/23      SFPM - Case Attendance Audit                                                                     07/03/20 13:53:33         Owner: ALLEKH                               Modifier: ALLEKH                                                            1     <*> Time In            1     <*> Procedure            1     <*> Procedure                              Lumbar Epidural Steroid Injection, Si Joint Injection(Bilateral)             1     <*> Procedure  Lumbar Epidural Steroid Injection, Si Joint Injection(Bilateral)        SFPM - Case Times                                                                                                         Entry 1                                                                                                          Patient      In Room Time             07/03/20 13:52:00               Out Room Time                   07/03/20 14:00:00    Anesthesia     Procedure      Start Time               07/03/20 13:53:00               Stop Time                       07/03/20 13:59:00    Last Modified By:         Darlys Gales A                              07/03/20 13:59:55      SFPM - Case Times Audit                                                                          07/03/20 13:59:55         Owner: ALLEKH                               Modifier: ALLEKH                                                        <+> 1  Out Room Time     07/03/20 13:59:45         Owner: [facility]                               ModifierPaulina Fusi                                                        <+> 1         Stop Time     07/03/20 13:53:36         Owner: ALLEKH                               Modifier: Paulina Fusi                                                        <+> 1         Start Time        SFPM - General Case Data                                                                                                  Entry 1                                                                                                          Case Information      ASA Class                N/A                             Case Level                      None     OR                       SF PM 01                        Specialty  Pain Management (SN)     Wound Class              1-Clean    Preop Diagnosis           M51.36                          Postop Diagnosis                M51.36    Last  Modified By:         [name], RN, [name] A                              07/03/20 13:53:46      SFPM - Procedures                                                                                                         Entry 1                         Entry 2                                                                          Procedure     Description      Procedure                Lumbar Epidural Steroid         Si Joint Injection                              Injection     Modifiers                                                Bilateral     Surgical Procedure       LESI L2/3                       SI JOINT     Text     Primary Procedure         Yes                             No    Primary Surgeon           RANDALL,  DERRICK W-MD          Brynda Greathouse,  DERRICK W-MD    Start  07/03/20 13:53:00               07/03/20 13:53:00    Stop                      07/03/20 13:59:00               07/03/20 13:59:00    Anesthesia Type           Local                           Local    Surgical Service          Pain Management (SN)            Pain Management (SN)    Wound Class               1-Clean                         1-Clean    Last Modified By:         [name], RN, Gwenyth Allegra, RN, Khali A                              07/03/20 13:59:52               07/03/20 13:59:52      SFPM - Procedures Audit                                                                          07/03/20 13:59:52         Owner: ALLEKH                               Modifier: ALLEKH                                                        <+> 1         Stop        <+> 2         Stop        SFPM - Dressing/Packing                                                                                                   Entry 1  Site                      Back                            Site Details                    Bilateral    Dressing  Item     Details      Dressing Item            Band-Aid     (Im.290)     Last Modified By:         [name], RN, [name] A                              07/03/20 13:55:33      SFPM - Procedure Setup                                                                                                    Entry 1                                                                                                          Body Position             Prone                           Prep Agents (Im.270)            Chlorhexidine Gluconate                                                                                              2% w/Alcohol    Skin Prep Agent Dry       Yes                             Equipment Used                  O2 Sat    Without Pooling     Vital signs  Yes    completed and     patient reassessed     prior to sedation     Last Modified By:         [name], RN, [name] A                              07/03/20 13:53:40      SFPM - Time Out - Procedure                                                                                               Entry 1                                                                                                          Procedure                 Lumbar Epidural Steroid         Patient name and                Yes                              Injection, Si Joint             DOB confirmed                               Injection(Bilateral)    Surgical procedure        Yes                             Correct surgical                Yes    to be performed                                           site marked and     confirmed and                                             initials are     verified by  visible through     completed surgical                                        prepped and draped     consent                                                   field (or                                                               alternative  ID band                                                               used), if applicable     Allergies discussed       Yes                             Anticoagulation                 Yes                                                              status confirmed     Time Out Complete         07/03/20 13:52:00    Last Modified By:         [name], RN, [name] A                              07/03/20 13:52:51      SFPM - Debrief - Procedure                                                                                                Entry 1  Procedure                 Lumbar Epidural Steroid         Actual procedure                Yes                              Injection, Si Joint             performed confirmed                               Injection(Bilateral)    Confirm specimens         Yes                             Patient recovery                Yes    and specimens                                             plan confirmed     labeled     appropriately (if     applicable)     Debrief Complete          07/03/20 13:59:00    Last Modified By:         [name] RN, [name] A                              07/03/20 13:59:52      Case Comments                                                                                         <None>              Finalized By: [name], RN, Solmon Ice      Document Signatures                                                                             Signed By:           [name] RN, Khali A 07/03/20 13:59

## 2020-07-03 NOTE — Op Note (Signed)
Phase II Record - SFPM             Phase II Record - SFPM Summary                                                                  Primary Physician:        Rondall Allegra W-MD    Case Number:              [id number]    Finalized Date/Time:      07/03/20 14:16:15    Pt. Name:                 Jesse Savage, Jesse Savage    D.O.B./Sex:               05/16/1961    Male    Med Rec #:                [id number]    Physician:                Rondall Allegra W-MD    Financial #:              [id number]    Pt. Type:                 O    Room/Bed:                 /    Admit/Disch:              07/03/20 13:14:00 -    Institution:       SFPM Case Time Phase II                                                                                                   Entry 1                                                                                                          Phase II In               07/03/20 14:00:00               Phase II Out                    07/03/20 14:11:00    Phase II Discharge        07/03/20 14:11:00  Time     Last Modified ByPricilla Riffle, RN, JOYCE                              07/03/20 14:16:14      SFPM Case Time Phase II Audit                                                                    07/03/20 14:16:14         Owner: Majel Homer                                Modifier: [email or web address]                                                         <+> 1         Phase II Out        <+> 1         Phase II Discharge Time                Finalized By: Pricilla Riffle RN, JOYCE      Document Signatures                                                                             Signed By:           Pricilla Riffle RN, JOYCE 07/03/20 14:16

## 2020-07-03 NOTE — Procedures (Signed)
Sacroiliac procedure note        Patient:   Jesse Savage, Jesse Savage            MRN: [id number]            FIN: [id number]               Age:   60 years     Sex:  Male     DOB:  [birth year]   Associated Diagnoses:   None   Author:   Rondall Allegra W-MD      Date: 07/03/2020  Referred by: Daine Gravel   Diagnosis: Sacroiliitis  Procedure: Sacroiliac joint injection  Procedure Note: The patient comes to the [facility]. [facility] referred for a sacroiliac injection. The patient???s major complaint is that of pain around the SI joint.  After a pre-block discussion with the patient explaining the procedure, mechanism of action, hoped for benefit and rick involved including the risk of even permanent nerve damage or paralysis, informed consent was obtained. The patient was taken to the block suite and placed in a prone position. We then sterilely prepped and draped the area above the injection site. Using 2 cc???s of 1% Xylocaine a skin wheal and the subcutaneous tissues were infiltrated for a good local anesthetic effect. Then using an 25 ga spinal needle I introduced to the right sacroiliac joint guided by biplanar fluoroscopy. No blood was encountered. Then 80 mg of Depo-Medrol mixed in 10cc of .25% Marcaine was injected into the sacroiliac joint.  The patient is resting in the post block suite and understands to follow up as directed.      Signature Line     Electronically Signed on 07/03/2020 02:54 PM EST   ________________________________________________   Rondall Allegra W-MD

## 2020-07-03 NOTE — H&P (Signed)
History & Physical  Pre-Procedure        Patient:   Jesse Savage, Jesse Savage            MRN: [id number]            FIN: [id number]               Age:   60 years     Sex:  Male     DOB:  07-08-1960   Associated Diagnoses:   None   Author:   Rondall Allegra W-MD      Date: 07/03/2020    DX: Sacroiliitis, lumbar stenosis    PROCEDURE: Lumbar epidural steroid injection and right SI joint injection      Patient is here for a therapeutic pain procedure with fluoroscopy.    Vitals: See nursing notes    Mental status: Alert and oriented x3    Cardiovascular: Regular rate and rhythm    Lungs: Clear to auscultation bilaterally    Neuro: Nonfocal    Assessment:  Patient here for therapeutic pain procedure.     Plan:  Will plan to proceed with scheduled pain procedure        Signature Line     Electronically Signed on 07/03/2020 02:53 PM EST   ________________________________________________   Rondall Allegra W-MD

## 2020-07-03 NOTE — Nursing Note (Signed)
Nursing Discharge Summary - Text       Physician Discharge Summary Entered On:  07/03/2020 12:02 EST    Performed On:  07/03/2020 12:02 EST by Durene Cal, RN, [facility] L               DC Information   Provider Instructions for Diet :   A Healthy Diet   Provider Instructions for Activity :   Gradually resume your normal activity, Limit your activity for 24hrs, May shower, No Baths/Hot Tubs/Oceans/ or Pools, No bending, No bending, twisting or lifting, No driving, No lifting   Durene Cal, RN, DEBORAH L - 07/03/2020 12:02 EST

## 2020-07-03 NOTE — Nursing Note (Signed)
Nursing Discharge Summary - Text       Nursing Discharge Summary Entered On:  07/03/2020 12:03 EST    Performed On:  07/03/2020 12:03 EST by Durene Cal, RN, DEBORAH L               DC Information   Discharge To :   Home independently   Mode of Discharge :   Wheelchair   Transportation :   Private vehicle   Accompanied By :   Family member   [place], RN, [facility] L - 07/03/2020 12:03 EST

## 2020-07-03 NOTE — Procedures (Signed)
LESI        Patient:   BRADELY, RUDIN            MRN: [id number]            FIN: [id number]               Age:   60 years     Sex:  Male     DOB:  02-13-1961   Associated Diagnoses:   None   Author:   Rondall Allegra W-MD      Date: 07/03/2020    Diagnosis: Lumbar stenosis    Referral: Daine Gravel    Procedure: Lumbar epidural steroid injection L2-L3      Procedure Note: The patient comes to the [facility]. [facility] for a lumbar epidural steroid injection.  The patient???s major complaint is that of low back pain.  After a  discussion with the patient explaining the procedure, mechanism of action, hoped for benefit and risk involved including the risk of even permanent nerve damage or paralysis, informed consent was obtained.  The patient was taken to the block suite and placed in a prone position.  We then sterilely prepped and draped the area above the injection site.  Using 2 cc???s of 1 % Xylocaine a skin wheal and the subcutaneous tissues were infiltrated for a good local anesthetic effect.  Then using an 18 gauge 3 1/2 inch Tuohy epidural needle I introduced it at the [L2-L3] level guided by biplanar fluoroscopy and loss of resistance technique.  Contrast was injected in the epidural space without vascular spread or intrathecal spread.  No blood, paresthesias or spinal fluid having been encountered 80 mg of Depo-Medrol mixed in 41ml of .25% Marcaine and 3 ml???s of normal saline was injected into the epidural space.  The patient is resting in the post block suite and understands to follow up as directed.        Signature Line     Electronically Signed on 07/03/2020 02:54 PM EST   ________________________________________________   Rondall Allegra W-MD

## 2020-07-03 NOTE — Assessment & Plan Note (Signed)
Pre-Procedure Record - [facility]             Pre-Procedure Record - [facility] Summary                                                             Primary Physician:        Rondall Allegra W-MD    Case Number:              [id number]    Finalized Date/Time:      07/03/20 13:41:50    Pt. Name:                 Jesse Savage, Jesse Savage    D.O.B./Sex:               07/29/1960    Male    Med Rec #:                [id number]    Physician:                Rondall Allegra W-MD    Financial #:              [id number]    Pt. Type:                 O    Room/Bed:                 /    Admit/Disch:              07/03/20 13:14:00 -    Institution:       [facility] - Pre-Procedure - Case Times                                                                                         Entry 1                                                                                                          Patient In Room Time      07/03/20 13:27:00               Nurse In Time                   07/03/20 13:27:00    Nurse Out Time            07/03/20 13:41:00               Patient Ready for  07/03/20 13:41:00                                                              Surgery/Procedure     Last Modified By:         Ballard Russell RN, MICHELE L                              07/03/20 13:41:47              Finalized By: Ballard Russell RN, MICHELE L      Document Signatures                                                                             Signed By:           Ballard Russell RN, MICHELE L 07/03/20 13:41

## 2020-08-29 ENCOUNTER — Encounter: Payer: Self-pay | Admitting: Orthopedic Surgery

## 2020-08-29 ENCOUNTER — Ambulatory Visit (INDEPENDENT_AMBULATORY_CARE_PROVIDER_SITE_OTHER): Payer: No Typology Code available for payment source

## 2020-08-29 ENCOUNTER — Ambulatory Visit (INDEPENDENT_AMBULATORY_CARE_PROVIDER_SITE_OTHER): Payer: No Typology Code available for payment source | Admitting: Orthopedic Surgery

## 2020-08-29 ENCOUNTER — Other Ambulatory Visit: Payer: Self-pay

## 2020-08-29 DIAGNOSIS — M25512 Pain in left shoulder: Secondary | ICD-10-CM

## 2020-08-29 DIAGNOSIS — G8929 Other chronic pain: Secondary | ICD-10-CM

## 2020-08-29 NOTE — Progress Notes (Signed)
Office Visit Note   Patient: Erik Perkins           Date of Birth: 1961/04/30           MRN: [id number] Visit Date: 08/29/2020 Requested by: [facility] [address] [place],  [place] [address] PCP: [facility]  Subjective: Chief Complaint  Patient presents with  . Left Shoulder - Pain    HPI: Erik Perkins is a 60 year old patient with left shoulder pain.  Date of injury 1/2 years ago when he was pulling on something at work.  Felt a pop in his shoulder.  Developed pain and weakness.  MRI scan done in October 2021 showed a full-thickness tear.  Do not have access to that report or the scan.  He has continued to work but he does report pain and weakness with movement.  He works as an Retail banker.  He is right-hand dominant.  Pain is in the 5 out of 10 range.  Patient has a history of MI with stent placement in 2011.  Has appointment with [facility] cardiologist in [facility] Dr. Mariah Perkins April 15.  Patient does take Plavix and aspirin among other medications.  He is here with his wife today who is extremely supportive.  And attentive.              ROS: All systems reviewed are negative as they relate to the chief complaint within the history of present illness.  Patient denies  fevers or chills.   Assessment & Plan: Visit Diagnoses:  1. Chronic left shoulder pain     Plan: Impression is left shoulder full-thickness rotator cuff tear.  Hard to say if this is a repairable tear.  He is getting a little bit of shoulder stiffness as well I would like for him to work on passive range of motion particularly in the supine position.  Working on abduction and forward flexion is demonstrated.  He plans to go and get the scan from the [place] or release the report so I can review it and then make operative plans from there.  Would like to have cardiac risk stratification with Dr. Mariah Perkins prior to surgery but currently he is having no real issues with any type of exertional chest pain or  any problems from a cardiac standpoint.  Anticipate that he will need to be off aspirin and Plavix for 5 to 7 days prior to elective surgery.  I will call him once I reviewed the scan.  Follow-Up Instructions: Return if symptoms worsen or fail to improve.   Orders:  Orders Placed This Encounter  Procedures  . XR Shoulder Left   No orders of the defined types were placed in this encounter.     Procedures: No procedures performed   Clinical Data: No additional findings.  Objective: Vital Signs: There were no vitals taken for this visit.  Physical Exam:   Constitutional: Patient appears well-developed HEENT:  Head: Normocephalic Eyes:EOM are normal Neck: Normal range of motion Cardiovascular: Normal rate Pulmonary/chest: Effort normal Neurologic: Patient is alert Skin: Skin is warm Psychiatric: Patient has normal mood and affect    Ortho Exam: Ortho exam demonstrates good cervical spine range of motion.  Left shoulder demonstrates 5 out of 5 grip EPL FPL interosseous are/extension bicep triceps and deltoid strength with palpable radial pulse.  Shoulder range of motion on the left passively is 60/80/110.  Patient has pretty reasonable external rotation strength at 15 degrees of abduction as well as subscap  strength.  Supraspinatus strength also pretty reasonable but very painful.  Does have a lot of coarse grinding and crepitus with active and passive range of motion of that left shoulder at 90 degrees of abduction.  No masses lymphadenopathy or skin changes noted in that shoulder girdle region.  We did try to call the [place] today but we were on hold for about 20 minutes and could not really reach anyone to try to have the report faxed.  For that reason we have requested the patient retrieve preferably the disc or at least the report of the scan.  Specialty Comments:  No specialty comments available.  Imaging: XR Shoulder Left  Result Date: 08/29/2020 AP outlet axillary left  shoulder reviewed.  Acromiohumeral distance normal.  Mild degenerative AC joint changes and no degenerative glenohumeral joint changes.  Shoulder is located.  No fracture.  Visualized lung fields clear.    PMFS History: Patient Active Problem List   Diagnosis Date Noted  . Atherosclerosis of native coronary artery of native heart with stable angina pectoris (HCC) 01/05/2017  . Essential hypertension 01/05/2017  . Stented coronary artery 08/08/2011  . Smoking hx 08/08/2011  . Hyperlipidemia 01/30/2010  . Coronary atherosclerosis 01/30/2010   Past Medical History:  Diagnosis Date  . Coronary artery disease    a. 01/2010 NSTEMI/PCI: Mild diff LDA/LCX dzs, moderate ostial D2/3 dzs. RCA 60-70p, 99d (3.5x12 Xience DES).  . Hyperlipidemia   . Hypertension   . MI (myocardial infarction) (HCC)    10/2004, 01/2010    Family History  Problem Relation Age of Onset  . Coronary artery disease Sister        stents placed  . Diabetes Sister   . Hyperlipidemia Sister   . Cancer Father        lung  . Hyperlipidemia Father     Past Surgical History:  Procedure Laterality Date  . CARDIAC CATHETERIZATION    . FRACTURE SURGERY Right    wrist  60yo  . VASECTOMY  1984   Social History   Occupational History  . Not on file  Tobacco Use  . Smoking status: Former Smoker    Packs/day: 1.00    Years: 25.00    Pack years: 25.00    Quit date: 01/01/2010    Years since quitting: 10.6  . Smokeless tobacco: Never Used  Vaping Use  . Vaping Use: Never used  Substance and Sexual Activity  . Alcohol use: Yes    Alcohol/week: 4.0 standard drinks    Types: 2 Cans of beer, 2 Standard drinks or equivalent per week    Comment: weekly  . Drug use: No  . Sexual activity: Yes    Birth control/protection: Surgical

## 2020-09-03 ENCOUNTER — Telehealth: Payer: Self-pay

## 2020-09-03 NOTE — Telephone Encounter (Signed)
Pt called asking if we received his Mri report.  And is there anything else he needs to do?  He would like a call back from [employer]

## 2020-09-04 NOTE — Telephone Encounter (Signed)
Holding for Lauren. ?

## 2020-09-05 NOTE — Telephone Encounter (Signed)
IC advised I do have it.  Patient wanting scan reviewed so we can get plan on how to move forward with surgery.

## 2020-09-05 NOTE — Assessment & Plan Note (Signed)
Pre-Procedure Record - [facility]             Pre-Procedure Record - [facility] Summary                                                             Primary Physician:        Jearld Adjutant B-MD    Case Number:              [id number]    Finalized Date/Time:      09/05/20 10:38:16    Pt. Name:                 Jesse Savage, Jesse Savage    D.O.B./Sex:               10-08-1960    Male    Med Rec #:                [id number]    Physician:                Neomia Dear    Financial #:              [id number]    Pt. Type:                 O    Room/Bed:                 /    Admit/Disch:              09/05/20 10:03:00 -    Institution:       [facility] Case Attendance - Pre-Procedure                                                                                      Entry 1                                                                                                          Case Attendee             HARDIN, RN, PAMELA S-RN         Role Performed                  Monitoring RN    Last Modified By:         Dorethea Clan, RN, PAMELA S-RN                              09/05/20  10:16:50      [facility] - Case Times - Pre-Procedure                                                                                         Entry 1                                                                                                          Patient In Room Time      09/05/20 10:15:00               Nurse In Time                   09/05/20 10:17:00    Nurse Out Time            09/05/20 10:38:00               Patient Ready for               09/05/20 10:38:00                                                              Surgery/Procedure     Last Modified By:         Dorethea Clan RN, PAMELA S-RN                              09/05/20 10:38:11              Finalized By: Laural Benes, PAMELA S-RN      Document Signatures                                                                             Signed By:           Dorethea Clan RN, PAMELA S-RN 09/05/20 10:38

## 2020-09-05 NOTE — Nursing Note (Signed)
Nursing Discharge Summary - Text       Physician Discharge Summary Entered On:  09/05/2020 12:33 EDT    Performed On:  09/05/2020 12:32 EDT by Dorethea Clan, RN, PAMELA S-RN               DC Information   Provider Instructions for Diet :   A Healthy Diet   Provider Instructions for Activity :   As Tolerated   HARDIN, RN, PAMELA S-RN - 09/05/2020 12:32 EDT

## 2020-09-05 NOTE — Nursing Note (Signed)
Adult Admission Assessment - Text       Perioperative Admission Assessment Entered On:  09/05/2020 10:31 EDT    Performed On:  09/05/2020 10:25 EDT by Dorethea Clan, RN, PAMELA S-RN               General   Information Given By :   Self   Height/Length Estimated :   175.3 cm(Converted to: 69.02 in)    Weight   Estimated :   77.1 kg(Converted to: 169.976 lb)    Body Mass Index Estimated :   25.09 kg/m2   PAT Patient Procedure Verification :   Patient name and DOB confirmed with patient, Correct procedure scheduled confirmed with patient   Primary Care Physician/Specialists :   PCP - Jesse Savage   Day of Proc Supp Prsn is the Emerg Cont :   No   PAT Patient/Procedure Verification :   Jesse Savage   Emergency Contact Phone :   [phone]   Languages :   English   Preferred Communication Mode :   Verbal   HARDIN, RN, PAMELA S-RN - 09/05/2020 10:25 EDT   Allergies   (As Of: 09/05/2020 10:31:20 EDT)   Allergies (Active)   codeine  Estimated Onset Date:   Unspecified ; Reactions:   NA ; Created By:   Haynes Hoehn D; Reaction Status:   Active ; Category:   Drug ; Substance:   codeine ; Type:   Allergy ; Severity:   Unknown ; Updated By:   Haynes Hoehn D; Reviewed Date:   09/05/2020 10:26 EDT      Cymbalta  Estimated Onset Date:   Unspecified ; Reactions:   NA ; Created By:   Haynes Hoehn D; Reaction Status:   Active ; Category:   Drug ; Substance:   Cymbalta ; Type:   Allergy ; Severity:   Unknown ; Updated By:   Haynes Hoehn D; Reviewed Date:   09/05/2020 10:26 EDT      Neurontin  Estimated Onset Date:   Unspecified ; Reactions:   NA ; Created By:   Haynes Hoehn D; Reaction Status:   Active ; Category:   Drug ; Substance:   Neurontin ; Type:   Allergy ; Severity:   Unknown ; Updated By:   Haynes Hoehn D; Reviewed Date:   09/05/2020 10:26 EDT        Medication History   Medication List   (As Of: 09/05/2020 10:31:20 EDT)   Normal Order    Sodium Chloride 0.9% intravenous solution 500 mL  :   Sodium Chloride 0.9%  intravenous solution 500 mL ; Status:   Ordered ; Ordered As Mnemonic:   Sodium Chloride 0.9% 500 mL ; Simple Display Line:   30 mL/hr, IV ; Ordering Provider:   YOUNG,  SCOTT Savage; Catalog Code:   Sodium Chloride 0.9% ; Order Dt/Tm:   08/27/2020 13:24:53 EDT ; Comment:   Perioperative use ONLY            Prescription/Discharge Order    amitriptyline  :   amitriptyline ; Status:   Completed ; Ordered As Mnemonic:   amitriptyline 25 mg oral tablet ; Simple Display Line:   25 mg, 1 tabs, Oral, Once a Day (at bedtime), 30 tabs, 0 Refill(s) ; Ordering Provider:   Alcide Clever Savage; Catalog Code:   amitriptyline ; Order Dt/Tm:   09/06/2019 13:58:02 EDT          apixaban  :  apixaban ; Status:   Completed ; Ordered As Mnemonic:   apixaban 5 mg oral tablet ; Simple Display Line:   10 mg, 2 tabs, Oral, BID, 10mg  BID x 1 week (started 3/29), then 5mg  BID, 22 tabs, 0 Refill(s) ; Ordering Provider:   4/29 Savage; Catalog Code:   apixaban ; Order Dt/Tm:   09/06/2019 13:58:02 EDT          apixaban  :   apixaban ; Status:   Completed ; Ordered As Mnemonic:   apixaban 5 mg oral tablet ; Simple Display Line:   5 mg, 1 tabs, Oral, BID, 60 tabs, 0 Refill(s) ; Ordering Provider:   Alcide Clever Savage; Catalog Code:   apixaban ; Order Dt/Tm:   09/06/2019 13:58:02 EDT          docusate  :   docusate ; Status:   Prescribed ; Ordered As Mnemonic:   Colace 100 mg oral capsule ; Simple Display Line:   1 tabs, Oral, BID, 60 tabs, 1 Refill(s) ; Ordering Provider:   Alcide Clever Savage; Catalog Code:   docusate ; Order Dt/Tm:   09/06/2019 13:58:02 EDT          famotidine  :   famotidine ; Status:   Completed ; Ordered As Mnemonic:   Pepcid 20 mg oral tablet ; Simple Display Line:   20 mg, 1 tabs, Oral, Daily, 30 tabs, 0 Refill(s) ; Ordering Provider:   Alcide Clever Savage; Catalog Code:   famotidine ; Order Dt/Tm:   09/06/2019 13:58:02 EDT          senna  :   senna ; Status:   Completed ; Ordered As Mnemonic:   senna 8.6 mg oral  tablet ; Simple Display Line:   17.2 mg, 2 tabs, Oral, Daily, 60 tabs, 0 Refill(s) ; Ordering Provider:   Alcide Clever Savage; Catalog Code:   senna ; Order Dt/Tm:   09/06/2019 13:58:02 EDT            Home Meds    ibuprofen  :   ibuprofen ; Status:   Documented ; Ordered As Mnemonic:   ibuprofen ; Simple Display Line:   0 Refill(s) ; Catalog Code:   ibuprofen ; Order Dt/Tm:   09/05/2020 10:28:45 EDT          cyclobenzaprine  :   cyclobenzaprine ; Status:   Documented ; Ordered As Mnemonic:   Flexeril ; Simple Display Line:   Oral, TID, 0 Refill(s) ; Catalog Code:   cyclobenzaprine ; Order Dt/Tm:   07/03/2020 13:36:37 EST          acetaminophen-oxyCODONE  :   acetaminophen-oxyCODONE ; Status:   Documented ; Ordered As Mnemonic:   Percocet 5/325 oral tablet ; Simple Display Line:   2 tabs, Oral, q4hr, PRN: severe pain (8-10), 0 Refill(s) ; Ordering Provider:   09/07/2020 Savage; Catalog Code:   acetaminophen-oxyCODONE ; Order Dt/Tm:   09/06/2019 13:56:57 EDT          LORazepam  :   LORazepam ; Status:   Completed ; Ordered As Mnemonic:   LORazepam 1 mg oral tablet ; Simple Display Line:   0.5 mg, 0.5 tabs, Oral, Daily, PRN: anxiety, 0 Refill(s) ; Ordering Provider:   Alcide Clever Savage; Catalog Code:   LORazepam ; Order Dt/Tm:   09/06/2019 13:25:31 EDT          polyethylene glycol 3350  :  polyethylene glycol 3350 ; Status:   Completed ; Ordered As Mnemonic:   MiraLax ; Simple Display Line:   17 g, 1 packets, Oral, Daily, PRN: constipation, 0 Refill(s) ; Ordering Provider:   Alcide CleverNOVAN,  JAYNE Savage; Catalog Code:   polyethylene glycol 3350 ; Order Dt/Tm:   09/06/2019 13:25:44 EDT            Problem History   (As Of: 09/05/2020 10:31:21 EDT)   Problems(Active)    Acute myelopathy (SNOMED CT  :1610960454620-260-6843 )  Name of Problem:   Acute myelopathy ; Recorder:   Jesse Savage; Confirmation:   Confirmed ; Classification:   Medical ; Code:   [id number] ; Contributor System:   [employer] ; Last Updated:   08/24/2019 9:04  EDT ; Life Cycle Status:   Active ; Responsible Provider:   Demetrius RevelLEWEY,  JENNIFER Savage; Vocabulary:   SNOMED CT        Back pain (SNOMED CT  :[id number] )  Name of Problem:   Back pain ; Recorder:   PYLE, RN, JOYCE; Confirmation:   Confirmed ; Classification:   Patient Stated ; Code:   [id number] ; Contributor System:   [facility] ; Last Updated:   06/28/2019 11:25 EST ; Life Cycle Date:   06/28/2019 ; Life Cycle Status:   Active ; Vocabulary:   SNOMED CT        OSA (obstructive sleep apnea) (SNOMED CT  :952841324129889015 )  Name of Problem:   OSA (obstructive sleep apnea) ; Recorder:   PYLE, RN, JOYCE; Confirmation:   Confirmed ; Classification:   Patient Stated ; Code:   [id number] ; Contributor System:   PowerChart ; Last Updated:   06/28/2019 11:25 EST ; Life Cycle Date:   06/28/2019 ; Life Cycle Status:   Active ; Vocabulary:   SNOMED CT        Sciatica (SNOMED CT  :[id number] )  Name of Problem:   Sciatica ; Recorder:   PYLE, RN, JOYCE; Confirmation:   Confirmed ; Classification:   Patient Stated ; Code:   [id number] ; Contributor System:   PowerChart ; Last Updated:   06/28/2019 11:25 EST ; Life Cycle Date:   06/28/2019 ; Life Cycle Status:   Active ; Vocabulary:   SNOMED CT          Procedure History        -    Procedure History   (As Of: 09/05/2020 10:31:21 EDT)     Anesthesia Minutes:   0 ; Procedure Name:   Hernia ; Procedure Minutes:   0 ; Last Reviewed Dt/Tm:   09/05/2020 10:29:36 EDT            Anesthesia Minutes:   0 ; Procedure Name:   Tonsillectomy ; Procedure Minutes:   0 ; Last Reviewed Dt/Tm:   09/05/2020 10:29:36 EDT            Procedure Dt/Tm:   06/29/2019 11:32:00 EST ; Location[facility]   [facility] ; Provider:   Anda LatinaANDALL-MD,  DERRICK W; Anesthesia Type:   Local ; Anesthesia Minutes:   0 ; Procedure Name:   Transforaminal Epidural Ster Inj ; Procedure Minutes:   2 ; Comments:     06/29/2019 11:34 EST - Gertie BaronKIERSPE, RN, [name] A  auto-populated from documented surgical case ; Clinical Service:   Surgery ; Last Reviewed Dt/Tm:    09/05/2020 10:29:36 EDT            Procedure Dt/Tm:   [id number] ;  Anesthesia Minutes:   0 ; Procedure Name:   thumb surgery ; Procedure Minutes:   0 ; Last Reviewed Dt/Tm:   09/05/2020 10:29:36 EDT            Procedure Dt/Tm:   04/22/2011 ; Anesthesia Minutes:   0 ; Procedure Name:   Lumbar fusion L4-5 ; Procedure Minutes:   0 ; Last Reviewed Dt/Tm:   09/05/2020 10:29:36 EDT            Procedure Dt/Tm:   1610 ; Anesthesia Minutes:   0 ; Procedure Name:   tonsils and adenoids ; Procedure Minutes:   0 ; Last Reviewed Dt/Tm:   09/05/2020 10:29:36 EDT            Procedure Dt/Tm:   2012 ; Anesthesia Minutes:   0 ; Procedure Name:   redo lumbar fusion L5-S1 ; Procedure Minutes:   0 ; Last Reviewed Dt/Tm:   09/05/2020 10:29:36 EDT            Anesthesia Minutes:   0 ; Procedure Name:   colonoscopy ; Procedure Minutes:   0 ; Last Reviewed Dt/Tm:   09/05/2020 10:29:36 EDT            Procedure Dt/Tm:   9604 ; Anesthesia Minutes:   0 ; Procedure Name:   facial surgery ; Procedure Minutes:   0 ; Last Reviewed Dt/Tm:   09/05/2020 10:29:36 EDT            Procedure Dt/Tm:   2005 ; Anesthesia Minutes:   0 ; Procedure Name:   L5-S1 lumbar fusion ; Procedure Minutes:   0 ; Last Reviewed Dt/Tm:   09/05/2020 10:29:36 EDT            Procedure Dt/Tm:   08/26/2019 08:40:00 EDT ; Location:   [facility] ; Provider:   Alcide Goodness,  CURTIS-MD; Anesthesia Type:   General ; :   Huel Cote L-MD; Anesthesia Minutes:   0 ; Procedure Name:   Anterior Cervical Discectomy with Fusion and/or Stabilization SCIP ; Procedure Minutes:   166 ; Comments:     08/26/2019 11:38 EDT - [name], RN, [name]  auto-populated from documented surgical case ; Clinical Service:   Surgery ; Last Reviewed Dt/Tm:   09/05/2020 10:29:36 EDT            Procedure Dt/Tm:   05/31/2020 11:05:00 EST ; Location:   [facility] ; Provider:   Rondall Allegra W-MD; Anesthesia Type:   Local ; Anesthesia Minutes:   0 ; Procedure Name:   Transforaminal Epidural Ster Inj ; Procedure Minutes:    6 ; Comments:     05/31/2020 11:13 EST - PYLE, RN, JOYCE  auto-populated from documented surgical case ; Clinical Service:   Surgery ; Last Reviewed Dt/Tm:   09/05/2020 10:29:36 EDT            Procedure Dt/Tm:   05/31/2020 11:05:00 EST ; Location:   [facility] ; Provider:   Rondall Allegra W-MD; Anesthesia Type:   Local ; Anesthesia Minutes:   0 ; Procedure Name:   Si Joint Injection ; Procedure Minutes:   6 ; Comments:     05/31/2020 11:13 EST - PYLE, RN, JOYCE  auto-populated from documented surgical case ; Clinical Service:   Surgery ; Last Reviewed Dt/Tm:   09/05/2020 10:29:36 EDT            Procedure Dt/Tm:   07/03/2020 13:53:00 EST ; Location:  [facility] ; Provider:   Rondall Allegra W-MD; Anesthesia Type:   Local ; Anesthesia Minutes:   0 ; Procedure Name:   Lumbar Epidural Steroid Injection ; Procedure Minutes:   6 ; Comments:     07/03/2020 13:59 EST - [name], RN, [name] A  auto-populated from documented surgical case ; Clinical Service:   Surgery ; Last Reviewed Dt/Tm:   09/05/2020 10:29:36 EDT            Procedure Dt/Tm:   07/03/2020 13:53:00 EST ; Location:   [facility] ; Provider:   Rondall Allegra W-MD; Anesthesia Type:   Local ; Anesthesia Minutes:   0 ; Procedure Name:   Si Joint Injection (Bilateral) ; Procedure Minutes:   6 ; Comments:     07/03/2020 13:59 EST - [name], RN, [name] A  auto-populated from documented surgical case ; Clinical Service:   Surgery ; Last Reviewed Dt/Tm:   09/05/2020 10:29:36 EDT            History Confirmation   Problem History Changes PAT :   Yes   Procedure History Changes PAT :   No   HARDIN, RN, PAMELA S-RN - 09/05/2020 10:25 EDT   Anesthesia/Sedation   Anesthesia History :   Prior general anesthesia   Previous Problem with Anesthesia :   None   Moderate Sedation History :   Prior sedation for procedure   Previous Problem With Sedation :   None   HARDIN, RN, PAMELA S-RN - 09/05/2020 10:25 EDT   Bloodless Medicine   Is Blood Transfusion Acceptable to  Patient :   Yes   HARDIN, RN, PAMELA S-RN - 09/05/2020 10:25 EDT   ID Risk Screen Symptoms   Recent Travel History :   No recent travel   Close Contact with COVID-19 ID :   No   Last 14 days COVID-19 ID :   No   TB Symptom Screen :   No symptoms   C. diff Symptom/History ID :   Neither of the above   Patient Pregnant :   None of the above   HARDIN, RN, PAMELA S-RN - 09/05/2020 10:25 EDT   Social History   Social History   (As Of: 09/05/2020 10:31:21 EDT)   Tobacco:        Tobacco use: Never (less than 100 in lifetime).   (Last Updated: 08/25/2019 09:36:30 EDT by Jesse Revel Savage)          Electronic Cigarette/Vaping:        Never Electronic Cigarette Use.   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

## 2020-09-05 NOTE — H&P (Signed)
Preoperative H&P: General *        Patient:   Jesse Savage, Jesse Savage            MRN: [id number]            FIN: [id number]               Age:   60 years     Sex:  Male     DOB:  03-Jan-1961   Associated Diagnoses:   None   Author:   Lynden Oxford W-AGNP      Preoperative Information   Lumbago      Review of Systems   Constitutional:  Negative.    Respiratory:  Negative.    Cardiovascular:  Negative.    Musculoskeletal:       Back pain: On the right side, In the lower region.       Health Status   Allergies:    Allergic Reactions (Selected)  Unknown  Codeine- Na.  Cymbalta- Na.  Neurontin- Na.,    Allergies (3) Active Reaction  codeine NA  Cymbalta NA  Neurontin NA     Current medications:  (Selected)   Inpatient Medications  Ordered  Sodium Chloride 0.9% 500 mL: 30 mL/hr, IV  Prescriptions  Prescribed  Colace 100 mg oral capsule: 1 tabs, Oral, BID, 60 tabs, 1 Refill(s)  Documented Medications  Documented  Flexeril: Oral, TID, 0 Refill(s)  Percocet 5/325 oral tablet: 2 tabs, Oral, q4hr, PRN: severe pain (8-10), 0 Refill(s)  ibuprofen: 0 Refill(s),    Home Medications (4) Active  Colace 100 mg oral capsule 1 tabs, Oral, BID  Flexeril , Oral, TID  ibuprofen   Percocet 5/325 oral tablet 2 tabs, PRN, Oral, q4hr        Physical Examination   Reviewed Results: Vital Signs(Date Range: 09/04/2020 0:00 EDT - 09/05/2020 11:54 EDT)   General:  Alert and oriented, No acute distress.    Respiratory:  Respirations are non-labored.    Cardiovascular:  Normal rate, Regular rhythm.    Psychiatric:  Cooperative, Appropriate mood & affect.       Review / Management   Results review:  Lab results: 09/05/2020 10:16 EDT      Estimated Creatinine Clearance            98.24 mL/min  .       Impression and Plan   Lumbar myelogram  Patient has been assessed and is stable and appropriate for procedure.   Signature Line     Electronically Signed on 09/05/2020 11:55 AM EDT   ________________________________________________   Lynden Oxford W-AGNP

## 2020-09-05 NOTE — Nursing Note (Signed)
Nursing Discharge Summary - Text       Nursing Discharge Summary Entered On:  09/05/2020 12:32 EDT    Performed On:  09/05/2020 12:32 EDT by Dorethea Clan, RN, PAMELA S-RN               DC Information   Discharge To :   Home independently   Mode of Discharge :   Wheelchair   Dorethea Clan RN, [place] S-RN - 09/05/2020 12:32 EDT

## 2020-09-05 NOTE — Op Note (Signed)
Recovery Phase III Record - [facility]             Recovery Phase III Record - [facility] Summary                                                        Primary Physician:        Jearld Adjutant B-MD    Case Number:              [id number]    Finalized Date/Time:      09/05/20 15:37:44    Pt. Name:                 Jesse Savage, Jesse Savage    D.O.B./Sex:               [birth year]    Male    Med Rec #:                [id number]    Physician:                Neomia Dear    Financial #:              [id number]    Pt. Type:                 O    Room/Bed:                 /    Admit/Disch:              09/05/20 10:03:00 -    Institution:       [facility] Case Attendance - Phase III                                                                                          Entry 1                         Entry 2                                                                          Case Attendee             HARDIN, RN, PAMELA S-RN         Duplessis, RN, Kristen A    Role Performed            Monitoring RN                   Monitoring RN    Time In     Time Out     Last Modified By:         Dorethea Clan, RN, PAMELA  S-RN         Duplessis, RN, Kristen                              09/05/20 12:40:23               A 09/05/20 15:06:13      [facility] - Case Times - Phase III                                                                                             Entry 1                                                                                                          Phase III In              09/05/20 12:40:00               Phase III Out                   09/05/20 15:37:00    Phase III Discharge       09/05/20 15:37:00    Time     Last Modified By:         Eulogio Ditch RN, Kristen                              A 09/05/20 15:37:41              Finalized By: Eulogio Ditch RN, Kristen A      Document Signatures                                                                             Signed By:           Eulogio Ditch RN, Kristen A 09/05/20 15:37

## 2020-09-06 NOTE — Telephone Encounter (Signed)
I reviewed the scan.  He has a medium size supraspinatus tear.  Called and discussed with him operative treatment options.  Rehab risks and benefits.  He wants to go ahead and get surgery done sometime after April 15.  Blue sheet provided.  Thank you

## 2020-09-20 NOTE — Progress Notes (Signed)
Cardiology Office Note  Date:  09/21/2020   ID:  Erik Perkins, DOB 11-20-1960, MRN [id number]  PCP:  [facility]   Chief Complaint  Patient presents with  . Follow-up    12 month F/U-No new cardiac concerns    HPI:  Erik Perkins is a very pleasant 60 year old gentleman with a long history of  Former smoker hyperlipidemia,  hypertension  [facility] on January 21 2010 with stuttering worsening chest discomfort,  troponin elevation 0.33,  cardiac catheterization showing severe 99% distal RCA disease.  DES stent was placed . 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  No significant ST-T wave changes  Other past medical history reviewed Cardiac catheterization showed 99% distal RCA disease, moderate to severe proximal RCA disease estimated at 60-70%, mild diffuse LAD and left circumflex disease, moderate ostial D2 and D3 disease, moderate distal inferior wall hypokinesis on LV gram. A Xience 3.5 x 12 mm DES stent was placed to his distal RCA.}  PMH:   has a past medical history of Coronary artery disease, Hyperlipidemia, Hypertension, and MI (myocardial infarction) (HCC).  PSH:    Past Surgical History:  Procedure Laterality Date  . CARDIAC CATHETERIZATION    . FRACTURE SURGERY Right    wrist  60yo  . VASECTOMY  1984    Current Outpatient Medications  Medication  Sig Dispense Refill  . aspirin 81 MG tablet Take 81 mg by mouth daily.    . benzonatate (TESSALON) 100 MG capsule Take by mouth 2 (two) times daily as needed for cough.    . clopidogrel (PLAVIX) 75 MG tablet TAKE 1 TABLET DAILY 90 tablet 3  . furosemide (LASIX) 20 MG tablet Take 1 tablet (20 mg total) by mouth daily as needed. 90 tablet 3  . lisinopril (ZESTRIL) 5 MG tablet Take 1 tablet (5 mg total) by mouth daily. 90 tablet 2  . metoprolol tartrate (LOPRESSOR) 25 MG tablet Take 0.5 tablets (12.5 mg total) by mouth 2 (two) times daily. 180 tablet 3  . nitroGLYCERIN (NITROSTAT) 0.4 MG SL tablet Place 1 tablet (0.4 mg total) under the tongue every 5 (five) minutes as needed for chest pain. 25 tablet 0  . omeprazole (PRILOSEC) 20 MG capsule Take 20 mg by mouth 2 (two) times daily.    . potassium chloride SA (K-DUR,KLOR-CON) 20 MEQ tablet Take 1 tablet (20 mEq total) by mouth daily as needed. 90 tablet 3  . rosuvastatin (CRESTOR) 40 MG tablet TAKE 1 TABLET DAILY 90 tablet 3  . tamsulosin (FLOMAX) 0.4 MG CAPS capsule Take 1 capsule (0.4 mg total) by mouth 2 (two) times daily.     No current facility-administered medications for this visit.     Allergies:   Aspartame and Other   Social History:  The patient  reports that he quit smoking about 10 years ago. He  has a 25.00 pack-year smoking history. He has never used smokeless tobacco. He reports current alcohol use of about 4.0 standard drinks of alcohol per week. He reports that he does not use drugs.   Family History:   family history includes Cancer in his father; Coronary artery disease in his sister; Diabetes in his sister; Hyperlipidemia in his father and sister.    Review of Systems: Review of Systems  Constitutional: Negative.   HENT: Negative.   Respiratory: Negative.   Cardiovascular: Negative.   Gastrointestinal: Negative.   Musculoskeletal: Negative.   Neurological: Negative.   Psychiatric/Behavioral: Negative.   All other  systems reviewed and are negative.   PHYSICAL EXAM: VS:  BP 120/76 (BP Location: Left Arm, Patient Position: Sitting, Cuff Size: Large)   Pulse 64   Ht 5\' 10"  (1.778 m)   Wt 223 lb (101.2 kg)   SpO2 98%   BMI 32.00 kg/m  , BMI Body mass index is 32 kg/m. GEN: Well nourished, well developed, in no acute distress  HEENT: normal  Neck: no JVD, carotid bruits, or masses Cardiac: RRR; no murmurs, rubs, or gallops,no edema  Respiratory:  clear to auscultation bilaterally, normal work of breathing GI: soft, nontender, nondistended, + BS MS: no deformity or atrophy  Skin: warm and dry, no rash Neuro:  Strength and sensation are intact Psych: euthymic mood, full affect   Recent Labs: No results found for requested labs within last 8760 hours.    Lipid Panel No results found for: CHOL, HDL, LDLCALC, TRIG    Wt Readings from Last 3 Encounters:  09/21/20 223 lb (101.2 kg)  09/19/19 222 lb (100.7 kg)  01/17/19 215 lb (97.5 kg)      ASSESSMENT AND PLAN:  Preop cardiovascular evaluation Acceptable risk for shoulder surgery, no further testing needed He will hold his Plavix, stay on low-dose aspirin  Atherosclerosis of native coronary artery of native heart with angina pectoris (HCC) -  Denies angina, no further testing needed Stay on aspirin, can hold Plavix  Essential hypertension Blood pressure is well controlled on today's visit. No changes made to the medications.  Lower extremity edema Does not take Lasix, minimal edema, likely venous insufficiency  Smoking hx Stop smoking years ago  Hyperlipidemia Stay on Crestor 40, may need to add Zetia if repeat lab work with the 03/19/19 shows LDL above goal Goal less than 70 preferably 60  Neuropathy Etiology unclear, nondiabetic On hard concrete for many years, planning on retiring   Total encounter time more than 25 minutes  Greater than 50% was spent in counseling and coordination of care with the patient    No orders  of the defined types were placed in this encounter.     Signed, [place], M.D., Ph.D. 09/21/2020  [facility] [place], [place]

## 2020-09-21 ENCOUNTER — Ambulatory Visit (INDEPENDENT_AMBULATORY_CARE_PROVIDER_SITE_OTHER): Admitting: Cardiovascular Disease

## 2020-09-21 ENCOUNTER — Other Ambulatory Visit: Payer: Self-pay

## 2020-09-21 ENCOUNTER — Encounter: Payer: Self-pay | Admitting: Cardiovascular Disease

## 2020-09-21 VITALS — BP 120/76 | HR 64 | Ht 70.0 in | Wt 223.0 lb

## 2020-09-21 DIAGNOSIS — I25118 Atherosclerotic heart disease of native coronary artery with other forms of angina pectoris: Secondary | ICD-10-CM

## 2020-09-21 DIAGNOSIS — E782 Mixed hyperlipidemia: Secondary | ICD-10-CM

## 2020-09-21 DIAGNOSIS — I10 Essential (primary) hypertension: Secondary | ICD-10-CM

## 2020-09-21 NOTE — Patient Instructions (Addendum)
Cholesterol Goal LDL <70 (pref 60)  May need to add zetia if elevated  Medication Instructions:  Ok to stop plavix  If you need a refill on your cardiac medications before your next appointment, please call your pharmacy.    Lab work: No new labs needed   Testing/Procedures: No new testing needed   Follow-Up:  . You will need a follow up appointment in 12 months  . Providers on your designated Care Team:   . Nicolasa Ducking, NP . Eula Listen, PA-C . Marisue Ivan, PA-C    COVID-19 Vaccine Information can be found at: [email or web address] For questions related to vaccine distribution or appointments, please email [email or web address] or call [phone].

## 2020-10-04 ENCOUNTER — Other Ambulatory Visit: Payer: Self-pay

## 2020-10-17 NOTE — Pre-Procedure Instructions (Signed)
Surgical Instructions:    Your procedure is scheduled on Tuesday 10/23/20 (3:33 PM- 6:48 PM).  Report to [facility] Main Entrance "A" at 1:30 P.M., then check in with the Admitting office.  Call this number if you have any questions prior to, or have any problems the morning of surgery:  [phone].    Remember:  Do not eat after midnight the night before your surgery.  You may drink clear liquids until 12:30 PM the afternoon of your surgery.   Clear liquids allowed are: Water, Non-Citrus Juices (without pulp), Carbonated Beverages, Clear Tea, Black Coffee Only, and Gatorade.   Enhanced Recovery after Surgery for Orthopedics Enhanced Recovery after Surgery is a protocol used to improve the stress on your body and your recovery after surgery.  Patient Instructions  . The night before surgery:  o No food after midnight. ONLY clear liquids after midnight   . The day of surgery (if you do NOT have diabetes):  o Drink ONE (1) Pre-Surgery Clear Ensure by 12:30 PM the afternoon of surgery.   o This drink was given to you during your hospital  pre-op appointment visit. o Nothing else to drink after completing the Pre-Surgery Clear Ensure.          If you have questions, please contact your surgeon's office.     Take these medicines the morning of surgery with A SIP OF WATER: metoprolol tartrate (LOPRESSOR) omeprazole (PRILOSEC) rosuvastatin (CRESTOR) tamsulosin (FLOMAX)   IF NEEDED: benzonatate (TESSALON) nitroGLYCERIN (NITROSTAT)   As of today, STOP taking any Aspirin (unless otherwise instructed by your surgeon) Aleve, Naproxen, Ibuprofen, Motrin, Advil, Goody's, BC's, all herbal medications, fish oil, and all vitamins.                                            [facility]- Preparing for Total Shoulder Arthroplasty   Before surgery, you can play an important role. Because skin is not sterile, your skin needs to be as free of germs as possible. You can reduce the number  of germs on your skin by using the following products. . Benzoyl Peroxide Gel o Reduces the number of germs present on the skin o Applied twice a day to shoulder area starting two days before surgery   . Chlorhexidine Gluconate (CHG) Soap o An antiseptic cleaner that kills germs and bonds with the skin to continue killing germs even after washing o Used for showering the night before surgery and morning of surgery   Oral Hygiene is also important to reduce your risk of infection.                                    Remember - BRUSH YOUR TEETH THE MORNING OF SURGERY WITH YOUR REGULAR TOOTHPASTE  ==================================================================  Please follow these instructions carefully:  BENZOYL PEROXIDE 5% GEL  Please do not use if you have an allergy to benzoyl peroxide.   If your skin becomes reddened/irritated stop using the benzoyl peroxide.  Starting two days before surgery, apply as follows: 1. Apply benzoyl peroxide in the morning and at night. Apply after taking a shower. If you are not taking a shower clean entire shoulder front, back, and side along with the armpit with a clean wet washcloth.  2. Place a quarter-sized dollop on your shoulder  and rub in thoroughly, making sure to cover the front, back, and side of your shoulder, along with the armpit.   2 days before ____ AM   ____ PM              1 day before ____ AM   ____ PM                            3.  Do this twice a day for two days.  (Last application is the night before surgery, AFTER using the CHG soap as described below).  4. Do NOT apply benzoyl peroxide gel on the day of surgery.  CHLORHEXIDINE GLUCONATE (CHG) SOAP  Please do not use if you have an allergy to CHG or antibacterial soaps. If your skin becomes reddened/irritated stop using the CHG.   Do not shave (including legs and underarms) for at least 48 hours prior to first CHG shower. It is OK to shave your face.  Starting the night  before surgery, use CHG soap as follows:  1. Shower the NIGHT BEFORE SURGERY and MORNING OF SURGERY with CHG.  2. If you choose to wash your hair, wash your hair first as usual with your normal shampoo.  3. After shampooing, rinse your hair and body thoroughly to remove the shampoo.  4. Use CHG as you would any other liquid soap.  You can apply CHG directly to the skin and wash gently with a scrungie or a clean washcloth.  5. Apply the CHG soap to your body ONLY FROM THE NECK DOWN.  Do not use on open wounds or open sores.  Avoid contact with your eyes, ears, mouth, and genitals (private parts).  Wash face and genitals (private parts) with your normal soap.  6. Wash thoroughly, paying special attention to the area where your surgery will be performed.  7. Thoroughly rinse your body with warm water from the neck down.  8. DO NOT shower/wash with your normal soap after using and rinsing off the CHG soap.   9. Pat yourself dry with a CLEAN TOWEL.   10.  Apply benzoyl peroxide.   11. Wear CLEAN PAJAMAS to bed the night before surgery; wear comfortable clothes the morning of surgery.  12. Place CLEAN SHEETS on your bed the night of your first shower and DO NOT SLEEP WITH PETS.     Day of Surgery: SHOWER with CHG soap. Brush your teeth WITH YOUR REGULAR TOOTHPASTE. Wear Clean/Comfortable clothing the morning of surgery. Do not apply any deodorants/lotions.   Do not wear jewelry. Do not shave 48 hours prior to surgery.  Men may shave face and neck.  Do NOT Smoke (Tobacco/Vaping) or drink Alcohol 24 hours prior to your procedure. Do not bring valuables to the hospital. [facility] is not responsible for any belongings or valuables.  If you use a CPAP at night, you may bring all equipment for your overnight stay.   Contacts, glasses, or dentures may not be worn into surgery, please bring cases for these belongings.   For patients admitted to the hospital, discharge time will be  determined by your treatment team.   Patients discharged the day of surgery will not be allowed to drive home, and someone needs to stay with them for 24 hours.    Please read over the following fact sheets that you were given.

## 2020-10-18 ENCOUNTER — Encounter (HOSPITAL_COMMUNITY)
Admission: RE | Admit: 2020-10-18 | Discharge: 2020-10-18 | Disposition: A | Discharge status: Home / Self Care | Payer: No Typology Code available for payment source | Source: Ambulatory Visit | Attending: Orthopedic Surgery | Admitting: Orthopedic Surgery

## 2020-10-18 ENCOUNTER — Encounter (HOSPITAL_COMMUNITY): Payer: Self-pay

## 2020-10-18 ENCOUNTER — Other Ambulatory Visit: Payer: Self-pay

## 2020-10-18 DIAGNOSIS — Z87891 Personal history of nicotine dependence: Secondary | ICD-10-CM | POA: Diagnosis not present

## 2020-10-18 DIAGNOSIS — I10 Essential (primary) hypertension: Secondary | ICD-10-CM | POA: Insufficient documentation

## 2020-10-18 DIAGNOSIS — Z01812 Encounter for preprocedural laboratory examination: Secondary | ICD-10-CM | POA: Insufficient documentation

## 2020-10-18 DIAGNOSIS — Z6831 Body mass index (BMI) 31.0-31.9, adult: Secondary | ICD-10-CM | POA: Insufficient documentation

## 2020-10-18 DIAGNOSIS — E785 Hyperlipidemia, unspecified: Secondary | ICD-10-CM | POA: Insufficient documentation

## 2020-10-18 DIAGNOSIS — Z79899 Other long term (current) drug therapy: Secondary | ICD-10-CM | POA: Diagnosis not present

## 2020-10-18 DIAGNOSIS — K219 Gastro-esophageal reflux disease without esophagitis: Secondary | ICD-10-CM | POA: Diagnosis not present

## 2020-10-18 DIAGNOSIS — Z7901 Long term (current) use of anticoagulants: Secondary | ICD-10-CM | POA: Insufficient documentation

## 2020-10-18 DIAGNOSIS — I252 Old myocardial infarction: Secondary | ICD-10-CM | POA: Diagnosis not present

## 2020-10-18 DIAGNOSIS — E669 Obesity, unspecified: Secondary | ICD-10-CM | POA: Insufficient documentation

## 2020-10-18 DIAGNOSIS — Z7982 Long term (current) use of aspirin: Secondary | ICD-10-CM | POA: Diagnosis not present

## 2020-10-18 HISTORY — DX: Gastro-esophageal reflux disease without esophagitis: K21.9

## 2020-10-18 LAB — BASIC METABOLIC PANEL
Anion gap: 6 (ref 5–15)
BUN: 13 mg/dL (ref 6–20)
CO2: 25 mmol/L (ref 22–32)
Calcium: 9.3 mg/dL (ref 8.9–10.3)
Chloride: 106 mmol/L (ref 98–111)
Creatinine, Ser: 1.16 mg/dL (ref 0.61–1.24)
GFR, Estimated: >60 mL/min (ref >60)
Glucose, Bld: 99 mg/dL (ref 70–99)
Potassium: 3.9 mmol/L (ref 3.5–5.1)
Sodium: 137 mmol/L (ref 135–145)

## 2020-10-18 LAB — CBC
HCT: 40.8 % (ref 39.0–52.0)
Hemoglobin: 13.9 g/dL (ref 13.0–17.0)
MCH: 30.3 pg (ref 26.0–34.0)
MCHC: 34.1 g/dL (ref 30.0–36.0)
MCV: 89.1 fL (ref 80.0–100.0)
Platelets: 227 10*3/uL (ref 150–400)
RBC: 4.58 MIL/uL (ref 4.22–5.81)
RDW: 12.5 % (ref 11.5–15.5)
WBC: 8.5 10*3/uL (ref 4.0–10.5)
nRBC: 0 % (ref 0.0–0.2)

## 2020-10-18 LAB — SURGICAL PCR SCREEN
MRSA, PCR: NEGATIVE
Staphylococcus aureus: POSITIVE — AB

## 2020-10-18 NOTE — Progress Notes (Signed)
PCP - [facility] Cardiologist - Julien Nordmann, MD. See clearance note in Epic dated 09/21/2020.  PPM/ICD - Denies  Chest x-ray - N/A EKG - 09/21/2020 Stress Test - Denies ECHO - Denies Cardiac Cath - 01/2010  Sleep Study - Denies CPAP - N/A  Pt denies hx of Diabetes.  Blood Thinner Instructions: N/A Aspirin Instructions: Stop today unless otherwise specified by MD.   ERAS Protcol -Yes PRE-SURGERY Ensure or G2- Ensure  COVID TEST- Scheduled for 10/19/2020 @1345    Anesthesia review: Yes, hx of MI  Patient denies shortness of breath, fever, cough and chest pain at PAT appointment   All instructions explained to the patient, with a verbal understanding of the material. Patient agrees to go over the instructions while at home for a better understanding. Patient also instructed to self quarantine after being tested for COVID-19. The opportunity to ask questions was provided.

## 2020-10-19 ENCOUNTER — Other Ambulatory Visit (HOSPITAL_COMMUNITY)
Admission: RE | Admit: 2020-10-19 | Discharge: 2020-10-19 | Disposition: A | Discharge status: Home / Self Care | Payer: No Typology Code available for payment source | Source: Ambulatory Visit | Attending: Orthopedic Surgery | Admitting: Orthopedic Surgery

## 2020-10-19 DIAGNOSIS — Z20822 Contact with and (suspected) exposure to covid-19: Secondary | ICD-10-CM | POA: Insufficient documentation

## 2020-10-19 DIAGNOSIS — Z01812 Encounter for preprocedural laboratory examination: Secondary | ICD-10-CM | POA: Diagnosis present

## 2020-10-19 NOTE — Anesthesia Preprocedure Evaluation (Addendum)
Anesthesia Evaluation  Patient identified by MRN, date of birth, ID band Patient awake    Reviewed: Allergy & Precautions, NPO status , Patient's Chart, lab work & pertinent test results  Airway Mallampati: II  TM Distance: >3 FB Neck ROM: Full    Dental  (+) Poor Dentition, Missing Multiple missing and decayed teeth, patient states none loose:   Pulmonary former smoker,    Pulmonary exam normal breath sounds clear to auscultation       Cardiovascular Exercise Tolerance: Good hypertension, + Past MI and + Cardiac Stents  Normal cardiovascular exam Rhythm:Regular Rate:Normal  EKG: NSR   Neuro/Psych negative neurological ROS  negative psych ROS   GI/Hepatic Neg liver ROS, GERD  ,  Endo/Other  Hyperlipidemia Obesity BMI 31  Renal/GU negative Renal ROS  negative genitourinary   Musculoskeletal negative musculoskeletal ROS (+)   Abdominal   Peds negative pediatric ROS (+)  Hematology negative hematology ROS (+)   Anesthesia Other Findings   Reproductive/Obstetrics negative OB ROS                          Anesthesia Physical Anesthesia Plan  ASA: III  Anesthesia Plan: General and Regional   Post-op Pain Management: GA combined w/ Regional for post-op pain   Induction: Intravenous  PONV Risk Score and Plan: 2 and Treatment may vary due to age or medical condition, Ondansetron and Dexamethasone  Airway Management Planned: Oral ETT  Additional Equipment: None  Intra-op Plan:   Post-operative Plan: Extubation in OR  Informed Consent: I have reviewed the patients History and Physical, chart, labs and discussed the procedure including the risks, benefits and alternatives for the proposed anesthesia with the patient or authorized representative who has indicated his/her understanding and acceptance.       Plan Discussed with: Anesthesiologist  Anesthesia Plan Comments: (Interscalene  block with exparel. GETA. On ASA no Plavix. Last cardiology evaluation with Dr. Mariah Milling 09/21/2020: "Preop cardiovascular evaluation Acceptable risk for shoulder surgery, no further testing needed He will hold his Plavix, stay on low-dose aspirin". )     Anesthesia Quick Evaluation

## 2020-10-19 NOTE — Progress Notes (Signed)
Anesthesia Chart Review:  Case: [id number] Date/Time: 10/23/20 1518   Procedure: LEFT SHOULDER ARTHROSCOPY, DEBRIDEMENT, MINI OPEN ROTATOR CUFF TEAR REPAIR, BICEPS TENODESIS (Left )   Anesthesia type: General   Pre-op diagnosis: left shouder rotator cuff tear, biceps tendonitis   [facility] ROOM 06 / [facility] OR   Surgeons: Cammy Copa, MD      DISCUSSION: Patient is a 60 year old male scheduled for the above procedure.  History includes former smoker (quit 01/01/10), CAD (MI 10/2004; NSTEMI 01/21/10, s/p DES RCA 8//11), HLD, HTN, GERD. BMI is consistent with obesity.  Last cardiology evaluation with Dr. Mariah Milling 09/21/2020: "Preop cardiovascular evaluation Acceptable risk for shoulder surgery, no further testing needed He will hold his Plavix, stay on low-dose aspirin".  10/19/20 preoperative COVID-19 test is in process.  Anesthesia team to evaluate on the day of surgery.   VS: BP 113/70   Pulse 68   Temp 37.2 C (Oral)   Resp 20   Ht 5\' 10"  (1.778 m)   Wt 99 kg   SpO2 97%   BMI 31.31 kg/m   PROVIDERS: [facility]  [facility], MD is cardiologist   LABS: Labs reviewed: Acceptable for surgery. (all labs ordered are listed, but only abnormal results are displayed)  Labs Reviewed  SURGICAL PCR SCREEN - Abnormal; Notable for the following components:      Result Value   Staphylococcus aureus POSITIVE (*)    All other components within normal limits  CBC  BASIC METABOLIC PANEL     IMAGES: Xray Left Shoulder 08/29/20: AP outlet axillary left shoulder reviewed. Acromiohumeral distance  normal. Mild degenerative AC joint changes and no degenerative  glenohumeral joint changes. Shoulder is located. No fracture.   Visualized lung fields clear.   EKG: 09/21/20: NSR   CV: Per 01/30/10 office note by Dr. 02/01/10: Cardiac cath 01/22/10 [facility]):  Cardiac catheterization showed 99% distal RCA disease, moderate to severe proximal RCA disease estimated at  60-70%, mild diffuse LAD and left circumflex disease, moderate ostial D2 and D3 disease, moderate distal inferior wall hypokinesis on LV gram. A Xience 3.5 x 12 mm DES stent was placed to his distal RCA.    Past Medical History:  Diagnosis Date  . Coronary artery disease    a. 01/2010 NSTEMI/PCI: Mild diff LDA/LCX dzs, moderate ostial D2/3 dzs. RCA 60-70p, 99d (3.5x12 Xience DES).  . GERD (gastroesophageal reflux disease)   . Hyperlipidemia   . Hypertension   . MI (myocardial infarction) (HCC)    10/2004, 01/2010    Past Surgical History:  Procedure Laterality Date  . CARDIAC CATHETERIZATION    . FRACTURE SURGERY Right    wrist  60yo  . VASECTOMY  1984    MEDICATIONS: . aspirin 81 MG tablet  . benzonatate (TESSALON) 100 MG capsule  . furosemide (LASIX) 20 MG tablet  . lisinopril (ZESTRIL) 5 MG tablet  . metoprolol tartrate (LOPRESSOR) 25 MG tablet  . nitroGLYCERIN (NITROSTAT) 0.4 MG SL tablet  . omeprazole (PRILOSEC) 20 MG capsule  . potassium chloride SA (K-DUR,KLOR-CON) 20 MEQ tablet  . rosuvastatin (CRESTOR) 40 MG tablet  . tamsulosin (FLOMAX) 0.4 MG CAPS capsule   No current facility-administered medications for this encounter.    02/2010, PA-C Surgical Short Stay/Anesthesiology [facility] Phone [phone] [facility] Phone [phone] 10/19/2020 2:20 PM

## 2020-10-20 LAB — SARS CORONAVIRUS 2 (TAT 6-24 HRS): SARS Coronavirus 2: NEGATIVE

## 2020-10-23 ENCOUNTER — Ambulatory Visit (HOSPITAL_COMMUNITY): Payer: No Typology Code available for payment source | Admitting: Vascular Surgery

## 2020-10-23 ENCOUNTER — Ambulatory Visit (HOSPITAL_COMMUNITY)
Admission: RE | Admit: 2020-10-23 | Discharge: 2020-10-23 | Disposition: A | Discharge status: Home / Self Care | Payer: No Typology Code available for payment source | Attending: Orthopedic Surgery | Admitting: Orthopedic Surgery

## 2020-10-23 ENCOUNTER — Encounter: Payer: Self-pay | Admitting: Orthopedic Surgery

## 2020-10-23 ENCOUNTER — Encounter (HOSPITAL_COMMUNITY): Payer: Self-pay | Admitting: Orthopedic Surgery

## 2020-10-23 ENCOUNTER — Ambulatory Visit (HOSPITAL_COMMUNITY): Payer: No Typology Code available for payment source

## 2020-10-23 ENCOUNTER — Encounter (HOSPITAL_COMMUNITY)
Admission: RE | Disposition: A | Discharge status: Home / Self Care | Payer: Self-pay | Source: Home / Self Care | Attending: Orthopedic Surgery

## 2020-10-23 ENCOUNTER — Other Ambulatory Visit: Payer: Self-pay

## 2020-10-23 DIAGNOSIS — E785 Hyperlipidemia, unspecified: Secondary | ICD-10-CM | POA: Insufficient documentation

## 2020-10-23 DIAGNOSIS — I10 Essential (primary) hypertension: Secondary | ICD-10-CM | POA: Diagnosis not present

## 2020-10-23 DIAGNOSIS — Z888 Allergy status to other drugs, medicaments and biological substances status: Secondary | ICD-10-CM | POA: Diagnosis not present

## 2020-10-23 DIAGNOSIS — Z79899 Other long term (current) drug therapy: Secondary | ICD-10-CM | POA: Diagnosis not present

## 2020-10-23 DIAGNOSIS — I2510 Atherosclerotic heart disease of native coronary artery without angina pectoris: Secondary | ICD-10-CM | POA: Diagnosis not present

## 2020-10-23 DIAGNOSIS — M75122 Complete rotator cuff tear or rupture of left shoulder, not specified as traumatic: Secondary | ICD-10-CM | POA: Insufficient documentation

## 2020-10-23 DIAGNOSIS — S46012A Strain of muscle(s) and tendon(s) of the rotator cuff of left shoulder, initial encounter: Secondary | ICD-10-CM

## 2020-10-23 DIAGNOSIS — Z87891 Personal history of nicotine dependence: Secondary | ICD-10-CM | POA: Diagnosis not present

## 2020-10-23 DIAGNOSIS — Z8249 Family history of ischemic heart disease and other diseases of the circulatory system: Secondary | ICD-10-CM | POA: Diagnosis not present

## 2020-10-23 DIAGNOSIS — Z9852 Vasectomy status: Secondary | ICD-10-CM | POA: Insufficient documentation

## 2020-10-23 DIAGNOSIS — I252 Old myocardial infarction: Secondary | ICD-10-CM | POA: Diagnosis not present

## 2020-10-23 DIAGNOSIS — Z7982 Long term (current) use of aspirin: Secondary | ICD-10-CM | POA: Diagnosis not present

## 2020-10-23 DIAGNOSIS — S46012D Strain of muscle(s) and tendon(s) of the rotator cuff of left shoulder, subsequent encounter: Secondary | ICD-10-CM

## 2020-10-23 DIAGNOSIS — Z7902 Long term (current) use of antithrombotics/antiplatelets: Secondary | ICD-10-CM | POA: Insufficient documentation

## 2020-10-23 DIAGNOSIS — Z955 Presence of coronary angioplasty implant and graft: Secondary | ICD-10-CM | POA: Diagnosis not present

## 2020-10-23 DIAGNOSIS — M7522 Bicipital tendinitis, left shoulder: Secondary | ICD-10-CM

## 2020-10-23 HISTORY — PX: SHOULDER ARTHROSCOPY WITH OPEN ROTATOR CUFF REPAIR AND DISTAL CLAVICLE ACROMINECTOMY: SHX5683

## 2020-10-23 SURGERY — SHOULDER ARTHROSCOPY WITH OPEN ROTATOR CUFF REPAIR AND DISTAL CLAVICLE ACROMINECTOMY
Anesthesia: Regional | Laterality: Left

## 2020-10-23 MED ORDER — ALBUMIN HUMAN 5 % IV SOLN: INTRAVENOUS | Status: DC | PRN

## 2020-10-23 MED ORDER — CEFAZOLIN SODIUM-DEXTROSE 2-4 GM/100ML-% IV SOLN
INTRAVENOUS | Status: AC
  Filled 2020-10-23: qty 100

## 2020-10-23 MED ORDER — LIDOCAINE 2% (20 MG/ML) 5 ML SYRINGE
INTRAMUSCULAR | Status: DC | PRN
  Administered 2020-10-23: 80 mg via INTRAVENOUS

## 2020-10-23 MED ORDER — METHOCARBAMOL 500 MG PO TABS: 500.0000 mg | ORAL_TABLET | Freq: Three times a day (TID) | ORAL | 0 refills | Status: DC | PRN

## 2020-10-23 MED ORDER — EPHEDRINE SULFATE 50 MG/ML IJ SOLN
INTRAMUSCULAR | Status: DC | PRN
  Administered 2020-10-23 (×2): 10 mg via INTRAVENOUS
  Administered 2020-10-23: 15 mg via INTRAVENOUS
  Administered 2020-10-23: 10 mg via INTRAVENOUS
  Administered 2020-10-23: 5 mg via INTRAVENOUS

## 2020-10-23 MED ORDER — PROMETHAZINE HCL 25 MG/ML IJ SOLN: 6.2500 mg | INTRAMUSCULAR | Status: DC | PRN

## 2020-10-23 MED ORDER — CHLORHEXIDINE GLUCONATE 0.12 % MT SOLN
OROMUCOSAL | Status: AC
  Administered 2020-10-23: 15 mL via OROMUCOSAL
  Filled 2020-10-23: qty 15

## 2020-10-23 MED ORDER — EPINEPHRINE PF 1 MG/ML IJ SOLN
INTRAMUSCULAR | Status: DC | PRN
  Administered 2020-10-23: 1 mg

## 2020-10-23 MED ORDER — POVIDONE-IODINE 10 % EX SWAB
2.0000 "application " | Freq: Once | CUTANEOUS | Status: AC
  Administered 2020-10-23: 2 via TOPICAL

## 2020-10-23 MED ORDER — MIDAZOLAM HCL 2 MG/2ML IJ SOLN
INTRAMUSCULAR | Status: AC
  Filled 2020-10-23: qty 2

## 2020-10-23 MED ORDER — ACETAMINOPHEN 500 MG PO TABS
1000.0000 mg | ORAL_TABLET | Freq: Once | ORAL | Status: AC
Start: 2020-10-23 — End: 2020-10-23

## 2020-10-23 MED ORDER — KETOROLAC TROMETHAMINE 10 MG PO TABS
10.0000 mg | ORAL_TABLET | Freq: Three times a day (TID) | ORAL | 0 refills | Status: DC | PRN
Start: 2020-10-23 — End: 2020-12-05

## 2020-10-23 MED ORDER — AMISULPRIDE (ANTIEMETIC) 5 MG/2ML IV SOLN: 10.0000 mg | Freq: Once | INTRAVENOUS | Status: DC | PRN

## 2020-10-23 MED ORDER — OXYCODONE HCL 5 MG/5ML PO SOLN: 5.0000 mg | Freq: Once | ORAL | Status: DC | PRN

## 2020-10-23 MED ORDER — BUPIVACAINE HCL (PF) 0.5 % IJ SOLN
INTRAMUSCULAR | Status: DC | PRN
  Administered 2020-10-23: 20 mL via PERINEURAL

## 2020-10-23 MED ORDER — CHLORHEXIDINE GLUCONATE 0.12 % MT SOLN: 15.0000 mL | Freq: Once | OROMUCOSAL | Status: AC

## 2020-10-23 MED ORDER — OXYCODONE-ACETAMINOPHEN 5-325 MG PO TABS: 1.0000 | ORAL_TABLET | ORAL | 0 refills | Status: AC | PRN

## 2020-10-23 MED ORDER — FENTANYL CITRATE (PF) 100 MCG/2ML IJ SOLN
INTRAMUSCULAR | Status: AC
  Administered 2020-10-23: 50 ug via INTRAVENOUS
  Filled 2020-10-23: qty 2

## 2020-10-23 MED ORDER — ACETAMINOPHEN 500 MG PO TABS
ORAL_TABLET | ORAL | Status: AC
  Administered 2020-10-23: 1000 mg via ORAL
  Filled 2020-10-23: qty 2

## 2020-10-23 MED ORDER — MIDAZOLAM HCL 5 MG/5ML IJ SOLN
INTRAMUSCULAR | Status: DC | PRN
  Administered 2020-10-23: 1 mg via INTRAVENOUS

## 2020-10-23 MED ORDER — LACTATED RINGERS IV SOLN: INTRAVENOUS | Status: DC

## 2020-10-23 MED ORDER — PROPOFOL 10 MG/ML IV BOLUS
INTRAVENOUS | Status: DC | PRN
  Administered 2020-10-23: 150 mg via INTRAVENOUS

## 2020-10-23 MED ORDER — OXYCODONE HCL 5 MG PO TABS: 5.0000 mg | ORAL_TABLET | Freq: Once | ORAL | Status: DC | PRN

## 2020-10-23 MED ORDER — ROCURONIUM BROMIDE 10 MG/ML (PF) SYRINGE
PREFILLED_SYRINGE | INTRAVENOUS | Status: DC | PRN
  Administered 2020-10-23: 80 mg via INTRAVENOUS
  Administered 2020-10-23: 10 mg via INTRAVENOUS

## 2020-10-23 MED ORDER — CEFAZOLIN SODIUM-DEXTROSE 2-4 GM/100ML-% IV SOLN
2.0000 g | INTRAVENOUS | Status: AC
  Administered 2020-10-23: 2 g via INTRAVENOUS

## 2020-10-23 MED ORDER — FENTANYL CITRATE (PF) 100 MCG/2ML IJ SOLN: 50.0000 ug | Freq: Once | INTRAMUSCULAR | Status: AC

## 2020-10-23 MED ORDER — POVIDONE-IODINE 7.5 % EX SOLN: Freq: Once | CUTANEOUS | Status: DC

## 2020-10-23 MED ORDER — BUPIVACAINE LIPOSOME 1.3 % IJ SUSP
INTRAMUSCULAR | Status: DC | PRN
  Administered 2020-10-23: 10 mL via PERINEURAL

## 2020-10-23 MED ORDER — GLYCOPYRROLATE 0.2 MG/ML IJ SOLN
INTRAMUSCULAR | Status: DC | PRN
  Administered 2020-10-23 (×2): .1 mg via INTRAVENOUS

## 2020-10-23 MED ORDER — ONDANSETRON HCL 4 MG/2ML IJ SOLN
INTRAMUSCULAR | Status: DC | PRN
  Administered 2020-10-23: 4 mg via INTRAVENOUS

## 2020-10-23 MED ORDER — FENTANYL CITRATE (PF) 250 MCG/5ML IJ SOLN
INTRAMUSCULAR | Status: AC
  Filled 2020-10-23: qty 5

## 2020-10-23 MED ORDER — PHENYLEPHRINE HCL-NACL 10-0.9 MG/250ML-% IV SOLN
INTRAVENOUS | Status: DC | PRN
  Administered 2020-10-23 (×2): 30 ug/min via INTRAVENOUS
  Administered 2020-10-23: 100 ug/min via INTRAVENOUS

## 2020-10-23 MED ORDER — MIDAZOLAM HCL 2 MG/2ML IJ SOLN: 1.0000 mg | Freq: Once | INTRAMUSCULAR | Status: AC

## 2020-10-23 MED ORDER — SODIUM CHLORIDE 0.9 % IR SOLN
Status: DC | PRN
  Administered 2020-10-23 (×2): 3000 mL

## 2020-10-23 MED ORDER — BUPIVACAINE HCL (PF) 0.25 % IJ SOLN
INTRAMUSCULAR | Status: AC
  Filled 2020-10-23: qty 30

## 2020-10-23 MED ORDER — FENTANYL CITRATE (PF) 100 MCG/2ML IJ SOLN
INTRAMUSCULAR | Status: DC | PRN
  Administered 2020-10-23: 100 ug via INTRAVENOUS

## 2020-10-23 MED ORDER — EPINEPHRINE PF 1 MG/ML IJ SOLN
INTRAMUSCULAR | Status: AC
  Filled 2020-10-23: qty 2

## 2020-10-23 MED ORDER — MIDAZOLAM HCL 2 MG/2ML IJ SOLN
INTRAMUSCULAR | Status: AC
  Administered 2020-10-23: 1 mg via INTRAVENOUS
  Filled 2020-10-23: qty 2

## 2020-10-23 MED ORDER — ORAL CARE MOUTH RINSE: 15.0000 mL | Freq: Once | OROMUCOSAL | Status: AC

## 2020-10-23 MED ORDER — FENTANYL CITRATE (PF) 100 MCG/2ML IJ SOLN: 25.0000 ug | INTRAMUSCULAR | Status: DC | PRN

## 2020-10-23 SURGICAL SUPPLY — 64 items
ANCHOR FBRTK 2.6 SUTURETAP 1.3 (Anchor) ×4 IMPLANT
ANCHOR SL BIO 4.75 W/FIBERTAPE (Anchor) ×4 IMPLANT
ANCHOR SUT 1.8 FBRTK KNTLS 2SU (Anchor) ×4 IMPLANT
BLADE EXCALIBUR 4.0X13 (MISCELLANEOUS) IMPLANT
BLADE SURG 11 STRL SS (BLADE) ×2 IMPLANT
CLOSURE STERI-STRIP 1/4X4 (GAUZE/BANDAGES/DRESSINGS) ×2 IMPLANT
COVER SURGICAL LIGHT HANDLE (MISCELLANEOUS) ×2 IMPLANT
COVER WAND RF STERILE (DRAPES) ×2 IMPLANT
DRAPE INCISE IOBAN 66X45 STRL (DRAPES) ×4 IMPLANT
DRAPE STERI 35X30 U-POUCH (DRAPES) ×2 IMPLANT
DRAPE U-SHAPE 47X51 STRL (DRAPES) ×4 IMPLANT
DRSG AQUACEL AG ADV 3.5X10 (GAUZE/BANDAGES/DRESSINGS) ×2 IMPLANT
DRSG PAD ABDOMINAL 8X10 ST (GAUZE/BANDAGES/DRESSINGS) ×2 IMPLANT
DRSG TEGADERM 4X4.75 (GAUZE/BANDAGES/DRESSINGS) ×2 IMPLANT
DRSG XEROFORM 1X8 (GAUZE/BANDAGES/DRESSINGS) ×2 IMPLANT
DURAPREP 26ML APPLICATOR (WOUND CARE) ×2 IMPLANT
ELECT REM PT RETURN 9FT ADLT (ELECTROSURGICAL) ×2
ELECTRODE REM PT RTRN 9FT ADLT (ELECTROSURGICAL) ×1 IMPLANT
FILTER STRAW FLUID ASPIR (MISCELLANEOUS) ×2 IMPLANT
GAUZE SPONGE 4X4 12PLY STRL (GAUZE/BANDAGES/DRESSINGS) ×2 IMPLANT
GAUZE SPONGE 4X4 12PLY STRL LF (GAUZE/BANDAGES/DRESSINGS) ×2 IMPLANT
GAUZE XEROFORM 1X8 LF (GAUZE/BANDAGES/DRESSINGS) ×2 IMPLANT
GLOVE ECLIPSE 8.0 STRL XLNG CF (GLOVE) ×2 IMPLANT
GLOVE SRG 8 PF TXTR STRL LF DI (GLOVE) ×1 IMPLANT
GLOVE SURG UNDER POLY LF SZ8 (GLOVE) ×1
GOWN STRL REUS W/ TWL LRG LVL3 (GOWN DISPOSABLE) ×3 IMPLANT
GOWN STRL REUS W/TWL LRG LVL3 (GOWN DISPOSABLE) ×3
KIT BASIN OR (CUSTOM PROCEDURE TRAY) ×2 IMPLANT
KIT STR SPEAR 1.8 FBRTK DISP (KITS) ×2 IMPLANT
KIT TURNOVER KIT B (KITS) ×2 IMPLANT
MANIFOLD NEPTUNE II (INSTRUMENTS) ×2 IMPLANT
NDL SUT 6 .5 CRC .975X.05 MAYO (NEEDLE) ×1 IMPLANT
NEEDLE HYPO 25X1 1.5 SAFETY (NEEDLE) ×2 IMPLANT
NEEDLE MAYO TAPER (NEEDLE) ×1
NEEDLE MAYO TROCAR (NEEDLE) ×2 IMPLANT
NEEDLE SCORPION MULTI FIRE (NEEDLE) ×4 IMPLANT
NEEDLE SPNL 18GX3.5 QUINCKE PK (NEEDLE) ×2 IMPLANT
NS IRRIG 1000ML POUR BTL (IV SOLUTION) ×2 IMPLANT
PACK SHOULDER (CUSTOM PROCEDURE TRAY) ×2 IMPLANT
PAD ARMBOARD 7.5X6 YLW CONV (MISCELLANEOUS) ×4 IMPLANT
PORT APPOLLO RF 90DEGREE MULTI (SURGICAL WAND) ×2 IMPLANT
RESTRAINT HEAD UNIVERSAL NS (MISCELLANEOUS) ×2 IMPLANT
SLING ARM IMMOBILIZER LRG (SOFTGOODS) ×2 IMPLANT
SPONGE LAP 4X18 RFD (DISPOSABLE) ×4 IMPLANT
SPONGE T-LAP 4X18 ~~LOC~~+RFID (SPONGE) ×10 IMPLANT
STRIP CLOSURE SKIN 1/2X4 (GAUZE/BANDAGES/DRESSINGS) ×2 IMPLANT
SUCTION FRAZIER HANDLE 10FR (MISCELLANEOUS) ×1
SUCTION TUBE FRAZIER 10FR DISP (MISCELLANEOUS) ×1 IMPLANT
SUT ETHILON 3 0 PS 1 (SUTURE) ×2 IMPLANT
SUT MNCRL AB 3-0 PS2 18 (SUTURE) ×2 IMPLANT
SUT VIC AB 0 CT1 27 (SUTURE) ×1
SUT VIC AB 0 CT1 27XBRD ANBCTR (SUTURE) ×1 IMPLANT
SUT VIC AB 1 CT1 27 (SUTURE) ×2
SUT VIC AB 1 CT1 27XBRD ANBCTR (SUTURE) ×2 IMPLANT
SUT VIC AB 2-0 CT1 27 (SUTURE) ×1
SUT VIC AB 2-0 CT1 TAPERPNT 27 (SUTURE) ×1 IMPLANT
SUT VICRYL 0 UR6 27IN ABS (SUTURE) ×18 IMPLANT
SYR 20ML LL LF (SYRINGE) ×4 IMPLANT
SYR 3ML LL SCALE MARK (SYRINGE) ×2 IMPLANT
SYR TB 1ML LUER SLIP (SYRINGE) ×2 IMPLANT
TOWEL GREEN STERILE (TOWEL DISPOSABLE) ×2 IMPLANT
TOWEL GREEN STERILE FF (TOWEL DISPOSABLE) ×2 IMPLANT
TUBING ARTHROSCOPY IRRIG 16FT (MISCELLANEOUS) ×2 IMPLANT
WATER STERILE IRR 1000ML POUR (IV SOLUTION) ×2 IMPLANT

## 2020-10-23 NOTE — Transfer of Care (Signed)
Immediate Anesthesia Transfer of Care Note  Patient: Erik Perkins  Procedure(s) Performed: LEFT SHOULDER ARTHROSCOPY, DEBRIDEMENT, MINI OPEN ROTATOR CUFF TEAR REPAIR, BICEPS TENODESIS (Left )  Patient Location: PACU  Anesthesia Type:General  Level of Consciousness: awake, alert  and oriented  Airway & Oxygen Therapy: Patient Spontanous Breathing and Patient connected to face mask oxygen  Post-op Assessment: Report given to RN and Post -op Vital signs reviewed and stable  Post vital signs: Reviewed and stable  Last Vitals:  Vitals Value Taken Time  BP 101/57 10/23/20 1420  Temp    Pulse 83 10/23/20 1423  Resp 15 10/23/20 1423  SpO2 98 % 10/23/20 1423  Vitals shown include unvalidated device data.  Last Pain:  Vitals:   10/23/20 0956  TempSrc: Oral  PainSc:       Patients Stated Pain Goal: 2 (10/23/20 0944)  Complications: No complications documented.

## 2020-10-23 NOTE — Brief Op Note (Signed)
   10/23/2020  2:51 PM  PATIENT:  Erik Perkins  60 y.o. male  PRE-OPERATIVE DIAGNOSIS:  left shouder rotator cuff tear, biceps tendonitis  POST-OPERATIVE DIAGNOSIS:  left shouder rotator cuff tear, biceps tendonitis  PROCEDURE:  Procedure(s): LEFT SHOULDER ARTHROSCOPY, DEBRIDEMENT, MINI OPEN ROTATOR CUFF TEAR REPAIR, BICEPS TENODESIS  SURGEON:  Surgeon(s): Cammy Copa, MD  ASSISTANT: magnant pa  ANESTHESIA:   general  EBL: 15 ml    Total I/O In: 1450 [I.V.:1200; IV Piggyback:250] Out: 100 [Blood:100]  BLOOD ADMINISTERED: none  DRAINS: none   LOCAL MEDICATIONS USED:  none  SPECIMEN:  No Specimen  COUNTS:  YES  TOURNIQUET:  * No tourniquets in log *  DICTATION: .Other Dictation: Dictation Number [id number]  PLAN OF CARE: Discharge to home after PACU  PATIENT DISPOSITION:  PACU - hemodynamically stable

## 2020-10-23 NOTE — Anesthesia Procedure Notes (Signed)
Procedure Name: Intubation Date/Time: 10/23/2020 11:15 AM Performed by: Merlinda Frederick, MD Pre-anesthesia Checklist: Patient identified, Emergency Drugs available, Suction available and Patient being monitored Patient Re-evaluated:Patient Re-evaluated prior to induction Oxygen Delivery Method: Circle system utilized Preoxygenation: Pre-oxygenation with 100% oxygen Induction Type: IV induction Ventilation: Mask ventilation without difficulty Laryngoscope Size: 4 and Mac Grade View: Grade I Tube type: Oral Tube size: 7.5 mm Number of attempts: 1 Airway Equipment and Method: Stylet and Oral airway Placement Confirmation: ETT inserted through vocal cords under direct vision,  positive ETCO2 and breath sounds checked- equal and bilateral Secured at: 22 cm Tube secured with: Tape Dental Injury: Teeth and Oropharynx as per pre-operative assessment  Comments: Inserted by Paulina Fusi, SRNA

## 2020-10-23 NOTE — H&P (Signed)
Erik Perkins is an 60 y.o. male.   Chief Complaint: Left shoulder pain HPI: Erik Perkins is a 60 year old patient with left shoulder pain.  Date of injury 1/2 years ago when he was pulling on something at work.  Felt a pop in his shoulder.  Developed pain and weakness.  MRI scan done in October 2021 showed a full-thickness tear of the supraspinatus..  Scan and report are reviewed..  He has continued to work but he does report pain and weakness with movement.  He works as an Retail banker.  He is right-hand dominant.  Pain is in the 5 out of 10 range.  Patient has a history of MI with stent placement in 2011.  Has appointment with [facility] cardiologist in [facility] Dr. Mariah Milling April 15.  Patient does take Plavix and aspirin among other medications.  He is here with his wife today who is extremely supportive.  And attentive.  Past Medical History:  Diagnosis Date  . Coronary artery disease    a. 01/2010 NSTEMI/PCI: Mild diff LDA/LCX dzs, moderate ostial D2/3 dzs. RCA 60-70p, 99d (3.5x12 Xience DES).  . GERD (gastroesophageal reflux disease)   . Hyperlipidemia   . Hypertension   . MI (myocardial infarction) (HCC)    10/2004, 01/2010    Past Surgical History:  Procedure Laterality Date  . CARDIAC CATHETERIZATION    . FRACTURE SURGERY Right    wrist  60yo  . VASECTOMY  1984    Family History  Problem Relation Age of Onset  . Coronary artery disease Sister        stents placed  . Diabetes Sister   . Hyperlipidemia Sister   . Cancer Father        lung  . Hyperlipidemia Father    Social History:  reports that he quit smoking about 10 years ago. He has a 25.00 pack-year smoking history. He has never used smokeless tobacco. He reports current alcohol use of about 4.0 standard drinks of alcohol per week. He reports that he does not use drugs.  Allergies:  Allergies  Allergen Reactions  . Aspartame     Other reaction(s): Anaphylaxis  . Other Rash    Cephadrine    Medications Prior to  Admission  Medication Sig Dispense Refill  . aspirin 81 MG tablet Take 81 mg by mouth daily.    . benzonatate (TESSALON) 100 MG capsule Take 100 mg by mouth 2 (two) times daily as needed for cough.    Marland Kitchen lisinopril (ZESTRIL) 5 MG tablet Take 1 tablet (5 mg total) by mouth daily. 90 tablet 2  . metoprolol tartrate (LOPRESSOR) 25 MG tablet Take 0.5 tablets (12.5 mg total) by mouth 2 (two) times daily. 180 tablet 3  . omeprazole (PRILOSEC) 20 MG capsule Take 40 mg by mouth daily.    . rosuvastatin (CRESTOR) 40 MG tablet TAKE 1 TABLET DAILY (Patient taking differently: Take 40 mg by mouth daily.) 90 tablet 3  . tamsulosin (FLOMAX) 0.4 MG CAPS capsule Take 1 capsule (0.4 mg total) by mouth 2 (two) times daily.    . furosemide (LASIX) 20 MG tablet Take 1 tablet (20 mg total) by mouth daily as needed. (Patient not taking: Reported on 10/12/2020) 90 tablet 3  . nitroGLYCERIN (NITROSTAT) 0.4 MG SL tablet Place 1 tablet (0.4 mg total) under the tongue every 5 (five) minutes as needed for chest pain. 25 tablet 0  . potassium chloride SA (K-DUR,KLOR-CON) 20 MEQ tablet Take 1 tablet (20 mEq total) by mouth daily  as needed. (Patient not taking: Reported on 10/12/2020) 90 tablet 3    No results found for this or any previous visit (from the past 48 hour(s)). No results found.  Review of Systems  Musculoskeletal: Positive for arthralgias.  All other systems reviewed and are negative.   Blood pressure (!) 94/53, pulse (!) 50, temperature 97.8 F (36.6 C), temperature source Oral, resp. rate 13, height 5\' 10"  (1.778 m), SpO2 96 %. Physical Exam Vitals reviewed.  HENT:     Head: Normocephalic.     Nose: Nose normal.     Mouth/Throat:     Mouth: Mucous membranes are moist.  Eyes:     Pupils: Pupils are equal, round, and reactive to light.  Cardiovascular:     Rate and Rhythm: Normal rate.     Pulses: Normal pulses.  Pulmonary:     Effort: Pulmonary effort is normal.  Abdominal:     General: Abdomen is  flat.  Musculoskeletal:     Cervical back: Normal range of motion.  Skin:    General: Skin is warm.     Capillary Refill: Capillary refill takes less than 2 seconds.  Neurological:     General: No focal deficit present.     Mental Status: He is alert.  Psychiatric:        Mood and Affect: Mood normal.     Ortho exam demonstrates good cervical spine range of motion.  Left shoulder demonstrates 5 out of 5 grip EPL FPL interosseous are/extension bicep triceps and deltoid strength with palpable radial pulse.  Shoulder range of motion on the left passively is 60/80/110.  Patient has pretty reasonable external rotation strength at 15 degrees of abduction as well as subscap strength.  Supraspinatus strength also pretty reasonable but very painful.  Does have a lot of coarse grinding and crepitus with active and passive range of motion of that left shoulder at 90 degrees of abduction.  No masses lymphadenopathy or skin changes noted in that shoulder girdle region. Assessment/Plan Impression is left shoulder full-thickness rotator cuff tear.  Hard to say if this is a repairable tear.  He is getting a little bit of shoulder stiffness as well I would like for him to work on passive range of motion particularly in the supine position.  Working on abduction and forward flexion is demonstrated.  He plans to go and get the scan from the or release the report so I can review it and then make operative plans from there.  Would like to have cardiac risk stratification with Dr. [place] prior to surgery but currently he is having no real issues with any type of exertional chest pain or any problems from a cardiac standpoint.  Anticipate that he will need to be off aspirin and Plavix for 5 to 7 days prior to elective surgery.    Scan has been reviewed and it does show supraspinatus tear with primarily posterior retraction.  Plan at this time is shoulder arthroscopy with biceps tendon release and subsequent mini open  rotator cuff tear repair and biceps tenodesis.  Risk benefits are discussed including but not limited to infection nerve vessel damage shoulder stiffness as well as potential for retearing.  Anticipate 2 to 3 months out of work as a Mariah Milling.  All questions answered.  Patient has been set up with shoulder CPM machine at home.  Curator, MD 10/23/2020, 10:53 AM

## 2020-10-23 NOTE — Anesthesia Procedure Notes (Signed)
Anesthesia Regional Block: Interscalene brachial plexus block   Pre-Anesthetic Checklist: ,, timeout performed, Correct Patient, Correct Site, Correct Laterality, Correct Procedure, Correct Position, site marked, Risks and benefits discussed,  Surgical consent,  Pre-op evaluation,  At surgeon's request and post-op pain management  Laterality: Left  Prep: chloraprep       Needles:  Injection technique: Single-shot  Needle Type: Echogenic Stimulator Needle     Needle Length: 9cm  Needle Gauge: 20     Additional Needles:   Procedures:,,,, ultrasound used (permanent image in chart),,,,  Narrative:  Start time: 10/23/2020 10:20 AM End time: 10/23/2020 10:25 AM Injection made incrementally with aspirations every 5 mL.  Performed by: Personally  Anesthesiologist: Mellody Dance, MD  Additional Notes: Functioning IV was confirmed and monitors applied.   Sterile prep and drape,hand hygiene and sterile gloves were used.Ultrasound guidance: relevant anatomy identified, needle position confirmed, local anesthetic spread visualized around nerve(s)., vascular puncture avoided.  Image printed for medical record.  Negative aspiration and negative test dose prior to incremental administration of local anesthetic. The patient tolerated the procedure well.

## 2020-10-24 NOTE — Op Note (Signed)
NAMEFRANCES, JOYNT MEDICAL RECORD NO: [id number] ACCOUNT NO: [id number] DATE OF BIRTH: [birth year] FACILITY: [facility] LOCATION: [facility] PHYSICIAN: Graylin Shiver. August Saucer, MD  Operative Report   PREOPERATIVE DIAGNOSES:  Left shoulder rotator cuff tear and biceps tendinitis.  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  PROCEDURES:  Left shoulder examination under anesthesia with arthroscopy, superior labral debridement with biceps tendon release, mini open rotator cuff tear repair of a 2 x 2 cm rotator cuff tear and open biceps tenodesis.  SURGEON:  Graylin Shiver. August Saucer, MD  ASSISTANT:  Karenann Cai, PA  INDICATIONS:  This is a 60 year old patient with left shoulder pain refractory to nonoperative management.  He had an injury last year where he felt a pop.  He has had significant pain and coarse grinding in the shoulder since that time.  OPERATIVE FINDINGS:  Examination under anesthesia, the patient had good stability anterior and posterior with less than a centimeter sulcus sign.  The patient did have slight restriction of forward flexion to 150, which was manipulated up to 180.  Also  had slight restriction of isolated glenohumeral abduction to about 85, which was manipulated to 105.  External rotation at 15 degrees of abduction was about 45, manipulated to 60.  DESCRIPTION OF PROCEDURE:  Following that, the patient was placed in the beach chair position with the head in neutral position.  Left arm, shoulder and hand was prescrubbed with alcohol and Betadine and allowed to air dry, prepped with DuraPrep solution  and draped in a sterile manner.  Ioban used to seal the operative field and cover the axilla.  Posterior portal was created.  Anterior portal created under direct visualization.  Diagnostic arthroscopy was performed.  There was some early inflammation  within the capsule, but no red, hot inflammation in the rotator  interval.  The glenohumeral articular surfaces were intact.  Anterior inferior, posterior inferior glenohumeral ligament intact.  The rotator cuff tear was supraspinatus about 2 x 2 cm.   Next, the anterior portal was created.  The patient had degenerative labral fraying and tearing along with biceps tendinitis.  Biceps tendon was released.  Superior labrum was debrided.  The joint was irrigated.  Instruments were removed.  Portals were  closed using 3-0 nylon.  Collier Flowers was then used to cover the entire operative field.  Incision made off the anterolateral margin of the acromion.  Skin and subcutaneous tissue were sharply divided.  The deltoid was split.  A measured distance of 4 cm from  the anterolateral margin of the acromion marked with a #1 Vicryl suture.  Bursectomy performed.  A subacromial decompression performed with leveling of the acromion about 2-3 mm.  Biceps tendon was then tenodesed into the bicipital groove using 2 Arthrex  1.9 SutureTaks.  Secured fixation was achieved under appropriate tension and oversewn with 3-0 Vicryl sutures.  Next, the rotator cuff tendon tear itself was identified.  This was a chronic-appearing tear, which had thickened.  Tear edges were debrided.   The tear was more comparable to a divot taken out of the humeral head, 2 x 2 cm as opposed to a retracted tear.  The tear edges were debrided.  The footprint was then debrided also to bleeding bone.  Six 0 Vicryl sutures were placed in the leading edge  of the tear to help with mobilize.  These were grasping Mason-Allen sutures. At this time, 2 anchors were placed at the articular surface greater tuberosity margin.  Eight of these Arthrex SutureTapes and the anchors were placed equidistant in the  rotator cuff tendon.  They were tied and crossed.  The posterior SutureTapes were matched with the posterior Vicryls and the anterior SutureTapes matched with the anterior Vicryls and these were placed into a SwiveLock x2 with the  arm in abduction.  Good  bony fixation was achieved.  Watertight repair was achieved.  Thorough irrigation was performed.  The self-retaining retractor was removed.  Deltoid split was closed using #1 Vicryl suture followed by interrupted inverted 0 Vicryl suture, 2-0 Vicryl  suture, and 3-0 Monocryl.  Steri-Strips and impervious dressings and a shoulder immobilizer were placed.  Luke's assistance was required at all times for retraction, mobilization of tissue.  His assistance was a medical necessity.   NIK D: 10/23/2020 2:58:15 pm T: 10/24/2020 5:05:00 am  JOB: [id number]/ [id number]

## 2020-10-24 NOTE — Anesthesia Postprocedure Evaluation (Signed)
Anesthesia Post Note  Patient: Erik Perkins  Procedure(s) Performed: LEFT SHOULDER ARTHROSCOPY, DEBRIDEMENT, MINI OPEN ROTATOR CUFF TEAR REPAIR, BICEPS TENODESIS (Left )     Patient location during evaluation: PACU Anesthesia Type: Regional Level of consciousness: awake and alert Pain management: pain level controlled Vital Signs Assessment: post-procedure vital signs reviewed and stable Respiratory status: spontaneous breathing, nonlabored ventilation and respiratory function stable Cardiovascular status: blood pressure returned to baseline and stable Postop Assessment: no apparent nausea or vomiting Anesthetic complications: no   No complications documented.  Last Vitals:  Vitals:   10/23/20 1445 10/23/20 1501  BP: 118/68   Pulse: 66   Resp: 12   Temp:  (!) 36.1 C  SpO2: 99%     Last Pain:  Vitals:   10/23/20 1442  TempSrc:   PainSc: 0-No pain   Pain Goal: Patients Stated Pain Goal: 2 (10/23/20 0944)                 Mellody Dance

## 2020-10-25 ENCOUNTER — Encounter (HOSPITAL_COMMUNITY): Payer: Self-pay | Admitting: Orthopedic Surgery

## 2020-10-28 DIAGNOSIS — M7522 Bicipital tendinitis, left shoulder: Secondary | ICD-10-CM

## 2020-10-28 DIAGNOSIS — S46012A Strain of muscle(s) and tendon(s) of the rotator cuff of left shoulder, initial encounter: Secondary | ICD-10-CM

## 2020-10-31 ENCOUNTER — Ambulatory Visit (INDEPENDENT_AMBULATORY_CARE_PROVIDER_SITE_OTHER): Payer: No Typology Code available for payment source | Admitting: Orthopedic Surgery

## 2020-10-31 DIAGNOSIS — Z9889 Other specified postprocedural states: Secondary | ICD-10-CM

## 2020-11-01 ENCOUNTER — Telehealth: Payer: Self-pay | Admitting: Orthopedic Surgery

## 2020-11-01 ENCOUNTER — Encounter: Payer: Self-pay | Admitting: Orthopedic Surgery

## 2020-11-01 NOTE — Progress Notes (Signed)
Post-Op Visit Note   Patient: Erik Perkins           Date of Birth: January 04, 1961           MRN: [id number] Visit Date: 10/31/2020 [facility]: [facility]   Assessment & Plan:  Chief Complaint:  Chief Complaint  Patient presents with  . Left Shoulder - Routine Post Op    10/23/2020 left shoulder scope, debridement, MORCTR, BT   Visit Diagnoses: No diagnosis found.  Plan: Patient is a 60 year old male who presents s/p left shoulder arthroscopy with rotator cuff repair and biceps tenodesis on 10/23/2020.  He reports that he is doing well and pain is fairly well controlled.  Only has to take oxycodone a couple times per day and really only at night.  He is using the CPM machine 3 times per day and is up to 85 degrees on the machine.  He wear his sling most of the time and is not lifting anything with the operative arm.  Pain does occasionally wake him up at night.  On exam he has 15 degrees external rotation, 60 degrees abduction, 75 degrees forward flexion.  Incisions are healing well and sutures removed and replaced with Steri-Strips.  Axillary nerve intact with deltoid firing.  Plan to have patient continue using the sling and continue with CPM machine.  Do not lift anything with the arm or lift the arm under its own power.  Answered patient's questions to his satisfaction.  Follow-up in 2 weeks for clinical recheck with Dr. August Saucer.  Plan to initiate physical therapy at that time.  No rotator cuff strengthening until 12/04/2020.  Follow-Up Instructions: No follow-ups on file.   Orders:  No orders of the defined types were placed in this encounter.  No orders of the defined types were placed in this encounter.   Imaging: No results found.  PMFS History: Patient Active Problem List   Diagnosis Date Noted  . Biceps tendonitis, left   . Traumatic complete tear of left rotator cuff   . Atherosclerosis of native coronary artery of native heart with stable angina pectoris (HCC)  01/05/2017  . Essential hypertension 01/05/2017  . Stented coronary artery 08/08/2011  . Smoking hx 08/08/2011  . Hyperlipidemia 01/30/2010  . Coronary atherosclerosis 01/30/2010   Past Medical History:  Diagnosis Date  . Coronary artery disease    a. 01/2010 NSTEMI/PCI: Mild diff LDA/LCX dzs, moderate ostial D2/3 dzs. RCA 60-70p, 99d (3.5x12 Xience DES).  . GERD (gastroesophageal reflux disease)   . Hyperlipidemia   . Hypertension   . MI (myocardial infarction) (HCC)    10/2004, 01/2010    Family History  Problem Relation Age of Onset  . Coronary artery disease Sister        stents placed  . Diabetes Sister   . Hyperlipidemia Sister   . Cancer Father        lung  . Hyperlipidemia Father     Past Surgical History:  Procedure Laterality Date  . CARDIAC CATHETERIZATION    . FRACTURE SURGERY Right    wrist  60yo  . SHOULDER ARTHROSCOPY WITH OPEN ROTATOR CUFF REPAIR AND DISTAL CLAVICLE ACROMINECTOMY Left 10/23/2020   Procedure: LEFT SHOULDER ARTHROSCOPY, DEBRIDEMENT, MINI OPEN ROTATOR CUFF TEAR REPAIR, BICEPS TENODESIS;  Surgeon: Cammy Copa, MD;  Location: [facility] OR;  Service: Orthopedics;  Laterality: Left;  Marland Kitchen VASECTOMY  1984   Social History   Occupational History  . Not on file  Tobacco Use  . Smoking  status: Former Smoker    Packs/day: 1.00    Years: 25.00    Pack years: 25.00    Quit date: 01/01/2010    Years since quitting: 10.8  . Smokeless tobacco: Never Used  Vaping Use  . Vaping Use: Never used  Substance and Sexual Activity  . Alcohol use: Yes    Alcohol/week: 4.0 standard drinks    Types: 2 Cans of beer, 2 Standard drinks or equivalent per week    Comment: about every two months  . Drug use: No  . Sexual activity: Yes    Birth control/protection: Surgical

## 2020-11-01 NOTE — Telephone Encounter (Signed)
Received medical records release form and $25.00 cash from patient. Forwarding to [facility] today  °

## 2020-11-14 ENCOUNTER — Ambulatory Visit (INDEPENDENT_AMBULATORY_CARE_PROVIDER_SITE_OTHER): Payer: No Typology Code available for payment source | Admitting: Orthopedic Surgery

## 2020-11-14 ENCOUNTER — Other Ambulatory Visit: Payer: Self-pay

## 2020-11-14 ENCOUNTER — Encounter: Payer: No Typology Code available for payment source | Admitting: Orthopedic Surgery

## 2020-11-14 DIAGNOSIS — Z9889 Other specified postprocedural states: Secondary | ICD-10-CM

## 2020-11-16 ENCOUNTER — Encounter: Payer: Self-pay | Admitting: Orthopedic Surgery

## 2020-11-16 NOTE — Progress Notes (Signed)
Post-Op Visit Note   Patient: Erik Perkins           Date of Birth: 1961-01-30           MRN: [id number] Visit Date: 11/14/2020 [facility]CP: [facility]   Assessment & Plan:  Chief Complaint:  Chief Complaint  Patient presents with   Left Shoulder - Routine Post Op   Visit Diagnoses:  1. Status post rotator cuff repair    [place] is now 3 weeks out left shoulder arthroscopy with rotator cuff repair.  Doing CPM 90 minutes 3 times a day.  Gets up to 120 on CPM but it is painful.  On examination he is got good passive range of motion of the shoulder with no coarse grinding or crepitus with motion.  Rotator cuff strength actually feels pretty reasonable.  Plan is therapy here for passive range of motion and isometrics for 3 weeks then okay for strengthening at 6 weeks postop.  We will do that 2-3 times a week for 6 weeks.  Follow-up in 6 weeks for clinical recheck. Plan: See above  Follow-Up Instructions: No follow-ups on file.   Orders:  Orders Placed This Encounter  Procedures   Ambulatory referral to Physical Therapy   No orders of the defined types were placed in this encounter.   Imaging: No results found.  PMFS History: Patient Active Problem List   Diagnosis Date Noted   Biceps tendonitis, left    Traumatic complete tear of left rotator cuff    Atherosclerosis of native coronary artery of native heart with stable angina pectoris (HCC) 01/05/2017   Essential hypertension 01/05/2017   Stented coronary artery 08/08/2011   Smoking hx 08/08/2011   Hyperlipidemia 01/30/2010   Coronary atherosclerosis 01/30/2010   Past Medical History:  Diagnosis Date   Coronary artery disease    a. 01/2010 NSTEMI/PCI: Mild diff LDA/LCX dzs, moderate ostial D2/3 dzs. RCA 60-70p, 99d (3.5x12 Xience DES).   GERD (gastroesophageal reflux disease)    Hyperlipidemia    Hypertension    MI (myocardial infarction) (HCC)    10/2004, 01/2010    Family History  Problem Relation Age of  Onset   Coronary artery disease Sister        stents placed   Diabetes Sister    Hyperlipidemia Sister    Cancer Father        lung   Hyperlipidemia Father     Past Surgical History:  Procedure Laterality Date   CARDIAC CATHETERIZATION     FRACTURE SURGERY Right    wrist  60yo   SHOULDER ARTHROSCOPY WITH OPEN ROTATOR CUFF REPAIR AND DISTAL CLAVICLE ACROMINECTOMY Left 10/23/2020   Procedure: LEFT SHOULDER ARTHROSCOPY, DEBRIDEMENT, MINI OPEN ROTATOR CUFF TEAR REPAIR, BICEPS TENODESIS;  Surgeon: Cammy Copa, MD;  Location: [facility] OR;  Service: Orthopedics;  Laterality: Left;   VASECTOMY  1984   Social History   Occupational History   Not on file  Tobacco Use   Smoking status: Former    Packs/day: 1.00    Years: 25.00    Pack years: 25.00    Types: Cigarettes    Quit date: 01/01/2010    Years since quitting: 10.8   Smokeless tobacco: Never  Vaping Use   Vaping Use: Never used  Substance and Sexual Activity   Alcohol use: Yes    Alcohol/week: 4.0 standard drinks    Types: 2 Cans of beer, 2 Standard drinks or equivalent per week    Comment: about every two  months   Drug use: No   Sexual activity: Yes    Birth control/protection: Surgical

## 2020-11-19 ENCOUNTER — Ambulatory Visit (INDEPENDENT_AMBULATORY_CARE_PROVIDER_SITE_OTHER): Payer: No Typology Code available for payment source | Admitting: Physical Therapy

## 2020-11-19 ENCOUNTER — Other Ambulatory Visit: Payer: Self-pay

## 2020-11-19 ENCOUNTER — Encounter: Payer: Self-pay | Admitting: Physical Therapy

## 2020-11-19 DIAGNOSIS — M25612 Stiffness of left shoulder, not elsewhere classified: Secondary | ICD-10-CM | POA: Diagnosis not present

## 2020-11-19 DIAGNOSIS — R293 Abnormal posture: Secondary | ICD-10-CM

## 2020-11-19 DIAGNOSIS — M25512 Pain in left shoulder: Secondary | ICD-10-CM

## 2020-11-19 DIAGNOSIS — M6281 Muscle weakness (generalized): Secondary | ICD-10-CM | POA: Diagnosis not present

## 2020-11-19 DIAGNOSIS — R600 Localized edema: Secondary | ICD-10-CM

## 2020-11-19 NOTE — Therapy (Signed)
[facility] [address] [place], [place], [address] Phone: [phone]   Fax:  [phone]  Physical Therapy Evaluation  Patient Details  Name: Erik PigeonRobert Perkins MRN: [id number] Date of Birth: 08/20/1960 Referring Provider (PT): Cammy CopaGregory Scott Dean, MD   Encounter Date: 11/19/2020   PT End of Session - 11/19/20 0915     Visit Number 1    Number of Visits 16    Date for PT Re-Evaluation 01/14/21    Authorization Type [place]    Authorization Time Period 06/18/20-12/15/20    Authorization - Visit Number 1    Authorization - Number of Visits 15    Progress Note Due on Visit 10    PT Start Time 0845    PT Stop Time 0916    PT Time Calculation (min) 31 min    Activity Tolerance Patient tolerated treatment well    Behavior During Therapy [facility] for tasks assessed/performed             Past Medical History:  Diagnosis Date   Coronary artery disease    a. 01/2010 NSTEMI/PCI: Mild diff LDA/LCX dzs, moderate ostial D2/3 dzs. RCA 60-70p, 99d (3.5x12 Xience DES).   GERD (gastroesophageal reflux disease)    Hyperlipidemia    Hypertension    MI (myocardial infarction) (HCC)    10/2004, 01/2010    Past Surgical History:  Procedure Laterality Date   CARDIAC CATHETERIZATION     FRACTURE SURGERY Right    wrist  60yo   SHOULDER ARTHROSCOPY WITH OPEN ROTATOR CUFF REPAIR AND DISTAL CLAVICLE ACROMINECTOMY Left 10/23/2020   Procedure: LEFT SHOULDER ARTHROSCOPY, DEBRIDEMENT, MINI OPEN ROTATOR CUFF TEAR REPAIR, BICEPS TENODESIS;  Surgeon: Cammy Copaean, Gregory Scott, MD;  Location: [facility] OR;  Service: Orthopedics;  Laterality: Left;   VASECTOMY  1984    There were no vitals filed for this visit.    Subjective Assessment - 11/19/20 0850     Subjective Pt is a 60 y/o male who presents to OPPT s/p Lt RTC repain with biceps tenodesis on 10/23/20.  He has a CPM at home up to 120 deg flexion and using everyday, 3x/day x 90 min.  Pt reports pain is manageable at this time.     Pertinent History HTN, stented coronary artery, CAD, MI    Limitations Lifting    Patient Stated Goals return to work, reach hand above head    Currently in Pain? Yes    Pain Score 5    up to 8/10; at best 0/10   Pain Location Shoulder    Pain Orientation Left    Pain Descriptors / Indicators Tiring;Sharp;Dull;Discomfort    Pain Type Acute pain;Surgical pain    Pain Onset 1 to 4 weeks ago    Pain Frequency Intermittent    Aggravating Factors  movement    Pain Relieving Factors medication, rest                [facility] PT Assessment - 11/19/20 0853       Assessment   Medical Diagnosis Z98.890 (ICD-10-CM) - Status post rotator cuff repair    Referring Provider (PT) Cammy CopaGregory Scott Dean, MD    Onset Date/Surgical Date 10/23/20    Hand Dominance Right    Next MD Visit 12/05/20    Prior Therapy none recently      Precautions   Precautions Shoulder    Type of Shoulder Precautions PT PROM and isometrics for 3 weeks then ok for strengthening 2-3/wk for 6wks  Restrictions   Weight Bearing Restrictions No      Balance Screen   Has the patient fallen in the past 6 months No   tripped going up stairs, near fall x 1   Has the patient had a decrease in activity level because of a fear of falling?  No    Is the patient reluctant to leave their home because of a fear of falling?  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#, reprtitive movements, walking up to 18,000 steps/day, occasional squatting, crawling, getting up/down from floor    Leisure play music, gym 3-4 days/wk MMA gym (pt does more traditional exercises)      Cognition   Overall Cognitive Status Within Functional Limits for tasks assessed      Observation/Other Assessments    Focus on Therapeutic Outcomes (FOTO)  36 (predicted 66)      Posture/Postural Control   Posture/Postural Control Postural limitations    Postural Limitations Rounded Shoulders;Forward head;Increased thoracic kyphosis      ROM / Strength   AROM / PROM / Strength PROM;Strength      PROM   PROM Assessment Site Shoulder    Right/Left Shoulder Left    Left Shoulder Flexion 98 Degrees    Left Shoulder ABduction 82 Degrees    Left Shoulder Internal Rotation 83 Degrees    Left Shoulder External Rotation 25 Degrees      Strength   Overall Strength Comments deferred due to current precautions                        Objective measurements completed on examination: See above findings.       [facility] Adult PT Treatment/Exercise - 11/19/20 0853       Exercises   Exercises Other Exercises    Other Exercises  see pt instructions - performed 2-3 reps of each isometric exercise with mod cues for technique                    PT Education - 11/19/20 0929     Education Details HEP    Person(s) Educated Patient    Methods Explanation;Demonstration;Handout    Comprehension Verbalized understanding;Returned demonstration;Need further instruction              PT Short Term Goals - 11/19/20 0934       PT SHORT TERM GOAL #1   Title independent with HEP    Time 4    Period Weeks    Status New    Target Date 12/17/20      PT SHORT TERM GOAL #2   Title Lt shoulder PROM improved by 15 deg flex, abduction, and er for improved function    Time 4    Period Weeks    Status New    Target Date 12/17/20               PT Long Term Goals - 11/19/20 0935       PT LONG TERM GOAL #1   Title independent with final HEP    Time 8    Period Days    Status New    Target Date  01/14/21      PT LONG TERM GOAL #2   Title improve Lt shoulder AROM to [facility] for improved function and mobility    Time 8    Period Weeks    Status New    Target Date 01/14/21       PT LONG TERM GOAL #3   Title demonstrate at least 4/5 Lt shoulder strength for improved function    Time 8    Period Weeks    Status New    Target Date 01/14/21      PT LONG TERM GOAL #4   Title FOTO score improved to 66 for improved function    Time 8    Period Weeks    Status New    Target Date 01/14/21      PT LONG TERM GOAL #5   Title report pain < 3/10 with activity for improved function    Time 8    Period Weeks    Status New    Target Date 01/14/21                    Plan - 11/19/20 0908     Clinical Impression Statement This 59yo male was referred to PT by Cammy Copa, MD s/p Lt RTC repair with biceps tenodesis on 10/23/2020.  He demonstrates decreased ROM and strength, with postural abnormalities and expected post op pain and swelling affecting functional mobility and return to work.  He will benefit from PT to address deficits listed.    Personal Factors and Comorbidities Comorbidity 3+;Profession    Comorbidities HTN, stented coronary artery, CAD, MI    Examination-Activity Limitations Carry;Lift;Reach Overhead    Examination-Participation Restrictions Cleaning;Community Activity;Driving;Yard Work;Occupation    Stability/Clinical Decision Making Stable/Uncomplicated    Clinical Decision Making Low    Rehab Potential Good    PT Frequency 2x / week    PT Duration 8 weeks    PT Treatment/Interventions ADLs/Self Care Home Management;Aquatic Therapy;Cryotherapy;Electrical Stimulation;Moist Heat;Therapeutic exercise;Therapeutic activities;Functional mobility training;Ultrasound;Neuromuscular re-education;Patient/family education;Manual techniques;Vasopneumatic Device;Taping;Dry needling;Passive range of motion;Scar mobilization    PT Next Visit Plan review isometrics, manual/PROM, scapular activation    PT Home Exercise Plan Access Code: [email or web address]    Consulted and Agree with Plan of Care Patient             Patient will benefit from skilled  therapeutic intervention in order to improve the following deficits and impairments:  Pain, Impaired UE functional use, Decreased strength, Decreased range of motion, Impaired flexibility, Postural dysfunction, Increased edema  Visit Diagnosis: Acute pain of left shoulder - Plan: PT plan of care cert/re-cert  Stiffness of left shoulder, not elsewhere classified - Plan: PT plan of care cert/re-cert  Muscle weakness (generalized) - Plan: PT plan of care cert/re-cert  Abnormal posture - Plan: PT plan of care cert/re-cert  Localized edema - Plan: PT plan of care cert/re-cert     Problem List Patient Active Problem List   Diagnosis Date Noted   Biceps tendonitis, left    Traumatic complete tear of left rotator cuff    Atherosclerosis of native coronary artery of native heart with stable angina pectoris (HCC) 01/05/2017   Essential hypertension 01/05/2017   Stented coronary artery 08/08/2011   Smoking hx 08/08/2011   Hyperlipidemia 01/30/2010   Coronary atherosclerosis 01/30/2010      Clarita Crane, PT, DPT 11/19/20 9:42 AM      [facility] Physical Therapy [address] [place], [place], [address] Phone: [phone]  Fax:  [phone]  Name: Clifford Benninger MRN: [id number] Date of Birth: [birth year]

## 2020-11-19 NOTE — Patient Instructions (Signed)
Access Code: [email or web address] URL: [email or web address] Date: 11/19/2020 Prepared by: Moshe Cipro  Exercises Isometric Shoulder Flexion at Wall - 2-3 x daily - 7 x weekly - 1 sets - 10 reps - 5 sec hold Isometric Shoulder External Rotation at Wall - 2-3 x daily - 7 x weekly - 1 sets - 10 reps - 5 sec hold Standing Isometric Shoulder Internal Rotation at Doorway - 2-3 x daily - 7 x weekly - 1 sets - 10 reps - 5 sec hold Isometric Shoulder Abduction at Wall - 2-3 x daily - 7 x weekly - 1 sets - 10 reps - 5 sec hold Isometric Shoulder Extension at Wall - 2-3 x daily - 7 x weekly - 1 sets - 10 reps - 5 sec hold

## 2020-11-21 ENCOUNTER — Encounter: Payer: No Typology Code available for payment source | Admitting: Physical Therapy

## 2020-11-22 ENCOUNTER — Other Ambulatory Visit: Payer: Self-pay

## 2020-11-22 ENCOUNTER — Ambulatory Visit (INDEPENDENT_AMBULATORY_CARE_PROVIDER_SITE_OTHER): Payer: No Typology Code available for payment source | Admitting: Physical Therapy

## 2020-11-22 ENCOUNTER — Encounter: Payer: Self-pay | Admitting: Physical Therapy

## 2020-11-22 DIAGNOSIS — M25512 Pain in left shoulder: Secondary | ICD-10-CM | POA: Diagnosis not present

## 2020-11-22 DIAGNOSIS — R293 Abnormal posture: Secondary | ICD-10-CM

## 2020-11-22 DIAGNOSIS — M6281 Muscle weakness (generalized): Secondary | ICD-10-CM

## 2020-11-22 DIAGNOSIS — M25612 Stiffness of left shoulder, not elsewhere classified: Secondary | ICD-10-CM | POA: Diagnosis not present

## 2020-11-22 DIAGNOSIS — R600 Localized edema: Secondary | ICD-10-CM

## 2020-11-22 NOTE — Therapy (Signed)
[facility] Physical Therapy [address] [place], [place], [address] Phone: [phone]   Fax:  [phone]  Physical Therapy Treatment  Patient Details  Name: Erik Perkins MRN: [id number] Date of Birth: 1960-07-24 Referring Provider (PT): Cammy Copa, MD   Encounter Date: 11/22/2020   PT End of Session - 11/22/20 0917     Visit Number 2    Number of Visits 16    Date for PT Re-Evaluation 01/14/21    Authorization Type VA    Authorization Time Period 06/18/20-12/15/20    Authorization - Visit Number 2    Authorization - Number of Visits 15    Progress Note Due on Visit 10    PT Start Time 0845    PT Stop Time 0915    PT Time Calculation (min) 30 min    Activity Tolerance Patient tolerated treatment well    Behavior During Therapy [facility] for tasks assessed/performed             Past Medical History:  Diagnosis Date   Coronary artery disease    a. 01/2010 NSTEMI/PCI: Mild diff LDA/LCX dzs, moderate ostial D2/3 dzs. RCA 60-70p, 99d (3.5x12 Xience DES).   GERD (gastroesophageal reflux disease)    Hyperlipidemia    Hypertension    MI (myocardial infarction) (HCC)    10/2004, 01/2010    Past Surgical History:  Procedure Laterality Date   CARDIAC CATHETERIZATION     FRACTURE SURGERY Right    wrist  60yo   SHOULDER ARTHROSCOPY WITH OPEN ROTATOR CUFF REPAIR AND DISTAL CLAVICLE ACROMINECTOMY Left 10/23/2020   Procedure: LEFT SHOULDER ARTHROSCOPY, DEBRIDEMENT, MINI OPEN ROTATOR CUFF TEAR REPAIR, BICEPS TENODESIS;  Surgeon: Cammy Copa, MD;  Location: [facility] OR;  Service: Orthopedics;  Laterality: Left;   VASECTOMY  1984    There were no vitals filed for this visit.   Subjective Assessment - 11/22/20 0847     Subjective shoulder still gets tight but loosens up quickly.    Pertinent History HTN, stented coronary artery, CAD, MI    Limitations Lifting    Patient Stated Goals return to work, reach hand above head    Currently in Pain? Yes     Pain Score 5     Pain Location Shoulder    Pain Orientation Left    Pain Descriptors / Indicators Tiring;Sharp;Dull;Discomfort    Pain Type Acute pain;Surgical pain    Pain Onset 1 to 4 weeks ago    Pain Frequency Intermittent    Aggravating Factors  movement    Pain Relieving Factors mes, rest                               [facility] Adult PT Treatment/Exercise - 11/22/20 0904       Exercises   Exercises Shoulder      Shoulder Exercises: Standing   Retraction 20 reps   5 sec hold     Shoulder Exercises: Isometric Strengthening   Flexion Limitations 15x5"    Extension Limitations 15x5"    External Rotation Limitations 15x5"    Internal Rotation Limitations 15x5"    ABduction Limitations 15x5"      Manual Therapy   Manual Therapy Passive ROM    Passive ROM Lt shoulder all directions to tolerance                      PT Short Term Goals - 11/19/20 8882  PT SHORT TERM GOAL #1   Title independent with HEP    Time 4    Period Weeks    Status New    Target Date 12/17/20      PT SHORT TERM GOAL #2   Title Lt shoulder PROM improved by 15 deg flex, abduction, and er for improved function    Time 4    Period Weeks    Status New    Target Date 12/17/20               PT Long Term Goals - 11/19/20 0935       PT LONG TERM GOAL #1   Title independent with final HEP    Time 8    Period Days    Status New    Target Date 01/14/21      PT LONG TERM GOAL #2   Title improve Lt shoulder AROM to [facility] for improved function and mobility    Time 8    Period Weeks    Status New    Target Date 01/14/21      PT LONG TERM GOAL #3   Title demonstrate at least 4/5 Lt shoulder strength for improved function    Time 8    Period Weeks    Status New    Target Date 01/14/21      PT LONG TERM GOAL #4   Title FOTO score improved to 66 for improved function    Time 8    Period Weeks    Status New    Target Date 01/14/21      PT LONG TERM  GOAL #5   Title report pain < 3/10 with activity for improved function    Time 8    Period Weeks    Status New    Target Date 01/14/21                   Plan - 11/22/20 0918     Clinical Impression Statement Pt tolerated session well today with focus on PROM and isometrics as allowed by protocol.  Will continue to benefit from PT to maximize function.    Personal Factors and Comorbidities Comorbidity 3+;Profession    Comorbidities HTN, stented coronary artery, CAD, MI    Examination-Activity Limitations Carry;Lift;Reach Overhead    Examination-Participation Restrictions Cleaning;Community Activity;Driving;Yard Work;Occupation    Stability/Clinical Decision Making Stable/Uncomplicated    Rehab Potential Good    PT Frequency 2x / week    PT Duration 8 weeks    PT Treatment/Interventions ADLs/Self Care Home Management;Aquatic Therapy;Cryotherapy;Electrical Stimulation;Moist Heat;Therapeutic exercise;Therapeutic activities;Functional mobility training;Ultrasound;Neuromuscular re-education;Patient/family education;Manual techniques;Vasopneumatic Device;Taping;Dry needling;Passive range of motion;Scar mobilization    PT Next Visit Plan isometrics, manual/PROM, scapular activation    PT Home Exercise Plan Access Code: [email or web address]    Consulted and Agree with Plan of Care Patient             Patient will benefit from skilled therapeutic intervention in order to improve the following deficits and impairments:  Pain, Impaired UE functional use, Decreased strength, Decreased range of motion, Impaired flexibility, Postural dysfunction, Increased edema  Visit Diagnosis: Acute pain of left shoulder  Stiffness of left shoulder, not elsewhere classified  Muscle weakness (generalized)  Abnormal posture  Localized edema     Problem List Patient Active Problem List   Diagnosis Date Noted   Biceps tendonitis, left    Traumatic complete tear of left rotator cuff    Atherosclerosis  of native coronary artery of  native heart with stable angina pectoris (HCC) 01/05/2017   Essential hypertension 01/05/2017   Stented coronary artery 08/08/2011   Smoking hx 08/08/2011   Hyperlipidemia 01/30/2010   Coronary atherosclerosis 01/30/2010      Clarita Crane, PT, DPT 11/22/20 9:20 AM     [facility] Physical Therapy [address] [place], [place], [address] Phone: [phone]   Fax:  [phone]  Name: Wissam Resor MRN: [id number] Date of Birth: May 23, 1961

## 2020-11-27 ENCOUNTER — Encounter: Payer: Self-pay | Admitting: Physical Therapy

## 2020-11-27 ENCOUNTER — Ambulatory Visit (INDEPENDENT_AMBULATORY_CARE_PROVIDER_SITE_OTHER): Payer: No Typology Code available for payment source | Admitting: Physical Therapy

## 2020-11-27 ENCOUNTER — Other Ambulatory Visit: Payer: Self-pay

## 2020-11-27 DIAGNOSIS — M25512 Pain in left shoulder: Secondary | ICD-10-CM | POA: Diagnosis not present

## 2020-11-27 DIAGNOSIS — M25612 Stiffness of left shoulder, not elsewhere classified: Secondary | ICD-10-CM

## 2020-11-27 DIAGNOSIS — R293 Abnormal posture: Secondary | ICD-10-CM

## 2020-11-27 DIAGNOSIS — M6281 Muscle weakness (generalized): Secondary | ICD-10-CM

## 2020-11-27 DIAGNOSIS — R600 Localized edema: Secondary | ICD-10-CM

## 2020-11-27 NOTE — Therapy (Signed)
[facility] Physical Therapy [address] [place], [place], [address] Phone: [phone]   Fax:  [phone]  Physical Therapy Treatment  Patient Details  Name: Erik Perkins MRN: [id number] Date of Birth: Jun 06, 1961 Referring Provider (PT): Cammy Copa, MD   Encounter Date: 11/27/2020   PT End of Session - 11/27/20 0923     Visit Number 3    Number of Visits 16    Date for PT Re-Evaluation 01/14/21    Authorization Type [place]    Authorization Time Period 06/18/20-12/15/20    Authorization - Visit Number 3    Authorization - Number of Visits 15    Progress Note Due on Visit 10    PT Start Time 0843    PT Stop Time 0922    PT Time Calculation (min) 39 min    Activity Tolerance Patient tolerated treatment well    Behavior During Therapy [facility] for tasks assessed/performed             Past Medical History:  Diagnosis Date   Coronary artery disease    a. 01/2010 NSTEMI/PCI: Mild diff LDA/LCX dzs, moderate ostial D2/3 dzs. RCA 60-70p, 99d (3.5x12 Xience DES).   GERD (gastroesophageal reflux disease)    Hyperlipidemia    Hypertension    MI (myocardial infarction) (HCC)    10/2004, 01/2010    Past Surgical History:  Procedure Laterality Date   CARDIAC CATHETERIZATION     FRACTURE SURGERY Right    wrist  60yo   SHOULDER ARTHROSCOPY WITH OPEN ROTATOR CUFF REPAIR AND DISTAL CLAVICLE ACROMINECTOMY Left 10/23/2020   Procedure: LEFT SHOULDER ARTHROSCOPY, DEBRIDEMENT, MINI OPEN ROTATOR CUFF TEAR REPAIR, BICEPS TENODESIS;  Surgeon: Cammy Copa, MD;  Location: [facility] OR;  Service: Orthopedics;  Laterality: Left;   VASECTOMY  1984    There were no vitals filed for this visit.   Subjective Assessment - 11/27/20 0844     Subjective shoulder "hurts all the time."  rated 3-4/10    Pertinent History HTN, stented coronary artery, CAD, MI    Limitations Lifting    Patient Stated Goals return to work, reach hand above head    Currently in Pain? Yes    Pain  Score 4     Pain Location Shoulder    Pain Orientation Left    Pain Descriptors / Indicators Aching;Discomfort    Pain Type Acute pain;Surgical pain    Pain Onset 1 to 4 weeks ago    Pain Frequency Intermittent    Aggravating Factors  movement    Pain Relieving Factors meds, rest                               [facility] Adult PT Treatment/Exercise - 11/27/20 0849       Shoulder Exercises: Seated   Retraction Both;20 reps   5 sec     Shoulder Exercises: Isometric Strengthening   Flexion Limitations 15x5"    Extension Limitations 15x5"    External Rotation Limitations 15x5"    Internal Rotation Limitations 15x5"    ABduction Limitations 15x5"      Shoulder Exercises: Stretch   Table Stretch - Flexion --   20 reps, 5 sec hold; Lt   Table Stretch - Abduction --   20 reps, 5 sec hold; Lt   Table Stretch - External Rotation --   20 reps, 5 sec hold; Lt     Manual Therapy   Passive ROM Lt shoulder  all directions to tolerance                    PT Education - 11/27/20 0923     Education Details HEP    Person(s) Educated Patient    Methods Explanation;Demonstration;Handout    Comprehension Verbalized understanding;Returned demonstration;Need further instruction              PT Short Term Goals - 11/19/20 0934       PT SHORT TERM GOAL #1   Title independent with HEP    Time 4    Period Weeks    Status New    Target Date 12/17/20      PT SHORT TERM GOAL #2   Title Lt shoulder PROM improved by 15 deg flex, abduction, and er for improved function    Time 4    Period Weeks    Status New    Target Date 12/17/20               PT Long Term Goals - 11/19/20 0935       PT LONG TERM GOAL #1   Title independent with final HEP    Time 8    Period Days    Status New    Target Date 01/14/21      PT LONG TERM GOAL #2   Title improve Lt shoulder AROM to [facility] for improved function and mobility    Time 8    Period Weeks    Status New     Target Date 01/14/21      PT LONG TERM GOAL #3   Title demonstrate at least 4/5 Lt shoulder strength for improved function    Time 8    Period Weeks    Status New    Target Date 01/14/21      PT LONG TERM GOAL #4   Title FOTO score improved to 66 for improved function    Time 8    Period Weeks    Status New    Target Date 01/14/21      PT LONG TERM GOAL #5   Title report pain < 3/10 with activity for improved function    Time 8    Period Weeks    Status New    Target Date 01/14/21                   Plan - 11/27/20 0924     Clinical Impression Statement Added PROM seated table slides to HEP today with expected discomfort and fatigue after session.  Will continue to benefit from PT to maximize function.    Personal Factors and Comorbidities Comorbidity 3+;Profession    Comorbidities HTN, stented coronary artery, CAD, MI    Examination-Activity Limitations Carry;Lift;Reach Overhead    Examination-Participation Restrictions Cleaning;Community Activity;Driving;Yard Work;Occupation    Stability/Clinical Decision Making Stable/Uncomplicated    Rehab Potential Good    PT Frequency 2x / week    PT Duration 8 weeks    PT Treatment/Interventions ADLs/Self Care Home Management;Aquatic Therapy;Cryotherapy;Electrical Stimulation;Moist Heat;Therapeutic exercise;Therapeutic activities;Functional mobility training;Ultrasound;Neuromuscular re-education;Patient/family education;Manual techniques;Vasopneumatic Device;Taping;Dry needling;Passive range of motion;Scar mobilization    PT Next Visit Plan isometrics, manual/PROM, scapular activation    PT Home Exercise Plan Access Code: [email or web address]    Consulted and Agree with Plan of Care Patient             Patient will benefit from skilled therapeutic intervention in order to improve the following deficits and impairments:  Pain, Impaired  UE functional use, Decreased strength, Decreased range of motion, Impaired flexibility, Postural  dysfunction, Increased edema  Visit Diagnosis: Acute pain of left shoulder  Stiffness of left shoulder, not elsewhere classified  Muscle weakness (generalized)  Abnormal posture  Localized edema     Problem List Patient Active Problem List   Diagnosis Date Noted   Biceps tendonitis, left    Traumatic complete tear of left rotator cuff    Atherosclerosis of native coronary artery of native heart with stable angina pectoris (HCC) 01/05/2017   Essential hypertension 01/05/2017   Stented coronary artery 08/08/2011   Smoking hx 08/08/2011   Hyperlipidemia 01/30/2010   Coronary atherosclerosis 01/30/2010      Clarita Crane, PT, DPT 11/27/20 9:25 AM      [facility] Physical Therapy [address] [place], [place], [address] Phone: [phone]   Fax:  [phone]  Name: Brewster Wolters MRN: [id number] Date of Birth: 16-Jun-1960

## 2020-11-27 NOTE — Patient Instructions (Signed)
Access Code: [email or web address] URL: [email or web address] Date: 11/27/2020 Prepared by: Moshe Cipro  Exercises Isometric Shoulder Flexion at Wall - 2-3 x daily - 7 x weekly - 1 sets - 10 reps - 5 sec hold Isometric Shoulder External Rotation at Wall - 2-3 x daily - 7 x weekly - 1 sets - 10 reps - 5 sec hold Standing Isometric Shoulder Internal Rotation at Doorway - 2-3 x daily - 7 x weekly - 1 sets - 10 reps - 5 sec hold Isometric Shoulder Abduction at Wall - 2-3 x daily - 7 x weekly - 1 sets - 10 reps - 5 sec hold Isometric Shoulder Extension at Wall - 2-3 x daily - 7 x weekly - 1 sets - 10 reps - 5 sec hold Seated Shoulder Flexion Towel Slide at Table Top - 2-3 x daily - 7 x weekly - 2 sets - 10 reps Seated Shoulder Scaption Slide at Table Top with Forearm in Neutral - 2-3 x daily - 7 x weekly - 2 sets - 10 reps Seated Shoulder External Rotation PROM on Table - 2-3 x daily - 7 x weekly - 2 sets - 10 reps

## 2020-11-29 ENCOUNTER — Other Ambulatory Visit: Payer: Self-pay

## 2020-11-29 ENCOUNTER — Encounter: Payer: Self-pay | Admitting: Physical Therapy

## 2020-11-29 ENCOUNTER — Ambulatory Visit (INDEPENDENT_AMBULATORY_CARE_PROVIDER_SITE_OTHER): Payer: No Typology Code available for payment source | Admitting: Physical Therapy

## 2020-11-29 DIAGNOSIS — M25512 Pain in left shoulder: Secondary | ICD-10-CM | POA: Diagnosis not present

## 2020-11-29 DIAGNOSIS — R600 Localized edema: Secondary | ICD-10-CM

## 2020-11-29 DIAGNOSIS — M6281 Muscle weakness (generalized): Secondary | ICD-10-CM

## 2020-11-29 DIAGNOSIS — M25612 Stiffness of left shoulder, not elsewhere classified: Secondary | ICD-10-CM | POA: Diagnosis not present

## 2020-11-29 DIAGNOSIS — R293 Abnormal posture: Secondary | ICD-10-CM

## 2020-11-29 NOTE — Therapy (Signed)
[facility] Physical Therapy [address] [place], [place], [address] Phone: [phone]   Fax:  [phone]  Physical Therapy Treatment  Patient Details  Name: Erik Perkins MRN: [id number] Date of Birth: 1961/05/18 Referring Provider (PT): Cammy Copa, MD   Encounter Date: 11/29/2020   PT End of Session - 11/29/20 1053     Visit Number 4    Number of Visits 16    Date for PT Re-Evaluation 01/14/21    Authorization Type [place]    Authorization Time Period 06/18/20-12/15/20    Authorization - Visit Number 4    Authorization - Number of Visits 15    Progress Note Due on Visit 10    PT Start Time 1014    PT Stop Time 1044    PT Time Calculation (min) 30 min    Activity Tolerance Patient tolerated treatment well    Behavior During Therapy [facility] for tasks assessed/performed             Past Medical History:  Diagnosis Date   Coronary artery disease    a. 01/2010 NSTEMI/PCI: Mild diff LDA/LCX dzs, moderate ostial D2/3 dzs. RCA 60-70p, 99d (3.5x12 Xience DES).   GERD (gastroesophageal reflux disease)    Hyperlipidemia    Hypertension    MI (myocardial infarction) (HCC)    10/2004, 01/2010    Past Surgical History:  Procedure Laterality Date   CARDIAC CATHETERIZATION     FRACTURE SURGERY Right    wrist  60yo   SHOULDER ARTHROSCOPY WITH OPEN ROTATOR CUFF REPAIR AND DISTAL CLAVICLE ACROMINECTOMY Left 10/23/2020   Procedure: LEFT SHOULDER ARTHROSCOPY, DEBRIDEMENT, MINI OPEN ROTATOR CUFF TEAR REPAIR, BICEPS TENODESIS;  Surgeon: Cammy Copa, MD;  Location: [facility] OR;  Service: Orthopedics;  Laterality: Left;   VASECTOMY  1984    There were no vitals filed for this visit.   Subjective Assessment - 11/29/20 1012     Subjective shoulder is a little more sore today since adding table slides at home    Pertinent History HTN, stented coronary artery, CAD, MI    Limitations Lifting    Patient Stated Goals return to work, reach hand above head     Currently in Pain? Yes    Pain Score 7     Pain Location Shoulder    Pain Orientation Left    Pain Descriptors / Indicators Aching;Sore;Discomfort;Dull    Pain Type Acute pain;Surgical pain    Pain Onset 1 to 4 weeks ago    Pain Frequency Intermittent    Aggravating Factors  movement    Pain Relieving Factors meds, rest                               [facility] Adult PT Treatment/Exercise - 11/29/20 1015       Shoulder Exercises: Isometric Strengthening   Flexion Limitations 20x5"    Extension Limitations 20x5"    External Rotation Limitations 20x5"    Internal Rotation Limitations 20x5"    ABduction Limitations 20x5"      Shoulder Exercises: Stretch   Table Stretch - Flexion --   20 reps, 5 sec hold; Lt   Table Stretch - Abduction --   20 reps, 5 sec hold; Lt   Table Stretch - External Rotation --   20 reps, 5 sec hold; Lt     Manual Therapy   Passive ROM Lt shoulder all directions to tolerance; gr 1-2 inf and a/p mobs  PT Short Term Goals - 11/19/20 0934       PT SHORT TERM GOAL #1   Title independent with HEP    Time 4    Period Weeks    Status New    Target Date 12/17/20      PT SHORT TERM GOAL #2   Title Lt shoulder PROM improved by 15 deg flex, abduction, and er for improved function    Time 4    Period Weeks    Status New    Target Date 12/17/20               PT Long Term Goals - 11/19/20 0935       PT LONG TERM GOAL #1   Title independent with final HEP    Time 8    Period Days    Status New    Target Date 01/14/21      PT LONG TERM GOAL #2   Title improve Lt shoulder AROM to [facility] for improved function and mobility    Time 8    Period Weeks    Status New    Target Date 01/14/21      PT LONG TERM GOAL #3   Title demonstrate at least 4/5 Lt shoulder strength for improved function    Time 8    Period Weeks    Status New    Target Date 01/14/21      PT LONG TERM GOAL #4   Title FOTO  score improved to 66 for improved function    Time 8    Period Weeks    Status New    Target Date 01/14/21      PT LONG TERM GOAL #5   Title report pain < 3/10 with activity for improved function    Time 8    Period Weeks    Status New    Target Date 01/14/21                   Plan - 11/29/20 1055     Clinical Impression Statement Pt reports new HEP has increased soreness in shoulder so advised to perform within tolerance.  Will be 6 weeks post-op next week so hopeful to begin AROM exercises next week after MD appt.    Personal Factors and Comorbidities Comorbidity 3+;Profession    Comorbidities HTN, stented coronary artery, CAD, MI    Examination-Activity Limitations Carry;Lift;Reach Overhead    Examination-Participation Restrictions Cleaning;Community Activity;Driving;Yard Work;Occupation    Stability/Clinical Decision Making Stable/Uncomplicated    Rehab Potential Good    PT Frequency 2x / week    PT Duration 8 weeks    PT Treatment/Interventions ADLs/Self Care Home Management;Aquatic Therapy;Cryotherapy;Electrical Stimulation;Moist Heat;Therapeutic exercise;Therapeutic activities;Functional mobility training;Ultrasound;Neuromuscular re-education;Patient/family education;Manual techniques;Vasopneumatic Device;Taping;Dry needling;Passive range of motion;Scar mobilization    PT Next Visit Plan isometrics, manual/PROM, scapular activation, needs MD note    PT Home Exercise Plan Access Code: [email or web address]    Consulted and Agree with Plan of Care Patient             Patient will benefit from skilled therapeutic intervention in order to improve the following deficits and impairments:  Pain, Impaired UE functional use, Decreased strength, Decreased range of motion, Impaired flexibility, Postural dysfunction, Increased edema  Visit Diagnosis: Acute pain of left shoulder  Stiffness of left shoulder, not elsewhere classified  Muscle weakness (generalized)  Abnormal  posture  Localized edema     Problem List Patient Active Problem List   Diagnosis Date  Noted   Biceps tendonitis, left    Traumatic complete tear of left rotator cuff    Atherosclerosis of native coronary artery of native heart with stable angina pectoris (HCC) 01/05/2017   Essential hypertension 01/05/2017   Stented coronary artery 08/08/2011   Smoking hx 08/08/2011   Hyperlipidemia 01/30/2010   Coronary atherosclerosis 01/30/2010      Clarita Crane, PT, DPT 11/29/20 10:59 AM     [facility] Physical Therapy [address] [place], [place], [address] Phone: [phone]   Fax:  [phone]  Name: Erik Perkins MRN: [id number] Date of Birth: 1961-03-27

## 2020-12-04 ENCOUNTER — Other Ambulatory Visit: Payer: Self-pay

## 2020-12-04 ENCOUNTER — Encounter: Payer: Self-pay | Admitting: Physical Therapy

## 2020-12-04 ENCOUNTER — Ambulatory Visit (INDEPENDENT_AMBULATORY_CARE_PROVIDER_SITE_OTHER): Payer: No Typology Code available for payment source | Admitting: Physical Therapy

## 2020-12-04 DIAGNOSIS — M6281 Muscle weakness (generalized): Secondary | ICD-10-CM | POA: Diagnosis not present

## 2020-12-04 DIAGNOSIS — R600 Localized edema: Secondary | ICD-10-CM

## 2020-12-04 DIAGNOSIS — R293 Abnormal posture: Secondary | ICD-10-CM

## 2020-12-04 DIAGNOSIS — M25512 Pain in left shoulder: Secondary | ICD-10-CM | POA: Diagnosis not present

## 2020-12-04 DIAGNOSIS — M25612 Stiffness of left shoulder, not elsewhere classified: Secondary | ICD-10-CM | POA: Diagnosis not present

## 2020-12-04 NOTE — Therapy (Signed)
[facility] [place], [place], [address] Phone: [phone]   Fax:  [phone]  Physical Therapy Treatment  Patient Details  Name: Erik Perkins MRN: [id number] Date of Birth: 03-01-1961 Referring Provider (PT): Meredith Pel, MD   Encounter Date: 12/04/2020   PT End of Session - 12/04/20 0832     Visit Number 5    Number of Visits 16    Date for PT Re-Evaluation 01/14/21    Authorization Type VA    Authorization Time Period 06/18/20-12/15/20    Authorization - Visit Number 5    Authorization - Number of Visits 15    Progress Note Due on Visit 10    PT Start Time 0800    PT Stop Time 0832    PT Time Calculation (min) 32 min    Activity Tolerance Patient tolerated treatment well    Behavior During Therapy [facility] for tasks assessed/performed             Past Medical History:  Diagnosis Date   Coronary artery disease    a. 01/2010 NSTEMI/PCI: Mild diff LDA/LCX dzs, moderate ostial D2/3 dzs. RCA 60-70p, 99d (3.5x12 Xience DES).   GERD (gastroesophageal reflux disease)    Hyperlipidemia    Hypertension    MI (myocardial infarction) ([place])    10/2004, 01/2010    Past Surgical History:  Procedure Laterality Date   CARDIAC CATHETERIZATION     FRACTURE SURGERY Right    wrist  60yo   SHOULDER ARTHROSCOPY WITH OPEN ROTATOR CUFF REPAIR AND DISTAL CLAVICLE ACROMINECTOMY Left 10/23/2020   Procedure: LEFT SHOULDER ARTHROSCOPY, DEBRIDEMENT, MINI OPEN ROTATOR CUFF TEAR REPAIR, BICEPS TENODESIS;  Surgeon: Meredith Pel, MD;  Location: [place];  Service: Orthopedics;  Laterality: Left;   VASECTOMY  1984    There were no vitals filed for this visit.   Subjective Assessment - 12/04/20 0756     Subjective shoulder is still sore and uncomfortable    Pertinent History HTN, stented coronary artery, CAD, MI    Limitations Lifting    Patient Stated Goals return to work, reach hand above head    Currently in Pain? Yes    Pain Score  6     Pain Location Shoulder    Pain Orientation Left    Pain Descriptors / Indicators Aching;Dull;Sore;Discomfort    Pain Type Acute pain;Surgical pain    Pain Onset 1 to 4 weeks ago    Pain Frequency Intermittent    Aggravating Factors  movement    Pain Relieving Factors meds, rest                [facility] PT Assessment - 12/04/20 0816       Assessment   Medical Diagnosis Z98.890 (ICD-10-CM) - Status post rotator cuff repair    Referring Provider (PT) Meredith Pel, MD    Onset Date/Surgical Date 10/23/20      PROM   Right/Left Shoulder Left    Left Shoulder Flexion 118 Degrees    Left Shoulder ABduction 100 Degrees    Left Shoulder Internal Rotation 83 Degrees   60 deg abdct   Left Shoulder External Rotation 45 Degrees   45 deg abdct                          [facility] Adult PT Treatment/Exercise - 12/04/20 0803       Shoulder Exercises: Seated   Retraction Both   3x10; 5 sec hold  Shoulder Exercises: Pulleys   Flexion 3 minutes      Shoulder Exercises: Therapy Ball   Flexion Both;20 reps    Flexion Limitations 5 sec hold      Shoulder Exercises: Isometric Strengthening   Flexion Limitations 20x5"    Extension Limitations 20x5"    External Rotation Limitations 20x5"    Internal Rotation Limitations 20x5"    ABduction Limitations 20x5"      Manual Therapy   Passive ROM Lt shoulder all directions to tolerance; gr 1-2 inf and a/p mobs                      PT Short Term Goals - 12/04/20 0832       PT SHORT TERM GOAL #1   Title independent with HEP    Time 4    Period Weeks    Status Achieved    Target Date 12/17/20      PT SHORT TERM GOAL #2   Title Lt shoulder PROM improved by 15 deg flex, abduction, and er for improved function    Time 4    Period Weeks    Status Achieved    Target Date 12/17/20               PT Long Term Goals - 12/04/20 3299       PT LONG TERM GOAL #1   Title independent with final  HEP    Time 8    Period Days    Status On-going    Target Date 01/14/21      PT LONG TERM GOAL #2   Title improve Lt shoulder AROM to [facility] for improved function and mobility    Time 8    Period Weeks    Status On-going      PT LONG TERM GOAL #3   Title demonstrate at least 4/5 Lt shoulder strength for improved function    Time 8    Period Weeks    Status On-going      PT LONG TERM GOAL #4   Title FOTO score improved to 66 for improved function    Time 8    Period Weeks    Status On-going      PT LONG TERM GOAL #5   Title report pain < 3/10 with activity for improved function    Time 8    Period Weeks    Status On-going                   Plan - 12/04/20 2426     Clinical Impression Statement Pt tolerated session well today, and has met both STGs at this time.  PROM has improved in all directions except IR which was already near normal to begin with.  He is progressing well with PT within his protocol and hopeful to begin AROM and strengthening at next session.    Personal Factors and Comorbidities Comorbidity 3+;Profession    Comorbidities HTN, stented coronary artery, CAD, MI    Examination-Activity Limitations Carry;Lift;Reach Overhead    Examination-Participation Restrictions Cleaning;Community Activity;Driving;Yard Work;Occupation    Stability/Clinical Decision Making Stable/Uncomplicated    Rehab Potential Good    PT Frequency 2x / week    PT Duration 8 weeks    PT Treatment/Interventions ADLs/Self Care Home Management;Aquatic Therapy;Cryotherapy;Electrical Stimulation;Moist Heat;Therapeutic exercise;Therapeutic activities;Functional mobility training;Ultrasound;Neuromuscular re-education;Patient/family education;Manual techniques;Vasopneumatic Device;Taping;Dry needling;Passive range of motion;Scar mobilization    PT Next Visit Plan see what MD says and hopefully begin AROM/strengthening  PT Home Exercise Plan Access Code: [email or web address]    Consulted and Agree  with Plan of Care Patient             Patient will benefit from skilled therapeutic intervention in order to improve the following deficits and impairments:  Pain, Impaired UE functional use, Decreased strength, Decreased range of motion, Impaired flexibility, Postural dysfunction, Increased edema  Visit Diagnosis: Acute pain of left shoulder  Stiffness of left shoulder, not elsewhere classified  Muscle weakness (generalized)  Abnormal posture  Localized edema     Problem List Patient Active Problem List   Diagnosis Date Noted   Biceps tendonitis, left    Traumatic complete tear of left rotator cuff    Atherosclerosis of native coronary artery of native heart with stable angina pectoris ([place]) 01/05/2017   Essential hypertension 01/05/2017   Stented coronary artery 08/08/2011   Smoking hx 08/08/2011   Hyperlipidemia 01/30/2010   Coronary atherosclerosis 01/30/2010      Laureen Abrahams, PT, DPT 12/04/20 8:35 AM      [facility] Physical Therapy [address] [place], [place], [address] Phone: [phone]   Fax:  [phone]  Name: Erik Perkins MRN: [id number] Date of Birth: Nov 25, 1960

## 2020-12-05 ENCOUNTER — Ambulatory Visit (INDEPENDENT_AMBULATORY_CARE_PROVIDER_SITE_OTHER): Payer: No Typology Code available for payment source | Admitting: Orthopedic Surgery

## 2020-12-05 DIAGNOSIS — Z9889 Other specified postprocedural states: Secondary | ICD-10-CM

## 2020-12-05 MED ORDER — MELOXICAM 15 MG PO TABS: 15.0000 mg | ORAL_TABLET | Freq: Every day | ORAL | 0 refills | Status: DC | PRN

## 2020-12-06 ENCOUNTER — Encounter: Payer: Self-pay | Admitting: Physical Therapy

## 2020-12-06 ENCOUNTER — Ambulatory Visit (INDEPENDENT_AMBULATORY_CARE_PROVIDER_SITE_OTHER): Payer: No Typology Code available for payment source | Admitting: Physical Therapy

## 2020-12-06 ENCOUNTER — Other Ambulatory Visit: Payer: Self-pay

## 2020-12-06 DIAGNOSIS — M25512 Pain in left shoulder: Secondary | ICD-10-CM | POA: Diagnosis not present

## 2020-12-06 DIAGNOSIS — R600 Localized edema: Secondary | ICD-10-CM

## 2020-12-06 DIAGNOSIS — M6281 Muscle weakness (generalized): Secondary | ICD-10-CM | POA: Diagnosis not present

## 2020-12-06 DIAGNOSIS — R293 Abnormal posture: Secondary | ICD-10-CM | POA: Diagnosis not present

## 2020-12-06 DIAGNOSIS — M25612 Stiffness of left shoulder, not elsewhere classified: Secondary | ICD-10-CM

## 2020-12-06 NOTE — Therapy (Addendum)
[facility] Physical Therapy [address] [place], [place], [address] Phone: [phone]   Fax:  [phone]  Physical Therapy Treatment  Patient Details  Name: Erik Perkins MRN: [id number] Date of Birth: 05/31/1961 Referring Provider (PT): Cammy Copa, MD   Encounter Date: 12/06/2020   PT End of Session - 12/06/20 0906     Visit Number 6    Number of Visits 16    Date for PT Re-Evaluation 01/14/21    Authorization Type VA    Authorization Time Period 06/18/20-12/15/20    Authorization - Visit Number 6    Authorization - Number of Visits 15    Progress Note Due on Visit 10    PT Start Time 0845    PT Stop Time 0924    PT Time Calculation (min) 39 min    Activity Tolerance Patient tolerated treatment well    Behavior During Therapy [facility] for tasks assessed/performed             Past Medical History:  Diagnosis Date   Coronary artery disease    a. 01/2010 NSTEMI/PCI: Mild diff LDA/LCX dzs, moderate ostial D2/3 dzs. RCA 60-70p, 99d (3.5x12 Xience DES).   GERD (gastroesophageal reflux disease)    Hyperlipidemia    Hypertension    MI (myocardial infarction) (HCC)    10/2004, 01/2010    Past Surgical History:  Procedure Laterality Date   CARDIAC CATHETERIZATION     FRACTURE SURGERY Right    wrist  60yo   SHOULDER ARTHROSCOPY WITH OPEN ROTATOR CUFF REPAIR AND DISTAL CLAVICLE ACROMINECTOMY Left 10/23/2020   Procedure: LEFT SHOULDER ARTHROSCOPY, DEBRIDEMENT, MINI OPEN ROTATOR CUFF TEAR REPAIR, BICEPS TENODESIS;  Surgeon: Cammy Copa, MD;  Location: [facility] OR;  Service: Orthopedics;  Laterality: Left;   VASECTOMY  1984    There were no vitals filed for this visit.   Subjective Assessment - 12/06/20 0847     Subjective able to progress to AROM/strengthening of Lt shoulder    Pertinent History HTN, stented coronary artery, CAD, MI    Limitations Lifting    Patient Stated Goals return to work, reach hand above head    Currently in Pain? Yes     Pain Score 4     Pain Location Shoulder    Pain Orientation Left    Pain Descriptors / Indicators Aching;Dull;Sore;Discomfort    Pain Type Acute pain;Surgical pain    Pain Onset More than a month ago    Pain Frequency Intermittent    Aggravating Factors  movement    Pain Relieving Factors meds, rest                [facility] PT Assessment - 12/06/20 0852       Assessment   Medical Diagnosis Z98.890 (ICD-10-CM) - Status post rotator cuff repair    Referring Provider (PT) Cammy Copa, MD    Onset Date/Surgical Date 10/23/20      Precautions   Type of Shoulder Precautions okay to progress AROM/strengthening      ROM / Strength   AROM / PROM / Strength AROM      AROM   AROM Assessment Site Shoulder    Right/Left Shoulder Left    Left Shoulder Flexion 118 Degrees   73 sitting   Left Shoulder ABduction 69 Degrees   41 sitting   Left Shoulder Internal Rotation 83 Degrees    Left Shoulder External Rotation 43 Degrees  [facility] Adult PT Treatment/Exercise - 12/06/20 0848       Shoulder Exercises: Supine   Flexion AROM;Left;20 reps      Shoulder Exercises: Sidelying   External Rotation AROM;Left;20 reps    ABduction AROM;Left;20 reps      Shoulder Exercises: Standing   Flexion AAROM;20 reps   1# bar   ABduction AAROM;20 reps   1# bar; visual cues to decrease shrug   Row Both;20 reps;Theraband    Theraband Level (Shoulder Row) Level 4 (Blue)      Shoulder Exercises: Pulleys   Flexion 3 minutes    Scaption 3 minutes      Shoulder Exercises: ROM/Strengthening   UBE (Upper Arm Bike) L2 x 5 min (2.5' each direction)    Other ROM/Strengthening Exercises wall ladder flexion and scaption x 10 reps each; Lt                    PT Education - 12/06/20 0905     Education Details updated HEP    Person(s) Educated Patient    Methods Explanation;Demonstration;Handout    Comprehension Verbalized understanding;Returned  demonstration;Need further instruction              PT Short Term Goals - 12/04/20 0832       PT SHORT TERM GOAL #1   Title independent with HEP    Time 4    Period Weeks    Status Achieved    Target Date 12/17/20      PT SHORT TERM GOAL #2   Title Lt shoulder PROM improved by 15 deg flex, abduction, and er for improved function    Time 4    Period Weeks    Status Achieved    Target Date 12/17/20               PT Long Term Goals - 12/04/20 0832       PT LONG TERM GOAL #1   Title independent with final HEP    Time 8    Period Days    Status On-going    Target Date 01/14/21      PT LONG TERM GOAL #2   Title improve Lt shoulder AROM to [facility] for improved function and mobility    Time 8    Period Weeks    Status On-going      PT LONG TERM GOAL #3   Title demonstrate at least 4/5 Lt shoulder strength for improved function    Time 8    Period Weeks    Status On-going      PT LONG TERM GOAL #4   Title FOTO score improved to 66 for improved function    Time 8    Period Weeks    Status On-going      PT LONG TERM GOAL #5   Title report pain < 3/10 with activity for improved function    Time 8    Period Weeks    Status On-going                   Plan - 12/06/20 8032     Clinical Impression Statement Pt now able to progress to AROM and strengthening so session focused on updated HEP and initiating AROM exercises.  Expected increase in pain noted with exercises, which resolves with rest.  Will continue to benefit from PT to maximize function.    Personal Factors and Comorbidities Comorbidity 3+;Profession    Comorbidities HTN, stented coronary artery, CAD,  MI    Examination-Activity Limitations Carry;Lift;Reach Overhead    Examination-Participation Restrictions Cleaning;Community Activity;Driving;Yard Work;Occupation    Stability/Clinical Decision Making Stable/Uncomplicated    Rehab Potential Good    PT Frequency 2x / week    PT Duration 8  weeks    PT Treatment/Interventions ADLs/Self Care Home Management;Aquatic Therapy;Cryotherapy;Electrical Stimulation;Moist Heat;Therapeutic exercise;Therapeutic activities;Functional mobility training;Ultrasound;Neuromuscular re-education;Patient/family education;Manual techniques;Vasopneumatic Device;Taping;Dry needling;Passive range of motion;Scar mobilization    PT Next Visit Plan review updated HEP, continue with AROM and light strengthening as tolerate    PT Home Exercise Plan Access Code: [email or web address]    Consulted and Agree with Plan of Care Patient             Patient will benefit from skilled therapeutic intervention in order to improve the following deficits and impairments:  Pain, Impaired UE functional use, Decreased strength, Decreased range of motion, Impaired flexibility, Postural dysfunction, Increased edema  Visit Diagnosis: Acute pain of left shoulder  Stiffness of left shoulder, not elsewhere classified  Muscle weakness (generalized)  Abnormal posture  Localized edema     Problem List Patient Active Problem List   Diagnosis Date Noted   Biceps tendonitis, left    Traumatic complete tear of left rotator cuff    Atherosclerosis of native coronary artery of native heart with stable angina pectoris (HCC) 01/05/2017   Essential hypertension 01/05/2017   Stented coronary artery 08/08/2011   Smoking hx 08/08/2011   Hyperlipidemia 01/30/2010   Coronary atherosclerosis 01/30/2010      Clarita Crane, PT, DPT 12/06/20 9:23 AM       [facility] Physical Therapy [address] [place], [place], [address] Phone: [phone]   Fax:  [phone]  Name: Ison Wichmann MRN: [id number] Date of Birth: 1961-01-02

## 2020-12-06 NOTE — Patient Instructions (Signed)
Access Code: [email or web address] URL: [email or web address] Date: 12/06/2020 Prepared by: Moshe Cipro  Exercises Seated Shoulder Flexion Towel Slide at Table Top - 2-3 x daily - 7 x weekly - 2 sets - 10 reps Seated Shoulder Scaption Slide at Table Top with Forearm in Neutral - 2-3 x daily - 7 x weekly - 2 sets - 10 reps Seated Shoulder External Rotation PROM on Table - 2-3 x daily - 7 x weekly - 2 sets - 10 reps Supine Shoulder Flexion Extension Full Range AROM - 2-3 x daily - 7 x weekly - 2-3 sets - 10 reps Sidelying Shoulder Abduction Palm Forward - 2-3 x daily - 7 x weekly - 2-3 sets - 10 reps Sidelying Shoulder External Rotation - 2-3 x daily - 7 x weekly - 2-3 sets - 10 reps Standing Row with Anchored Resistance - 2-3 x daily - 7 x weekly - 2-3 sets - 10 reps

## 2020-12-09 ENCOUNTER — Encounter: Payer: Self-pay | Admitting: Orthopedic Surgery

## 2020-12-09 NOTE — Progress Notes (Signed)
Post-Op Visit Note   Patient: Erik Perkins           Date of Birth: 03/26/1961           MRN: [id number] Visit Date: 12/05/2020 PCP: [facility]   Assessment & Plan:  Chief Complaint:  Chief Complaint  Patient presents with   Shoulder Pain   Visit Diagnoses:  1. Status post rotator cuff repair     Plan: Patient is a 60 year old male who presents s/p left shoulder arthroscopy with mini open rotator cuff tear repair on 10/23/2020.  He is 6 weeks and 1 day out from procedure.  He is going to physical therapy but has not started on strengthening exercises yet.  He reports that he is doing okay but he is more sore with increased activity.  He works as an Retail banker.  Still has difficulty sleeping at night but this is slowly improving.  On exam he has well-healed incisions with no evidence of infection or dehiscence.  25 degrees external rotation, 60 degrees abduction, 95 degrees forward flexion.  Active motion equivalent to passive motion.  He has excellent rotator cuff strength of supra, infra, subscap and only very mild crepitus noted with passive motion of the left shoulder.  Given his very physical work, plan to keep patient out of work for at least 6 more weeks.  He will follow-up in 6 weeks for clinical recheck with Dr. August Saucer and discussion of return to work plan at that time.  Strength seems excellent given where he is in recovery right now and anticipate his function will improve with the shoulder as his passive motion improves.  Follow-Up Instructions: No follow-ups on file.   Orders:  No orders of the defined types were placed in this encounter.  Meds ordered this encounter  Medications   meloxicam (MOBIC) 15 MG tablet    Sig: Take 1 tablet (15 mg total) by mouth daily as needed for pain.    Dispense:  30 tablet    Refill:  0    Imaging: No results found.  PMFS History: Patient Active Problem List   Diagnosis Date Noted   Biceps tendonitis, left     Traumatic complete tear of left rotator cuff    Atherosclerosis of native coronary artery of native heart with stable angina pectoris (HCC) 01/05/2017   Essential hypertension 01/05/2017   Stented coronary artery 08/08/2011   Smoking hx 08/08/2011   Hyperlipidemia 01/30/2010   Coronary atherosclerosis 01/30/2010   Past Medical History:  Diagnosis Date   Coronary artery disease    a. 01/2010 NSTEMI/PCI: Mild diff LDA/LCX dzs, moderate ostial D2/3 dzs. RCA 60-70p, 99d (3.5x12 Xience DES).   GERD (gastroesophageal reflux disease)    Hyperlipidemia    Hypertension    MI (myocardial infarction) (HCC)    10/2004, 01/2010    Family History  Problem Relation Age of Onset   Coronary artery disease Sister        stents placed   Diabetes Sister    Hyperlipidemia Sister    Cancer Father        lung   Hyperlipidemia Father     Past Surgical History:  Procedure Laterality Date   CARDIAC CATHETERIZATION     FRACTURE SURGERY Right    wrist  60yo   SHOULDER ARTHROSCOPY WITH OPEN ROTATOR CUFF REPAIR AND DISTAL CLAVICLE ACROMINECTOMY Left 10/23/2020   Procedure: LEFT SHOULDER ARTHROSCOPY, DEBRIDEMENT, MINI OPEN ROTATOR CUFF TEAR REPAIR, BICEPS TENODESIS;  Surgeon: Rise Paganini  Lorin Picket, MD;  Location: [facility] OR;  Service: Orthopedics;  Laterality: Left;   VASECTOMY  1984   Social History   Occupational History   Not on file  Tobacco Use   Smoking status: Former    Packs/day: 1.00    Years: 25.00    Pack years: 25.00    Types: Cigarettes    Quit date: 01/01/2010    Years since quitting: 10.9   Smokeless tobacco: Never  Vaping Use   Vaping Use: Never used  Substance and Sexual Activity   Alcohol use: Yes    Alcohol/week: 4.0 standard drinks    Types: 2 Cans of beer, 2 Standard drinks or equivalent per week    Comment: about every two months   Drug use: No   Sexual activity: Yes    Birth control/protection: Surgical

## 2020-12-11 ENCOUNTER — Encounter: Payer: Self-pay | Admitting: Physical Therapy

## 2020-12-11 ENCOUNTER — Ambulatory Visit (INDEPENDENT_AMBULATORY_CARE_PROVIDER_SITE_OTHER): Payer: No Typology Code available for payment source | Admitting: Physical Therapy

## 2020-12-11 ENCOUNTER — Other Ambulatory Visit: Payer: Self-pay

## 2020-12-11 DIAGNOSIS — M25512 Pain in left shoulder: Secondary | ICD-10-CM

## 2020-12-11 DIAGNOSIS — R293 Abnormal posture: Secondary | ICD-10-CM

## 2020-12-11 DIAGNOSIS — R600 Localized edema: Secondary | ICD-10-CM

## 2020-12-11 DIAGNOSIS — M6281 Muscle weakness (generalized): Secondary | ICD-10-CM

## 2020-12-11 DIAGNOSIS — M25612 Stiffness of left shoulder, not elsewhere classified: Secondary | ICD-10-CM | POA: Diagnosis not present

## 2020-12-11 NOTE — Therapy (Signed)
[facility] Therapy [address] [place], [place], [address] Phone: [phone]   Fax:  [phone]  Physical Therapy Treatment  Patient Details  Name: Erik Perkins MRN: [id number] Date of Birth: [birth year] Referring Provider (PT): Cammy Copa, MD   Encounter Date: 12/11/2020   PT End of Session - 12/11/20 0925     Visit Number 7    Number of Visits 16    Date for PT Re-Evaluation 01/14/21    Authorization Type [place]    Authorization Time Period 06/18/20-12/15/20    Authorization - Visit Number 7    Authorization - Number of Visits 15    Progress Note Due on Visit 10    PT Start Time 0847    PT Stop Time 0925    PT Time Calculation (min) 38 min    Activity Tolerance Patient tolerated treatment well    Behavior During Therapy [facility] for tasks assessed/performed             Past Medical History:  Diagnosis Date   Coronary artery disease    a. 01/2010 NSTEMI/PCI: Mild diff LDA/LCX dzs, moderate ostial D2/3 dzs. RCA 60-70p, 99d (3.5x12 Xience DES).   GERD (gastroesophageal reflux disease)    Hyperlipidemia    Hypertension    MI (myocardial infarction) (HCC)    10/2004, 01/2010    Past Surgical History:  Procedure Laterality Date   CARDIAC CATHETERIZATION     FRACTURE SURGERY Right    wrist  60yo   SHOULDER ARTHROSCOPY WITH OPEN ROTATOR CUFF REPAIR AND DISTAL CLAVICLE ACROMINECTOMY Left 10/23/2020   Procedure: LEFT SHOULDER ARTHROSCOPY, DEBRIDEMENT, MINI OPEN ROTATOR CUFF TEAR REPAIR, BICEPS TENODESIS;  Surgeon: Cammy Copa, MD;  Location: [facility] OR;  Service: Orthopedics;  Laterality: Left;   VASECTOMY  1984    There were no vitals filed for this visit.   Subjective Assessment - 12/11/20 0851     Subjective shoulder is doing "okay"    Pertinent History HTN, stented coronary artery, CAD, MI    Limitations Lifting    Patient Stated Goals return to work, reach hand above head    Currently in Pain? Yes    Pain Score 4     Pain  Location Shoulder    Pain Orientation Left    Pain Descriptors / Indicators Aching;Sore;Dull;Discomfort    Pain Type Acute pain;Surgical pain    Pain Onset More than a month ago    Pain Frequency Intermittent    Aggravating Factors  movement    Pain Relieving Factors meds, rest                               [facility] Adult PT Treatment/Exercise - 12/11/20 0845       Shoulder Exercises: Standing   Flexion AAROM   1# bar; 25 reps   ABduction AAROM   1# bar; 25 reps visual cues to decrease shrug   Row Both;20 reps;Theraband    Theraband Level (Shoulder Row) Level 4 (Blue)    Other Standing Exercises IR/ER walkout with L2 band x 20 reps each; Lt      Shoulder Exercises: Pulleys   Flexion 3 minutes    Scaption 3 minutes      Shoulder Exercises: ROM/Strengthening   UBE (Upper Arm Bike) L2 x 8 min (alt 2' fwd/bwd)    Other ROM/Strengthening Exercises wall ladder flexion and scaption x 10 reps each; Lt  PT Short Term Goals - 12/04/20 0832       PT SHORT TERM GOAL #1   Title independent with HEP    Time 4    Period Weeks    Status Achieved    Target Date 12/17/20      PT SHORT TERM GOAL #2   Title Lt shoulder PROM improved by 15 deg flex, abduction, and er for improved function    Time 4    Period Weeks    Status Achieved    Target Date 12/17/20               PT Long Term Goals - 12/04/20 0832       PT LONG TERM GOAL #1   Title independent with final HEP    Time 8    Period Days    Status On-going    Target Date 01/14/21      PT LONG TERM GOAL #2   Title improve Lt shoulder AROM to [facility] for improved function and mobility    Time 8    Period Weeks    Status On-going      PT LONG TERM GOAL #3   Title demonstrate at least 4/5 Lt shoulder strength for improved function    Time 8    Period Weeks    Status On-going      PT LONG TERM GOAL #4   Title FOTO score improved to 66 for improved function    Time 8     Period Weeks    Status On-going      PT LONG TERM GOAL #5   Title report pain < 3/10 with activity for improved function    Time 8    Period Weeks    Status On-going                   Plan - 12/11/20 2130     Clinical Impression Statement Continue to progress ROM and strengthening activites as tolerated.  Tolerated session well today and declined modalities at the end. Will continue to benefit from PT to maximize function.    Personal Factors and Comorbidities Comorbidity 3+;Profession    Comorbidities HTN, stented coronary artery, CAD, MI    Examination-Activity Limitations Carry;Lift;Reach Overhead    Examination-Participation Restrictions Cleaning;Community Activity;Driving;Yard Work;Occupation    Stability/Clinical Decision Making Stable/Uncomplicated    Rehab Potential Good    PT Frequency 2x / week    PT Duration 8 weeks    PT Treatment/Interventions ADLs/Self Care Home Management;Aquatic Therapy;Cryotherapy;Electrical Stimulation;Moist Heat;Therapeutic exercise;Therapeutic activities;Functional mobility training;Ultrasound;Neuromuscular re-education;Patient/family education;Manual techniques;Vasopneumatic Device;Taping;Dry needling;Passive range of motion;Scar mobilization    PT Next Visit Plan review updated HEP, continue with AROM and light strengthening as tolerate    PT Home Exercise Plan Access Code: [email or web address]    Consulted and Agree with Plan of Care Patient             Patient will benefit from skilled therapeutic intervention in order to improve the following deficits and impairments:  Pain, Impaired UE functional use, Decreased strength, Decreased range of motion, Impaired flexibility, Postural dysfunction, Increased edema  Visit Diagnosis: Acute pain of left shoulder  Stiffness of left shoulder, not elsewhere classified  Muscle weakness (generalized)  Abnormal posture  Localized edema     Problem List Patient Active Problem List    Diagnosis Date Noted   Biceps tendonitis, left    Traumatic complete tear of left rotator cuff    Atherosclerosis of native coronary artery of  native heart with stable angina pectoris (HCC) 01/05/2017   Essential hypertension 01/05/2017   Stented coronary artery 08/08/2011   Smoking hx 08/08/2011   Hyperlipidemia 01/30/2010   Coronary atherosclerosis 01/30/2010      Clarita Crane, PT, DPT 12/11/20 9:27 AM      [facility] Physical Therapy [address] [place], [place], [address] Phone: [phone]   Fax:  [phone]  Name: Erik Perkins MRN: [id number] Date of Birth: 1961-03-22

## 2020-12-13 ENCOUNTER — Encounter: Payer: Self-pay | Admitting: Rehabilitative and Restorative Service Providers"

## 2020-12-13 ENCOUNTER — Ambulatory Visit (INDEPENDENT_AMBULATORY_CARE_PROVIDER_SITE_OTHER): Payer: No Typology Code available for payment source | Admitting: Rehabilitative and Restorative Service Providers"

## 2020-12-13 ENCOUNTER — Other Ambulatory Visit: Payer: Self-pay

## 2020-12-13 DIAGNOSIS — M25512 Pain in left shoulder: Secondary | ICD-10-CM

## 2020-12-13 DIAGNOSIS — M25612 Stiffness of left shoulder, not elsewhere classified: Secondary | ICD-10-CM | POA: Diagnosis not present

## 2020-12-13 DIAGNOSIS — M6281 Muscle weakness (generalized): Secondary | ICD-10-CM

## 2020-12-13 DIAGNOSIS — R600 Localized edema: Secondary | ICD-10-CM

## 2020-12-13 DIAGNOSIS — G8929 Other chronic pain: Secondary | ICD-10-CM

## 2020-12-13 NOTE — Therapy (Signed)
[facility] Physical Therapy [address] [place], [place], [address] Phone: [phone]   Fax:  [phone]  Physical Therapy Treatment  Patient Details  Name: Erik Perkins MRN: [id number] Date of Birth: June 06, 1961 Referring Provider (PT): Cammy Copa, MD   Encounter Date: 12/13/2020   PT End of Session - 12/13/20 1036     Visit Number 8    Number of Visits 16    Date for PT Re-Evaluation 01/14/21    Authorization Type [facility]    Authorization Time Period 06/18/20-12/15/20    Authorization - Visit Number 8    Authorization - Number of Visits 15    Progress Note Due on Visit 10    PT Start Time 0939    PT Stop Time 1035    PT Time Calculation (min) 56 min    Activity Tolerance Patient tolerated treatment well;No increased pain    Behavior During Therapy [facility] for tasks assessed/performed             Past Medical History:  Diagnosis Date   Coronary artery disease    a. 01/2010 NSTEMI/PCI: Mild diff LDA/LCX dzs, moderate ostial D2/3 dzs. RCA 60-70p, 99d (3.5x12 Xience DES).   GERD (gastroesophageal reflux disease)    Hyperlipidemia    Hypertension    MI (myocardial infarction) (HCC)    10/2004, 01/2010    Past Surgical History:  Procedure Laterality Date   CARDIAC CATHETERIZATION     FRACTURE SURGERY Right    wrist  60yo   SHOULDER ARTHROSCOPY WITH OPEN ROTATOR CUFF REPAIR AND DISTAL CLAVICLE ACROMINECTOMY Left 10/23/2020   Procedure: LEFT SHOULDER ARTHROSCOPY, DEBRIDEMENT, MINI OPEN ROTATOR CUFF TEAR REPAIR, BICEPS TENODESIS;  Surgeon: Cammy Copa, MD;  Location: [facility] OR;  Service: Orthopedics;  Laterality: Left;   VASECTOMY  1984    There were no vitals filed for this visit.   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    Pertinent History HTN, stented coronary artery, CAD, MI    Limitations Lifting    Patient Stated Goals return to work, reach hand above  head    Currently in Pain? 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    Multiple Pain Sites No                [facility] PT Assessment - 12/13/20 0001       ROM / Strength   AROM / PROM / Strength AROM      AROM   AROM Assessment Site Shoulder    Right/Left Shoulder Left    Left Shoulder Flexion 110 Degrees    Left Shoulder Internal Rotation 55 Degrees   supine and avoiding anterior shoulder rotation   Left Shoulder External Rotation 60 Degrees    Left Shoulder Horizontal ADduction 40 Degrees                           [facility] Adult PT  Treatment/Exercise - 12/13/20 0001       Exercises   Exercises Shoulder      Shoulder Exercises: Supine   Protraction Strengthening;Left;20 reps;Weights    Protraction Weight (lbs) 4#    External Rotation AAROM;Left;20 reps;Limitations    External Rotation Limitations 10 seconds    Internal Rotation AAROM;Left;20 reps;Limitations    Internal Rotation Limitations 10 seconds    Flexion AAROM;Left;20 reps;Limitations    Flexion Limitations 10 seconds      Shoulder Exercises: Seated   Retraction Strengthening;Both;20 reps;Limitations    Retraction Limitations 5 seconds shoulder blade pinches                    PT Education - 12/13/20 1035     Education Details Added scapular strengthening and capsular stretching to HEP.    Person(s) Educated Patient    Methods Explanation;Demonstration;Tactile cues;Verbal cues;Handout    Comprehension Verbalized understanding;Tactile cues required;Need further instruction;Returned demonstration;Verbal cues required               PT Short Term Goals - 12/13/20 1036       PT SHORT TERM GOAL #1   Title independent with HEP    Time 4    Period Weeks    Status Achieved    Target Date 12/17/20      PT SHORT TERM GOAL #2   Title Lt shoulder PROM improved by 15 deg flex, abduction, and er for improved function    Time 4    Period Weeks    Status Achieved    Target Date 12/17/20               PT Long Term Goals - 12/13/20 1036       PT LONG TERM GOAL #1   Title independent with final HEP    Time 8    Period Days    Status On-going      PT LONG TERM GOAL #2   Title improve Lt shoulder AROM to [facility] for improved function and mobility    Time 8    Period Weeks    Status On-going      PT LONG TERM GOAL #3   Title demonstrate at least 4/5 Lt shoulder strength for improved function    Time 8    Period Weeks    Status On-going      PT LONG TERM GOAL #4   Title FOTO score improved to 66 for improved function    Time 8    Period Weeks    Status On-going      PT LONG TERM GOAL #5   Title report pain < 3/10 with activity for improved function    Time 8    Period Weeks    Status On-going                   Plan - 12/13/20 1037     Clinical Impression Statement Today's emphasis was on capsular flexibility and scapular strength as Erik Perkins is a bit limited in both areas.  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  We are waiting on [place] authorization to continue supervised PT to progress AROM, capsular flexibility, scapular and RTC strength.    Personal Factors and Comorbidities Comorbidity 3+;Profession    Comorbidities  HTN, stented coronary artery, CAD, MI    Examination-Activity Limitations Carry;Lift;Reach Overhead    Examination-Participation Restrictions Cleaning;Community  Activity;Driving;Yard Work;Occupation    Stability/Clinical Decision Making Stable/Uncomplicated    Rehab Potential Good    PT Frequency 2x / week    PT Duration 8 weeks    PT Treatment/Interventions ADLs/Self Care Home Management;Aquatic Therapy;Cryotherapy;Electrical Stimulation;Moist Heat;Therapeutic exercise;Therapeutic activities;Functional mobility training;Ultrasound;Neuromuscular re-education;Patient/family education;Manual techniques;Vasopneumatic Device;Taping;Dry needling;Passive range of motion;Scar mobilization    PT Next Visit Plan Capsular stretching.  Progress scapular and RTC strength as appropriate.    PT Home Exercise Plan Access Code: [email or web address]    Consulted and Agree with Plan of Care Patient             Patient will benefit from skilled therapeutic intervention in order to improve the following deficits and impairments:  Pain, Impaired UE functional use, Decreased strength, Decreased range of motion, Impaired flexibility, Postural dysfunction, Increased edema  Visit Diagnosis: Stiffness of left shoulder, not elsewhere classified  Chronic pain in left shoulder  Muscle weakness (generalized)  Localized edema     Problem List Patient Active Problem List   Diagnosis Date Noted   Biceps tendonitis, left    Traumatic complete tear of left rotator cuff    Atherosclerosis of native coronary artery of native heart with stable angina pectoris (HCC) 01/05/2017   Essential hypertension 01/05/2017   Stented coronary artery 08/08/2011   Smoking hx 08/08/2011   Hyperlipidemia 01/30/2010   Coronary atherosclerosis 01/30/2010    Cherlyn Cushing PT, MPT 12/13/2020, 10:41 AM  [facility] Physical Therapy [address] [place], [place], [address] Phone: [phone]   Fax:  [phone]  Name: Erik Perkins MRN: [id number] Date of Birth: 1960/07/24

## 2020-12-13 NOTE — Patient Instructions (Signed)
Access Code: [email or web address] URL: [email or web address] Date: 12/13/2020 Prepared by: Pauletta Browns  Exercises Seated Shoulder Flexion Towel Slide at Table Top - 2-3 x daily - 7 x weekly - 2 sets - 10 reps Seated Shoulder Scaption Slide at Table Top with Forearm in Neutral - 2-3 x daily - 7 x weekly - 2 sets - 10 reps Seated Shoulder External Rotation PROM on Table - 2-3 x daily - 7 x weekly - 2 sets - 10 reps Supine Shoulder Flexion Extension Full Range AROM - 2-3 x daily - 7 x weekly - 2-3 sets - 10 reps Sidelying Shoulder Abduction Palm Forward - 2-3 x daily - 7 x weekly - 2-3 sets - 10 reps Sidelying Shoulder External Rotation - 2-3 x daily - 7 x weekly - 2-3 sets - 10 reps Standing Row with Anchored Resistance - 2-3 x daily - 7 x weekly - 2-3 sets - 10 reps Standing Scapular Retraction - 5 x daily - 7 x weekly - 1 sets - 5 reps - 5 second hold Supine Scapular Protraction in Flexion with Dumbbells - 2 x daily - 7 x weekly - 1 sets - 20 reps - 3 seconds hold Supine Shoulder Internal Rotation Stretch - 2 x daily - 7 x weekly - 1 sets - 10-20 reps - 10 seconds hold Supine Shoulder External Rotation Stretch - 2 x daily - 7 x weekly - 1 sets - 10-20 reps - 10 seconds hold Supine Shoulder Flexion AAROM with Hands Clasped - 2 x daily - 7 x weekly - 1 sets - 10-20 reps - 10 seconds hold

## 2020-12-17 ENCOUNTER — Encounter: Payer: No Typology Code available for payment source | Admitting: Physical Therapy

## 2020-12-18 ENCOUNTER — Encounter: Payer: No Typology Code available for payment source | Admitting: Physical Therapy

## 2020-12-19 ENCOUNTER — Ambulatory Visit (INDEPENDENT_AMBULATORY_CARE_PROVIDER_SITE_OTHER): Payer: No Typology Code available for payment source | Admitting: Rehabilitative and Restorative Service Providers"

## 2020-12-19 ENCOUNTER — Other Ambulatory Visit: Payer: Self-pay

## 2020-12-19 ENCOUNTER — Encounter: Payer: Self-pay | Admitting: Rehabilitative and Restorative Service Providers"

## 2020-12-19 DIAGNOSIS — R293 Abnormal posture: Secondary | ICD-10-CM

## 2020-12-19 DIAGNOSIS — M25612 Stiffness of left shoulder, not elsewhere classified: Secondary | ICD-10-CM | POA: Diagnosis not present

## 2020-12-19 DIAGNOSIS — R600 Localized edema: Secondary | ICD-10-CM

## 2020-12-19 DIAGNOSIS — M6281 Muscle weakness (generalized): Secondary | ICD-10-CM | POA: Diagnosis not present

## 2020-12-19 DIAGNOSIS — M25512 Pain in left shoulder: Secondary | ICD-10-CM | POA: Diagnosis not present

## 2020-12-19 NOTE — Therapy (Signed)
[facility] Physical Therapy [address] [place], [place], [address] Phone: [phone]   Fax:  [phone]  Physical Therapy Treatment  Patient Details  Name: Erik Perkins MRN: [id number] Date of Birth: [birth year] Referring Provider (PT): Cammy Copa, MD   Encounter Date: 12/19/2020   PT End of Session - 12/19/20 1458     Visit Number 9    Number of Visits 15   15 VA visits approved   Date for PT Re-Evaluation 01/14/21    Authorization Type VA    Authorization Time Period 06/18/20- 02/25/2021    Authorization - Visit Number 9    Authorization - Number of Visits 15    Progress Note Due on Visit 10    PT Start Time 1510    PT Stop Time 1550    PT Time Calculation (min) 40 min    Activity Tolerance Patient tolerated treatment well    Behavior During Therapy [facility] for tasks assessed/performed             Past Medical History:  Diagnosis Date   Coronary artery disease    a. 01/2010 NSTEMI/PCI: Mild diff LDA/LCX dzs, moderate ostial D2/3 dzs. RCA 60-70p, 99d (3.5x12 Xience DES).   GERD (gastroesophageal reflux disease)    Hyperlipidemia    Hypertension    MI (myocardial infarction) (HCC)    10/2004, 01/2010    Past Surgical History:  Procedure Laterality Date   CARDIAC CATHETERIZATION     FRACTURE SURGERY Right    wrist  60yo   SHOULDER ARTHROSCOPY WITH OPEN ROTATOR CUFF REPAIR AND DISTAL CLAVICLE ACROMINECTOMY Left 10/23/2020   Procedure: LEFT SHOULDER ARTHROSCOPY, DEBRIDEMENT, MINI OPEN ROTATOR CUFF TEAR REPAIR, BICEPS TENODESIS;  Surgeon: Cammy Copa, MD;  Location: [facility] OR;  Service: Orthopedics;  Laterality: Left;   VASECTOMY  1984    There were no vitals filed for this visit.   Subjective Assessment - 12/19/20 1514     Subjective Pt. reported feeling pressure in shoulder "like a weight" as well as pin point upper lateral arm constant symptoms and arm feeling "worn out" consistently at this time.    Pertinent History HTN,  stented coronary artery, CAD, MI    Limitations Lifting    Patient Stated Goals return to work, reach hand above head    Currently in Pain? Yes    Pain Score 5     Pain Location Shoulder    Pain Orientation Left    Pain Descriptors / Indicators Sore;Pressure;Aching    Pain Type Chronic pain;Surgical pain    Pain Onset More than a month ago    Pain Frequency Constant    Aggravating Factors  constant unchanging    Pain Relieving Factors nothing reported                               [facility] Adult PT Treatment/Exercise - 12/19/20 0001       Shoulder Exercises: Standing   Flexion AAROM;Both   25x 1 lb bar 0-90 degrees   ABduction AAROM   1 lb bar x 25 0-90 degrees   Row Both   5 sec retraction hold x 15   Theraband Level (Shoulder Row) Level 4 (Blue)    Other Standing Exercises IR, ER walkout isometric holds green band 5 sec x 10 each c Lt arm at side      Shoulder Exercises: Pulleys   Flexion 2 minutes    Scaption 2  minutes      Shoulder Exercises: ROM/Strengthening   UBE (Upper Arm Bike) Lvl 2.5 8 mins (2 mins fwd/back alternating)      Manual Therapy   Passive ROM compression c movement to Lt infraspinatus                      PT Short Term Goals - 12/13/20 1036       PT SHORT TERM GOAL #1   Title independent with HEP    Time 4    Period Weeks    Status Achieved    Target Date 12/17/20      PT SHORT TERM GOAL #2   Title Lt shoulder PROM improved by 15 deg flex, abduction, and er for improved function    Time 4    Period Weeks    Status Achieved    Target Date 12/17/20               PT Long Term Goals - 12/13/20 1036       PT LONG TERM GOAL #1   Title independent with final HEP    Time 8    Period Days    Status On-going      PT LONG TERM GOAL #2   Title improve Lt shoulder AROM to [facility] for improved function and mobility    Time 8    Period Weeks    Status On-going      PT LONG TERM GOAL #3   Title demonstrate at  least 4/5 Lt shoulder strength for improved function    Time 8    Period Weeks    Status On-going      PT LONG TERM GOAL #4   Title FOTO score improved to 66 for improved function    Time 8    Period Weeks    Status On-going      PT LONG TERM GOAL #5   Title report pain < 3/10 with activity for improved function    Time 8    Period Weeks    Status On-going                   Plan - 12/19/20 1528     Clinical Impression Statement Direct reproduction of lateral upper arm symptoms from trigger point pressure in Lt infrsapinatus.  Immediate reduction of symptoms noted after manual intervention.  In additional to continue manual intervention use for trigger point release and capsular stretching, Pt. to benefit from strengthening progression.    Personal Factors and Comorbidities Comorbidity 3+;Profession    Comorbidities HTN, stented coronary artery, CAD, MI    Examination-Activity Limitations Carry;Lift;Reach Overhead    Examination-Participation Restrictions Cleaning;Community Activity;Driving;Yard Work;Occupation    Stability/Clinical Decision Making Stable/Uncomplicated    Rehab Potential Good    PT Frequency 2x / week    PT Duration 8 weeks    PT Treatment/Interventions ADLs/Self Care Home Management;Aquatic Therapy;Cryotherapy;Electrical Stimulation;Moist Heat;Therapeutic exercise;Therapeutic activities;Functional mobility training;Ultrasound;Neuromuscular re-education;Patient/family education;Manual techniques;Vasopneumatic Device;Taping;Dry needling;Passive range of motion;Scar mobilization    PT Next Visit Plan Manual intervention for trigger point release prn.  Progress functional lifting strength program.    PT Home Exercise Plan Access Code: [email or web address]    Consulted and Agree with Plan of Care Patient             Patient will benefit from skilled therapeutic intervention in order to improve the following deficits and impairments:  Pain, Impaired UE functional use,  Decreased strength, Decreased range of  motion, Impaired flexibility, Postural dysfunction, Increased edema  Visit Diagnosis: Acute pain of left shoulder  Stiffness of left shoulder, not elsewhere classified  Abnormal posture  Muscle weakness (generalized)  Localized edema     Problem List Patient Active Problem List   Diagnosis Date Noted   Biceps tendonitis, left    Traumatic complete tear of left rotator cuff    Atherosclerosis of native coronary artery of native heart with stable angina pectoris (HCC) 01/05/2017   Essential hypertension 01/05/2017   Stented coronary artery 08/08/2011   Smoking hx 08/08/2011   Hyperlipidemia 01/30/2010   Coronary atherosclerosis 01/30/2010    Chyrel Masson, PT, DPT, OCS, ATC 12/19/20  4:39 PM    [facility] Physical Therapy [address] [place], [place], [address] Phone: [phone]   Fax:  [phone]  Name: Erik Perkins MRN: [id number] Date of Birth: 11-Aug-1960

## 2020-12-20 ENCOUNTER — Ambulatory Visit (INDEPENDENT_AMBULATORY_CARE_PROVIDER_SITE_OTHER): Payer: No Typology Code available for payment source | Admitting: Physical Therapy

## 2020-12-20 ENCOUNTER — Encounter: Payer: Self-pay | Admitting: Physical Therapy

## 2020-12-20 ENCOUNTER — Encounter: Payer: No Typology Code available for payment source | Admitting: Physical Therapy

## 2020-12-20 DIAGNOSIS — M25612 Stiffness of left shoulder, not elsewhere classified: Secondary | ICD-10-CM

## 2020-12-20 DIAGNOSIS — R293 Abnormal posture: Secondary | ICD-10-CM | POA: Diagnosis not present

## 2020-12-20 DIAGNOSIS — R600 Localized edema: Secondary | ICD-10-CM

## 2020-12-20 DIAGNOSIS — M25512 Pain in left shoulder: Secondary | ICD-10-CM

## 2020-12-20 DIAGNOSIS — G8929 Other chronic pain: Secondary | ICD-10-CM

## 2020-12-20 DIAGNOSIS — M6281 Muscle weakness (generalized): Secondary | ICD-10-CM

## 2020-12-20 NOTE — Therapy (Signed)
[facility] Physical Therapy [address] [place], [place], [address] Phone: [phone]   Fax:  [phone]  Physical Therapy Treatment  Patient Details  Name: Erik Perkins MRN: [id number] Date of Birth: July 24, 1960 Referring Provider (PT): Cammy Copa, MD   Encounter Date: 12/20/2020   PT End of Session - 12/20/20 0923     Visit Number 10    Number of Visits 15   15 VA visits approved   Date for PT Re-Evaluation 01/14/21    Authorization Type VA    Authorization Time Period 06/18/20- 02/25/2021    Authorization - Visit Number 10    Authorization - Number of Visits 15    PT Start Time 0845    PT Stop Time 0925    PT Time Calculation (min) 40 min    Activity Tolerance Patient tolerated treatment well    Behavior During Therapy [facility] for tasks assessed/performed             Past Medical History:  Diagnosis Date   Coronary artery disease    a. 01/2010 NSTEMI/PCI: Mild diff LDA/LCX dzs, moderate ostial D2/3 dzs. RCA 60-70p, 99d (3.5x12 Xience DES).   GERD (gastroesophageal reflux disease)    Hyperlipidemia    Hypertension    MI (myocardial infarction) (HCC)    10/2004, 01/2010    Past Surgical History:  Procedure Laterality Date   CARDIAC CATHETERIZATION     FRACTURE SURGERY Right    wrist  60yo   SHOULDER ARTHROSCOPY WITH OPEN ROTATOR CUFF REPAIR AND DISTAL CLAVICLE ACROMINECTOMY Left 10/23/2020   Procedure: LEFT SHOULDER ARTHROSCOPY, DEBRIDEMENT, MINI OPEN ROTATOR CUFF TEAR REPAIR, BICEPS TENODESIS;  Surgeon: Cammy Copa, MD;  Location: [facility] OR;  Service: Orthopedics;  Laterality: Left;   VASECTOMY  1984    There were no vitals filed for this visit.   Subjective Assessment - 12/20/20 0846     Subjective shoulder is about the same, pain improved with manual therapy yesterday    Pertinent History HTN, stented coronary artery, CAD, MI    Limitations Lifting    Patient Stated Goals return to work, reach hand above head    Currently  in Pain? Yes    Pain Score 5     Pain Location Shoulder    Pain Orientation Left    Pain Descriptors / Indicators Pressure;Sore;Aching    Pain Onset More than a month ago                               [facility] Adult PT Treatment/Exercise - 12/20/20 0851       Shoulder Exercises: Standing   Flexion AAROM;Both   25x 3#   ABduction AAROM   3 lb bar x 25 0-90 degrees   ABduction Limitations cues for shrug - good awareness and correction    Row Both   5 sec retraction hold 3x10   Theraband Level (Shoulder Row) Level 4 (Blue)    Other Standing Exercises IR walkout isometric 10# on cable 5 sec x 20 each c Lt arm at side; then 5# for ER walk out after 7 reps at 10#    Other Standing Exercises wall ladder with 2# weight x 10 reps flexion/scaption      Shoulder Exercises: Pulleys   Flexion 3 minutes    Scaption 3 minutes      Shoulder Exercises: ROM/Strengthening   UBE (Upper Arm Bike) Lvl 2.5 8 mins (2 mins fwd/back alternating)  PT Short Term Goals - 12/13/20 1036       PT SHORT TERM GOAL #1   Title independent with HEP    Time 4    Period Weeks    Status Achieved    Target Date 12/17/20      PT SHORT TERM GOAL #2   Title Lt shoulder PROM improved by 15 deg flex, abduction, and er for improved function    Time 4    Period Weeks    Status Achieved    Target Date 12/17/20               PT Long Term Goals - 12/13/20 1036       PT LONG TERM GOAL #1   Title independent with final HEP    Time 8    Period Days    Status On-going      PT LONG TERM GOAL #2   Title improve Lt shoulder AROM to [facility] for improved function and mobility    Time 8    Period Weeks    Status On-going      PT LONG TERM GOAL #3   Title demonstrate at least 4/5 Lt shoulder strength for improved function    Time 8    Period Weeks    Status On-going      PT LONG TERM GOAL #4   Title FOTO score improved to 66 for improved function    Time 8     Period Weeks    Status On-going      PT LONG TERM GOAL #5   Title report pain < 3/10 with activity for improved function    Time 8    Period Weeks    Status On-going                   Plan - 12/20/20 4098     Clinical Impression Statement Pt tolerated session well today progressing light strengthening exercises. He has some shoulder shrug with standing flexion and abduction, but has good awareness and is able to correct with min cues.  Will continue to benefit from PT to maximize function.    Personal Factors and Comorbidities Comorbidity 3+;Profession    Comorbidities HTN, stented coronary artery, CAD, MI    Examination-Activity Limitations Carry;Lift;Reach Overhead    Examination-Participation Restrictions Cleaning;Community Activity;Driving;Yard Work;Occupation    Stability/Clinical Decision Making Stable/Uncomplicated    Rehab Potential Good    PT Frequency 2x / week    PT Duration 8 weeks    PT Treatment/Interventions ADLs/Self Care Home Management;Aquatic Therapy;Cryotherapy;Electrical Stimulation;Moist Heat;Therapeutic exercise;Therapeutic activities;Functional mobility training;Ultrasound;Neuromuscular re-education;Patient/family education;Manual techniques;Vasopneumatic Device;Taping;Dry needling;Passive range of motion;Scar mobilization    PT Next Visit Plan Manual intervention for trigger point release prn.  Progress functional lifting strength program.    PT Home Exercise Plan Access Code: [email or web address]    Consulted and Agree with Plan of Care Patient             Patient will benefit from skilled therapeutic intervention in order to improve the following deficits and impairments:  Pain, Impaired UE functional use, Decreased strength, Decreased range of motion, Impaired flexibility, Postural dysfunction, Increased edema  Visit Diagnosis: Acute pain of left shoulder  Stiffness of left shoulder, not elsewhere classified  Abnormal posture  Muscle weakness  (generalized)  Localized edema  Chronic pain in left shoulder     Problem List Patient Active Problem List   Diagnosis Date Noted   Biceps tendonitis, left    Traumatic complete  tear of left rotator cuff    Atherosclerosis of native coronary artery of native heart with stable angina pectoris (HCC) 01/05/2017   Essential hypertension 01/05/2017   Stented coronary artery 08/08/2011   Smoking hx 08/08/2011   Hyperlipidemia 01/30/2010   Coronary atherosclerosis 01/30/2010      Clarita Crane, PT, DPT 12/20/20 9:26 AM     [facility] Physical Therapy [address] [place], [place], [address] Phone: [phone]   Fax:  [phone]  Name: Erik Perkins MRN: [id number] Date of Birth: 1960/09/25

## 2020-12-24 ENCOUNTER — Other Ambulatory Visit: Payer: Self-pay | Admitting: Cardiovascular Disease

## 2020-12-25 ENCOUNTER — Other Ambulatory Visit: Payer: Self-pay

## 2020-12-25 ENCOUNTER — Ambulatory Visit (INDEPENDENT_AMBULATORY_CARE_PROVIDER_SITE_OTHER): Payer: No Typology Code available for payment source | Admitting: Physical Therapy

## 2020-12-25 ENCOUNTER — Encounter: Payer: Self-pay | Admitting: Physical Therapy

## 2020-12-25 DIAGNOSIS — R293 Abnormal posture: Secondary | ICD-10-CM | POA: Diagnosis not present

## 2020-12-25 DIAGNOSIS — M6281 Muscle weakness (generalized): Secondary | ICD-10-CM

## 2020-12-25 DIAGNOSIS — R600 Localized edema: Secondary | ICD-10-CM

## 2020-12-25 DIAGNOSIS — G8929 Other chronic pain: Secondary | ICD-10-CM

## 2020-12-25 DIAGNOSIS — M25512 Pain in left shoulder: Secondary | ICD-10-CM | POA: Diagnosis not present

## 2020-12-25 DIAGNOSIS — M25612 Stiffness of left shoulder, not elsewhere classified: Secondary | ICD-10-CM | POA: Diagnosis not present

## 2020-12-25 NOTE — Therapy (Signed)
[facility] Physical Therapy [address] [place], [place], [address] Phone: [phone]   Fax:  [phone]  Physical Therapy Treatment  Patient Details  Name: Erik Perkins MRN: [id number] Date of Birth: Jan 18, 1961 Referring Provider (PT): Cammy Copa, MD   Encounter Date: 12/25/2020   PT End of Session - 12/25/20 0921     Visit Number 11    Number of Visits 15   15 VA visits approved   Date for PT Re-Evaluation 01/14/21    Authorization Type VA    Authorization Time Period 06/18/20- 02/25/2021    Authorization - Visit Number 11    Authorization - Number of Visits 15    PT Start Time 0845    PT Stop Time 0924    PT Time Calculation (min) 39 min    Activity Tolerance Patient tolerated treatment well    Behavior During Therapy [facility] for tasks assessed/performed             Past Medical History:  Diagnosis Date   Coronary artery disease    a. 01/2010 NSTEMI/PCI: Mild diff LDA/LCX dzs, moderate ostial D2/3 dzs. RCA 60-70p, 99d (3.5x12 Xience DES).   GERD (gastroesophageal reflux disease)    Hyperlipidemia    Hypertension    MI (myocardial infarction) (HCC)    10/2004, 01/2010    Past Surgical History:  Procedure Laterality Date   CARDIAC CATHETERIZATION     FRACTURE SURGERY Right    wrist  60yo   SHOULDER ARTHROSCOPY WITH OPEN ROTATOR CUFF REPAIR AND DISTAL CLAVICLE ACROMINECTOMY Left 10/23/2020   Procedure: LEFT SHOULDER ARTHROSCOPY, DEBRIDEMENT, MINI OPEN ROTATOR CUFF TEAR REPAIR, BICEPS TENODESIS;  Surgeon: Cammy Copa, MD;  Location: [facility] OR;  Service: Orthopedics;  Laterality: Left;   VASECTOMY  1984    There were no vitals filed for this visit.   Subjective Assessment - 12/25/20 0845     Subjective shoulder is sore    Pertinent History HTN, stented coronary artery, CAD, MI    Limitations Lifting    Patient Stated Goals return to work, reach hand above head    Currently in Pain? No/denies                                [facility] Adult PT Treatment/Exercise - 12/25/20 0846       Shoulder Exercises: Supine   Flexion Left;20 reps;Limitations;AROM    Shoulder Flexion Weight (lbs) 2    Flexion Limitations 10 seconds      Shoulder Exercises: Sidelying   External Rotation AROM;Left;20 reps    External Rotation Weight (lbs) 2    ABduction Left;20 reps;Weights    ABduction Weight (lbs) 2      Shoulder Exercises: Standing   Other Standing Exercises IR walkout isometric 10# on cable 5 sec x 10 with Lt arm at side;5# for ER walk out x 10 reps    Other Standing Exercises wall ladder with 2# weight x 10 reps flexion/scaption      Shoulder Exercises: Pulleys   Flexion 3 minutes    Scaption 3 minutes      Shoulder Exercises: ROM/Strengthening   UBE (Upper Arm Bike) Lvl 3 8 mins (2 mins fwd/back alternating)                      PT Short Term Goals - 12/13/20 1036       PT SHORT TERM GOAL #1   Title  independent with HEP    Time 4    Period Weeks    Status Achieved    Target Date 12/17/20      PT SHORT TERM GOAL #2   Title Lt shoulder PROM improved by 15 deg flex, abduction, and er for improved function    Time 4    Period Weeks    Status Achieved    Target Date 12/17/20               PT Long Term Goals - 12/13/20 1036       PT LONG TERM GOAL #1   Title independent with final HEP    Time 8    Period Days    Status On-going      PT LONG TERM GOAL #2   Title improve Lt shoulder AROM to [facility] for improved function and mobility    Time 8    Period Weeks    Status On-going      PT LONG TERM GOAL #3   Title demonstrate at least 4/5 Lt shoulder strength for improved function    Time 8    Period Weeks    Status On-going      PT LONG TERM GOAL #4   Title FOTO score improved to 66 for improved function    Time 8    Period Weeks    Status On-going      PT LONG TERM GOAL #5   Title report pain < 3/10 with activity for improved function     Time 8    Period Weeks    Status On-going                   Plan - 12/25/20 8768     Clinical Impression Statement Pt tolerated session well with continued focus on ROM and strengthening.  Will continue to benefit from PT to maximize function.    Personal Factors and Comorbidities Comorbidity 3+;Profession    Comorbidities HTN, stented coronary artery, CAD, MI    Examination-Activity Limitations Carry;Lift;Reach Overhead    Examination-Participation Restrictions Cleaning;Community Activity;Driving;Yard Work;Occupation    Stability/Clinical Decision Making Stable/Uncomplicated    Rehab Potential Good    PT Frequency 2x / week    PT Duration 8 weeks    PT Treatment/Interventions ADLs/Self Care Home Management;Aquatic Therapy;Cryotherapy;Electrical Stimulation;Moist Heat;Therapeutic exercise;Therapeutic activities;Functional mobility training;Ultrasound;Neuromuscular re-education;Patient/family education;Manual techniques;Vasopneumatic Device;Taping;Dry needling;Passive range of motion;Scar mobilization    PT Next Visit Plan Manual intervention for trigger point release prn.  Progress functional lifting strength program. Standing strengthening - work on decreasing shrug    PT Home Exercise Plan Access Code: [email or web address]    Consulted and Agree with Plan of Care Patient             Patient will benefit from skilled therapeutic intervention in order to improve the following deficits and impairments:  Pain, Impaired UE functional use, Decreased strength, Decreased range of motion, Impaired flexibility, Postural dysfunction, Increased edema  Visit Diagnosis: Acute pain of left shoulder  Stiffness of left shoulder, not elsewhere classified  Abnormal posture  Muscle weakness (generalized)  Localized edema  Chronic pain in left shoulder     Problem List Patient Active Problem List   Diagnosis Date Noted   Biceps tendonitis, left    Traumatic complete tear of left  rotator cuff    Atherosclerosis of native coronary artery of native heart with stable angina pectoris (HCC) 01/05/2017   Essential hypertension 01/05/2017   Stented coronary artery 08/08/2011  Smoking hx 08/08/2011   Hyperlipidemia 01/30/2010   Coronary atherosclerosis 01/30/2010      Clarita Crane, PT, DPT 12/25/20 9:28 AM     [facility] Physical Therapy [address] [place], [place], [address] Phone: [phone]   Fax:  [phone]  Name: Erik Perkins MRN: [id number] Date of Birth: 09-Oct-1960

## 2020-12-27 ENCOUNTER — Ambulatory Visit (INDEPENDENT_AMBULATORY_CARE_PROVIDER_SITE_OTHER): Payer: No Typology Code available for payment source | Admitting: Rehabilitative and Restorative Service Providers"

## 2020-12-27 ENCOUNTER — Other Ambulatory Visit: Payer: Self-pay

## 2020-12-27 ENCOUNTER — Encounter: Payer: Self-pay | Admitting: Rehabilitative and Restorative Service Providers"

## 2020-12-27 DIAGNOSIS — M25612 Stiffness of left shoulder, not elsewhere classified: Secondary | ICD-10-CM | POA: Diagnosis not present

## 2020-12-27 DIAGNOSIS — M6281 Muscle weakness (generalized): Secondary | ICD-10-CM

## 2020-12-27 DIAGNOSIS — M25512 Pain in left shoulder: Secondary | ICD-10-CM

## 2020-12-27 DIAGNOSIS — R293 Abnormal posture: Secondary | ICD-10-CM

## 2020-12-27 NOTE — Patient Instructions (Signed)
Access Code: [email or web address] URL: [email or web address] Date: 12/27/2020 Prepared by: Pauletta Browns  Exercises Seated Shoulder Flexion Towel Slide at Table Top - 2-3 x daily - 7 x weekly - 2 sets - 10 reps Seated Shoulder Scaption Slide at Table Top with Forearm in Neutral - 2-3 x daily - 7 x weekly - 2 sets - 10 reps Seated Shoulder External Rotation PROM on Table - 2-3 x daily - 7 x weekly - 2 sets - 10 reps Supine Shoulder Flexion Extension Full Range AROM - 2-3 x daily - 7 x weekly - 2-3 sets - 10 reps Sidelying Shoulder Abduction Palm Forward - 2-3 x daily - 7 x weekly - 2-3 sets - 10 reps Sidelying Shoulder External Rotation - 2-3 x daily - 7 x weekly - 2-3 sets - 10 reps Standing Row with Anchored Resistance - 2-3 x daily - 7 x weekly - 2-3 sets - 10 reps Standing Scapular Retraction - 5 x daily - 7 x weekly - 1 sets - 5 reps - 5 second hold Supine Scapular Protraction in Flexion with Dumbbells - 2 x daily - 7 x weekly - 1 sets - 20 reps - 3 seconds hold Supine Shoulder Internal Rotation Stretch - 2 x daily - 7 x weekly - 1 sets - 10-20 reps - 10 seconds hold Supine Shoulder External Rotation Stretch - 2 x daily - 7 x weekly - 1 sets - 10-20 reps - 10 seconds hold Supine Shoulder Flexion AAROM with Hands Clasped - 2 x daily - 7 x weekly - 1 sets - 10-20 reps - 10 seconds hold

## 2020-12-27 NOTE — Therapy (Signed)
[facility] Physical Therapy [address] [place], [place], [address] Phone: [phone]   Fax:  [phone]  Physical Therapy Treatment  Patient Details  Name: Erik Perkins MRN: [id number] Date of Birth: March 05, 1961 Referring Provider (PT): Cammy Copa, MD   Encounter Date: 12/27/2020   PT End of Session - 12/27/20 1130     Visit Number 12    Number of Visits 15   15 VA visits approved   Date for PT Re-Evaluation 01/14/21    Authorization Type VA    Authorization Time Period 06/18/20- 02/25/2021    Authorization - Visit Number 12    Authorization - Number of Visits 15    PT Start Time 0845    PT Stop Time 0930    PT Time Calculation (min) 45 min    Activity Tolerance Patient tolerated treatment well;No increased pain    Behavior During Therapy [facility] for tasks assessed/performed             Past Medical History:  Diagnosis Date   Coronary artery disease    a. 01/2010 NSTEMI/PCI: Mild diff LDA/LCX dzs, moderate ostial D2/3 dzs. RCA 60-70p, 99d (3.5x12 Xience DES).   GERD (gastroesophageal reflux disease)    Hyperlipidemia    Hypertension    MI (myocardial infarction) (HCC)    10/2004, 01/2010    Past Surgical History:  Procedure Laterality Date   CARDIAC CATHETERIZATION     FRACTURE SURGERY Right    wrist  60yo   SHOULDER ARTHROSCOPY WITH OPEN ROTATOR CUFF REPAIR AND DISTAL CLAVICLE ACROMINECTOMY Left 10/23/2020   Procedure: LEFT SHOULDER ARTHROSCOPY, DEBRIDEMENT, MINI OPEN ROTATOR CUFF TEAR REPAIR, BICEPS TENODESIS;  Surgeon: Cammy Copa, MD;  Location: [facility] OR;  Service: Orthopedics;  Laterality: Left;   VASECTOMY  1984    There were no vitals filed for this visit.   Subjective Assessment - 12/27/20 0907     Subjective [name] reports a constant 4/10 L shoulder pain.  Sleep is not uninterrupted.    Pertinent History HTN, stented coronary artery, CAD, MI    Limitations Lifting    Patient Stated Goals Return to work, reach hand  above head    Currently in Pain? Yes    Pain Score 4     Pain Location Shoulder    Pain Orientation Left    Pain Descriptors / Indicators Tightness;Aching;Sore    Pain Type Chronic pain;Surgical pain    Pain Radiating Towards NA    Pain Onset More than a month ago    Pain Frequency Constant    Aggravating Factors  Overuse with yard work    Pain Relieving Factors Rest    Effect of Pain on Daily Activities Out of work as an [employer]    Multiple Pain Sites No                [facility] PT Assessment - 12/27/20 0001       ROM / Strength   AROM / PROM / Strength AROM      AROM   Overall AROM Comments Measured supine with IR/ER at 80 degrees abduction and elbow just above shoulder level    AROM Assessment Site Shoulder    Right/Left Shoulder Left    Left Shoulder Flexion 125 Degrees    Left Shoulder Internal Rotation 50 Degrees    Left Shoulder External Rotation 60 Degrees    Left Shoulder Horizontal ADduction 35 Degrees  [facility] Adult PT Treatment/Exercise - 12/27/20 0001       Exercises   Exercises Shoulder      Shoulder Exercises: Supine   Protraction Strengthening;Left;20 reps;Weights    Protraction Weight (lbs) 5#    External Rotation AAROM;Left;20 reps;Limitations    External Rotation Limitations 10 seconds    Internal Rotation AAROM;Left;20 reps;Limitations    Internal Rotation Limitations 10 seconds    Flexion AAROM;Left;20 reps;Limitations    Flexion Limitations 10 seconds      Shoulder Exercises: Seated   Retraction Strengthening;Both;20 reps;Limitations    Retraction Limitations 5 seconds shoulder blade pinches      Shoulder Exercises: Sidelying   External Rotation AROM;Left;10 reps;Limitations    External Rotation Limitations 2 sets      Shoulder Exercises: Pulleys   Flexion 3 minutes    Scaption 3 minutes                    PT Education - 12/27/20 1129     Education Details Encouraged  Molly Maduro to focus on capsular stretching and scapular strength work with RTC strength progressions starting week 10 (next week).    Person(s) Educated Patient    Methods Explanation;Demonstration;Tactile cues;Verbal cues;Handout    Comprehension Verbal cues required;Returned demonstration;Need further instruction;Tactile cues required;Verbalized understanding              PT Short Term Goals - 12/13/20 1036       PT SHORT TERM GOAL #1   Title independent with HEP    Time 4    Period Weeks    Status Achieved    Target Date 12/17/20      PT SHORT TERM GOAL #2   Title Lt shoulder PROM improved by 15 deg flex, abduction, and er for improved function    Time 4    Period Weeks    Status Achieved    Target Date 12/17/20               PT Long Term Goals - 12/27/20 1129       PT LONG TERM GOAL #1   Title independent with final HEP    Time 8    Period Days    Status On-going    Target Date 01/14/21      PT LONG TERM GOAL #2   Title improve Lt shoulder AROM to [facility] for improved function and mobility    Time 8    Period Weeks    Status On-going    Target Date 01/14/21      PT LONG TERM GOAL #3   Title demonstrate at least 4/5 Lt shoulder strength for improved function    Time 8    Period Weeks    Status On-going    Target Date 01/14/21      PT LONG TERM GOAL #4   Title FOTO score improved to 66 for improved function    Time 8    Period Weeks    Status On-going    Target Date 01/14/21      PT LONG TERM GOAL #5   Title report pain < 3/10 with activity for improved function    Baseline 0-4/10    Time 8    Period Weeks    Status On-going    Target Date 01/14/21                   Plan - 12/27/20 1131     Clinical Impression Statement AROM (assessed supine) is  better today than at 12/15/2020 assessment.  I encouraged Atsushi to focus on his capsular stretching and scapular strengthening (supine arm raises and shoulder blade pinches) over the weekend in  preparation for 90% or better range 12 weeks post-surgery and RTC strength progressions 10-12 weeks post-surgery.  Follow-up next week to continue AROM, strength and functional progressions to meet LTGs.    Personal Factors and Comorbidities Comorbidity 3+;Profession    Comorbidities HTN, stented coronary artery, CAD, MI    Examination-Activity Limitations Carry;Lift;Reach Overhead    Examination-Participation Restrictions Cleaning;Community Activity;Driving;Yard Work;Occupation    Stability/Clinical Decision Making Stable/Uncomplicated    Rehab Potential Good    PT Frequency 2x / week    PT Duration 4 weeks    PT Treatment/Interventions ADLs/Self Care Home Management;Aquatic Therapy;Cryotherapy;Electrical Stimulation;Moist Heat;Therapeutic exercise;Therapeutic activities;Functional mobility training;Ultrasound;Neuromuscular re-education;Patient/family education;Manual techniques;Vasopneumatic Device;Taping;Dry needling;Passive range of motion;Scar mobilization    PT Next Visit Plan Manual intervention for trigger point release prn.  Progress functional lifting strength program. Standing strengthening - work on decreasing shrug    PT Home Exercise Plan Access Code: [email or web address]    Consulted and Agree with Plan of Care Patient             Patient will benefit from skilled therapeutic intervention in order to improve the following deficits and impairments:  Pain, Impaired UE functional use, Decreased strength, Decreased range of motion, Impaired flexibility, Postural dysfunction, Increased edema  Visit Diagnosis: Abnormal posture  Muscle weakness (generalized)  Stiffness of left shoulder, not elsewhere classified  Acute pain of left shoulder     Problem List Patient Active Problem List   Diagnosis Date Noted   Biceps tendonitis, left    Traumatic complete tear of left rotator cuff    Atherosclerosis of native coronary artery of native heart with stable angina pectoris (HCC)  01/05/2017   Essential hypertension 01/05/2017   Stented coronary artery 08/08/2011   Smoking hx 08/08/2011   Hyperlipidemia 01/30/2010   Coronary atherosclerosis 01/30/2010    Cherlyn Cushing PT, MPT 12/27/2020, 11:34 AM  [facility] Physical Therapy [address] [place], [place], [address] Phone: [phone]   Fax:  [phone]  Name: Daran Favaro MRN: [id number] Date of Birth: [birth year]

## 2021-01-01 ENCOUNTER — Encounter: Payer: Self-pay | Admitting: Physical Therapy

## 2021-01-01 ENCOUNTER — Ambulatory Visit (INDEPENDENT_AMBULATORY_CARE_PROVIDER_SITE_OTHER): Payer: No Typology Code available for payment source | Admitting: Physical Therapy

## 2021-01-01 ENCOUNTER — Other Ambulatory Visit: Payer: Self-pay

## 2021-01-01 DIAGNOSIS — R293 Abnormal posture: Secondary | ICD-10-CM

## 2021-01-01 DIAGNOSIS — M6281 Muscle weakness (generalized): Secondary | ICD-10-CM

## 2021-01-01 DIAGNOSIS — R600 Localized edema: Secondary | ICD-10-CM

## 2021-01-01 DIAGNOSIS — M25612 Stiffness of left shoulder, not elsewhere classified: Secondary | ICD-10-CM

## 2021-01-01 DIAGNOSIS — M25512 Pain in left shoulder: Secondary | ICD-10-CM

## 2021-01-01 DIAGNOSIS — G8929 Other chronic pain: Secondary | ICD-10-CM

## 2021-01-01 NOTE — Therapy (Signed)
[facility] Physical Therapy [address] [place], [place], [address] Phone: [phone]   Fax:  [phone]  Physical Therapy Treatment  Patient Details  Name: Erik Perkins MRN: [id number] Date of Birth: March 13, 1961 Referring Provider (PT): Cammy Copa, MD   Encounter Date: 01/01/2021   PT End of Session - 01/01/21 1038     Visit Number 13    Number of Visits 15   15 VA visits approved   Date for PT Re-Evaluation 01/14/21    Authorization Type VA    Authorization Time Period 06/18/20- 02/25/2021    Authorization - Visit Number 13    Authorization - Number of Visits 15    PT Start Time 0845    PT Stop Time 0925    PT Time Calculation (min) 40 min    Activity Tolerance Patient tolerated treatment well;No increased pain    Behavior During Therapy [facility] for tasks assessed/performed             Past Medical History:  Diagnosis Date   Coronary artery disease    a. 01/2010 NSTEMI/PCI: Mild diff LDA/LCX dzs, moderate ostial D2/3 dzs. RCA 60-70p, 99d (3.5x12 Xience DES).   GERD (gastroesophageal reflux disease)    Hyperlipidemia    Hypertension    MI (myocardial infarction) (HCC)    10/2004, 01/2010    Past Surgical History:  Procedure Laterality Date   CARDIAC CATHETERIZATION     FRACTURE SURGERY Right    wrist  60yo   SHOULDER ARTHROSCOPY WITH OPEN ROTATOR CUFF REPAIR AND DISTAL CLAVICLE ACROMINECTOMY Left 10/23/2020   Procedure: LEFT SHOULDER ARTHROSCOPY, DEBRIDEMENT, MINI OPEN ROTATOR CUFF TEAR REPAIR, BICEPS TENODESIS;  Surgeon: Cammy Copa, MD;  Location: [facility] OR;  Service: Orthopedics;  Laterality: Left;   VASECTOMY  1984    There were no vitals filed for this visit.   Subjective Assessment - 01/01/21 1037     Subjective doing well, shoulder is just stiff    Pertinent History HTN, stented coronary artery, CAD, MI    Limitations Lifting    Patient Stated Goals Return to work, reach hand above head    Currently in Pain? No/denies     Pain Onset More than a month ago                               [facility] Adult PT Treatment/Exercise - 01/01/21 0847       Shoulder Exercises: Supine   Flexion Left;20 reps;Weights    Shoulder Flexion Weight (lbs) 2    Flexion Limitations 10 seconds      Shoulder Exercises: Sidelying   External Rotation Left;20 reps;Weights    External Rotation Weight (lbs) 2    ABduction Left;20 reps;Weights    ABduction Weight (lbs) 2      Shoulder Exercises: Standing   Flexion Both;Weights   3x10   Shoulder Flexion Weight (lbs) 1    Flexion Limitations to 90 deg - good awareness to avoid shrug    ABduction Both;Weights   3x10   Shoulder ABduction Weight (lbs) 1 on Rt; 0.5 on Lt    ABduction Limitations to 90 deg - good awareness to avoid shrug    Row Both   3x10   Theraband Level (Shoulder Row) Level 4 (Blue)      Shoulder Exercises: Pulleys   Flexion 3 minutes    Scaption 3 minutes      Manual Therapy   Passive ROM Lt  shoulder all directions to tolerance                      PT Short Term Goals - 12/13/20 1036       PT SHORT TERM GOAL #1   Title independent with HEP    Time 4    Period Weeks    Status Achieved    Target Date 12/17/20      PT SHORT TERM GOAL #2   Title Lt shoulder PROM improved by 15 deg flex, abduction, and er for improved function    Time 4    Period Weeks    Status Achieved    Target Date 12/17/20               PT Long Term Goals - 12/27/20 1129       PT LONG TERM GOAL #1   Title independent with final HEP    Time 8    Period Days    Status On-going    Target Date 01/14/21      PT LONG TERM GOAL #2   Title improve Lt shoulder AROM to [facility] for improved function and mobility    Time 8    Period Weeks    Status On-going    Target Date 01/14/21      PT LONG TERM GOAL #3   Title demonstrate at least 4/5 Lt shoulder strength for improved function    Time 8    Period Weeks    Status On-going    Target Date  01/14/21      PT LONG TERM GOAL #4   Title FOTO score improved to 66 for improved function    Time 8    Period Weeks    Status On-going    Target Date 01/14/21      PT LONG TERM GOAL #5   Title report pain < 3/10 with activity for improved function    Baseline 0-4/10    Time 8    Period Weeks    Status On-going    Target Date 01/14/21                   Plan - 01/01/21 1038     Clinical Impression Statement Pt tolerated session well today and demonstrating improved active motion in standing.  He will continue to focus on this at home and will continue to work on progressing strengthening as tolderated.  Will continue to benefit from PT to maximize function.    Personal Factors and Comorbidities Comorbidity 3+;Profession    Comorbidities HTN, stented coronary artery, CAD, MI    Examination-Activity Limitations Carry;Lift;Reach Overhead    Examination-Participation Restrictions Cleaning;Community Activity;Driving;Yard Work;Occupation    Stability/Clinical Decision Making Stable/Uncomplicated    Rehab Potential Good    PT Frequency 2x / week    PT Duration 4 weeks    PT Treatment/Interventions ADLs/Self Care Home Management;Aquatic Therapy;Cryotherapy;Electrical Stimulation;Moist Heat;Therapeutic exercise;Therapeutic activities;Functional mobility training;Ultrasound;Neuromuscular re-education;Patient/family education;Manual techniques;Vasopneumatic Device;Taping;Dry needling;Passive range of motion;Scar mobilization    PT Next Visit Plan Manual intervention for trigger point release prn.  Progress functional lifting strength program. Standing strengthening and ROM    PT Home Exercise Plan Access Code: [email or web address]    Consulted and Agree with Plan of Care Patient             Patient will benefit from skilled therapeutic intervention in order to improve the following deficits and impairments:  Pain, Impaired UE functional use, Decreased strength, Decreased range  of motion,  Impaired flexibility, Postural dysfunction, Increased edema  Visit Diagnosis: Abnormal posture  Muscle weakness (generalized)  Stiffness of left shoulder, not elsewhere classified  Acute pain of left shoulder  Localized edema  Chronic pain in left shoulder     Problem List Patient Active Problem List   Diagnosis Date Noted   Biceps tendonitis, left    Traumatic complete tear of left rotator cuff    Atherosclerosis of native coronary artery of native heart with stable angina pectoris (HCC) 01/05/2017   Essential hypertension 01/05/2017   Stented coronary artery 08/08/2011   Smoking hx 08/08/2011   Hyperlipidemia 01/30/2010   Coronary atherosclerosis 01/30/2010      Clarita Crane, PT, DPT 01/01/21 10:41 AM     [facility] Physical Therapy [address] [place] [address] [place], [place], [address] Phone: [phone]   Fax:  [phone]  Name: Erik Perkins MRN: [id number] Date of Birth: Jul 19, 1960

## 2021-01-03 ENCOUNTER — Other Ambulatory Visit: Payer: Self-pay

## 2021-01-03 ENCOUNTER — Encounter: Payer: Self-pay | Admitting: Physical Therapy

## 2021-01-03 ENCOUNTER — Ambulatory Visit (INDEPENDENT_AMBULATORY_CARE_PROVIDER_SITE_OTHER): Payer: No Typology Code available for payment source | Admitting: Physical Therapy

## 2021-01-03 DIAGNOSIS — R293 Abnormal posture: Secondary | ICD-10-CM | POA: Diagnosis not present

## 2021-01-03 DIAGNOSIS — M25612 Stiffness of left shoulder, not elsewhere classified: Secondary | ICD-10-CM

## 2021-01-03 DIAGNOSIS — M6281 Muscle weakness (generalized): Secondary | ICD-10-CM

## 2021-01-03 DIAGNOSIS — R600 Localized edema: Secondary | ICD-10-CM

## 2021-01-03 DIAGNOSIS — M25512 Pain in left shoulder: Secondary | ICD-10-CM

## 2021-01-03 DIAGNOSIS — G8929 Other chronic pain: Secondary | ICD-10-CM

## 2021-01-03 NOTE — Therapy (Signed)
[facility] Physical Therapy [address] [place], [place], [address] Phone: [phone]   Fax:  [phone]  Physical Therapy Treatment/Recertification  Patient Details  Name: Erik Perkins MRN: [id number] Date of Birth: Jan 02, 1961 Referring Provider (PT): Meredith Pel, MD   Encounter Date: 01/03/2021   PT End of Session - 01/03/21 0922     Visit Number 14    Number of Visits 26   [place] visits approved   Date for PT Re-Evaluation 02/14/21    Authorization Type VA    Authorization Time Period 06/18/20- 02/25/2021    Authorization - Visit Number 14   awaiting additional VA approval   Authorization - Number of Visits 15    PT Start Time 5093    PT Stop Time 0929    PT Time Calculation (min) 47 min    Activity Tolerance Patient tolerated treatment well;No increased pain    Behavior During Therapy [facility] for tasks assessed/performed             Past Medical History:  Diagnosis Date   Coronary artery disease    a. 01/2010 NSTEMI/PCI: Mild diff LDA/LCX dzs, moderate ostial D2/3 dzs. RCA 60-70p, 99d (3.5x12 Xience DES).   GERD (gastroesophageal reflux disease)    Hyperlipidemia    Hypertension    MI (myocardial infarction) ([place])    10/2004, 01/2010    Past Surgical History:  Procedure Laterality Date   CARDIAC CATHETERIZATION     FRACTURE SURGERY Right    wrist  60yo   SHOULDER ARTHROSCOPY WITH OPEN ROTATOR CUFF REPAIR AND DISTAL CLAVICLE ACROMINECTOMY Left 10/23/2020   Procedure: LEFT SHOULDER ARTHROSCOPY, DEBRIDEMENT, MINI OPEN ROTATOR CUFF TEAR REPAIR, BICEPS TENODESIS;  Surgeon: Meredith Pel, MD;  Location: [place];  Service: Orthopedics;  Laterality: Left;   VASECTOMY  1984    There were no vitals filed for this visit.   Subjective Assessment - 01/03/21 0846     Subjective pain still around 4-5/10, does report increase in Lt shoulder use though with pain remaining the same    Pertinent History HTN, stented coronary artery, CAD, MI     Limitations Lifting    Patient Stated Goals Return to work, reach hand above head    Currently in Pain? Yes    Pain Score 4     Pain Location Shoulder    Pain Orientation Left    Pain Descriptors / Indicators Tightness;Aching;Sore    Pain Type Chronic pain;Surgical pain    Pain Onset More than a month ago    Pain Frequency Constant    Aggravating Factors  overuse with yard work    Pain Relieving Factors rest                [facility] PT Assessment - 01/03/21 0852       Assessment   Medical Diagnosis Z98.890 (ICD-10-CM) - Status post rotator cuff repair    Referring Provider (PT) Meredith Pel, MD    Onset Date/Surgical Date 10/23/20      Observation/Other Assessments   Focus on Therapeutic Outcomes (FOTO)  65 (predicted 24)      AROM   Overall AROM Comments measured sitting abdct and flex    Left Shoulder Flexion 121 Degrees    Left Shoulder ABduction 123 Degrees    Left Shoulder Internal Rotation 85 Degrees    Left Shoulder External Rotation 61 Degrees      Strength   Strength Assessment Site Shoulder    Right/Left Shoulder Right;Left  Right Shoulder Flexion 5/5   56.1, 49.1 = 52.6#   Right Shoulder ABduction 5/5   51.9, 54.5 = 53.2#   Left Shoulder Flexion --   32.1, 32.5 = 32.3#   Left Shoulder ABduction --   20.9, 27.6 = 24.25#                          [facility] Adult PT Treatment/Exercise - 01/03/21 0847       Shoulder Exercises: Supine   Flexion Left;20 reps;Weights    Shoulder Flexion Weight (lbs) 2    Flexion Limitations 10 seconds      Shoulder Exercises: Sidelying   External Rotation Left;20 reps;Weights    External Rotation Weight (lbs) 2    ABduction Left;20 reps;Weights    ABduction Weight (lbs) 2      Shoulder Exercises: Pulleys   Flexion 3 minutes    Scaption 3 minutes      Shoulder Exercises: ROM/Strengthening   UBE (Upper Arm Bike) Lvl 4 8 mins (2 mins fwd/back alternating)    Proximal Shoulder Strengthening, Supine 2#  in 90 deg flexion; circles CW/CCW x 20 reps                      PT Short Term Goals - 12/13/20 1036       PT SHORT TERM GOAL #1   Title independent with HEP    Time 4    Period Weeks    Status Achieved    Target Date 12/17/20      PT SHORT TERM GOAL #2   Title Lt shoulder PROM improved by 15 deg flex, abduction, and er for improved function    Time 4    Period Weeks    Status Achieved    Target Date 12/17/20               PT Long Term Goals - 01/03/21 0923       PT LONG TERM GOAL #1   Title independent with final HEP    Time 6    Period Weeks    Status On-going    Target Date 02/14/21      PT LONG TERM GOAL #2   Title improve Lt shoulder AROM to [facility] for improved function and mobility    Time 86    Period Weeks    Status On-going    Target Date 02/14/21      PT LONG TERM GOAL #3   Title demonstrate at least 4/5 Lt shoulder strength for improved function    Baseline 7/28: 3/5    Time 6    Period Weeks    Status On-going    Target Date 02/14/21      PT LONG TERM GOAL #4   Title FOTO score improved to 66 for improved function    Baseline 7/28: 65    Time 6    Period Weeks    Status On-going    Target Date 02/14/21      PT LONG TERM GOAL #5   Title report pain < 3/10 with activity for improved function    Baseline 0-4/10    Time 6    Period Weeks    Status On-going    Target Date 02/14/21                   Plan - 01/03/21 0924     Clinical Impression Statement Pt is demonstrating progress towards  all LTGs just not met to date at this time.  Recommend continued PT at 1-2x/wk x 6 additional weeks to maximize function and ensure safe return to work and ADLs.  Will continue to benefit from PT to maximize function.    Personal Factors and Comorbidities Comorbidity 3+;Profession    Comorbidities HTN, stented coronary artery, CAD, MI    Examination-Activity Limitations Carry;Lift;Reach Overhead    Examination-Participation  Restrictions Cleaning;Community Activity;Driving;Yard Work;Occupation    Stability/Clinical Decision Making Stable/Uncomplicated    Rehab Potential Good    PT Frequency 2x / week    PT Duration 6 weeks    PT Treatment/Interventions ADLs/Self Care Home Management;Aquatic Therapy;Cryotherapy;Electrical Stimulation;Moist Heat;Therapeutic exercise;Therapeutic activities;Functional mobility training;Ultrasound;Neuromuscular re-education;Patient/family education;Manual techniques;Vasopneumatic Device;Taping;Dry needling;Passive range of motion;Scar mobilization    PT Next Visit Plan continue with strengthening and maximizing AROM as able    PT Home Exercise Plan Access Code: [email or web address]    Consulted and Agree with Plan of Care Patient             Patient will benefit from skilled therapeutic intervention in order to improve the following deficits and impairments:  Pain, Impaired UE functional use, Decreased strength, Decreased range of motion, Impaired flexibility, Postural dysfunction, Increased edema  Visit Diagnosis: Abnormal posture - Plan: PT plan of care cert/re-cert  Muscle weakness (generalized) - Plan: PT plan of care cert/re-cert  Stiffness of left shoulder, not elsewhere classified - Plan: PT plan of care cert/re-cert  Acute pain of left shoulder - Plan: PT plan of care cert/re-cert  Localized edema - Plan: PT plan of care cert/re-cert  Chronic pain in left shoulder - Plan: PT plan of care cert/re-cert     Problem List Patient Active Problem List   Diagnosis Date Noted   Biceps tendonitis, left    Traumatic complete tear of left rotator cuff    Atherosclerosis of native coronary artery of native heart with stable angina pectoris ([place]) 01/05/2017   Essential hypertension 01/05/2017   Stented coronary artery 08/08/2011   Smoking hx 08/08/2011   Hyperlipidemia 01/30/2010   Coronary atherosclerosis 01/30/2010      Laureen Abrahams, PT, DPT 01/03/21 9:28  AM     [facility] Physical Therapy [address] [place], [place], [address] Phone: [phone]   Fax:  [phone]  Name: Erik Perkins MRN: [id number] Date of Birth: [birth year]

## 2021-01-08 ENCOUNTER — Other Ambulatory Visit: Payer: Self-pay

## 2021-01-08 ENCOUNTER — Encounter: Payer: Self-pay | Admitting: Physical Therapy

## 2021-01-08 ENCOUNTER — Encounter: Payer: No Typology Code available for payment source | Admitting: Physical Therapy

## 2021-01-08 ENCOUNTER — Ambulatory Visit (INDEPENDENT_AMBULATORY_CARE_PROVIDER_SITE_OTHER): Payer: No Typology Code available for payment source | Admitting: Physical Therapy

## 2021-01-08 DIAGNOSIS — R293 Abnormal posture: Secondary | ICD-10-CM

## 2021-01-08 DIAGNOSIS — M25512 Pain in left shoulder: Secondary | ICD-10-CM

## 2021-01-08 DIAGNOSIS — M6281 Muscle weakness (generalized): Secondary | ICD-10-CM | POA: Diagnosis not present

## 2021-01-08 DIAGNOSIS — M25612 Stiffness of left shoulder, not elsewhere classified: Secondary | ICD-10-CM | POA: Diagnosis not present

## 2021-01-08 DIAGNOSIS — R600 Localized edema: Secondary | ICD-10-CM

## 2021-01-08 DIAGNOSIS — G8929 Other chronic pain: Secondary | ICD-10-CM

## 2021-01-08 NOTE — Therapy (Signed)
[facility] Physical Therapy [address] [place], [place], [address] Phone: [phone]   Fax:  [phone]  Physical Therapy Treatment  Patient Details  Name: Erik Perkins MRN: [id number] Date of Birth: 1961-01-23 Referring Provider (PT): Cammy Copa, MD   Encounter Date: 01/08/2021   PT End of Session - 01/08/21 1425     Visit Number 15    Number of Visits 26   15 VA visits approved   Date for PT Re-Evaluation 02/14/21    Authorization Type VA    Authorization Time Period 06/18/20- 02/25/2021    Authorization - Visit Number 15   awaiting additional VA approval   Authorization - Number of Visits 15    PT Start Time 1345    PT Stop Time 1428    PT Time Calculation (min) 43 min    Activity Tolerance Patient tolerated treatment well;No increased pain    Behavior During Therapy [facility] for tasks assessed/performed             Past Medical History:  Diagnosis Date   Coronary artery disease    a. 01/2010 NSTEMI/PCI: Mild diff LDA/LCX dzs, moderate ostial D2/3 dzs. RCA 60-70p, 99d (3.5x12 Xience DES).   GERD (gastroesophageal reflux disease)    Hyperlipidemia    Hypertension    MI (myocardial infarction) (HCC)    10/2004, 01/2010    Past Surgical History:  Procedure Laterality Date   CARDIAC CATHETERIZATION     FRACTURE SURGERY Right    wrist  60yo   SHOULDER ARTHROSCOPY WITH OPEN ROTATOR CUFF REPAIR AND DISTAL CLAVICLE ACROMINECTOMY Left 10/23/2020   Procedure: LEFT SHOULDER ARTHROSCOPY, DEBRIDEMENT, MINI OPEN ROTATOR CUFF TEAR REPAIR, BICEPS TENODESIS;  Surgeon: Cammy Copa, MD;  Location: [facility] OR;  Service: Orthopedics;  Laterality: Left;   VASECTOMY  1984    There were no vitals filed for this visit.   Subjective Assessment - 01/08/21 1346     Subjective shoulder is stiff after exercises    Pertinent History HTN, stented coronary artery, CAD, MI    Limitations Lifting    Patient Stated Goals Return to work, reach hand above head     Currently in Pain? Yes    Pain Score 4     Pain Location Shoulder    Pain Orientation Left    Pain Descriptors / Indicators Aching;Sore;Tightness    Pain Type Chronic pain;Surgical pain    Pain Onset More than a month ago    Pain Frequency Constant    Aggravating Factors  overuse    Pain Relieving Factors rest                               [facility] Adult PT Treatment/Exercise - 01/08/21 1349       Shoulder Exercises: Supine   Flexion Left;20 reps;Weights    Shoulder Flexion Weight (lbs) 2    Flexion Limitations 10 seconds      Shoulder Exercises: Sidelying   External Rotation Left;20 reps;Weights    External Rotation Weight (lbs) 2    ABduction Left;20 reps;Weights    ABduction Weight (lbs) 2      Shoulder Exercises: Standing   Flexion Left;20 reps;Weights    Shoulder Flexion Weight (lbs) 1    Flexion Limitations to 90 deg - good awareness to avoid shrug    ABduction Left;20 reps;Weights    Shoulder ABduction Weight (lbs) 0.5 on Lt    ABduction Limitations to 90 deg -  good awareness to avoid shrug    Other Standing Exercises flexion to horizontal abduction to adduction then reverse 2x10      Shoulder Exercises: Pulleys   Flexion 3 minutes    Scaption 3 minutes      Shoulder Exercises: ROM/Strengthening   UBE (Upper Arm Bike) L4 x 6 min (3' each direction)      Manual Therapy   Passive ROM Lt shoulder all directions to tolerance                      PT Short Term Goals - 12/13/20 1036       PT SHORT TERM GOAL #1   Title independent with HEP    Time 4    Period Weeks    Status Achieved    Target Date 12/17/20      PT SHORT TERM GOAL #2   Title Lt shoulder PROM improved by 15 deg flex, abduction, and er for improved function    Time 4    Period Weeks    Status Achieved    Target Date 12/17/20               PT Long Term Goals - 01/03/21 0923       PT LONG TERM GOAL #1   Title independent with final HEP    Time 6     Period Weeks    Status On-going    Target Date 02/14/21      PT LONG TERM GOAL #2   Title improve Lt shoulder AROM to [facility] for improved function and mobility    Time 86    Period Weeks    Status On-going    Target Date 02/14/21      PT LONG TERM GOAL #3   Title demonstrate at least 4/5 Lt shoulder strength for improved function    Baseline 7/28: 3/5    Time 6    Period Weeks    Status On-going    Target Date 02/14/21      PT LONG TERM GOAL #4   Title FOTO score improved to 66 for improved function    Baseline 7/28: 65    Time 6    Period Weeks    Status On-going    Target Date 02/14/21      PT LONG TERM GOAL #5   Title report pain < 3/10 with activity for improved function    Baseline 0-4/10    Time 6    Period Weeks    Status On-going    Target Date 02/14/21                   Plan - 01/08/21 1426     Clinical Impression Statement Pt tolerated session well today, with continued end range tightness noted with exercises and PROM.  Will continue to benefit from PT to maximize function.    Personal Factors and Comorbidities Comorbidity 3+;Profession    Comorbidities HTN, stented coronary artery, CAD, MI    Examination-Activity Limitations Carry;Lift;Reach Overhead    Examination-Participation Restrictions Cleaning;Community Activity;Driving;Yard Work;Occupation    Stability/Clinical Decision Making Stable/Uncomplicated    Rehab Potential Good    PT Frequency 2x / week    PT Duration 6 weeks    PT Treatment/Interventions ADLs/Self Care Home Management;Aquatic Therapy;Cryotherapy;Electrical Stimulation;Moist Heat;Therapeutic exercise;Therapeutic activities;Functional mobility training;Ultrasound;Neuromuscular re-education;Patient/family education;Manual techniques;Vasopneumatic Device;Taping;Dry needling;Passive range of motion;Scar mobilization    PT Next Visit Plan continue with strengthening and maximizing AROM as able, stretching  PRN    PT Home Exercise Plan  Access Code: [email or web address]    Consulted and Agree with Plan of Care Patient             Patient will benefit from skilled therapeutic intervention in order to improve the following deficits and impairments:  Pain, Impaired UE functional use, Decreased strength, Decreased range of motion, Impaired flexibility, Postural dysfunction, Increased edema  Visit Diagnosis: Abnormal posture  Muscle weakness (generalized)  Stiffness of left shoulder, not elsewhere classified  Acute pain of left shoulder  Localized edema  Chronic pain in left shoulder     Problem List Patient Active Problem List   Diagnosis Date Noted   Biceps tendonitis, left    Traumatic complete tear of left rotator cuff    Atherosclerosis of native coronary artery of native heart with stable angina pectoris (HCC) 01/05/2017   Essential hypertension 01/05/2017   Stented coronary artery 08/08/2011   Smoking hx 08/08/2011   Hyperlipidemia 01/30/2010   Coronary atherosclerosis 01/30/2010      Clarita Crane, PT, DPT 01/08/21 2:36 PM    [facility] Physical Therapy [address] [place], [place], [address] Phone: [phone]   Fax:  [phone]  Name: Erik Perkins MRN: [id number] Date of Birth: June 14, 1960

## 2021-01-10 ENCOUNTER — Encounter: Payer: No Typology Code available for payment source | Admitting: Physical Therapy

## 2021-01-11 ENCOUNTER — Other Ambulatory Visit: Payer: Self-pay | Admitting: *Deleted

## 2021-01-11 MED ORDER — METOPROLOL TARTRATE 25 MG PO TABS: 12.5000 mg | ORAL_TABLET | Freq: Two times a day (BID) | ORAL | 3 refills | Status: DC

## 2021-01-15 ENCOUNTER — Encounter: Payer: Self-pay | Admitting: Physical Therapy

## 2021-01-15 ENCOUNTER — Other Ambulatory Visit: Payer: Self-pay

## 2021-01-15 ENCOUNTER — Ambulatory Visit (INDEPENDENT_AMBULATORY_CARE_PROVIDER_SITE_OTHER): Payer: No Typology Code available for payment source | Admitting: Physical Therapy

## 2021-01-15 DIAGNOSIS — M25512 Pain in left shoulder: Secondary | ICD-10-CM | POA: Diagnosis not present

## 2021-01-15 DIAGNOSIS — R600 Localized edema: Secondary | ICD-10-CM

## 2021-01-15 DIAGNOSIS — M6281 Muscle weakness (generalized): Secondary | ICD-10-CM

## 2021-01-15 DIAGNOSIS — G8929 Other chronic pain: Secondary | ICD-10-CM

## 2021-01-15 DIAGNOSIS — M25612 Stiffness of left shoulder, not elsewhere classified: Secondary | ICD-10-CM | POA: Diagnosis not present

## 2021-01-15 DIAGNOSIS — R293 Abnormal posture: Secondary | ICD-10-CM | POA: Diagnosis not present

## 2021-01-15 NOTE — Therapy (Signed)
[facility] Physical Therapy [address] [place], [place], [address] Phone: [phone]   Fax:  [phone]  Physical Therapy Treatment  Patient Details  Name: Erik Perkins MRN: [id number] Date of Birth: [birth year] Referring Provider (PT): Cammy Copa, MD   Encounter Date: 01/15/2021   PT End of Session - 01/15/21 0836     Visit Number 16    Number of Visits 26    Date for PT Re-Evaluation 02/14/21    Authorization Type VA    Authorization Time Period 06/18/20- 02/25/2021 - new 15 visits approved starting 7/13    Authorization - Visit Number 8   awaiting additional VA approval   Authorization - Number of Visits 15    PT Start Time 0756    PT Stop Time 0838    PT Time Calculation (min) 42 min    Activity Tolerance Patient tolerated treatment well;No increased pain    Behavior During Therapy [facility] for tasks assessed/performed             Past Medical History:  Diagnosis Date   Coronary artery disease    a. 01/2010 NSTEMI/PCI: Mild diff LDA/LCX dzs, moderate ostial D2/3 dzs. RCA 60-70p, 99d (3.5x12 Xience DES).   GERD (gastroesophageal reflux disease)    Hyperlipidemia    Hypertension    MI (myocardial infarction) (HCC)    10/2004, 01/2010    Past Surgical History:  Procedure Laterality Date   CARDIAC CATHETERIZATION     FRACTURE SURGERY Right    wrist  60yo   SHOULDER ARTHROSCOPY WITH OPEN ROTATOR CUFF REPAIR AND DISTAL CLAVICLE ACROMINECTOMY Left 10/23/2020   Procedure: LEFT SHOULDER ARTHROSCOPY, DEBRIDEMENT, MINI OPEN ROTATOR CUFF TEAR REPAIR, BICEPS TENODESIS;  Surgeon: Cammy Copa, MD;  Location: [facility] OR;  Service: Orthopedics;  Laterality: Left;   VASECTOMY  1984    There were no vitals filed for this visit.   Subjective Assessment - 01/15/21 0759     Subjective insurance appears to be sorted out for now, shoulder doing okay    Pertinent History HTN, stented coronary artery, CAD, MI    Limitations Lifting    Patient Stated  Goals Return to work, reach hand above head    Currently in Pain? Yes    Pain Score 4     Pain Location Shoulder    Pain Orientation Left    Pain Descriptors / Indicators Aching;Sore;Tightness    Pain Type Surgical pain;Chronic pain    Pain Onset More than a month ago    Pain Frequency Constant    Aggravating Factors  overuse, worse in AM    Pain Relieving Factors rest                [facility] PT Assessment - 01/15/21 0806       Assessment   Medical Diagnosis Z98.890 (ICD-10-CM) - Status post rotator cuff repair    Referring Provider (PT) Cammy Copa, MD      AROM   Left Shoulder Flexion 130 Degrees    Left Shoulder ABduction 140 Degrees    Left Shoulder Internal Rotation 90 Degrees    Left Shoulder External Rotation 75 Degrees                           [facility] Adult PT Treatment/Exercise - 01/15/21 0800       Shoulder Exercises: Supine   Flexion Left;20 reps;Weights    Shoulder Flexion Weight (lbs) 2    Flexion Limitations  10 seconds      Shoulder Exercises: Sidelying   External Rotation Left;20 reps;Weights    External Rotation Weight (lbs) 2    ABduction Left;20 reps;Weights    ABduction Weight (lbs) 2    ABduction Limitations 10 sec hold      Shoulder Exercises: Pulleys   Flexion 3 minutes    Scaption 3 minutes      Shoulder Exercises: ROM/Strengthening   UBE (Upper Arm Bike) L5 x 8 min (alt 2' each direction)      Manual Therapy   Passive ROM Lt shoulder all directions to tolerance                      PT Short Term Goals - 12/13/20 1036       PT SHORT TERM GOAL #1   Title independent with HEP    Time 4    Period Weeks    Status Achieved    Target Date 12/17/20      PT SHORT TERM GOAL #2   Title Lt shoulder PROM improved by 15 deg flex, abduction, and er for improved function    Time 4    Period Weeks    Status Achieved    Target Date 12/17/20               PT Long Term Goals - 01/03/21 0923        PT LONG TERM GOAL #1   Title independent with final HEP    Time 6    Period Weeks    Status On-going    Target Date 02/14/21      PT LONG TERM GOAL #2   Title improve Lt shoulder AROM to [facility] for improved function and mobility    Time 86    Period Weeks    Status On-going    Target Date 02/14/21      PT LONG TERM GOAL #3   Title demonstrate at least 4/5 Lt shoulder strength for improved function    Baseline 7/28: 3/5    Time 6    Period Weeks    Status On-going    Target Date 02/14/21      PT LONG TERM GOAL #4   Title FOTO score improved to 66 for improved function    Baseline 7/28: 65    Time 6    Period Weeks    Status On-going    Target Date 02/14/21      PT LONG TERM GOAL #5   Title report pain < 3/10 with activity for improved function    Baseline 0-4/10    Time 6    Period Weeks    Status On-going    Target Date 02/14/21                   Plan - 01/15/21 0948     Clinical Impression Statement Pt is demonstrating steady progress with AROM showing improvements in all directions today.  His biggest issue at this time is continued stiffness.  His shoulder ROM does improve quickly with stretching exercises but then tightens back up after.  Will continue to benefit from PT to maximize ROM and strength.    Personal Factors and Comorbidities Comorbidity 3+;Profession    Comorbidities HTN, stented coronary artery, CAD, MI    Examination-Activity Limitations Carry;Lift;Reach Overhead    Examination-Participation Restrictions Cleaning;Community Activity;Driving;Yard Work;Occupation    Stability/Clinical Decision Making Stable/Uncomplicated    Rehab Potential Good    PT  Frequency 2x / week    PT Duration 6 weeks    PT Treatment/Interventions ADLs/Self Care Home Management;Aquatic Therapy;Cryotherapy;Electrical Stimulation;Moist Heat;Therapeutic exercise;Therapeutic activities;Functional mobility training;Ultrasound;Neuromuscular re-education;Patient/family  education;Manual techniques;Vasopneumatic Device;Taping;Dry needling;Passive range of motion;Scar mobilization    PT Next Visit Plan continue with strengthening and maximizing AROM as able, stretching PRN; standing strengthening as able    PT Home Exercise Plan Access Code: [email or web address]    Consulted and Agree with Plan of Care Patient             Patient will benefit from skilled therapeutic intervention in order to improve the following deficits and impairments:  Pain, Impaired UE functional use, Decreased strength, Decreased range of motion, Impaired flexibility, Postural dysfunction, Increased edema  Visit Diagnosis: Abnormal posture  Muscle weakness (generalized)  Stiffness of left shoulder, not elsewhere classified  Acute pain of left shoulder  Localized edema  Chronic pain in left shoulder     Problem List Patient Active Problem List   Diagnosis Date Noted   Biceps tendonitis, left    Traumatic complete tear of left rotator cuff    Atherosclerosis of native coronary artery of native heart with stable angina pectoris (HCC) 01/05/2017   Essential hypertension 01/05/2017   Stented coronary artery 08/08/2011   Smoking hx 08/08/2011   Hyperlipidemia 01/30/2010   Coronary atherosclerosis 01/30/2010     Clarita Crane, PT, DPT 01/15/21 9:51 AM     [facility] Physical Therapy [address] [place], [place], [address] Phone: [phone]   Fax:  [phone]  Name: Adler Chartrand MRN: [id number] Date of Birth: 05/03/1961

## 2021-01-16 ENCOUNTER — Ambulatory Visit (INDEPENDENT_AMBULATORY_CARE_PROVIDER_SITE_OTHER): Payer: No Typology Code available for payment source | Admitting: Physical Therapy

## 2021-01-16 ENCOUNTER — Encounter: Payer: Self-pay | Admitting: Orthopedic Surgery

## 2021-01-16 ENCOUNTER — Ambulatory Visit (INDEPENDENT_AMBULATORY_CARE_PROVIDER_SITE_OTHER): Payer: No Typology Code available for payment source | Admitting: Orthopedic Surgery

## 2021-01-16 ENCOUNTER — Encounter: Payer: Self-pay | Admitting: Physical Therapy

## 2021-01-16 DIAGNOSIS — M25512 Pain in left shoulder: Secondary | ICD-10-CM

## 2021-01-16 DIAGNOSIS — R293 Abnormal posture: Secondary | ICD-10-CM

## 2021-01-16 DIAGNOSIS — R600 Localized edema: Secondary | ICD-10-CM

## 2021-01-16 DIAGNOSIS — M25612 Stiffness of left shoulder, not elsewhere classified: Secondary | ICD-10-CM | POA: Diagnosis not present

## 2021-01-16 DIAGNOSIS — M6281 Muscle weakness (generalized): Secondary | ICD-10-CM

## 2021-01-16 DIAGNOSIS — G8929 Other chronic pain: Secondary | ICD-10-CM

## 2021-01-16 DIAGNOSIS — Z9889 Other specified postprocedural states: Secondary | ICD-10-CM

## 2021-01-16 NOTE — Therapy (Signed)
[facility] Physical Therapy [address] [place], [place], [address] Phone: [phone]   Fax:  [phone]  Physical Therapy Treatment  Patient Details  Name: Erik Perkins MRN: [id number] Date of Birth: Feb 22, 1961 Referring Provider (PT): Cammy Copa, MD   Encounter Date: 01/16/2021   PT End of Session - 01/16/21 1512     Visit Number 17    Number of Visits 26    Date for PT Re-Evaluation 02/14/21    Authorization Type VA    Authorization Time Period 06/18/20- 02/25/2021 - new 15 visits approved starting 7/13    Authorization - Visit Number 9   awaiting additional VA approval   Authorization - Number of Visits 15    PT Start Time 1429    PT Stop Time 1511    PT Time Calculation (min) 42 min    Activity Tolerance Patient tolerated treatment well;No increased pain    Behavior During Therapy [facility] for tasks assessed/performed             Past Medical History:  Diagnosis Date   Coronary artery disease    a. 01/2010 NSTEMI/PCI: Mild diff LDA/LCX dzs, moderate ostial D2/3 dzs. RCA 60-70p, 99d (3.5x12 Xience DES).   GERD (gastroesophageal reflux disease)    Hyperlipidemia    Hypertension    MI (myocardial infarction) (HCC)    10/2004, 01/2010    Past Surgical History:  Procedure Laterality Date   CARDIAC CATHETERIZATION     FRACTURE SURGERY Right    wrist  60yo   SHOULDER ARTHROSCOPY WITH OPEN ROTATOR CUFF REPAIR AND DISTAL CLAVICLE ACROMINECTOMY Left 10/23/2020   Procedure: LEFT SHOULDER ARTHROSCOPY, DEBRIDEMENT, MINI OPEN ROTATOR CUFF TEAR REPAIR, BICEPS TENODESIS;  Surgeon: Cammy Copa, MD;  Location: [facility] OR;  Service: Orthopedics;  Laterality: Left;   VASECTOMY  1984    There were no vitals filed for this visit.   Subjective Assessment - 01/16/21 1431     Subjective shoulder is sore from yesterday (PT and deck renovation)    Pertinent History HTN, stented coronary artery, CAD, MI    Limitations Lifting    Patient Stated Goals  Return to work, reach hand above head    Currently in Pain? Yes    Pain Score 3     Pain Location Shoulder    Pain Orientation Left    Pain Descriptors / Indicators Aching;Sore;Tightness    Pain Onset More than a month ago    Pain Frequency Constant    Aggravating Factors  overuse, lifting, stretchin    Pain Relieving Factors rest                               [facility] Adult PT Treatment/Exercise - 01/16/21 1432       Shoulder Exercises: Supine   Flexion Left;20 reps;Weights    Shoulder Flexion Weight (lbs) 2    Flexion Limitations 10 seconds      Shoulder Exercises: Sidelying   External Rotation Left;20 reps;Weights    External Rotation Weight (lbs) 2    ABduction Left;20 reps;Weights    ABduction Weight (lbs) 2    ABduction Limitations 10 sec hold      Shoulder Exercises: Standing   Flexion Left;20 reps;Weights    Shoulder Flexion Weight (lbs) 2    Flexion Limitations to 90 deg - good awareness to avoid shrug    Other Standing Exercises bicep curl with overhead press 2# on Lt  Other Standing Exercises flexion to horizontal abduction to adduction then reverse 2x10; 1#      Shoulder Exercises: Pulleys   Flexion 3 minutes    Scaption 3 minutes      Shoulder Exercises: ROM/Strengthening   UBE (Upper Arm Bike) L5 x 6 min (3' fwd/bwd)    Proximal Shoulder Strengthening, Supine 2# in 90 deg flexion; circles CW/CCW x 20 reps    Rhythmic Stabilization, Supine with 2# in lt; various directions 3x30 sec                      PT Short Term Goals - 12/13/20 1036       PT SHORT TERM GOAL #1   Title independent with HEP    Time 4    Period Weeks    Status Achieved    Target Date 12/17/20      PT SHORT TERM GOAL #2   Title Lt shoulder PROM improved by 15 deg flex, abduction, and er for improved function    Time 4    Period Weeks    Status Achieved    Target Date 12/17/20               PT Long Term Goals - 01/03/21 0923       PT  LONG TERM GOAL #1   Title independent with final HEP    Time 6    Period Weeks    Status On-going    Target Date 02/14/21      PT LONG TERM GOAL #2   Title improve Lt shoulder AROM to [facility] for improved function and mobility    Time 86    Period Weeks    Status On-going    Target Date 02/14/21      PT LONG TERM GOAL #3   Title demonstrate at least 4/5 Lt shoulder strength for improved function    Baseline 7/28: 3/5    Time 6    Period Weeks    Status On-going    Target Date 02/14/21      PT LONG TERM GOAL #4   Title FOTO score improved to 66 for improved function    Baseline 7/28: 65    Time 6    Period Weeks    Status On-going    Target Date 02/14/21      PT LONG TERM GOAL #5   Title report pain < 3/10 with activity for improved function    Baseline 0-4/10    Time 6    Period Weeks    Status On-going    Target Date 02/14/21                   Plan - 01/16/21 1512     Clinical Impression Statement Pt tolerated session well today with continued focus on maximizing ROM and strengthening exercises.  Will continue to benefit from PT to maximize function.    Personal Factors and Comorbidities Comorbidity 3+;Profession    Comorbidities HTN, stented coronary artery, CAD, MI    Examination-Activity Limitations Carry;Lift;Reach Overhead    Examination-Participation Restrictions Cleaning;Community Activity;Driving;Yard Work;Occupation    Stability/Clinical Decision Making Stable/Uncomplicated    Rehab Potential Good    PT Frequency 2x / week    PT Duration 6 weeks    PT Treatment/Interventions ADLs/Self Care Home Management;Aquatic Therapy;Cryotherapy;Electrical Stimulation;Moist Heat;Therapeutic exercise;Therapeutic activities;Functional mobility training;Ultrasound;Neuromuscular re-education;Patient/family education;Manual techniques;Vasopneumatic Device;Taping;Dry needling;Passive range of motion;Scar mobilization    PT Next Visit Plan continue with strengthening  and maximizing AROM as able, stretching PRN; standing strengthening    PT Home Exercise Plan Access Code: [email or web address]    Consulted and Agree with Plan of Care Patient             Patient will benefit from skilled therapeutic intervention in order to improve the following deficits and impairments:  Pain, Impaired UE functional use, Decreased strength, Decreased range of motion, Impaired flexibility, Postural dysfunction, Increased edema  Visit Diagnosis: Abnormal posture  Muscle weakness (generalized)  Stiffness of left shoulder, not elsewhere classified  Acute pain of left shoulder  Localized edema  Chronic pain in left shoulder     Problem List Patient Active Problem List   Diagnosis Date Noted   Biceps tendonitis, left    Traumatic complete tear of left rotator cuff    Atherosclerosis of native coronary artery of native heart with stable angina pectoris (HCC) 01/05/2017   Essential hypertension 01/05/2017   Stented coronary artery 08/08/2011   Smoking hx 08/08/2011   Hyperlipidemia 01/30/2010   Coronary atherosclerosis 01/30/2010      Clarita Crane, PT, DPT 01/16/21 3:14 PM     [place] Physical Therapy [address] [place], [place], [address] Phone: [phone]   Fax:  [phone]  Name: Wendel Homeyer MRN: [id number] Date of Birth: 15-Mar-1961

## 2021-01-16 NOTE — Progress Notes (Signed)
Post-Op Visit Note   Patient: Erik Perkins           Date of Birth: [birth year]           MRN: [id number] Visit Date: 01/16/2021 PCP: [facility]   Assessment & Plan:  Chief Complaint:  Chief Complaint  Patient presents with   Other    10/23/20 left shoulder scope with RCR   Visit Diagnoses:  1. Status post rotator cuff repair     Plan: Patient is a 60 year old male who presents s/p left shoulder arthroscopy with rotator cuff repair on 10/23/2020.  He is doing well with no complaints.  He does not have to take any medication for pain.  Feels like he is progressively improving.  He is able to sleep in bed with no issues and sleeps through the night.  Progressing well with physical therapy.  He is currently redoing his deck and cautioned him against heavy overhead lifting or lifting out away from his body.  He understands the risks of heavy physical activity in regards to repairing the rotator cuff.  On exam he has well-healed incision.  5/5 motor strength of supra, infra, subscap.  Externally rotates to 45 degrees, abduction to 80 degrees, forward flexes to 125 degrees.  Plan to have him return to work on Monday and follow-up with the office as needed.  Follow-Up Instructions: No follow-ups on file.   Orders:  No orders of the defined types were placed in this encounter.  No orders of the defined types were placed in this encounter.   Imaging: No results found.  PMFS History: Patient Active Problem List   Diagnosis Date Noted   Biceps tendonitis, left    Traumatic complete tear of left rotator cuff    Atherosclerosis of native coronary artery of native heart with stable angina pectoris (HCC) 01/05/2017   Essential hypertension 01/05/2017   Stented coronary artery 08/08/2011   Smoking hx 08/08/2011   Hyperlipidemia 01/30/2010   Coronary atherosclerosis 01/30/2010   Past Medical History:  Diagnosis Date   Coronary artery disease    a. 01/2010 NSTEMI/PCI:  Mild diff LDA/LCX dzs, moderate ostial D2/3 dzs. RCA 60-70p, 99d (3.5x12 Xience DES).   GERD (gastroesophageal reflux disease)    Hyperlipidemia    Hypertension    MI (myocardial infarction) (HCC)    10/2004, 01/2010    Family History  Problem Relation Age of Onset   Coronary artery disease Sister        stents placed   Diabetes Sister    Hyperlipidemia Sister    Cancer Father        lung   Hyperlipidemia Father     Past Surgical History:  Procedure Laterality Date   CARDIAC CATHETERIZATION     FRACTURE SURGERY Right    wrist  60yo   SHOULDER ARTHROSCOPY WITH OPEN ROTATOR CUFF REPAIR AND DISTAL CLAVICLE ACROMINECTOMY Left 10/23/2020   Procedure: LEFT SHOULDER ARTHROSCOPY, DEBRIDEMENT, MINI OPEN ROTATOR CUFF TEAR REPAIR, BICEPS TENODESIS;  Surgeon: Cammy Copa, MD;  Location: [facility] OR;  Service: Orthopedics;  Laterality: Left;   VASECTOMY  1984   Social History   Occupational History   Not on file  Tobacco Use   Smoking status: Former    Packs/day: 1.00    Years: 25.00    Pack years: 25.00    Types: Cigarettes    Quit date: 01/01/2010    Years since quitting: 11.0   Smokeless tobacco: Never  Vaping Use  Vaping Use: Never used  Substance and Sexual Activity   Alcohol use: Yes    Alcohol/week: 4.0 standard drinks    Types: 2 Cans of beer, 2 Standard drinks or equivalent per week    Comment: about every two months   Drug use: No   Sexual activity: Yes    Birth control/protection: Surgical

## 2021-01-28 ENCOUNTER — Encounter: Payer: No Typology Code available for payment source | Admitting: Physical Therapy

## 2021-01-28 ENCOUNTER — Telehealth: Payer: Self-pay | Admitting: Physical Therapy

## 2021-01-28 NOTE — Telephone Encounter (Signed)
Pt did not realize he had PT appt scheduled for today.  He has confirmed next scheduled appt.  Clarita Crane, PT, DPT 01/28/21 2:49 PM

## 2021-01-31 ENCOUNTER — Encounter: Payer: Self-pay | Admitting: Rehabilitative and Restorative Service Providers"

## 2021-01-31 ENCOUNTER — Other Ambulatory Visit: Payer: Self-pay

## 2021-01-31 ENCOUNTER — Ambulatory Visit (INDEPENDENT_AMBULATORY_CARE_PROVIDER_SITE_OTHER): Payer: No Typology Code available for payment source | Admitting: Rehabilitative and Restorative Service Providers"

## 2021-01-31 DIAGNOSIS — M25612 Stiffness of left shoulder, not elsewhere classified: Secondary | ICD-10-CM | POA: Diagnosis not present

## 2021-01-31 DIAGNOSIS — M25512 Pain in left shoulder: Secondary | ICD-10-CM | POA: Diagnosis not present

## 2021-01-31 DIAGNOSIS — R293 Abnormal posture: Secondary | ICD-10-CM

## 2021-01-31 DIAGNOSIS — R600 Localized edema: Secondary | ICD-10-CM

## 2021-01-31 DIAGNOSIS — G8929 Other chronic pain: Secondary | ICD-10-CM

## 2021-01-31 DIAGNOSIS — M6281 Muscle weakness (generalized): Secondary | ICD-10-CM | POA: Diagnosis not present

## 2021-01-31 NOTE — Therapy (Signed)
[facility] [address] [place], [place], [address] Phone: [phone]   Fax:  [phone]  Physical Therapy Treatment  Patient Details  Name: Erik Perkins MRN: [id number] Date of Birth: 09/28/1960 Referring Provider (PT): Cammy Copa, MD   Encounter Date: 01/31/2021   PT End of Session - 01/31/21 1501     Visit Number 18    Number of Visits 26    Date for PT Re-Evaluation 02/14/21    Authorization Type VA    Authorization Time Period 06/18/20- 02/25/2021 - new 15 visits approved starting 7/13    Authorization - Visit Number 9    Authorization - Number of Visits 15    PT Start Time 1504    PT Stop Time 1543    PT Time Calculation (min) 39 min    Activity Tolerance Patient tolerated treatment well;No increased pain    Behavior During Therapy [facility] for tasks assessed/performed             Past Medical History:  Diagnosis Date   Coronary artery disease    a. 01/2010 NSTEMI/PCI: Mild diff LDA/LCX dzs, moderate ostial D2/3 dzs. RCA 60-70p, 99d (3.5x12 Xience DES).   GERD (gastroesophageal reflux disease)    Hyperlipidemia    Hypertension    MI (myocardial infarction) (HCC)    10/2004, 01/2010    Past Surgical History:  Procedure Laterality Date   CARDIAC CATHETERIZATION     FRACTURE SURGERY Right    wrist  60yo   SHOULDER ARTHROSCOPY WITH OPEN ROTATOR CUFF REPAIR AND DISTAL CLAVICLE ACROMINECTOMY Left 10/23/2020   Procedure: LEFT SHOULDER ARTHROSCOPY, DEBRIDEMENT, MINI OPEN ROTATOR CUFF TEAR REPAIR, BICEPS TENODESIS;  Surgeon: Cammy Copa, MD;  Location: [facility] OR;  Service: Orthopedics;  Laterality: Left;   VASECTOMY  1984    There were no vitals filed for this visit.   Subjective Assessment - 01/31/21 1504     Subjective Pt. indicated he didn't have pain at rest.  Reported stiffness and top of range superior shoulder complaint.    Pertinent History HTN, stented coronary artery, CAD, MI    Limitations Lifting     Patient Stated Goals Return to work, reach hand above head    Currently in Pain? No/denies   no pain at rest   Pain Score 0-No pain    Pain Onset More than a month ago                [facility] PT Assessment - 01/31/21 0001       Assessment   Medical Diagnosis Z98.890 (ICD-10-CM) - Status post rotator cuff repair    Referring Provider (PT) Cammy Copa, MD    Onset Date/Surgical Date 10/23/20    Hand Dominance Right      Strength   Left Shoulder Flexion --   30 lbs tested in sitting c pressure applied just proximal to elbow.  16 lbs in standard MMT pressure application                          [facility] Adult PT Treatment/Exercise - 01/31/21 0001       Shoulder Exercises: Sidelying   External Rotation Left   3 x 10   External Rotation Weight (lbs) 3      Shoulder Exercises: Standing   Flexion Both   3 x 15 to approx. 110 degrees   Shoulder Flexion Weight (lbs) 2    ABduction Both   0-90  3  x 10   Other Standing Exercises bicep curl with overhead press 2# on Lt x 20    Other Standing Exercises flexion to horizontal abduction to adduction then reverse x 10 1 lb      Shoulder Exercises: Pulleys   Flexion 3 minutes    Scaption 3 minutes      Shoulder Exercises: ROM/Strengthening   UBE (Upper Arm Bike) L5 x 6 min (3' fwd/bwd)    Proximal Shoulder Strengthening, Supine standing 90 deg flexion; circles CW/CCW 30 x 2 2 lb ball                      PT Short Term Goals - 12/13/20 1036       PT SHORT TERM GOAL #1   Title independent with HEP    Time 4    Period Weeks    Status Achieved    Target Date 12/17/20      PT SHORT TERM GOAL #2   Title Lt shoulder PROM improved by 15 deg flex, abduction, and er for improved function    Time 4    Period Weeks    Status Achieved    Target Date 12/17/20               PT Long Term Goals - 01/31/21 1538       PT LONG TERM GOAL #1   Title independent with final HEP    Time 6    Period  Weeks    Status On-going    Target Date 02/14/21      PT LONG TERM GOAL #2   Title improve Lt shoulder AROM to [facility] for improved function and mobility    Time 6    Period Weeks    Status On-going    Target Date 02/14/21      PT LONG TERM GOAL #3   Title demonstrate at least 4/5 Lt shoulder strength for improved function    Time 6    Period Weeks    Status On-going    Target Date 02/14/21      PT LONG TERM GOAL #4   Title FOTO score improved to 66 for improved function    Time 6    Period Weeks    Status On-going    Target Date 02/14/21      PT LONG TERM GOAL #5   Title report pain < 3/10 with activity for improved function    Time 6    Period Weeks    Status On-going    Target Date 02/14/21                   Plan - 01/31/21 1532     Clinical Impression Statement Elevation attempts in flexion against gravity to approx. 120 degrees c shrug noted towards end of movement.  Pt. demonstrate end range limitations in passive as well as the strength deficits that are present.  Continued skilled PT services warranted.    Personal Factors and Comorbidities Comorbidity 3+;Profession    Comorbidities HTN, stented coronary artery, CAD, MI    Examination-Activity Limitations Carry;Lift;Reach Overhead    Examination-Participation Restrictions Cleaning;Community Activity;Driving;Yard Work;Occupation    Stability/Clinical Decision Making Stable/Uncomplicated    Rehab Potential Good    PT Frequency 2x / week    PT Duration 6 weeks    PT Treatment/Interventions ADLs/Self Care Home Management;Aquatic Therapy;Cryotherapy;Electrical Stimulation;Moist Heat;Therapeutic exercise;Therapeutic activities;Functional mobility training;Ultrasound;Neuromuscular re-education;Patient/family education;Manual techniques;Vasopneumatic Device;Taping;Dry needling;Passive range of motion;Scar mobilization  PT Next Visit Plan Progress resistance in gravity resisted elevations, overhead control  improvements    PT Home Exercise Plan Access Code: [email or web address]    Consulted and Agree with Plan of Care Patient             Patient will benefit from skilled therapeutic intervention in order to improve the following deficits and impairments:  Pain, Impaired UE functional use, Decreased strength, Decreased range of motion, Impaired flexibility, Postural dysfunction, Increased edema  Visit Diagnosis: Abnormal posture  Muscle weakness (generalized)  Stiffness of left shoulder, not elsewhere classified  Acute pain of left shoulder  Localized edema  Chronic pain in left shoulder     Problem List Patient Active Problem List   Diagnosis Date Noted   Biceps tendonitis, left    Traumatic complete tear of left rotator cuff    Atherosclerosis of native coronary artery of native heart with stable angina pectoris (HCC) 01/05/2017   Essential hypertension 01/05/2017   Stented coronary artery 08/08/2011   Smoking hx 08/08/2011   Hyperlipidemia 01/30/2010   Coronary atherosclerosis 01/30/2010   Chyrel Masson, PT, DPT, OCS, ATC 01/31/21  3:39 PM    [facility] [address] [place], [place], [address] Phone: [phone]   Fax:  [phone]  Name: Murvin Gift MRN: [id number] Date of Birth: 1961-06-04

## 2021-02-04 ENCOUNTER — Encounter: Payer: Self-pay | Admitting: Physical Therapy

## 2021-02-04 ENCOUNTER — Ambulatory Visit (INDEPENDENT_AMBULATORY_CARE_PROVIDER_SITE_OTHER): Payer: No Typology Code available for payment source | Admitting: Physical Therapy

## 2021-02-04 ENCOUNTER — Other Ambulatory Visit: Payer: Self-pay

## 2021-02-04 DIAGNOSIS — M6281 Muscle weakness (generalized): Secondary | ICD-10-CM

## 2021-02-04 DIAGNOSIS — M25612 Stiffness of left shoulder, not elsewhere classified: Secondary | ICD-10-CM

## 2021-02-04 DIAGNOSIS — G8929 Other chronic pain: Secondary | ICD-10-CM

## 2021-02-04 DIAGNOSIS — R293 Abnormal posture: Secondary | ICD-10-CM | POA: Diagnosis not present

## 2021-02-04 DIAGNOSIS — M25512 Pain in left shoulder: Secondary | ICD-10-CM | POA: Diagnosis not present

## 2021-02-04 DIAGNOSIS — R600 Localized edema: Secondary | ICD-10-CM

## 2021-02-04 NOTE — Patient Instructions (Signed)
Access Code: [email or web address] URL: [email or web address] Date: 02/04/2021 Prepared by: Moshe Cipro  Exercises Sidelying Shoulder Abduction Palm Forward - 2-3 x daily - 7 x weekly - 2-3 sets - 10 reps Sidelying Shoulder External Rotation - 2-3 x daily - 7 x weekly - 2-3 sets - 10 reps Standing Row with Anchored Resistance - 2-3 x daily - 7 x weekly - 2-3 sets - 10 reps Supine Scapular Protraction in Flexion with Dumbbells - 2 x daily - 7 x weekly - 1 sets - 20 reps - 3 seconds hold Supine Shoulder Internal Rotation Stretch - 2 x daily - 7 x weekly - 1 sets - 10-20 reps - 10 seconds hold Supine Shoulder External Rotation Stretch - 2 x daily - 7 x weekly - 1 sets - 10-20 reps - 10 seconds hold Standing Shoulder Flexion to 90 Degrees with Dumbbells - 1 x daily - 7 x weekly - 3 sets - 10 reps Shoulder Abduction with Dumbbells - Thumbs Up - 1 x daily - 7 x weekly - 3 sets - 10 reps Standing Shoulder Horizontal Abduction with Dumbbells - Thumbs Up - 1 x daily - 7 x weekly - 3 sets - 10 reps

## 2021-02-04 NOTE — Therapy (Signed)
[facility] Physical Therapy [address] [place], [place], [address] Phone: [phone]   Fax:  [phone]  Physical Therapy Treatment/Discharge Summary  Patient Details  Name: Erik Perkins MRN: [id number] Date of Birth: February 09, 1961 Referring Provider (PT): Meredith Pel, MD   Encounter Date: 02/04/2021   PT End of Session - 02/04/21 1533     Visit Number 19    Authorization Type VA    Authorization Time Period 06/18/20- 02/25/2021 - new 15 visits approved starting 7/13    Authorization - Visit Number 9    Authorization - Number of Visits 15    PT Start Time 6720    PT Stop Time 9470    PT Time Calculation (min) 25 min    Activity Tolerance Patient tolerated treatment well;No increased pain    Behavior During Therapy [facility] for tasks assessed/performed             Past Medical History:  Diagnosis Date   Coronary artery disease    a. 01/2010 NSTEMI/PCI: Mild diff LDA/LCX dzs, moderate ostial D2/3 dzs. RCA 60-70p, 99d (3.5x12 Xience DES).   GERD (gastroesophageal reflux disease)    Hyperlipidemia    Hypertension    MI (myocardial infarction) ([place])    10/2004, 01/2010    Past Surgical History:  Procedure Laterality Date   CARDIAC CATHETERIZATION     FRACTURE SURGERY Right    wrist  60yo   SHOULDER ARTHROSCOPY WITH OPEN ROTATOR CUFF REPAIR AND DISTAL CLAVICLE ACROMINECTOMY Left 10/23/2020   Procedure: LEFT SHOULDER ARTHROSCOPY, DEBRIDEMENT, MINI OPEN ROTATOR CUFF TEAR REPAIR, BICEPS TENODESIS;  Surgeon: Meredith Pel, MD;  Location: [place];  Service: Orthopedics;  Laterality: Left;   VASECTOMY  1984    There were no vitals filed for this visit.   Subjective Assessment - 02/04/21 1509     Subjective feels like he's hit the end of the road with PT, thinks he can wrap up today    Pertinent History HTN, stented coronary artery, CAD, MI    Limitations Lifting    Patient Stated Goals Return to work, reach hand above head    Currently in Pain?  No/denies    Pain Onset More than a month ago                [facility] PT Assessment - 02/04/21 1517       Assessment   Medical Diagnosis Z98.890 (ICD-10-CM) - Status post rotator cuff repair    Referring Provider (PT) Meredith Pel, MD    Onset Date/Surgical Date 10/23/20    Hand Dominance Right      Observation/Other Assessments   Focus on Therapeutic Outcomes (FOTO)  75      AROM   Left Shoulder Flexion 145 Degrees    Left Shoulder ABduction 146 Degrees    Left Shoulder Internal Rotation 90 Degrees   FIR to Lt QL (equal to Rt)   Left Shoulder External Rotation --   FER to T2     Strength   Left Shoulder Flexion --   30 lbs tested in sitting c pressure applied just proximal to elbow.  16 lbs in standard MMT pressure application   Left Shoulder ABduction --   31.2, 31.9 = 31.55# avg                          [facility] Adult PT Treatment/Exercise - 02/04/21 1510       Self-Care   Self-Care Other Self-Care  Comments    Other Self-Care Comments  discussed current HEP and updated to include more advanced strengthening - pt verbalized knowledge of all exercises.      Shoulder Exercises: Pulleys   Flexion 1 minute    Scaption 1 minute      Shoulder Exercises: ROM/Strengthening   UBE (Upper Arm Bike) L5 x 8 min (alt 2' fwd/bwd)                    PT Education - 02/04/21 1533     Education Details HEP    Person(s) Educated Patient    Methods Explanation;Demonstration;Handout    Comprehension Verbalized understanding;Returned demonstration;Need further instruction              PT Short Term Goals - 12/13/20 1036       PT SHORT TERM GOAL #1   Title independent with HEP    Time 4    Period Weeks    Status Achieved    Target Date 12/17/20      PT SHORT TERM GOAL #2   Title Lt shoulder PROM improved by 15 deg flex, abduction, and er for improved function    Time 4    Period Weeks    Status Achieved    Target Date 12/17/20                PT Long Term Goals - 02/04/21 1533       PT LONG TERM GOAL #1   Title independent with final HEP    Time 6    Period Weeks    Status Achieved      PT LONG TERM GOAL #2   Title improve Lt shoulder AROM to [facility] for improved function and mobility    Time 6    Period Weeks    Status Achieved      PT LONG TERM GOAL #3   Title demonstrate at least 4/5 Lt shoulder strength for improved function    Time 6    Period Weeks    Status Achieved      PT LONG TERM GOAL #4   Title FOTO score improved to 66 for improved function    Time 6    Period Weeks    Status Achieved      PT LONG TERM GOAL #5   Title report pain < 3/10 with activity for improved function    Time 6    Period Weeks    Status Achieved                   Plan - 02/04/21 1534     Clinical Impression Statement Pt has met all goals at this time and is ready for d/c from PT.  Continued difficulty with residual stiffness which improves with quick stretching exercises.  Anticipate this will improve with continued exercise and time.  Will d/c PT today.    Personal Factors and Comorbidities Comorbidity 3+;Profession    Comorbidities HTN, stented coronary artery, CAD, MI    Examination-Activity Limitations Carry;Lift;Reach Overhead    Examination-Participation Restrictions Cleaning;Community Activity;Driving;Yard Work;Occupation    Stability/Clinical Decision Making Stable/Uncomplicated    Rehab Potential Good    PT Frequency 2x / week    PT Duration 6 weeks    PT Treatment/Interventions ADLs/Self Care Home Management;Aquatic Therapy;Cryotherapy;Electrical Stimulation;Moist Heat;Therapeutic exercise;Therapeutic activities;Functional mobility training;Ultrasound;Neuromuscular re-education;Patient/family education;Manual techniques;Vasopneumatic Device;Taping;Dry needling;Passive range of motion;Scar mobilization    PT Next Visit Plan d/c PT today    PT Home  Exercise Plan Access Code: [email or web address]    Consulted  and Agree with Plan of Care Patient             Patient will benefit from skilled therapeutic intervention in order to improve the following deficits and impairments:  Pain, Impaired UE functional use, Decreased strength, Decreased range of motion, Impaired flexibility, Postural dysfunction, Increased edema  Visit Diagnosis: Abnormal posture  Muscle weakness (generalized)  Stiffness of left shoulder, not elsewhere classified  Acute pain of left shoulder  Localized edema  Chronic pain in left shoulder     Problem List Patient Active Problem List   Diagnosis Date Noted   Biceps tendonitis, left    Traumatic complete tear of left rotator cuff    Atherosclerosis of native coronary artery of native heart with stable angina pectoris ([place]) 01/05/2017   Essential hypertension 01/05/2017   Stented coronary artery 08/08/2011   Smoking hx 08/08/2011   Hyperlipidemia 01/30/2010   Coronary atherosclerosis 01/30/2010      Laureen Abrahams, PT, DPT 02/04/21 3:36 PM    [facility] [address] [place], [place], [address] Phone: [phone]   Fax:  [phone]  Name: Erik Perkins MRN: [id number] Date of Birth: 05-22-1961   PHYSICAL THERAPY DISCHARGE SUMMARY  Visits from Start of Care: 19  Current functional level related to goals / functional outcomes: See above   Remaining deficits: See above   Education / Equipment: HEP   Patient agrees to discharge. Patient goals were met. Patient is being discharged due to meeting the stated rehab goals.  Laureen Abrahams, PT, DPT 02/04/21 3:36 PM  [facility] [address] [place], [place], [address] Phone: [phone]   Fax:  [phone]

## 2021-02-07 ENCOUNTER — Encounter: Payer: No Typology Code available for payment source | Admitting: Physical Therapy

## 2021-02-12 ENCOUNTER — Ambulatory Visit: Admit: 2021-02-12 | Discharge: 2021-02-12 | Payer: MEDICARE | Attending: Nurse Practitioner | Primary: Internal Medicine

## 2021-02-12 ENCOUNTER — Other Ambulatory Visit: Payer: Self-pay | Admitting: Cardiovascular Disease

## 2021-02-12 DIAGNOSIS — G959 Disease of spinal cord, unspecified: Secondary | ICD-10-CM

## 2021-02-12 NOTE — Progress Notes (Signed)
[address]  [place], [place] [address]  [phone]    Subjective:  Patient was last seen on 11/06/2020 (Lumbar) at which time Massage Therapy was ordered.    Chief Complaints:   Chief Complaint   Patient presents with    Neck Pain     Middle of neck pain. 3/10    Back Pain     Low Back pain. 5/10. Balance is off.     Patient presents today with complaints of low back pain, neck pain. Balance is off  Patient rates pain 3/10 (neck), 5/10 (low back) on the pain scale today.      HPI:  Jesse Savage is a 60 year old Caucasian male who is well-known to me and returns to the office today in follow-up after last being seen on 11/06/2020.  He was previously seen for ongoing right-sided buttock and right lower extremity pain which is a chronic problem.  He has had a prior ACDF at C5-6 with Dr. Alcide Goodness on 08/26/2019 for cervical myelopathy.  He tells me today, that he presented to the emergency department at [facility] on 02/05/2021 with acute onset dizziness and gait instability.  He reports feeling like the room was spinning.  A CT scan of the cervical spine was completed at that time and is available for review today.  He was also prescribed a 6-day prednisone taper which he has completed and reports getting some relief from his buttock and leg pain.  He has remained myelopathic with multiple ongoing complaints since his cervical decompression including decreased libido, gait instability which he reports has recently gotten slightly worse and and uncontrolled "shaking" of his left leg when he stretches first thing in the morning.      Current Medications:    Current Outpatient Medications:     docusate (COLACE, DULCOLAX) 100 MG CAPS,  1 tabs, Oral, BID, # 60 tabs, 1 Refill(s), Pharmacy: [facility], 1 tabs Oral BID, 177, cm, 09/02/19 10:11:00 EDT, Height/Length Measured, 77, kg, 09/02/19 10:11:00 EDT, Weight Dosing, Disp: , Rfl:     diazePAM (VALIUM) 10 MG tablet, TAKE 1 TABLET BY MOUTH 3 TIMES A  DAY AS NEEDED DIZZINESS, Disp: , Rfl:     cyclobenzaprine (FLEXERIL) 10 MG tablet, , Disp: , Rfl:     amitriptyline (ELAVIL) 25 MG tablet, 1 tablet Orally Once a day, Disp: , Rfl:     aspirin 81 MG chewable tablet, 1 tablet Orally Once a day, Disp: , Rfl:     dexamethasone (DECADRON) 4 MG tablet, 1 tablet Orally Once a day for 30 day(s), Disp: , Rfl:     DULoxetine (CYMBALTA) 60 MG extended release capsule, 1 capsule, Disp: , Rfl:     famotidine (PEPCID) 20 MG tablet, 1 tablet at bedtime as needed Orally Once a day for 30 day(s), Disp: , Rfl:     ibuprofen (ADVIL;MOTRIN) 200 MG tablet, 1 tablet with food or milk as needed Orally Three times a day, Disp: , Rfl:     ibuprofen (ADVIL;MOTRIN) 800 MG tablet, TAKE 1 TABLET BY MOUTH TWICE A DAY AS NEEDED FOR 30 DAYS, Disp: , Rfl:     LORazepam (ATIVAN) 0.5 MG tablet, 1 tablet Orally at bedtime as needed, Disp: , Rfl:     LORazepam (ATIVAN) 2 MG tablet, TAKE 1 TABLET BY MOUTH AT BEDTIME AS NEEDED for 30, Disp: , Rfl:     oxyCODONE-acetaminophen (PERCOCET) 7.5-325 MG per tablet, 1 tablet as needed Orally every 6 hours for 5 days, Disp: ,  Rfl:     Medical History:  Past Medical History:   Diagnosis Date    Back pain     Lumbar disc disease     OSA (obstructive sleep apnea)     Sciatica        Allergies/Intolerance:  Allergies   Allergen Reactions    Codeine      Other reaction(s): itch    Dilaudid [Hydromorphone Hcl]      Other reaction(s): malaise    Duloxetine Hcl      Other reaction(s): hypertension    Gabapentin      Other reaction(s): sleepy  Other reaction(s): dizzy    Hydrocodone      Other reaction(s): itch    Pregabalin      Other reaction(s): neurotic episodes    Tramadol Hcl      Other reaction(s): malaise        Surgical History:  Past Surgical History:   Procedure Laterality Date    BACK SURGERY  06/2010    L5-S1 REMOVAL OF SCREWS    CERVICAL SPINE SURGERY  08/26/2019    C5-C6 ACDF    FACIAL SURGERY  1981    HERNIA REPAIR  2002    LUMBAR FUSION  04/22/2011     L4-L5 PLIF USING DePUY SPACER, BMP, AUTOGRAFT, LATERAL FUSION USING SYNTHES SCREWS    THUMB ARTHROSCOPY  1995    TONSILLECTOMY AND ADENOIDECTOMY  1972        Family History:  Family History   Problem Relation Age of Onset    Heart Failure Father     Diabetes Father     Diabetes Paternal Grandmother     Heart Failure Paternal Grandfather        Social History:  Social History       Tobacco History       Smoking Status  Never      Smokeless Tobacco Use  Never              Alcohol History       Alcohol Use Status  Never              Drug Use       Drug Use Status  Never              Sexual Activity       Sexually Active  Not Asked                     Objective:  Ht 5\' 8"  (1.727 m)    Wt 169 lb 9.6 oz (76.9 kg)    BMI 25.79 kg/m??   Body mass index is 25.79 kg/m??.      General Examination:  GENERAL APPEARANCE: pleasant, well nourished, well developed; in no acute distress  GCS 15.  HEENT:  atraumatic, normocephalic  Cardiac:  regular rate and rhythm; normal S1, S2  Respiratory:  clear to auscultation bilaterally     Lumbar Spine/Lower back:  INSPECTION: No abnormal curvature of spine. No muscular atrophy.   PALPATION: No palpable abnormality. No paraspinal tenderness. No paravertebral spasm. No vertebral spine tenderness.   SENSORY EXAM: Sensory is grossly normal to light touch throughout.   GAIT: Within normal limits; slow tandem walk  LOWER EXTREMITY TONE Normal tone bilaterally.     Motor Strength:  Iliopsoas bilateral 5/5.   Quadriceps bilateral 5/5.   Hamstrings bilateral 5/5  Tibialis anterior bilateral 5/5.   Extensor Hallicus Longus bilateral 5/5.   Gastrocnemius left  4/5, right 5/5.     Examination:   Motor Strength:  Deltoid bilateral 5/5.   Biceps bilateral 5/5.   Triceps bilateral 5/5.   External rotators bilateral 5/5   Wrist extensor bilateral 5/5.   Finger extension bilateral 5/5.   Grip bilateral 5/5.   Interossii bilateral 5/5.   Abductor pollicus brevis bilateral 5/5.   Opponens pollicis bilateral 5/5.      Reflexes:  Biceps right 3+, left 4+  Triceps Bilateral 2+.   Radial right 2+, left 3+  Hoffman's sign: present bilaterally, left greater than right  Ankle clonus:  sustained on the left, 2-3 beats on the right    Cervical Spine/Neck:  C SPINE EXAM: No palpable abnormality, no paraspinal tenderness, no paravertebral spasm, no percussion tenderness. Normal flexion, extension, and rotation. No L'Hermittes, no spurlings.      RADIOGRAPHS   CT scan of the cervical spine: 02/05/2021, completed at [facility]  There is no acute fracture or traumatic subluxation of the cervical spine.  Status post ACDF C5-6 without evidence of hardware complication.  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         Assessment/Plan:  Problem List Items Addressed This Visit    None  Visit Diagnoses       Cervical myelopathy (HCC)    -  Primary             I have personally reviewed the most recent CT scan of the cervical spine completed at [facility] as well as the most recent cervical MRI from April 2022.  There does not appear to be any significant worsening of canal stenosis at C5-6 secondary to a large posterior disc osteophyte, however he continues to be symptomatic from this and has gotten slightly worse over time.  He continues to have evidence of long track findings on physical exam.  I have also reviewed the images today with Dr. Alcide Goodness and he has seen the patient during today's visit as well.  Given the slightly worsening of his symptoms as well as continued myelopathic findings, he will likely require further surgical intervention to decompress the cervical spine at this level in the form of a posterior cervical decompression.  Dr. Alcide Goodness would like to review the case with some of his colleagues before making a final decision and we will have the patient return to  the office along with his wife to further discuss surgery.  He also continues to be troubled by chronic lumbar back pain for which he has gotten several epidural steroid injections as well as outpatient physical therapy.  He will likely require surgical intervention for this at some point, however the cervical myelopathy will take precedent.  Dr. Alcide Goodness has discussed this in detail today with the patient and he verbalizes understanding.    Follow Up:  Return for tbd.          Electronically signed by Amado Nash, APRN - NP on 02/12/2021 at 4:16 PM

## 2021-02-13 NOTE — Telephone Encounter (Signed)
Patient needs an appointment with Dr. Alcide Goodness. LVM for patient to return my call.

## 2021-02-18 NOTE — Telephone Encounter (Signed)
Patient is scheduled for 02/20/2021

## 2021-02-20 ENCOUNTER — Ambulatory Visit: Admit: 2021-02-20 | Discharge: 2021-02-20 | Payer: MEDICARE | Attending: Specialist | Primary: Internal Medicine

## 2021-02-20 DIAGNOSIS — Z981 Arthrodesis status: Secondary | ICD-10-CM

## 2021-02-20 NOTE — Progress Notes (Signed)
Patient was last seen on 02/12/2021 at which time surgery was discussed.   Patient presents today with reports of continued middle of neck pain. Low back pain is 5/10, when standing. Balance is off.

## 2021-02-20 NOTE — Progress Notes (Signed)
Jesse Savage (DOB:  08-17-1960) is a 60 y.o. male,Established patient, here for evaluation of the following chief complaint(s):  Neck Pain and Back Pain (Low back pain. Balance is off. )         ASSESSMENT/PLAN:  Cervical myelopathy    Status post cervical spinal fusion  As noted previously in the last encounter we believe that the patient is still myelopathic and possibly probably progressively myelopathic for continued spinal cord compression.  He has a great deal of calcification in the posterior longitudinal ligament with ongoing compression at the C5-6 level.  I think going anteriorly is clearly unsatisfactory at this point there are calcifications likely stuck to the dura.  I think he requires posterior decompression at the 5 6 level good wide long laminectomy to give the spinal cord sufficient room.  I have reviewed this case with Dr. Raynelle Highland.  I have today in the office explained to the patient and his wife in great detail the nature of the problem, the nature of the procedure, the expected results, and the risks and complications including the possibility of no improvement, worsening myelopathy, major neurologic deficit, infection, hemorrhage, CSF leak, etc. today's visit involves lengthy counseling.  Patient has an acute on chronic and complex problem, I have reviewed and interpreted his diagnostic imaging again and we are recommending major surgical intervention.         Subjective   SUBJECTIVE/OBJECTIVE:  HPI    See previous note    Jesse Savage is a 60 year old Caucasian male who is well-known to me and returns to the office today in follow-up after last being seen on 11/06/2020.  He was previously seen for ongoing right-sided buttock and right lower extremity pain which is a chronic problem.  He has had a prior ACDF at C5-6 with Dr. Alcide Goodness on 08/26/2019 for cervical myelopathy.  He tells me today, that he presented to the emergency department at [facility] on 02/05/2021 with acute onset  dizziness and gait instability.  He reports feeling like the room was spinning.  A CT scan of the cervical spine was completed at that time and is available for review today.  He was also prescribed a 6-day prednisone taper which he has completed and reports getting some relief from his buttock and leg pain.  He has remained myelopathic with multiple ongoing complaints since his cervical decompression including decreased libido, gait instability which he reports has recently gotten slightly worse and and uncontrolled "shaking" of his left leg when he stretches first thing in the morning.       Review of Systems       Objective   Physical Exam       On this date 02/20/2021 I have spent 61 minutes reviewing previous notes, test results and face to face with the patient discussing the diagnosis and importance of compliance with the treatment plan as well as documenting on the day of the visit.      An electronic signature was used to authenticate this note.    --Ward Neil Crouch, MD

## 2021-02-28 ENCOUNTER — Telehealth

## 2021-02-28 NOTE — Telephone Encounter (Signed)
[facility]: NEUROSURGERY  Fax [phone]      Patient Name: Jesse Savage   DOB: 1960-08-25   Age: 60 y.o.   Sex: male  Patient ID: _0 @   Preferred Phone: [phone]     ______________________________________________________________________    Surgery:  Date: 04/01/21  Time:  0800 Duration: 3 hours  Facility: [facility]    Admitting Diagnosis: cervical myelopathy     Diagnosis Codes: G95.9  Film Location:  PACS    Status: _1   IP      _2  OP     _3  Same Day Discharge is Planned           _4  Elective  _5  Urgent               Perioperative Clinic Visit Needed: _6  Yes _7  No    Procedure Codes:  _8  20552 _9  22558* _10  22830* _11  22856 _12  67893 _13  81017*  _14  20610 _15  22585 _16  22840 _17  51025 _18  62223* _19  85277*  _20  20930 _21  22600* _22  22842 _23  22899 _24  62252 _25  63267  _26  20931 _27  22610* _28  22843* _29  61154* _30  63012 _31  63276*  _32  20936 _33  22612 _34  22845 _35  61312* _36  63030 _37  63655  _38  22513 _39  22614 _40  22846* _41  61343* _42  63042 _43  82423  _44  22514 _45  22630 _46  22849* _47  61510* _48  63045 _49  53614  _50  22551 _51  43154* _52  22852* _53  61512* _54  63047 _55  00867  _56  22552 _57  61950 _58  22853 _59  93267* _60  12458 _61  09983  _62  22554 _63  38250 _64  53976 _65  73419 _66  37902 _67  40973      _68  53299        *Medicare IP only Code    _69  ERAS      Operative Consent for:  Posterior cervical laminectomy C5-6    MD Performing:  Marlou Porch   MD NPI Number:   Assistant: Rosana Hoes   Assistant NPI Number:        Allergies:   Allergies   Allergen Reactions    Codeine      Other reaction(s): itch  Other reaction(s): NA    Dilaudid [Hydromorphone Hcl]      Other reaction(s): malaise    Duloxetine Hcl      Other reaction(s): hypertension  Other reaction(s): NA    Gabapentin      Other reaction(s): sleepy  Other reaction(s): dizzy  Other reaction(s): NA    Hydrocodone      Other reaction(s): itch    Pregabalin      Other reaction(s): neurotic episodes    Tramadol Hcl      Other reaction(s): malaise      ______________________________________________________________________    Physician Name:     Primary Insurance Carrier: _70 @ Member Number: _71 @    Member Name:  Member DOB:     Secondary Insurance Carrier:  Member Number:     Member Name:  Member DOB:   ______________________________________________________________________    Anesthesia Type: _72  General _73  MAC _74  Neuroaxial  _75  Local Only    Height:   Weight: _76 @   ______________________________________________________________________                  [place]:  _77 Atlantis   _78  Legacy   _79  Divergence   _80  Wave   _81  Sextant _82  Solera       _83  Elevate   _84   Spine   _85 Infinity   _86  ILOF/DLIF   _87  Zevo   _88   Voyager       _0  Titan   _1  Titan C  _2  AESCULAP ABC       _3  SPINEOLOGY Elite   20930= What type of graft material:    Other:        Equipment: _4 C-Arm _5  O-Arm _6 Siemans C-Arm _7  (MIS only)   _8  (OPEN/MIS combo)   _9  Microscope  _10  Cellsaver  _11  Nerve Monitoring   _12  IGS Neuro Navigation  _13  Other:    Headrest: _14  3 Point   _15  Horseshoe  _16  Other:   Table: _17  Jackson   _18  Trumpf   _19  Radiolucent  _20  Other:     Removal of Hardware:     Patient Positioning: _21  Supine  _22  Prone _23  Lateral Right Side Up   _24  Lateral Left Side Up  _25  Beanbag   Foley: _26  Cath  _27  Other:     MD Position: _28  On Patient's Right _29  On Patient's Left  _30  Other:     DME: _31 [facility] DME  _32 External DME   _33 [place] Overlap Brace  _34 TLSO L0456     _35 TLSO [id number]     _36 LSO Custom Brace [id number]   _37 [place] J P3825   _38 Vista Multi Post Collar L0180   _39 Bone Growth Stimulator-Spinal K5397    _40 LSO Custom [id number]   <[id number]>_5<\/HGDJMEQASTMHDQQI>_29 Other:    I certify that this prescribed equipment, its setup and any related patient education is medically indicated & both reasonable and necessary to accepted standards of medicine of the pt's condition. I certify that the information provided is true, accurate and complete to the best of my knowledge. I understand that any falsification, omission or  concealment of material fact associated with billing this service may subject me to civil or criminal liability.   ______________________________________________________________________      Kyla Balzarine   Equipment: _42 Selector  _43 STEALTH _44 Skull Clamps _45 Navigation _46 Microscope   _47 Cellsaver  _48 Subdural Drain _49 Biopsy _50 FS/permanent _51 Ultrasound   _52 Jelly doughnut _53 Other:   Headrest: _54 Sugita _55 3 Point Pins _56 Horseshoe _57 Other:  Table:   Shunts: _58 Codman Shunt  _59 Medtronic  _60 Foley Cath  _61 Other:     Patient Positioning: _62 Supine _63 Lateral Right Side Up _64 Lateral Left Side Up   _65 Beanbag _66 Prone _67 Other:     MD Position: _68 On Patient's Right  _69 On Patient's Left  _70 Other:     Physician Signature:  Newman Nip, APRN - NP         Date:  02/28/21  Time: 9:19 AM      ______________________________________________________________________    Patient Name: Franco Nones   DOB: 1960/11/25   Age: 60 y.o.   Sex: male  Patient ID: [id number]  Preferred Phone: [phone]   SSN: _71 @

## 2021-03-21 LAB — CBC
Hematocrit: 44.5 % (ref 38.0–52.0)
Hemoglobin: 14.7 g/dL (ref 13.0–17.3)
MCH: 29.5 pg (ref 27.0–34.5)
MCHC: 33 g/dL (ref 32.0–36.0)
MCV: 89.4 fL (ref 84.0–100.0)
MPV: 9.8 fL (ref 7.2–13.2)
NRBC Absolute: 0 10*3/uL (ref 0.000–0.012)
NRBC Automated: 0 % (ref 0.0–0.2)
Platelets: 285 10*3/uL (ref 140–440)
RBC: 4.98 x10e6/mcL (ref 4.00–5.60)
RDW: 12.4 % (ref 11.0–16.0)
WBC: 5.1 10*3/uL (ref 3.8–10.6)

## 2021-03-21 NOTE — Anesthesia Pre-Procedure Evaluation (Signed)
Preop clinic        Patient:   Jesse Savage, Jesse Savage            MRN: [id number]            FIN: [id number]               Age:   60 years     Sex:  Male     DOB:  [birth year]   Associated Diagnoses:   None   Author:   Demetrius Revel L-FNP      Preoperative Information   Procedure/ Case: cervical laminectomy   Surgeon scheduled: WORTHINGTON,  CURTIS-MD   Anesthesia history     Patient's history: negative.     Family's history: negative.        History of Present Illness   C5/6 laminectomy on 10/24      Review of Systems   General:  able to do chores and ADLs, can do yardwork.    Metabolic Equivalents in Exercise Testing:  4-7.    Respiratory:       Sleep apnea: Unable to tolerate CPAP device.    Cardiovascular:  Negative.    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    Musculoskeletal:       Back pain: In the upper region, The pain is moderate.       Health Status   Allergies:    Allergic Reactions (All)  Unknown  Codeine- Na.  Cymbalta- Na.  Neurontin- Na.   Current medications:    Home Medications (4) Active  diazepam 10 mg oral tablet 10 mg = 1 tabs, PRN, Oral, TID  Dulcolax Stool Softener 100 mg oral capsule 100 mg = 1 caps, PRN, Oral, BID  Flexeril , Oral, TID  Percocet 5/325 oral tablet 2 tabs, PRN, Oral, q4hr  ,    No qualifying data available     Problem list:    Active Problems (4)  Acute myelopathy   Back pain   OSA (obstructive sleep apnea)   Sciatica         Histories   Past Medical History:    No active or resolved past medical history items have been selected or recorded.   Procedure history:    Lumbar Epidural Steroid Injection on 07/03/2020 at 59 Years.  Comments:  07/03/2020 13:59 EST - [name], RN, [name] A  auto-populated from documented surgical case  Si Joint Injection (Bilateral) on 07/03/2020 at 59 Years.  Comments:  07/03/2020 13:59 EST - [name], RN, [name] A  auto-populated from documented surgical case  Transforaminal Epidural Ster Inj on  05/31/2020 at 59 Years.  Comments:  05/31/2020 11:13 EST - PYLE, RN, JOYCE  auto-populated from documented surgical case  Si Joint Injection on 05/31/2020 at 59 Years.  Comments:  05/31/2020 11:13 EST - PYLE, RN, JOYCE  auto-populated from documented surgical case  Anterior Cervical Discectomy with Fusion and/or Stabilization SCIP on 08/26/2019 at 58 Years.  Comments:  08/26/2019 11:38 EDT - [name], RN, [name]  auto-populated from documented surgical case  Transforaminal Epidural Ster Inj on 06/29/2019 at 58 Years.  Comments:  06/29/2019 11:34 EST - [name], RN, [name] A  auto-populated from documented surgical case  Lumbar fusion L4-5 on 04/22/2011 at 50 Years.  redo lumbar fusion L5-S1 in 2012 at 50 Years.  L5-S1 lumbar fusion in 2005 at 43 Years.  thumb surgery in 1995 at 33 Years.  facial surgery in  1981 at 19 Years.  tonsils and adenoids in 1972 at 10 Years.  Hernia ([id number]).  Tonsillectomy ([id number]).  colonoscopy.   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        Physical Examination      Vital Signs (last 24 hrs)_____  Last Charted___________  Heart Rate Peripheral   78 bpm  (OCT 13 10:32)  SBP      122 mmHg  (OCT 13 10:32)  DBP      80 mmHg  (OCT 13 10:32)  SpO2      98 %  (OCT 13 10:32)  Weight      76.3 kg  (OCT 13 10:32)  Height      172 cm  (OCT 13 10:32)  BMI      25.79  (OCT 13 10:52)     General:          Stress: No acute distress.         Appearance: Within normal limits.    Airway:          Mallampati classification: III (soft palate, base of uvula visible).         Mouth: Teeth ( poor dentition, temporary bridge in place ).    Respiratory:  Lungs are clear to auscultation.     Cardiovascular:  Regular rhythm.    Neurologic:  Alert, Oriented.       Review / Management   Results review:     No qualifying data available, Lab results   03/21/2021 11:32 EDT WBC 5.1 x10e3/mcL    RBC 4.98 x10e6/mcL    Hgb 14.7 g/dL    HCT 18.2 %    MCV 99.3 fL    MCH 29.5 pg    MCHC 33.0 g/dL    RDW 71.6 %    Platelet 285 x10e3/mcL    MPV 9.8 fL    NRBC Absolute Auto 0.000 x10e3/mcL    NRBC Percent Auto 0.0 %   , patient is optimized for surgery.Education officer, community Signed on 03/22/2021 08:29 AM EDT   ________________________________________________   Demetrius Revel L-FNP               Modified by: Demetrius Revel L-FNP on 03/21/2021 10:55 AM EDT      Modified by: Demetrius Revel L-FNP on 03/21/2021 11:04 AM EDT      Modified by: Demetrius Revel L-FNP on 03/22/2021 08:29 AM EDT

## 2021-03-21 NOTE — Nursing Note (Signed)
 Adult Admission Assessment - Text       Perioperative Admission Assessment Entered On:  03/21/2021 9:40 EDT    Performed On:  03/21/2021 9:39 EDT by CLAUDENE CHARMAINE BRAVO               General   PAT Patient Procedure Verification :   Patient name and DOB confirmed with patient, Correct procedure scheduled confirmed with patient, Correct side/site confirmed with patient   Jesse Savage - 04/01/2021 7:33 EDT   SN - Preop Clinic Visit :   Preop Clinic Visit   Information Given By :   Self   Primary Care Physician/Specialists :   PCP - DR. CLARE   Day of Proc Supp Prsn is the Emerg Cont :   No   Day of Procedure Support Person Name :   Jesse Savage   Day of Procedure Support Person Phone :   [phone]   Day of Procedure Support Person Relationship :   WIFE   PAT Patient/Procedure Verification :   Jesse Savage   Emergency Contact Phone :   [phone]   Languages :   Isadora   Preferred Communication Mode :   Verbal, Written   CLAUDENE,  CHARMAINE E - 03/21/2021 9:39 EDT   Allergies   (As Of: 04/01/2021 07:49:04 EDT)   Allergies (Active)   codeine  Estimated Onset Date:   Unspecified ; Reactions:   NA ; Created By:   CLARISE LOT D; Reaction Status:   Active ; Category:   Drug ; Substance:   codeine ; Type:   Allergy ; Severity:   Unknown ; Updated By:   CLARISE LOT D; Reviewed Date:   04/01/2021 7:45 EDT      Cymbalta  Estimated Onset Date:   Unspecified ; Reactions:   NA ; Created By:   CLARISE LOT D; Reaction Status:   Active ; Category:   Drug ; Substance:   Cymbalta ; Type:   Allergy ; Severity:   Unknown ; Updated By:   CLARISE LOT D; Reviewed Date:   04/01/2021 7:45 EDT      Neurontin  Estimated Onset Date:   Unspecified ; Reactions:   NA ; Created By:   CLARISE LOT D; Reaction Status:   Active ; Category:   Drug ; Substance:   Neurontin ; Type:   Allergy ; Severity:   Unknown ; Updated By:   MCMURRAY,  NATHAN D; Reviewed Date:   04/01/2021 7:45 EDT        Medication History    Medication List   (As Of: 04/01/2021 07:49:04 EDT)   Normal Order    Lactated Ringers  Injection solution 1000 mL  :   Lactated Ringers  Injection solution 1000 mL ; Status:   Ordered ; Ordered As Mnemonic:   Lactated Ringers  Injection 1000 mL ; Simple Display Line:   40 mL/hr, IV ; Ordering Provider:   NICHOLAUS SUZEN ASKEW; Catalog Code:   Lactated Ringers  Injection ; Order Dt/Tm:   03/04/2021 10:18:36 EDT ; Comment:   Perioperative use ONLY  For Non Dialysis Patient          Sodium Chloride  0.9% intravenous solution 1000 mL  :   Sodium Chloride  0.9% intravenous solution 1000 mL ; Status:   Ordered ; Ordered As Mnemonic:   Sodium Chloride  0.9% 1000 mL ; Simple Display Line:   40 mL/hr, IV ; Ordering Provider:   NICHOLAUS SUZEN ASKEW; Catalog Code:  Sodium Chloride  0.9% ; Order Dt/Tm:   03/04/2021 10:18:36 EDT ; Comment:   Perioperative use ONLY          lidocaine  1% PF Inj Soln 2 mL  :   lidocaine  1% PF Inj Soln 2 mL ; Status:   Ordered ; Ordered As Mnemonic:   lidocaine  1% preservative-free injectable solution ; Simple Display Line:   0.25 mL, ID, q5min, PRN: other (see comment) ; Ordering Provider:   RODOLFO NEST L-FNP; Catalog Code:   lidocaine  ; Order Dt/Tm:   04/01/2021 07:20:17 EDT ; Comment:   to access lidocaine  1%  2 mL vial for IV start and Life Port access          sodium chloride  0.9% Inj Soln 10 mL syringe  :   sodium chloride  0.9% Inj Soln 10 mL syringe ; Status:   Ordered ; Ordered As Mnemonic:   10 mL sodium chloride  0.9% flush syringe range dose ; Simple Display Line:   30 mL, IV Push, q5min, PRN: other (see comment) ; Ordering Provider:   RODOLFO NEST L-FNP; Catalog Code:   sodium chloride  flush ; Order Dt/Tm:   04/01/2021 07:20:17 EDT          ceFAZolin  2 g/100 ml NS  :   ceFAZolin  2 g/100 ml NS ; Status:   Ordered ; Ordered As Mnemonic:   ceFAZolin  IVPB ; Simple Display Line:   2 g, 100 mL, 200 mL/hr, IV Piggyback, On Call ; Ordering Provider:   NICHOLAUS SUZEN ASKEW; Catalog Code:    ceFAZolin  ; Order Dt/Tm:   03/04/2021 10:18:37 EDT            Home Meds    docusate  :   docusate ; Status:   Documented ; Ordered As Mnemonic:   Dulcolax Stool Softener 100 mg oral capsule ; Simple Display Line:   100 mg, 1 caps, Oral, BID, PRN: for constipation, 20 caps, 0 Refill(s) ; Catalog Code:   docusate ; Order Dt/Tm:   03/21/2021 10:53:57 EDT          diazepam   :   diazepam  ; Status:   Documented ; Ordered As Mnemonic:   diazepam  10 mg oral tablet ; Simple Display Line:   10 mg, 1 tabs, Oral, TID, PRN: for anxiety, 0 Refill(s) ; Catalog Code:   diazepam  ; Order Dt/Tm:   03/19/2021 11:28:27 EDT          cyclobenzaprine  :   cyclobenzaprine ; Status:   Documented ; Ordered As Mnemonic:   Flexeril ; Simple Display Line:   Oral, TID, 0 Refill(s) ; Catalog Code:   cyclobenzaprine ; Order Dt/Tm:   07/03/2020 13:36:37 EST          acetaminophen -oxyCODONE   :   acetaminophen -oxyCODONE  ; Status:   Documented ; Ordered As Mnemonic:   Percocet  5/325 oral tablet ; Simple Display Line:   2 tabs, Oral, q4hr, PRN: severe pain (8-10), 0 Refill(s) ; Ordering Provider:   DONOVAN,  JAYNE M-MD; Catalog Code:   acetaminophen -oxyCODONE  ; Order Dt/Tm:   09/06/2019 13:56:57 EDT            Problem History   (As Of: 04/01/2021 07:49:04 EDT)   Problems(Active)    Acute myelopathy (SNOMED CT  :8784240981 )  Name of Problem:   Acute myelopathy ; Recorder:   LEWEY,  JENNIFER L-FNP; Confirmation:   Confirmed ; Classification:   Medical ; Code:   8784240981 ; Contributor System:  PowerChart ; Last Updated:   08/24/2019 9:04 EDT ; Life Cycle Status:   Active ; Responsible Provider:   RODOLFO NEST L-FNP; Vocabulary:   SNOMED CT        Back pain (SNOMED CT  :747688984 )  Name of Problem:   Back pain ; Recorder:   PYLE, RN, JOYCE; Confirmation:   Confirmed ; Classification:   Patient Stated ; Code:   747688984 ; Contributor System:   PowerChart ; Last Updated:   06/28/2019 11:25 EST ; Life Cycle Date:   06/28/2019 ; Life Cycle Status:   Active  ; Vocabulary:   SNOMED CT        History of DVT of lower extremity (SNOMED CT  :7013061988 )  Name of Problem:   History of DVT of lower extremity ; Recorder:   RODOLFO NEST L-FNP; Confirmation:   Confirmed ; Classification:   Medical ; Code:   7013061988 ; Contributor System:   PowerChart ; Last Updated:   03/21/2021 11:03 EDT ; Life Cycle Date:   03/21/2021 ; Life Cycle Status:   Active ; Vocabulary:   SNOMED CT        OSA (obstructive sleep apnea) (SNOMED CT  :870110984 )  Name of Problem:   OSA (obstructive sleep apnea) ; Recorder:   PYLE, RN, JOYCE; Confirmation:   Confirmed ; Classification:   Patient Stated ; Code:   870110984 ; Contributor System:   PowerChart ; Last Updated:   06/28/2019 11:25 EST ; Life Cycle Date:   06/28/2019 ; Life Cycle Status:   Active ; Vocabulary:   SNOMED CT        Sciatica (SNOMED CT  :61272986 )  Name of Problem:   Sciatica ; Recorder:   PYLE, RN, JOYCE; Confirmation:   Confirmed ; Classification:   Patient Stated ; Code:   61272986 ; Contributor System:   PowerChart ; Last Updated:   06/28/2019 11:25 EST ; Life Cycle Date:   06/28/2019 ; Life Cycle Status:   Active ; Vocabulary:   SNOMED CT          Diagnoses(Active)    Cervical myelopathy  Date:   03/04/2021 ; Diagnosis Type:   Discharge ; Confirmation:   Confirmed ; Clinical Dx:   Cervical myelopathy ; Classification:   Medical ; Clinical Service:   Non-Specified ; Code:   ICD-10-CM ; Probability:   0 ; Diagnosis Code:   G95.9        Procedure History        -    Procedure History   (As Of: 04/01/2021 07:49:04 EDT)     Anesthesia Minutes:   0 ; Procedure Name:   Hernia ; Procedure Minutes:   0 ; Last Reviewed Dt/Tm:   04/01/2021 07:46:09 EDT            Anesthesia Minutes:   0 ; Procedure Name:   Tonsillectomy ; Procedure Minutes:   0 ; Last Reviewed Dt/Tm:   04/01/2021 07:46:09 EDT            Procedure Dt/Tm:   06/29/2019 11:32:00 EST ; Location:   [facility] ; Provider:   GUERRY RONALEE ORN; Anesthesia Type:   Local  ; Anesthesia Minutes:   0 ; Procedure Name:   Transforaminal Epidural Ster Inj ; Procedure Minutes:   2 ; Comments:     06/29/2019 11:34 EST - SHERRILEE, RN, [name] A  auto-populated from documented surgical case ; Clinical Service:   Surgery ;  Last Reviewed Dt/Tm:   04/01/2021 07:46:09 EDT            Procedure Dt/Tm:   8004 ; Anesthesia Minutes:   0 ; Procedure Name:   thumb surgery ; Procedure Minutes:   0 ; Last Reviewed Dt/Tm:   04/01/2021 07:46:09 EDT            Procedure Dt/Tm:   04/22/2011 ; Anesthesia Minutes:   0 ; Procedure Name:   Lumbar fusion L4-5 ; Procedure Minutes:   0 ; Last Reviewed Dt/Tm:   04/01/2021 07:46:09 EDT            Procedure Dt/Tm:   8027 ; Anesthesia Minutes:   0 ; Procedure Name:   tonsils and adenoids ; Procedure Minutes:   0 ; Last Reviewed Dt/Tm:   04/01/2021 07:46:09 EDT            Procedure Dt/Tm:   2012 ; Anesthesia Minutes:   0 ; Procedure Name:   redo lumbar fusion L5-S1 ; Procedure Minutes:   0 ; Last Reviewed Dt/Tm:   04/01/2021 07:46:09 EDT            Anesthesia Minutes:   0 ; Procedure Name:   colonoscopy ; Procedure Minutes:   0 ; Last Reviewed Dt/Tm:   04/01/2021 07:46:09 EDT            Procedure Dt/Tm:   8018 ; Anesthesia Minutes:   0 ; Procedure Name:   facial surgery ; Procedure Minutes:   0 ; Last Reviewed Dt/Tm:   04/01/2021 07:46:09 EDT            Procedure Dt/Tm:   2005 ; Anesthesia Minutes:   0 ; Procedure Name:   L5-S1 lumbar fusion ; Procedure Minutes:   0 ; Last Reviewed Dt/Tm:   04/01/2021 07:46:09 EDT            Procedure Dt/Tm:   08/26/2019 08:40:00 EDT ; Location:   [facility] ; Provider:   VIKI,  CURTIS-MD; Anesthesia Type:   General ; :   DEBBY ADE L-MD; Anesthesia Minutes:   0 ; Procedure Name:   Anterior Cervical Discectomy with Fusion and/or Stabilization SCIP ; Procedure Minutes:   166 ; Comments:     08/26/2019 11:38 EDT - Glendora, RN, Sharlet PARAS  auto-populated from documented surgical case ; Clinical Service:   Surgery ; Last Reviewed Dt/Tm:    04/01/2021 07:46:09 EDT            Procedure Dt/Tm:   05/31/2020 11:05:00 EST ; Location:   [facility] ; Provider:   SHEENA HOMER W-MD; Anesthesia Type:   Local ; Anesthesia Minutes:   0 ; Procedure Name:   Transforaminal Epidural Ster Inj ; Procedure Minutes:   6 ; Comments:     05/31/2020 11:13 EST - PYLE, RN, JOYCE  auto-populated from documented surgical case ; Clinical Service:   Surgery ; Last Reviewed Dt/Tm:   04/01/2021 07:46:09 EDT            Procedure Dt/Tm:   05/31/2020 11:05:00 EST ; Location:   [facility] ; Provider:   SHEENA HOMER W-MD; Anesthesia Type:   Local ; Anesthesia Minutes:   0 ; Procedure Name:   Si Joint Injection ; Procedure Minutes:   6 ; Comments:     05/31/2020 11:13 EST - PYLE, RN, JOYCE  auto-populated from documented surgical case ; Clinical Service:   Surgery ; Last Reviewed Dt/Tm:  04/01/2021 07:46:09 EDT            Procedure Dt/Tm:   07/03/2020 13:53:00 EST ; Location:   [facility] ; Provider:   SHEENA HOMER W-MD; Anesthesia Type:   Local ; Anesthesia Minutes:   0 ; Procedure Name:   Lumbar Epidural Steroid Injection ; Procedure Minutes:   6 ; Comments:     07/03/2020 13:59 EST - Dasie, RN, Forbes A  auto-populated from documented surgical case ; Clinical Service:   Surgery ; Last Reviewed Dt/Tm:   04/01/2021 07:46:09 EDT            Procedure Dt/Tm:   07/03/2020 13:53:00 EST ; Location:   [facility] ; Provider:   SHEENA HOMER W-MD; Anesthesia Type:   Local ; Anesthesia Minutes:   0 ; Procedure Name:   Si Joint Injection (Bilateral) ; Procedure Minutes:   6 ; Comments:     07/03/2020 13:59 EST - Dasie, RN, Forbes A  auto-populated from documented surgical case ; Clinical Service:   Surgery ; Last Reviewed Dt/Tm:   04/01/2021 07:46:09 EDT            History Confirmation   Problem History Changes PAT :   No   Procedure History Changes PAT :   No   Jesse Savage - 04/01/2021 7:43 EDT   Anesthesia/Sedation   Anesthesia History :    Prior general anesthesia   SN - Malignant Hyperthermia :   Denies   Previous Problem with Anesthesia :   None   Symptoms of Sleep Apnea :   Age greater than 50, Hypertension, Loud snoring, Male Gender   Symptoms of Sleep Apnea Score (STOP BANG) :   4    Shortness of Breath Indicator :   No shortness of breath   Jesse Savage - 04/01/2021 7:43 EDT   Refer to Anesth Preop Note :   Refer to Anesthesia Preoperative Note- Preanesthesia Evaluation: Preop Clinic   CLAUDENE CHARMAINE BRAVO - 03/21/2021 9:39 EDT   Bloodless Medicine   Is Blood Transfusion Acceptable to Patient :   Yes   CLAUDENE CHARMAINE BRAVO - 03/21/2021 10:44 EDT   ID Risk Screen Symptoms   Recent Travel History :   No recent travel   TB Symptom Screen :   No symptoms   Last 90 days COVID-19 ID :   No   Close Contact with COVID-19 ID :   No   Last 14 days COVID-19 ID :   No   C. diff Symptom/History ID :   Neither of the above   CLAUDENE CHARMAINE E - 03/21/2021 10:44 EDT   Social History   Social History   (As Of: 04/01/2021 07:49:04 EDT)   Tobacco:        Tobacco use: Never (less than 100 in lifetime).   (Last Updated: 08/25/2019 09:36:30 EDT by RODOLFO NEST L-FNP)          Electronic Cigarette/Vaping:        Never Electronic Cigarette Use.   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

## 2021-04-01 ENCOUNTER — Encounter: Attending: Specialist | Primary: Internal Medicine

## 2021-04-01 NOTE — Assessment & Plan Note (Signed)
PreOp Record - [facility]             PreOp Record - [facility] Summary                                                                     Primary Physician:        Candi Leash C-MD    Case Number:              [id number]    Finalized Date/Time:      04/01/21 07:50:06    Pt. Name:                 Jesse Savage, Jesse Savage    D.O.B./Sex:               [birth year]    Male    Med Rec #:                [id number]    Physician:                Candi Leash C-MD    Financial #:              [id number]    Pt. Type:                 S    Room/Bed:                 /    Admit/Disch:              04/01/21 06:30:00 -    Institution:       [facility] Case Attendance - PreOp                                                                                              Entry 1                         Entry 2                                                                          Case Attendee             Alcide Goodness,  WARD C-MD         Sherrie Sport    Role Performed            Surgeon Primary                 Preoperative Nurse    Time In     Time Out     Last Modified By:  Janeece Agee                              04/01/21 07:20:48               04/01/21 07:20:58      [facility] Case Attendance - PreOp Audit                                                               04/01/21 07:20:58         Owner: [id number]                              Modifier: [id number]                                                       <+> 2         Case Attendee        <+> 2         Role Performed        [facility] Case Times - PreOp                                                                                                   Entry 1                                                                                                          Patient In Room Time      04/01/21 06:54:00               Nurse In Time                   04/01/21 07:24:00    Nurse Out Time            04/01/21 07:50:00               Patient Ready for                04/01/21 07:50:00  Surgery/Procedure     Last Modified By:         Sherrie Sport                              04/01/21 07:50:05      [facility] Case Times - PreOp Audit                                                                    04/01/21 07:50:05         Owner: [id number]                              Modifier: [id number]                                                       <+> 1         Patient Ready for Surgery/Procedure        <+> 1         Nurse Out Time                Finalized By: Sherrie Sport      Document Signatures                                                                             Signed By:           Sherrie Sport 04/01/21 07:50

## 2021-04-01 NOTE — Op Note (Signed)
Phase I Record - [facility]             Phase I Record - [facility] Summary                                                                   Primary Physician:        Candi Leash C-MD    Case Number:              [id number]    Finalized Date/Time:      04/01/21 14:26:32    Pt. Name:                 Jesse Savage, Jesse Savage    D.O.B./Sex:               30-Jul-1960    Male    Med Rec #:                [id number]    Physician:                Candi Leash C-MD    Financial #:              [id number]    Pt. Type:                 R    Room/Bed:                 /    Admit/Disch:              04/01/21 06:30:00 -    Institution:       [facility] Case Attendance - Phase I                                                                                            Entry 1                         Entry 2                                                                          Case Attendee             Alcide Goodness,  WARD C-MD         Janet Berlin    Role Performed            Surgeon Primary                 Post Anesthesia Care  Nurse    Time In                                                   04/01/21 12:46:00    Time Out     Last Modified By:         Juanetta Gosling [facility]                              04/01/21 12:50:55               04/01/21 12:51:29      [facility] Case Attendance - Phase I Audit                                                             04/01/21 12:51:29         Owner: [id number]                               Modifier: [id number]                                                        <+> 2         Case Attendee        <+> 2         Role Performed        <+> 2         Time In        [facility] Case Times - Phase I                                                                                                 Entry 1                                                                                                          Phase I In                 04/01/21 11:45:00  Phase I Out                     04/01/21 14:26:00    Phase I Discharge         04/01/21 14:26:00    Time     Last Modified By:         Carolann Littler L-RN                              04/01/21 14:26:31      [facility] Case Times - Phase I Audit                                                                  04/01/21 14:26:31         Owner: [id number]                               Modifier: [id number]                                                        <+> 1         Phase I Out        <+> 1         Phase I Discharge Time                Finalized By: Janet Berlin      Document Signatures                                                                             Signed By:           Carolann Littler L-RN 04/01/21 14:26

## 2021-04-01 NOTE — Procedures (Signed)
IntraOp Record - [facility]             IntraOp Record - [facility] Summary                                                                   Primary Physician:        Marya Landry C-MD    Case Number:              [id number]    Finalized Date/Time:      04/01/21 12:48:11    Pt. Name:                 Jesse Savage, Jesse Savage    D.O.B./Sex:               Nov 14, 1960    Male    Med Rec #:                [id number]    Physician:                Marya Landry C-MD    Financial #:              [id number]    Pt. Type:                 S    Room/Bed:                 /    Admit/Disch:              04/01/21 06:30:00 -    Institution:       [facility] - Case Times                                                                                                         Entry 1                                                                                                          Patient      In Room Time             04/01/21 07:57:00               Out Room Time                   04/01/21 12:44:00    Anesthesia     Procedure  Start Time               04/01/21 09:21:00               Stop Time                       04/01/21 12:35:00    Last Modified By:         Rhetta Mura                              04/01/21 12:47:58      S[facility] - Case Times Audit                                                                          04/01/21 12:47:58         Owner: [id number]                              Modifier: [id number]                                                       <+> 1         Out Room Time     04/01/21 12:38:39         Owner: [id number]                              Modifier: [id number]                                                       <+> 1         Stop Time        [facility] - Case Attendance                                                                                                    Entry 1                         Entry 2                         Entry 3  Case Attendee             [place],   WARD C-MD         [place],  Perrin Maltese  [facility]                                                                                              D-CRNA    Role Performed            Surgeon Primary                 First Assistant                 CRNA    Time In                   04/01/21 07:57:00               04/01/21 07:57:00               04/01/21 07:57:00    Time Out                  04/01/21 12:44:00               04/01/21 12:44:00               04/01/21 12:44:00    Procedure                 Posterior Cervical              Posterior Cervical              Posterior Cervical                              Laminectomy/Discectom           Laminectomy/Discectom           Laminectomy/Discectom    Last Modified By:         Edison Pace RN, Bari Mantis, RN, Bari Mantis, RNFabio Asa                              04/01/21 12:48:07               04/01/21 12:48:07               04/01/21 12:48:07                                Entry 4                         Entry 5  Entry 6                                          Case Attendee             Marni Griffon                Freddi Che                              [facility]    Role Performed            Anesthesiologist                Radiology Tech                  Surgical Scrub    Time In                   04/01/21 07:57:00               04/01/21 07:57:00               04/01/21 07:57:00    Time Out                  04/01/21 12:44:00               04/01/21 12:44:00               04/01/21 12:44:00    Procedure                 Posterior Cervical              Posterior Cervical              Posterior Cervical                              Laminectomy/Discectom           Laminectomy/Discectom           Laminectomy/Discectom    Last Modified By:         Edison Pace RN, Bari Mantis, RN, Bari Mantis, RNFabio Asa                              04/01/21  12:48:07               04/01/21 12:48:07               04/01/21 12:48:07                                Entry 7  Case Attendee             Edison Pace RN, Fabio Asa    Role Performed            Circulator    Time In                   04/01/21 07:57:00    Time Out                  04/01/21 12:44:00    Procedure                 Posterior Cervical                              Laminectomy/Discectom    Last Modified By:         Rhetta Mura                              04/01/21 12:48:07      SFOR - Case Attendance Audit                                                                     04/01/21 12:48:07         Owner: [id number]                              Modifier: [id number]                                                           1     <+> Time In            1     <+> Time Out            1     <*> Procedure                              Posterior Cervical Laminectomy/Discectom            2     <+> Time In            2     <+> Time Out            2     <*> Procedure                              Posterior Cervical Laminectomy/Discectom            3     <+> Time In            3     <+> Time Out            3     <*> Procedure  Posterior Cervical Laminectomy/Discectom            4     <+> Time In            4     <+> Time Out            4     <*> Procedure                              Posterior Cervical Laminectomy/Discectom            5     <+> Time In            5     <+> Time Out            5     <*> Procedure                              Posterior Cervical Laminectomy/Discectom            6     <+> Time In            6     <+> Time Out            6     <*> Procedure                              Posterior Cervical Laminectomy/Discectom            7     <+> Time In            7     <+> Time Out            7     <*> Procedure                              Posterior Cervical  Laminectomy/Discectom     04/01/21 09:25:09         Owner: [id number]                              Modifier: [id number]                                                       <+> 2         Case Attendee        <+> 2         Role Performed        <+> 2         Procedure        <+> 3         Case Attendee        <+> 3         Role Performed        <+> 3         Procedure        <+> 4         Case Attendee        <+> 4         Role Performed        <+>  4         Procedure        <+> 5         Case Attendee        <+> 5         Role Performed        <+> 5         Procedure        <+> 6         Case Attendee        <+> 6         Role Performed        <+> 6         Procedure        <+> 7         Case Attendee        <+> 7         Role Performed        <+> 7         Procedure        SFOR - Skin Assessment                                                                          Pre-Care Text:            A.240 Assesses baseline skin condition  Im.120 Implements protective measures to prevent skin or tissue injury           due to mechanical sources   Im.280.1 Implements progective measures to prevent skin or tissue injury due to           thermal sources  Im.360 Monitors for signs and symptons of infection                              Entry 1                                                                                                          Skin Integrity            Intact    Last Modified By:         Rhetta Mura                              04/01/21 09:34:36    Post-Care Text:            E.10 Evaluates for signs and symptoms of physical injury to skin and tissue  E.270 Evaluate tissue perfusion           O.60 Patient is free from signs and symptoms of injury caused by extraneous objects  O.210 Patinet's tissue           perfusion is consistent with or improved from baseline levels      SFOR - Patient Positioning                                                                      Pre-Care Text:            A.240  Assesses baseline skin condition  A.280 Identifies baseline musculoskeletal status  A.280.1 Identifies           physical alterations that require additional precautions for procedure-specific positioning  A.510.8 Maintains           patient's dignity and privacy  Im.120 Implements protective measures to prevent skin/tissue injury due to           mechanical sources  Im.40 Positions the patient  Im.80 Applies safety devices                              Entry 1                                                                                                          Procedure                 Posterior Cervical              Body Position                   Prone                              Laminectomy/Discectom    Left Arm Position         Tucked and Padded at            Right Arm Position              Tucked and Padded at                              Side                                                            Side    Left Leg Position         Padding Under Knee,             Right Leg Position  Pillow Under Knee,                              Pillow Under Lower Leg,                                         Pillow Under Lower Leg,                              Extended Security Strap                                         Extended Security Strap    Feet Uncrossed            Yes                             Pressure Points                 Yes                                                              Checked     Positioning Device        Foam Padding, Pillow,           Positioned By                   [employer],  WARD                              Gel Roll, Head                                                  C-MD, DAVIS,  [facility]                              Positioner Skull Pins                                           A-NP, PFEIFFER,                                                                                              CHRISTINA D-CRNA,  Stevphen Meuse,                                                                                              RN, Fabio Asa    Outcome Met (O.80)        Yes    Last Modified By:         Rhetta Mura                              04/01/21 09:34:02    Post-Care Text:            E.10 Evaluates for signs and symptoms of physical injury to skin and tissue  E.290 Evaluates musculoskeletal           status  O.80 Patient is free from signs and symptoms of injury related to positioning  O.120 the patient is           free from signs and symptoms of injury related to transfer/transport   O.250 Patient's musculoskeletal status           is maintained at or improved from baseline levels      SFOR - Skin Prep                                                                                Pre-Care Text:            A.30 Verifies allergies  A.20 Verifies procedure, surgical site, and laterality  A.510.8 Maintains paritnet's           dignity and privacy  Im.270 Performs Skin Preparation  Im.270.1 Implements protective measures to prevent  skin           and tissue injury due to chemical sources   A.300.1 Protects from cross-contamination                              Entry 1                                                                                                          Hair Removal     Skin Prep      Prep Agents (Im.270)     Povidone Iodine Scrub           Prep Area (Im.270)              Neck                              7.5%, Povidone Iodine                              Solution 10%,                              Chlorhexidine Gluconate                              2% w/Alcohol     Prep By                  Edison Pace RN, Fabio Asa    Outcome Met (O.100)        Yes    Last Modified By:         Rhetta Mura                              04/01/21 09:35:03    Post-Care Text:            E.10 Evaluates for signs and symptoms of physical injury to skin and tissue  O.100 Patient is free from signs           and symptoms of chemical injury   O.740 The patient's right to privacy is maintained      SFOR - Counts Initial and Final                                                                 Pre-Care Text:            A.86 Verifies operative procedure, sugical site, and laterality  A.20.2 Assesses the risk for unintended           retained  foreign body  Im.20 Performs required counts                              Entry 1                                                                                                          Initial Counts      Initial Counts           Fransisca Kaufmann,             Items included in               Sponges, Sharps     Performed By             Edison Pace RN, Fabio Asa           the Initial Count     Final Counts      Final Counts             Fransisca Kaufmann,             Final Count Status              Correct     Performed By             Edison Pace RN, Fabio Asa     Items Included in        Sponges, Sharps     Final Count     Outcome Met (O.20)        Yes    Last Modified By:         Rhetta Mura                              04/01/21 09:30:19    Post-Care Text:            E.50 Evaluates results of the surgical count  O.20 Patient is free from unintended retained foreign objects      SFOR - Counts Additional                                                                        Pre-Care Text:            A.80 Verifies operative procedure, sugical site, and laterality  A.20.2 Assesses the risk for unintended           retained foreign body  Im.20 Performs required counts                              Entry 1  Additional Count           Closing Count                   Additional Count                Fransisca Kaufmann,    Type                                                      Participants                    Ovadenko, RN, Fabio Asa    Count Status              Correct                         Items Counted                   Sponges, Sharps    Outcome Met (O.20)        Yes    Last Modified By:         Rhetta Mura                              04/01/21 09:30:35    Post-Care Text:            E.50 Evaluates results of the surgical count  O.20 Patient is free from unintended retained foreign objects      SFOR - General Case Data                                                                        Pre-Care Text:            A.350.1 Classifies surgical wound                              Entry 1                                                                                                          Case Information      ASA Class                3                               Case Level  Level 4     OR                       SF 07                           Specialty                       Neurosurgery (SN)     Wound Class              1-Clean    Preop Diagnosis           CERVICAL MYELOPATHY             Postop Diagnosis                same    Last Modified By:         Rhetta Mura                              04/01/21 09:25:34    Post-Care Text:            O.760 Patient receives consistent and comparable care regardless of the setting      SFOR - Fire Risk Assessment                                                                                               Entry 1                                                                                                          Fire Risk                 Alcohol Based Prep              Fire Risk Score                 3+    Assessment: If            Solution, Surgical Site    checked, checkmark        Above Xiphoid, Ignition    = 1 point                 Source In Use    Fire Risk  Protocol     (Reference Only)     Last Modified By:         Rhetta Mura  04/01/21 09:30:47      SFOR - Time Out                                                                                 Pre-Care Text:            A.10 Confirms patient identity  A.20 Verifies operative procedure, surgical site, and laterality  A.20.1           Verifies consent for planned procedure  A.30 Verifies allergies                              Entry 1                                                                                                          Procedure                 Posterior Cervical              Patient name and                Yes                              Laminectomy/Discectom           DOB confirmed.                                                               Allergies                                                               confirmed. Surgical                                                               procedure to be  performed confirmed                                                               and verified by                                                               completed surgical                                                               consent     Surgical site             Yes                             Essential imaging,              Yes    confirmed. Correct                                        required blood     surgical site                                             products, implants,     marked and initials                                       devices and/or     are visible through                                       special equipment     prepped and draped                                        available and     field (or                                                 sterilization     alternative ID band                                       indicators confirmed  used),  if applicable     Surgeon shares            Yes                             Anesthesia shares               Yes    operative plan,                                           anesthetic plan and     anticipated                                               reviews patient     specimens                                                 specific concerns     discussed, possible                                       and confirms     difficulties,                                             administration of     expected duration,                                        antibiotics, if     anticipated blood                                         applicable     loss and reviews     all     critical/specific     concerns     Fire risk                 Yes                             Surgeon states:                 Yes    assessment scored                                         Does anyone have     and plan discussed  any concerns? If                                                               you see, suspect,                                                               or feel that                                                               patient care is                                                               being compromised,                                                               speak up for                                                               patient safety     Time Out Complete         04/01/21 09:00:00    Last Modified By:         Rhetta Mura                              04/01/21 09:34:17    Post-Care Text:            E.30 Evaluates verification process for correct patient, site, side, and level surgery      SFOR - Debrief                                                                                  Pre-Care Text:            Im.330 Manages specimen handling and disposition  Entry 1                                                                                                           Procedure                 Posterior Cervical              Final counts                    Yes                              Laminectomy/Discectom           correct and                                                               verbally verified                                                               with                                                               surgeon/licensed                                                               independent                                                               practitioner (if                                                               applicable)     Actual procedure          Yes  Postop diagnosis                Yes    performed confirmed                                       confirmed     Wound                     Yes                             Confirm specimens               Yes    classification                                            and specimens     confirmed                                                 labeled                                                               appropriately (if                                                               applicable)     Equipment problems        Yes                             Foley catheter                  Yes    addressed (if                                             removed (if     applicable)                                               applicable)     Patient recovery          Yes                             Debrief Complete                04/01/21 12:30:00    plan confirmed  Last Modified By:         Rhetta Mura                              04/01/21 12:38:32    Post-Care Text:            E.800 Ensures continuity of care  E.50 Evaluates results of the surgical count  O.30 Patient's procedure is           performed on the correct site, side, and level  O.50 patient's  current status is communicated throughout the           continuum of care  O.40 Patient's specimen(s) is managed in the appropriate manner      SFOR - Debrief Audit                                                                             04/01/21 12:38:32         Owner: [id number]                              Modifier: [id number]                                                           1     <*> Procedure                              Posterior Cervical Laminectomy/Discectom            1     <+> Debrief Complete        SFOR - Cautery                                                                                  Pre-Care Text:            A.240 Assesses baseline skin condition  A280.1 Identifies baseline musculoskeletal status  Im.50 Implements           protective measures to prevent injury due to electrical sources   Im.60 Uses supplies and equipment within safe           parameters  Im.80 Applies safety devices                              Entry 1                         Entry 2  ESU Type                  GENERATOR                       GENERATOR CODMAN BIPOLAR                              COVID[employer]    Identification            [id number]                           [id number]    Number     Coag Setting (watts)      35                              35    Cut Setting (watts)     Bipolar Setting     (watts)     Blend/Effect     Setting (watts)     Argon Plasma     Coagulator Mode     Flow Rate/Power     Level     Endocut     Endocut Energy     (watts)     Grounding Pad             Yes    Needed?     Grounding Pad Site        Thigh, right    Grounding Pad             Ovadenko, RN, Fabio Asa    Applied By     ESU Comment     Outcome Met (O.10)        Yes                             Yes    Last Modified By:         Edison Pace RN, Bari Mantis, RNFabio Asa                              04/01/21 09:29:52               04/01/21 09:29:52     Post-Care Text:            E.10 Evaluates for signs and symptoms of physical injury to skin and tissue  O.10 Patient is free from signs           and symptoms of injury related to thermal sources   O.70 Patient is free from signs and symptoms of electrical           injury      SFOR - Patient Care Devices                                                                     Pre-Care Text:            A.200 Assesses risk for normothermia  regulation  A.40 Verifies presence of prosthetics or corrective devices           Im.53 Implements thermoregulation measures  Im.60 Uses supplies and equipment within safe parameters                              Entry 1                                                                                                          Equipment Type            MACHINE SEQUENTIAL              SCD Sleeve Site                 Legs Bilateral                              COMPRESSION    Equipment/Tag Number      [id number]                           Initiated Pre                   Yes                                                              Induction     Last Modified By:         Rhetta Mura                              04/01/21 09:32:21    Post-Care Text:            E.10 Evaluates signs and symptoms of physical injury to skin and tissue  O.60 Patient is free from signs and           symptoms of injury caused by extraneous objects      SFOR - Medications                                                                              Pre-Care Text:            A.10 Confirms patient identity  A.30 Verifies allergies  Im.220 Administers prescribed medications  Im.220.2           Administers prescribed antibiotic therapy as ordered  Entry 1                         Entry 2                         Entry 3                                          Time Administered         04/01/21 09:30:00               04/01/21 09:30:00               04/01/21 09:30:00    Medication                 BUPIVACAINE 0.25% W/EPI         GENTAMICIN 80MG-NACL            SURGIFOAM 8CM X 12.5CM                              MPF INJ                         0.9% INJECTION 1000ML           X 10CM (ETHICON 1974)    Route of Admin            Local Injection                 Irrigation                      Topical    Dose/Volume               21m                            868m1000ml on field            1 pad    (include amount and     unit of measure)     Site                      Neck                            Neck                            Neck    Site Detail     Administered By           WOMarya Landry-MD         WORTHINGTON,  WARD C-MD         WORTHINGTON,  WARD C-MD    Outcome Met (O.130)       Yes                             Yes                             Yes    Last Modified  By:         Edison Pace RN, Bari Mantis, RN, Bari Mantis, RNFabio Asa                              04/01/21 09:31:53               04/01/21 09:31:53               04/01/21 09:31:53    Post-Care Text:            E.20 Evaluates response to medications  O.130 Patient receives appropriately administerd medication(s)      [facility] - Communication                                                                            Pre-Care Text:            A.520 Identifies barriers to communication (Patient and Family Communications)  A.20 Verifies operative           procedure, surgical site, and laterality Education officer, museum)  Im.500 Provides status reports to family           members  Im.150 Develops individualized plan of care                              Entry 1                                                                                                          Communication             Phone Call                      Communication By                Rhetta Mura    Date and Time             04/01/21 10:21:00               Communication To                spoke with wife gail    Last Modified By:          Rhetta Mura                              04/01/21 10:21:31    Post-Care Text:            E.520 Evaluates psychosocial  response to plan of care  O.500 Patient or designated support person demonstrates           knowledge of the expected psychosocial responses to the procedure  E.800 Ensures continuity of care  O.50           Patient's current status is communicated throughout the continuum of care      [facility] - Dressing/Packing                                                                         Pre-Care Text:            A.350 Assesses susceptibility for infection  Im.250 Administers care to invasive devices  Im.290 Administer           care to wound sites   Im.300 Implements aseptic technique                              Entry 1                                                                                                          Site                      Neck    Dressing Item     Details      Dressing Item            Silver Impregnated     (Im.290)                 Dressing    Last Modified By:         Rhetta Mura                              04/01/21 12:38:55    Post-Care Text:            E.320 Evaluate factors associted with increased risk for postoperative infection at the completion of the           procedure  O.200 Patient's wound perfusion is consistent with or improved from baseline levels   O.Patient is           free from signs and symptoms of infection      [facility] - Procedures  Pre-Care Text:            A.20 Verifies operative procedure, surgical site, and laterality  Im.150 Develops individualized plan of care                              Entry 1                                                                                                          Procedure     Description      Procedure                Posterior Cervical              Surgical Procedure              C5/6 POSTERIOR CERVICAL                               Laminectomy/Discectomy/D        Text                            LAMINECTOMY                              ecompression SCIP    Primary Procedure         Yes                             Primary Surgeon                 [place],  Presquille C-MD    Start                     04/01/21 09:21:00               Stop                            04/01/21 12:35:00    Anesthesia Type           General                         Surgical Service                Neurosurgery (SN)    Wound Class               1-Clean    Last Modified By:         Rhetta Mura                              04/01/21 12:39:41    Post-Care Text:  O.730 The patient's care is consistent with the individualized perioperative plan of care      [facility] - Transfer                                                                                                           Entry 1                                                                                                          Transferred By            Edison Pace, RN, Fabio Asa,          Via                             Stretcher                              Johnney Killian  [facility]                              D-CRNA    Post-op Destination       PACU    Skin Assessment      Condition                Not Intact / Abnormality        Description                     surgical incision    Last Modified By:         Rhetta Mura                              04/01/21 09:37:14      Case Comments                                                                                         <None>              Finalized By: Ovadenko, RN, [place]  Signed By:           Rhetta Mura 04/01/21 12:48

## 2021-04-01 NOTE — Nursing Note (Signed)
Adult Patient History Form-Text       Adult Patient History Entered On:  04/01/2021 15:21 EDT    Performed On:  04/01/2021 15:19 EDT by Theda Belfast C-RN               General Info   Patient Identified :   Identification band, Verbal   Patient Identified :   Jesse Savage   Information Given By :   Self   Preferred Mode of Communication :   Verbal, Written   Accompanied By :   Spouse   Pregnancy Status :   N/A   Has the patient received chemotherapy or immunotherapy (cytotoxic)  in the last 48-72 hours? :   No   In Clinical Trial With Signed Consent for Related Condition :   No signed consent for clinical trial   Is the patient currently (2-3 days) receiving radiation treatment? :   No   Theda Belfast C-RN - 04/01/2021 15:19 EDT   Allergies   (As Of: 04/01/2021 15:21:09 EDT)   Allergies (Active)   codeine  Estimated Onset Date:   Unspecified ; Reactions:   NA ; Created By:   Haynes Hoehn D; Reaction Status:   Active ; Category:   Drug ; Substance:   codeine ; Type:   Allergy ; Severity:   Unknown ; Updated By:   Haynes Hoehn D; Reviewed Date:   04/01/2021 15:19 EDT      Cymbalta  Estimated Onset Date:   Unspecified ; Reactions:   NA ; Created By:   Haynes Hoehn D; Reaction Status:   Active ; Category:   Drug ; Substance:   Cymbalta ; Type:   Allergy ; Severity:   Unknown ; Updated By:   Haynes Hoehn D; Reviewed Date:   04/01/2021 15:19 EDT      Neurontin  Estimated Onset Date:   Unspecified ; Reactions:   NA ; Created By:   Haynes Hoehn D; Reaction Status:   Active ; Category:   Drug ; Substance:   Neurontin ; Type:   Allergy ; Severity:   Unknown ; Updated By:   Haynes Hoehn D; Reviewed Date:   04/01/2021 15:19 EDT        Medication History   Medication List   (As Of: 04/01/2021 15:21:09 EDT)   Normal Order    Sodium Chloride 0.45% intravenous solution 1,000 mL  :   Sodium Chloride 0.45% intravenous solution 1,000 mL ; Status:   Ordered ; Ordered As Mnemonic:   Sodium Chloride 0.45% 1,000  mL ; Simple Display Line:   100 mL/hr, IV ; Ordering Provider:   Lenore Cordia; Catalog Code:   Sodium Chloride 0.45% ; Order Dt/Tm:   04/01/2021 14:57:34 EDT          Lactated Ringers Injection solution 1000 mL  :   Lactated Ringers Injection solution 1000 mL ; Status:   Ordered ; Ordered As Mnemonic:   Lactated Ringers Injection 1000 mL ; Simple Display Line:   40 mL/hr, IV ; Ordering Provider:   Lenore Cordia; Catalog Code:   Lactated Ringers Injection ; Order Dt/Tm:   03/04/2021 10:18:36 EDT ; Comment:   Perioperative use ONLY  For Non Dialysis Patient          Sodium Chloride 0.9% intravenous solution 1000 mL  :   Sodium Chloride 0.9% intravenous solution 1000 mL ; Status:   Ordered ; Ordered As Mnemonic:  Sodium Chloride 0.9% 1000 mL ; Simple Display Line:   40 mL/hr, IV ; Ordering Provider:   Ardis Hughs A-NP; Catalog Code:   Sodium Chloride 0.9% ; Order Dt/Tm:   03/04/2021 10:18:36 EDT ; Comment:   Perioperative use ONLY          glycerin adult Rectal Supp  :   glycerin adult Rectal Supp ; Status:   Ordered ; Ordered As Mnemonic:   glycerin adult rectal suppository ; Simple Display Line:   1 supp, PR, Once ; Ordering Provider:   Lenore Cordia; Catalog Code:   glycerin ; Order Dt/Tm:   04/01/2021 14:57:34 EDT ; Comment:   " THIS IS A PATIENT SPECIFIC   MEDICATION IN YOUR PYXIS MACHINE  "          polyethylene glycol 3350 Oral Powder for Recon  :   polyethylene glycol 3350 Oral Powder for Recon ; Status:   Ordered ; Ordered As Mnemonic:   MiraLax ; Simple Display Line:   17 g, 1 packets, Oral, Daily ; Ordering Provider:   Lenore Cordia; Catalog Code:   polyethylene glycol 3350 ; Order Dt/Tm:   04/01/2021 14:57:34 EDT          ceFAZolin duplex  :   ceFAZolin duplex ; Status:   Ordered ; Ordered As Mnemonic:   Ancef ; Simple Display Line:   2 g, 50 mL, 100 mL/hr, IV Piggyback, q8hr ; Ordering Provider:   Lenore Cordia; Catalog Code:   ceFAZolin ; Order Dt/Tm:    04/01/2021 14:57:34 EDT ; Comment:   x 3 doses          docusate-senna 50 mg-8.6 mg Tab  :   docusate-senna 50 mg-8.6 mg Tab ; Status:   Ordered ; Ordered As Mnemonic:   docusate-senna 50 mg-8.6 mg oral tablet ; Simple Display Line:   1 tabs, Oral, BID ; Ordering Provider:   Lenore Cordia; Catalog Code:   docusate-senna ; Order Dt/Tm:   04/01/2021 14:57:34 EDT ; Comment:   hold for loose stools          famotidine 20 mg Tab  :   famotidine 20 mg Tab ; Status:   Ordered ; Ordered As Mnemonic:   famotidine ; Simple Display Line:   20 mg, 1 tabs, Oral, BID ; Ordering Provider:   Lenore Cordia; Catalog Code:   famotidine ; Order Dt/Tm:   04/01/2021 14:57:34 EDT          acetaminophen 500 mg Tab  :   acetaminophen 500 mg Tab ; Status:   Ordered ; Ordered As Mnemonic:   acetaminophen ; Simple Display Line:   500 mg, 1 tabs, Oral, q6hr ; Ordering Provider:   Lenore Cordia; Catalog Code:   acetaminophen ; Order Dt/Tm:   04/01/2021 14:57:34 EDT ; Comment:   Scheduled for multimodal pain control.  Do not exceed 4000mg  APAP in 24 hours.          dexAMETHasone 4 mg/mL Inj Soln 1 mL  :   dexAMETHasone 4 mg/mL Inj Soln 1 mL ; Status:   Ordered ; Ordered As Mnemonic:   dexAMETHasone ; Simple Display Line:   4 mg, 1 mL, IV Push, q6hr ; Ordering Provider:   Lenore Cordia; Catalog Code:   dexAMETHasone ; Order Dt/Tm:   04/01/2021 14:57:34 EDT          acetaminophen-oxyCODONE 325 mg-5 mg  Tab  :   acetaminophen-oxyCODONE 325 mg-5 mg Tab ; Status:   Ordered ; Ordered As Mnemonic:   oxyCODONE-acetaminophen 5 mg-325 mg oral tablet range dose ; Simple Display Line:   2 tabs, Oral, q6hr, PRN: moderate pain (4-7) ; Ordering Provider:   Lenore Cordia; Catalog Code:   acetaminophen-oxyCODONE ; Order Dt/Tm:   04/01/2021 14:57:34 EDT ; Comment:   MAX DAILY DOSE OF ACETAMINOPHEN = 4000 MG          aluminum hydroxide/magnesium hydroxide/simethicone 200 mg-200 mg-20 mg/5 mL  :   aluminum hydroxide/magnesium  hydroxide/simethicone 200 mg-200 mg-20 mg/5 mL ; Status:   Ordered ; Ordered As Mnemonic:   aluminum hydroxide/magnesium hydroxide/simethicone 200 mg-200 mg-20 mg/5 mL oral suspension ; Simple Display Line:   30 mL, Oral, q4hr, PRN: indigestion ; Ordering Provider:   Lenore Cordia; Catalog Code:   Al hydroxide/Mg hydroxide/simethicone ; Order Dt/Tm:   04/01/2021 14:57:34 EDT          cyclobenzaprine 10 mg Tab  :   cyclobenzaprine 10 mg Tab ; Status:   Ordered ; Ordered As Mnemonic:   cyclobenzaprine ; Simple Display Line:   10 mg, 1 tabs, Oral, TID, PRN: other (see comment) ; Ordering Provider:   Lenore Cordia; Catalog Code:   cyclobenzaprine ; Order Dt/Tm:   04/01/2021 14:57:34 EDT ; Comment:   For spasm unrelieved by Tizanidine if ordered.          diphenhydrAMINE  :   diphenhydrAMINE ; Status:   Ordered ; Ordered As Mnemonic:   diphenhydrAMINE ; Simple Display Line:   25 mg, Oral, q6hr, PRN: itching/allergic reaction ; Ordering Provider:   Lenore Cordia; Catalog Code:   diphenhydrAMINE ; Order Dt/Tm:   04/01/2021 14:57:34 EDT          hydrALAZINE 20 mg/mL Inj Soln 1 mL  :   hydrALAZINE 20 mg/mL Inj Soln 1 mL ; Status:   Ordered ; Ordered As Mnemonic:   hydrALAZINE ; Simple Display Line:   10 mg, 0.5 mL, IV, q6hr, PRN: hypertension ; Ordering Provider:   Lenore Cordia; Catalog Code:   hydrALAZINE ; Order Dt/Tm:   04/01/2021 14:57:34 EDT          lidocaine 2% Topical Gel with applicator 10-11 mL  :   lidocaine 2% Topical Gel with applicator 10-11 mL ; Status:   Ordered ; Ordered As Mnemonic:   Uro-Jet 2% topical gel with applicator ; Simple Display Line:   1 app, Transurethral, Once ; Ordering Provider:   Lenore Cordia; Catalog Code:   lidocaine topical ; Order Dt/Tm:   04/01/2021 14:57:34 EDT          morphine 2 mg/mL preservative-free Sol  :   morphine 2 mg/mL preservative-free Sol ; Status:   Ordered ; Ordered As Mnemonic:   morphine range dose ; Simple Display Line:   6  mg, 3 mL, IV Push, q4hr, PRN: severe pain (8-10) ; Ordering Provider:   Lenore Cordia; Catalog Code:   morphine ; Order Dt/Tm:   04/01/2021 14:57:34 EDT ; Comment:   RN may implement IM order if IV acces is lost.          ondansetron 4 mg Tab  :   ondansetron 4 mg Tab ; Status:   Ordered ; Ordered As Mnemonic:   ondansetron ; Simple Display Line:   4 mg, 1 tabs, Oral, q6hr, PRN: nausea/vomiting ;  Ordering Provider:   Lenore Cordia; Catalog Code:   ondansetron ; Order Dt/Tm:   04/01/2021 14:57:34 EDT ; Comment:   use ondansetron before promethazine if both are ordered          ondansetron 2 mg/mL Inj Soln 2 mL  :   ondansetron 2 mg/mL Inj Soln 2 mL ; Status:   Ordered ; Ordered As Mnemonic:   ondansetron ; Simple Display Line:   4 mg, 2 mL, IV Push, q6hr, PRN: nausea/vomiting ; Ordering Provider:   Lenore Cordia; Catalog Code:   ondansetron ; Order Dt/Tm:   04/01/2021 14:57:34 EDT ; Comment:   use ondansetron before promethazine if both are ordered          phenol 1.4% Topical Spray 177 mL  :   phenol 1.4% Topical Spray 177 mL ; Status:   Ordered ; Ordered As Mnemonic:   phenol 1.4% topical spray ; Simple Display Line:   5 sprays, Oral, q2hr, PRN: sore throat ; Ordering Provider:   Lenore Cordia; Catalog Code:   phenol topical ; Order Dt/Tm:   04/01/2021 14:57:34 EDT          promethazine 25 mg Rectal Supp  :   promethazine 25 mg Rectal Supp ; Status:   Ordered ; Ordered As Mnemonic:   promethazine range dose ; Simple Display Line:   12.5 mg, 0.5 supp, PR, q6hr, PRN: nausea/vomiting ; Ordering Provider:   Lenore Cordia; Catalog Code:   promethazine ; Order Dt/Tm:   04/01/2021 14:57:34 EDT ; Comment:   Use ondansetron before promethazine if both ordered. Lower doses of 6.25-12.5mg  are recommended for pts > 4 yo. " THIS MEDICATION IS ASSOCIATED   WITH   AN INCREASED RISK OF FALLS."          promethazine  :   promethazine ; Status:   Ordered ; Ordered As Mnemonic:   promethazine  range dose ; Simple Display Line:   12.5 mg, IM, q6hr, PRN: nausea/vomiting ; Ordering Provider:   Lenore Cordia; Catalog Code:   promethazine ; Order Dt/Tm:   04/01/2021 14:57:34 EDT ; Comment:   Use ondansetron before promethazine if both ordered. Lower doses of 6.25-12.5mg  are recommended for pts > 35 yo.  WHEN ADMINISTERING IV, ALWAYS DILUTE IN 9 ML NORMAL SALINE FOR A TOTAL VOLUME OF - [FINAL CONCENTRATION OF 2.5 MG/ML] AND PUSH OVER 1 MINUTE **DO NOT DILUTE FOR IM ADMINISTRATION** Use oral then rectal route first, if ordered.  "THIS MEDICATION IS ASSOCIATED WITH AN INCREASED RISK OF FALLS"          promethazine 25 mg Tab  :   promethazine 25 mg Tab ; Status:   Ordered ; Ordered As Mnemonic:   promethazine range dose ; Simple Display Line:   12.5 mg, 0.5 tabs, Oral, q6hr, PRN: nausea/vomiting ; Ordering Provider:   Lenore Cordia; Catalog Code:   promethazine ; Order Dt/Tm:   04/01/2021 14:57:34 EDT ; Comment:   Use ondansetron before promethazine if both ordered. Lower doses of 6.25-12.5mg  are recommended for pts > 35 yo. " THIS MEDICATION IS ASSOCIATED   WITH   AN INCREASED RISK OF FALLS."          simethicone 125 mg Chew Tab  :   simethicone 125 mg Chew Tab ; Status:   Ordered ; Ordered As Mnemonic:   simethicone ; Simple Display Line:   125 mg, 1 tabs, Oral,  QID, PRN: gas ; Ordering Provider:   Lenore Cordia; Catalog Code:   simethicone ; Order Dt/Tm:   04/01/2021 14:57:34 EDT ; Comment:   max dose 500 mg in 24 hours          fentaNYL 50 mcg/mL Inj Soln 2 mL  :   fentaNYL 50 mcg/mL Inj Soln 2 mL ; Status:   Discontinued ; Ordered As Mnemonic:   fentaNYL range dose - PACU only ; Simple Display Line:   50 mcg, 1 mL, IV Push, q30min, PRN: other (see comment) ; Ordering Provider:   Loyce Dys; Catalog Code:   fentaNYL ; Order Dt/Tm:   04/01/2021 13:12:11 EDT ; Comment:   For Pain  Max dose = 100 mcg Primary choice          HYDROmorphone 0.5 mg/0.5 mL Sol  :    HYDROmorphone 0.5 mg/0.5 mL Sol ; Status:   Discontinued ; Ordered As Mnemonic:   HYDROmorphone range dose - PACU only ; Simple Display Line:   0.5 mg, 0.5 mL, IV Push, q70min, PRN: other (see comment) ; Ordering Provider:   Loyce Dys; Catalog Code:   HYDROmorphone ; Order Dt/Tm:   04/01/2021 13:12:11 EDT ; Comment:   For Pain  Max dose = 2mg  For pain unrelieved by primary choice          ondansetron 2 mg/mL Inj Soln 2 mL  :   ondansetron 2 mg/mL Inj Soln 2 mL ; Status:   Discontinued ; Ordered As Mnemonic:   ondansetron - PACU only ; Simple Display Line:   4 mg, 2 mL, IV Push, Once, PRN: nausea/vomiting ; Ordering Provider:   Loyce Dys; Catalog Code:   ondansetron ; Order Dt/Tm:   04/01/2021 13:12:11 EDT ; Comment:   Primary choice          lidocaine 1% PF Inj Soln 2 mL  :   lidocaine 1% PF Inj Soln 2 mL ; Status:   Ordered ; Ordered As Mnemonic:   lidocaine 1% preservative-free injectable solution ; Simple Display Line:   0.25 mL, ID, q40min, PRN: other (see comment) ; Ordering Provider:   Demetrius Revel L-FNP; Catalog Code:   lidocaine ; Order Dt/Tm:   04/01/2021 07:20:17 EDT ; Comment:   to access lidocaine 1%  2 mL vial for IV start and Life Port access          sodium chloride 0.9% Inj Soln 10 mL syringe  :   sodium chloride 0.9% Inj Soln 10 mL syringe ; Status:   Ordered ; Ordered As Mnemonic:   10 mL sodium chloride 0.9% flush syringe range dose ; Simple Display Line:   30 mL, IV Push, q33min, PRN: other (see comment) ; Ordering Provider:   Demetrius Revel L-FNP; Catalog Code:   sodium chloride flush ; Order Dt/Tm:   04/01/2021 07:20:17 EDT          ceFAZolin 2 g/100 ml NS  :   ceFAZolin 2 g/100 ml NS ; Status:   Completed ; Ordered As Mnemonic:   ceFAZolin IVPB ; Simple Display Line:   2 g, 100 mL, 200 mL/hr, IV Piggyback, On Call ; Ordering Provider:   Lenore Cordia; Catalog Code:   ceFAZolin ; Order Dt/Tm:   03/04/2021 10:18:37 EDT            Home Meds     docusate  :   docusate ; Status:  Documented ; Ordered As Mnemonic:   Dulcolax Stool Softener 100 mg oral capsule ; Simple Display Line:   100 mg, 1 caps, Oral, BID, PRN: for constipation, 20 caps, 0 Refill(s) ; Catalog Code:   docusate ; Order Dt/Tm:   03/21/2021 10:53:57 EDT          diazepam  :   diazepam ; Status:   Documented ; Ordered As Mnemonic:   diazepam 10 mg oral tablet ; Simple Display Line:   10 mg, 1 tabs, Oral, TID, PRN: for anxiety, 0 Refill(s) ; Catalog Code:   diazepam ; Order Dt/Tm:   03/19/2021 11:28:27 EDT          cyclobenzaprine  :   cyclobenzaprine ; Status:   Documented ; Ordered As Mnemonic:   Flexeril ; Simple Display Line:   Oral, TID, 0 Refill(s) ; Catalog Code:   cyclobenzaprine ; Order Dt/Tm:   07/03/2020 13:36:37 EST          acetaminophen-oxyCODONE  :   acetaminophen-oxyCODONE ; Status:   Documented ; Ordered As Mnemonic:   Percocet 5/325 oral tablet ; Simple Display Line:   2 tabs, Oral, q4hr, PRN: severe pain (8-10), 0 Refill(s) ; Ordering Provider:   Alcide Clever M-MD; Catalog Code:   acetaminophen-oxyCODONE ; Order Dt/Tm:   09/06/2019 13:56:57 EDT            Problem History   (As Of: 04/01/2021 15:21:09 EDT)   Problems(Active)    Acute myelopathy (SNOMED CT  :1610960454 )  Name of Problem:   Acute myelopathy ; Recorder:   LEWEY,  JENNIFER L-FNP; Confirmation:   Confirmed ; Classification:   Medical ; Code:   0981191478 ; Contributor System:   Dietitian ; Last Updated:   08/24/2019 9:04 EDT ; Life Cycle Status:   Active ; Responsible Provider:   Demetrius Revel L-FNP; Vocabulary:   SNOMED CT        Back pain (SNOMED CT  :295621308 )  Name of Problem:   Back pain ; Recorder:   PYLE, RN, JOYCE; Confirmation:   Confirmed ; Classification:   Patient Stated ; Code:   [id number] ; Contributor System:   Dietitian ; Last Updated:   06/28/2019 11:25 EST ; Life Cycle Date:   06/28/2019 ; Life Cycle Status:   Active ; Vocabulary:   SNOMED CT        History of DVT of lower extremity  (SNOMED CT  :9528413244 )  Name of Problem:   History of DVT of lower extremity ; Recorder:   Demetrius Revel L-FNP; Confirmation:   Confirmed ; Classification:   Medical ; Code:   0102725366 ; Contributor System:   Dietitian ; Last Updated:   03/21/2021 11:03 EDT ; Life Cycle Date:   03/21/2021 ; Life Cycle Status:   Active ; Vocabulary:   SNOMED CT        OSA (obstructive sleep apnea) (SNOMED CT  :440347425 )  Name of Problem:   OSA (obstructive sleep apnea) ; Recorder:   PYLE, RN, JOYCE; Confirmation:   Confirmed ; Classification:   Patient Stated ; Code:   [id number] ; Contributor System:   PowerChart ; Last Updated:   06/28/2019 11:25 EST ; Life Cycle Date:   06/28/2019 ; Life Cycle Status:   Active ; Vocabulary:   SNOMED CT        Sciatica (SNOMED CT  :33295188 )  Name of Problem:   Sciatica ; Recorder:   PYLE, RN, JOYCE;  Confirmation:   Confirmed ; Classification:   Patient Stated ; Code:   [id number] ; Contributor System:   PowerChart ; Last Updated:   06/28/2019 11:25 EST ; Life Cycle Date:   06/28/2019 ; Life Cycle Status:   Active ; Vocabulary:   SNOMED CT          Diagnoses(Active)    Cervical myelopathy  Date:   03/04/2021 ; Diagnosis Type:   Discharge ; Confirmation:   Confirmed ; Clinical Dx:   Cervical myelopathy ; Classification:   Medical ; Clinical Service:   Non-Specified ; Code:   ICD-10-CM ; Probability:   0 ; Diagnosis Code:   G95.9        Procedure History        -    Procedure History   (As Of: 04/01/2021 15:21:09 EDT)     Anesthesia Minutes:   0 ; Procedure Name:   Hernia ; Procedure Minutes:   0            Anesthesia Minutes:   0 ; Procedure Name:   Tonsillectomy ; Procedure Minutes:   0            Procedure Dt/Tm:   06/29/2019 11:32:00 EST ; Location:   [facility] ; Provider:   Anda Latina; Anesthesia Type:   Local ; Anesthesia Minutes:   0 ; Procedure Name:   Transforaminal Epidural Ster Inj ; Procedure Minutes:   2 ; Comments:     06/29/2019 11:34 EST - [name], RN, [name] A   auto-populated from documented surgical case ; Clinical Service:   Surgery            Procedure Dt/Tm:   1995 ; Anesthesia Minutes:   0 ; Procedure Name:   thumb surgery ; Procedure Minutes:   0            Procedure Dt/Tm:   04/22/2011 ; Anesthesia Minutes:   0 ; Procedure Name:   Lumbar fusion L4-5 ; Procedure Minutes:   0            Procedure Dt/Tm:   3536 ; Anesthesia Minutes:   0 ; Procedure Name:   tonsils and adenoids ; Procedure Minutes:   0            Procedure Dt/Tm:   2012 ; Anesthesia Minutes:   0 ; Procedure Name:   redo lumbar fusion L5-S1 ; Procedure Minutes:   0            Anesthesia Minutes:   0 ; Procedure Name:   colonoscopy ; Procedure Minutes:   0            Procedure Dt/Tm:   1443 ; Anesthesia Minutes:   0 ; Procedure Name:   facial surgery ; Procedure Minutes:   0            Procedure Dt/Tm:   2005 ; Anesthesia Minutes:   0 ; Procedure Name:   L5-S1 lumbar fusion ; Procedure Minutes:   0            Procedure Dt/Tm:   08/26/2019 08:40:00 EDT ; Location:   [facility] ; Provider:   Alcide Goodness,  CURTIS-MD; Anesthesia Type:   General ; :   Huel Cote L-MD; Anesthesia Minutes:   0 ; Procedure Name:   Anterior Cervical Discectomy with Fusion and/or Stabilization SCIP ; Procedure Minutes:   166 ; Comments:     08/26/2019 11:38 EDT - [name],  RN, [name]  auto-populated from documented surgical case ; Clinical Service:   Surgery            Procedure Dt/Tm:   05/31/2020 11:05:00 EST ; Location:   [facility] ; Provider:   Rondall Allegra W-MD; Anesthesia Type:   Local ; Anesthesia Minutes:   0 ; Procedure Name:   Transforaminal Epidural Ster Inj ; Procedure Minutes:   6 ; Comments:     05/31/2020 11:13 EST - PYLE, RN, JOYCE  auto-populated from documented surgical case ; Clinical Service:   Surgery            Procedure Dt/Tm:   05/31/2020 11:05:00 EST ; Location:   [facility] ; Provider:   Rondall Allegra W-MD; Anesthesia Type:   Local ; Anesthesia Minutes:   0 ; Procedure Name:   Si  Joint Injection ; Procedure Minutes:   6 ; Comments:     05/31/2020 11:13 EST - PYLE, RN, JOYCE  auto-populated from documented surgical case ; Clinical Service:   Surgery            Procedure Dt/Tm:   07/03/2020 13:53:00 EST ; Location:   [facility] ; Provider:   Rondall Allegra W-MD; Anesthesia Type:   Local ; Anesthesia Minutes:   0 ; Procedure Name:   Lumbar Epidural Steroid Injection ; Procedure Minutes:   6 ; Comments:     07/03/2020 13:59 EST - [name], RN, [name] A  auto-populated from documented surgical case ; Clinical Service:   Surgery            Procedure Dt/Tm:   07/03/2020 13:53:00 EST ; Location:   [facility] ; Provider:   Rondall Allegra W-MD; Anesthesia Type:   Local ; Anesthesia Minutes:   0 ; Procedure Name:   Si Joint Injection (Bilateral) ; Procedure Minutes:   6 ; Comments:     07/03/2020 13:59 EST - [name], RN, [name] A  auto-populated from documented surgical case ; Clinical Service:   Surgery            Procedure Dt/Tm:   04/01/2021 09:21:00 EDT ; Location:   [facility] ; Provider:   Candi Leash C-MD; Anesthesia Type:   General ; :   Loyce Dys; Anesthesia Minutes:   0 ; Procedure Name:   Posterior Cervical Laminectomy/Discectomy/Decompression SCIP ; Procedure Minutes:   194 ; Comments:     04/01/2021 12:48 EDT - Etta Grandchild, RN, Brunilda Payor  auto-populated from documented surgical case ; Clinical Service:   Surgery            Immunizations   Influenza Vaccine Status :   Patient refused   Theda Belfast C-RN - 04/01/2021 15:19 EDT   ID Risk Screen Symptoms   Recent Travel History :   No recent travel   TB Symptom Screen :   No symptoms   Last 90 days COVID-19 ID :   No   Close Contact with COVID-19 ID :   No   Last 14 days COVID-19 ID :   No   C. diff Symptom/History ID :   Neither of the above   MRSA/VRE Screening :   None of these apply   CRE Screening :   No   Theda Belfast C-RN - 04/01/2021 15:19 EDT   Bloodless Medicine   Is Blood Transfusion Acceptable to Patient  :   Yes   Theda Belfast  C-RN - 04/01/2021 15:19 EDT   Nutrition   MST Does Your Current Diet Include :   None   Nutrition Screen for Malnutrition :   Patient denies   Theda Belfast C-RN - 04/01/2021 15:19 EDT   Functional   Sensory Deficits :   None   ADLs Prior to Admission :   Independent   Theda Belfast C-RN - 04/01/2021 15:19 EDT   Social History   Social History   (As Of: 04/01/2021 15:21:09 EDT)   Tobacco:        Tobacco use: Never (less than 100 in lifetime).   (Last Updated: 08/25/2019 09:36:30 EDT by Demetrius Revel L-FNP)          Electronic Cigarette/Vaping:        Never Electronic Cigarette Use.   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 :   No   Do you have any religious/spiritual/cultural beliefs that could impact the way your care is provided? :   No   Theda Belfast C-RN - 04/01/2021 15:19 EDT   Harm Screen   Suspect or Concern for: :   None   Feels Safe Where Live :   Yes   Last 3 mo, thoughts killing self/others :   Patient denies   Cognitively Impaired :   No   Ambulatory or Self Mobile in [facility] :   Yes   Theda Belfast C-RN - 04/01/2021 15:19 EDT   Advance Directive   Advance Directive :   Yes   Type of Advance Directive :   Living will, Medical durable power of attorney   Patient Wishes to Receive Further Information on Advance Directives :   No   Theda Belfast C-RN - 04/01/2021 15:19 EDT   Education   Written Language :   [place]   Primary Language :   Joaquim Nam C-RN -  04/01/2021 15:19 EDT   Caregiver/Advocate Language   Patient :   None   Family :   None   Theda Belfast C-RN - 04/01/2021 15:19 EDT   Barriers to Learning :   None evident   Teaching Method :   Demonstration, Explanation, Printed materials   Theda Belfast C-RN - 04/01/2021 15:19 EDT   Preventative Measures Information   Unit/Room Orientation :   [place] understanding   Environmental Safety :   [place] understanding   Hand Washing :   Verbalizes understanding   Infection Prevention :   Verbalizes understanding   DVT Prophylaxis :   Verbalizes understanding   Isolation Precaution :   Verbalizes understanding   Theda Belfast C-RN - 04/01/2021 15:19 EDT   DC Needs   CM Living Situation :  Home with no services, Family support   Home Caregiver Name/Relationship :   self  Wife   Anticipated Discharge Needs :   None   Theda Belfast C-RN - 04/01/2021 15:19 EDT   Valuables and Belongings   Does Patient Have Valuables and Belongings :   Yes   Theda Belfast C-RN - 04/01/2021 15:19 EDT   Valuables and Belongings   At Bedside :   Clothes, Lucien Mons C-RN - 04/01/2021 15:19 EDT   Admission Complete   Admission Complete :   Yes   Theda Belfast C-RN - 04/01/2021 15:19 EDT

## 2021-04-01 NOTE — Op Note (Signed)
Operative Report    Patient Name: Jesse Savage, Jesse Savage.  Patient DOB: [birth year]    Jesse Savage - Andrey Farmer, MD  Service Date: 04/01/2021    INDICATION FOR OPERATION:  Continued gait disturbance and upper   extremity weakness.    PREOPERATIVE DIAGNOSIS:  Cervical myelopathy, status post ACDF at C5-C6,  ossification of posterior longitudinal ligament.    POSTOPERATIVE DIAGNOSIS:  Cervical myelopathy, status post ACDF at   C5-C6, ossification of posterior longitudinal ligament.    OPERATION:  C5-C6 bilateral laminectomy for decompression of spinal   cord, microdissection with use of the operating microscope.    SURGEON:  Dr. Alcide Goodness.    ASSISTANTEarlene Plater, NP.    ANESTHESIA:  General, Dr. Berton Lan.    ESTIMATED BLOOD LOSS:  100 mL    FINDINGS:  Unremarkable lamina and dural decompression.    COMPLICATIONS:  None.    SPECIMENS:  None.    DESCRIPTION OF PROCEDURE:  Please note that this operation was carried   out with a electrophysiologic monitoring including motor and sensory   evoked potentials.  Note that the ERAS protocol was followed.  Note that  the nurse practitioner, first assistant, was present through the   entirety of the case and was essential to the successful outcome of the   case.  The patient after induction of general endotracheal anesthesia,   was placed in skeletal fixation.  Very careful turning was carried out   to place the patient prone on bolsters.  The head was fixed in the   skeletal fixation in good position.  Lateral C-arm fluoroscopy was used   through the entire case.  The posterior neck was prepped, infiltrated   with local anesthesia and draped into a sterile field.  A skin incision   was made over the C4 through C7 segments as determined by fluoroscopy.    The incision was carried down through the subcutaneous tissues.    Dissection was carried out using cautery along the avascular midline   (ligamentum nuchae) and carried down to the spinous  processes.  The   muscle bundles were dissected away from the spinous process and lamina   and self-retaining retractors were placed.  The C5 and C6 spinous   processes and the levels were clearly identified.  The spinous processes  were bitten off using a Leksell rongeur.  The spinal canal was then   entered using a 2 mm Kerrison, affecting a midline laminectomy.  The   dura was visible.  Further thinning of the bone was carried out using   the drill.  Microscope was brought into the operative field.  Majority   of the dissection was carried out under the microscope.  Lamina was   removed from inferior to superior and medial to lateral in a methodical   fashion and without difficulty or complication.  An excellent dural   decompression was accomplished through the entire C5 and C6 segments   with a good lateral laminectomy accomplished.  Bipolar cautery was used   to coagulate epidural veins.  Bone wax was used to wax the bleeding bone  along the edges.  Some Gelfoam was left in the lateral gutters and   FloSeal placed after copious irrigation with antibiotic solution.    Again, monitoring was carried out through the entire procedure.  The   fascia was closed using 0 Vicryl.  The subcutaneous tissue was closed   using 2-0 Vicryl and the skin  closed using 2-0 nylon in a running   locking stitch.  Sterile dressing was applied.  The patient was again   carefully turned supine and the skeletal fixation was removed and the   patient transferred to the recovery room.      Jesse Casco, MD  TR: th DD: 04/01/2021 13:01 TD: 04/01/2021 13:19  Job#: [id number] DOC#: [id number]  Signature Line    Electronically Signed on 04/01/2021 03:15 PM EDT  ________________________________________________  Jesse Savage C-MD

## 2021-04-01 NOTE — Anesthesia Pre-Procedure Evaluation (Signed)
Preanesthesia Evaluation        Patient:   Jesse Savage, Jesse Savage            MRN: [id number]            FIN: [id number]               Age:   60 years     Sex:  Male     DOB:  08/31/1960   Associated Diagnoses:   None   Author:   Jillyn Ledger [facility]      Preoperative Information   NPO:  NPO greater than 8 hours.    Anesthesia history     Patient's history: negative.     Family's history: negative.        Health Status   Allergies:    Allergic Reactions (All)  Unknown  Codeine- Na.  Cymbalta- Na.  Neurontin- Na.   Current medications:    Home Medications (4) Active  diazepam 10 mg oral tablet 10 mg = 1 tabs, PRN, Oral, TID  Dulcolax Stool Softener 100 mg oral capsule 100 mg = 1 caps, PRN, Oral, BID  Flexeril , Oral, TID  Percocet 5/325 oral tablet 2 tabs, PRN, Oral, q4hr  ,    No qualifying data available     Problem list:    Active Problems (5)  Acute myelopathy   Back pain   History of DVT of lower extremity   OSA (obstructive sleep apnea)   Sciatica         Histories   Past Medical History:    No active or resolved past medical history items have been selected or recorded.   Procedure history:    Lumbar Epidural Steroid Injection on 07/03/2020 at 59 Years.  Comments:  07/03/2020 13:59 EST - [name], RN, [name] A  auto-populated from documented surgical case  Si Joint Injection (Bilateral) on 07/03/2020 at 59 Years.  Comments:  07/03/2020 13:59 EST - [name], RN, [name] A  auto-populated from documented surgical case  Transforaminal Epidural Ster Inj on 05/31/2020 at 59 Years.  Comments:  05/31/2020 11:13 EST - PYLE, RN, JOYCE  auto-populated from documented surgical case  Si Joint Injection on 05/31/2020 at 59 Years.  Comments:  05/31/2020 11:13 EST - PYLE, RN, JOYCE  auto-populated from documented surgical case  Anterior Cervical Discectomy with Fusion and/or Stabilization SCIP on 08/26/2019 at 58 Years.  Comments:  08/26/2019 11:38 EDT - [name], RN, [name]  auto-populated from documented surgical case  Transforaminal  Epidural Ster Inj on 06/29/2019 at 58 Years.  Comments:  06/29/2019 11:34 EST - [name], RN, [name] A  auto-populated from documented surgical case  Lumbar fusion L4-5 on 04/22/2011 at 50 Years.  redo lumbar fusion L5-S1 in 2012 at 50 Years.  L5-S1 lumbar fusion in 2005 at 43 Years.  thumb surgery in 1995 at 33 Years.  facial surgery in 1981 at 19 Years.  tonsils and adenoids in 1972 at 10 Years.  Hernia (6644034742).  Tonsillectomy (595638756).  colonoscopy.   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  PONV Risk Score: PAT Documentation: 2                        09/05/2020 10:31 EDT        Physical Examination      No qualifying data available   Airway:       Mallampati classification: II (soft palate, fauces, uvula visible).    Respiratory:  Lungs are clear to auscultation.    Cardiovascular:  Normal rate.       Review / Management   Results review:     No qualifying data available, Lab results   03/21/2021 11:32 EDT WBC 5.1 x10e3/mcL    RBC 4.98 x10e6/mcL    Hgb 14.7 g/dL    HCT 42.1 %    MCV 71.5 fL    MCH 29.5 pg    MCHC 33.0 g/dL    RDW 57.9 %    Platelet 285 x10e3/mcL    MPV 9.8 fL    NRBC Absolute Auto 0.000 x10e3/mcL    NRBC Percent Auto 0.0 %   .       Assessment and Plan   American Society of Anesthesiologists#(ASA) physical status classification:  Class III.    Anesthetic Preoperative Plan     Anesthetic technique: General anesthesia, glidescope intubation   cervical myelopathy   aline plus extra iv.     Maintenance airway: Oral endotracheal tube.       Signature Line     Electronically Signed on 04/01/2021 06:43 AM EDT   ________________________________________________   Loyce Dys

## 2021-04-02 NOTE — Progress Notes (Signed)
 Inpatient OT Evaluation - Text       Inpatient OT Evaluation Entered On:  04/02/2021 10:30 EDT    Performed On:  04/02/2021 10:19 EDT by AMMONS, OT, STEPHEN               Reason for Treatment   Subjective Statement :   Evaluation complete     *Reason for Referral :   s/p PCDF     *Chief Complaint :   Pain     AMMONS, OT, STEPHEN - 04/02/2021 10:19 EDT   General Information   Occupational Therapy Orders :   Occupational Therapy Outpatient Additional Treatment Rehab - 10/17/19 13:00:00 EDT, 10/17/19 13:00:00 EDT     Precautions RTF :   Precaution Orders   No qualifying data available.     Pain Present :   Yes actual or suspected pain   Orientation Assessment :   Oriented x 4   Affect/Behavior :   Cooperative, Other: Painful   AMMONS, OT, STEPHEN - 04/02/2021 10:19 EDT   Problem List   (As Of: 04/02/2021 10:30:04 EDT)   Problems(Active)    Acute myelopathy (SNOMED CT  :8784240981 )  Name of Problem:   Acute myelopathy ; Recorder:   LEWEY,  JENNIFER L-FNP; Confirmation:   Confirmed ; Classification:   Medical ; Code:   8784240981 ; Contributor System:   PowerChart ; Last Updated:   08/24/2019 9:04 EDT ; Life Cycle Status:   Active ; Responsible Provider:   RODOLFO NEST L-FNP; Vocabulary:   SNOMED CT        Back pain (SNOMED CT  :747688984 )  Name of Problem:   Back pain ; Recorder:   PYLE, RN, JOYCE; Confirmation:   Confirmed ; Classification:   Patient Stated ; Code:   747688984 ; Contributor System:   PowerChart ; Last Updated:   06/28/2019 11:25 EST ; Life Cycle Date:   06/28/2019 ; Life Cycle Status:   Active ; Vocabulary:   SNOMED CT        History of DVT of lower extremity (SNOMED CT  :7013061988 )  Name of Problem:   History of DVT of lower extremity ; Recorder:   RODOLFO NEST L-FNP; Confirmation:   Confirmed ; Classification:   Medical ; Code:   7013061988 ; Contributor System:   PowerChart ; Last Updated:   03/21/2021 11:03 EDT ; Life Cycle Date:   03/21/2021 ; Life Cycle Status:   Active ; Vocabulary:    SNOMED CT        OSA (obstructive sleep apnea) (SNOMED CT  :870110984 )  Name of Problem:   OSA (obstructive sleep apnea) ; Recorder:   PYLE, RN, JOYCE; Confirmation:   Confirmed ; Classification:   Patient Stated ; Code:   870110984 ; Contributor System:   PowerChart ; Last Updated:   06/28/2019 11:25 EST ; Life Cycle Date:   06/28/2019 ; Life Cycle Status:   Active ; Vocabulary:   SNOMED CT        Sciatica (SNOMED CT  :61272986 )  Name of Problem:   Sciatica ; Recorder:   PYLE, RN, JOYCE; Confirmation:   Confirmed ; Classification:   Patient Stated ; Code:   61272986 ; Contributor System:   PowerChart ; Last Updated:   06/28/2019 11:25 EST ; Life Cycle Date:   06/28/2019 ; Life Cycle Status:   Active ; Vocabulary:   SNOMED CT          Diagnoses(Active)  Cervical myelopathy  Date:   03/04/2021 ; Diagnosis Type:   Discharge ; Confirmation:   Confirmed ; Clinical Dx:   Cervical myelopathy ; Classification:   Medical ; Clinical Service:   Non-Specified ; Code:   ICD-10-CM ; Probability:   0 ; Diagnosis Code:   G95.9      Cervicalgia  Date:   04/02/2021 ; Diagnosis Type:   Other ; Confirmation:   Differential ; Clinical Dx:   Cervicalgia ; Classification:   Interdisciplinary ; Clinical Service:   Non-Specified ; Code:   ICD-10-CM ; Probability:   0 ; Diagnosis Code:   M54.2        Pain Assessment   Pain Present :   Yes actual or suspected pain   Pain Present :   Neck   Quality :   Aching, Radiating   Self Report Pain :   Numeric rating scale   Numeric Pain Scale :   10 = Worst possible pain   Numeric Pain Score :   10    AMMONS, OT, STEPHEN - 04/02/2021 10:19 EDT   OT Basic ADL   Basic ADL Grid   Eating :   Supervision or setup   Grooming :   Supervision or setup   Bathing :   Moderate assistance   UE Dressing :   Minimal contact assistance   LE Dressing :   Moderate assistance   AMMONS, OT, STEPHEN - 04/04/2021 9:02 EDT   Limiting Factors :   Motor, Pain, Other: Post op restrictions   AMMONS, OT, STEPHEN - 04/04/2021  9:02 EDT   Mobility   Mobility Grid   Roll Left :   Rehab Minimal assistance   Roll Right :   Rehab Minimal assistance   Supine to Sit :   Rehab Moderate assistance   Sit to Supine :   Rehab Moderate assistance   Edge of Bed Assist :   Rehab Contact guard assistance   Transfer Sit to Stand :   Rehab Moderate assistance   Transfer Stand to Sit :   Rehab Moderate assistance   AMMONS, OT, STEPHEN - 04/02/2021 10:19 EDT   UE Strength/ROM   Upper Extremity Overall ROM Grid   Left Upper Extremity Passive Range :   Within functional limits   Left Upper Extremity Active Range :   Within functional limits   Right Upper Extremity Passive Range :   Within functional limits   Right Upper Extremity Active Range :   Within functional limits   AMMONS, OT, STEPHEN - 04/02/2021 10:19 EDT   Left Upper Extremity Strength Grid   Scapular Elevation :   5   Shoulder Flexion :   5   Shoulder Extension :   5   Shoulder Abduction :   5   Shoulder Adduction :   5   Shoulder External Rotation :   5   Shoulder Internal Rotation :   5   Elbow Flexion :   5   Elbow Extension :   5   Forearm Pronation :   5   Forearm Supination :   5   Wrist Flexion :   5   Wrist Extension :   5   Finger Flexion :   5   Finger Extension :   5   AMMONS, OT, STEPHEN - 04/02/2021 10:19 EDT   Right Upper Extremity Strength Grid   Scapular Elevation :   5   Shoulder Flexion :  5   Shoulder Extension :   5   Shoulder Abduction :   5   Shoulder Adduction :   5   Shoulder External Rotation :   5   Shoulder Internal Rotation :   5   Elbow Flexion :   5   Elbow Extension :   5   Forearm Pronation :   5   Forearm Supination :   5   Wrist Flexion :   5   Wrist Extension :   5   Finger Flexion :   5   Finger Extension :   5   AMMONS, OT, STEPHEN - 04/02/2021 10:19 EDT   UE Sensation   Left Sensation Grid   Light Touch :   Intact   AMMONS, OT, STEPHEN - 04/02/2021 10:19 EDT   Right Sensation Grid   Light Touch :   Impaired   AMMONS, OT, STEPHEN - 04/02/2021 10:19 EDT   UE  Coordination   Left :   21.60   Right :   20.87   AMMONS, OT, STEPHEN - 04/02/2021 10:19 EDT   Left Upper Extremity Coordination Grid   Finger Opposition :   Within functional limits   AMMONS, OT, STEPHEN - 04/02/2021 10:19 EDT   Right Upper Extremty Coordination Grid   Finger Opposition :   Within functional limits   AMMONS, OT, STEPHEN - 04/02/2021 10:19 EDT   Long Term Goals   OT LT Goals Reviewed :   Yes   AMMONS, OT, STEPHEN - 04/02/2021 10:19 EDT   Outpatient OT Long Term Goals Rehab     Long Term Goal 1  Long Term Goal 2  Long Term Goal 3      Goal :    Maximize Indep with UE/LE ADLs   Maximize Indep with toilet t/f   Maximize B UE strength          AMMONS, OT, STEPHEN - 04/02/2021 10:19 EDT  AMMONS, OT, STEPHEN - 04/02/2021 10:19 EDT  AMMONS, OT, STEPHEN - 04/02/2021 10:19 EDT       Short Term Goals   OT ST Goals Reviewed :   Yes   AMMONS, OT, STEPHEN - 04/02/2021 10:19 EDT   Plan   OT Evaluation Date :   04/02/2021 EDT   OT Frequency Acute :   Mo/Tu/We/Th/Fr   Duration :   10    Duration Unit :   Days   Estimated Hours Per Day :   Other: 15 mins per day   Planned Treatments :   Basic Activities of Daily Living, HEP, Manual therapy, Mobility training, Neuromuscular reeducation, Pain management, Patient education, Safety education, Therapeutic activities, Therapeutic exercises, Therapeutic exercises for strengthening and ROM   Treatment Plan/Goals Established With Patient/Caregiver :   Yes   OT Evaluation Complete :   Yes   AMMONS, OT, STEPHEN - 04/02/2021 10:19 EDT   Time Spent With Patient   OT Individual Eval Time, Low Complexity :   10 minutes   OT Evaluation Units, Low Complexity :   1 Unit   OT Total Untimed Code Treatment Minutes :   10 minutes   OT Total Treatment Time :   10 minutes   AMMONS, OT, STEPHEN - 04/02/2021 10:19 EDT   AM-PAC Daily Activity   AM-PAC Daily Activity   Putting on and taking off regular lower body clothing? :   A lot = 2 points   Bathing (including washing, rinsing, drying)? :  A lot = 2 points   Toileting , which includes using toilet, bedpan or urinal? :   A lot = 2 points   Putting on and taking off regular upper body clothing? :   A lot = 2 points   Taking care of personal grooming such as brushing teeth? :   A little = 3 points   Eating meals? :   A little = 3 points   AMMONS, OT, STEPHEN - 04/02/2021 10:19 EDT   AM-PAC Daily Activity Raw Score :   14    AMMONS, OT, STEPHEN - 04/02/2021 10:19 EDT   Assessment   OT Impairments or Limitations :   Basic activity of daily living deficits, Mobility deficits, Pain, Sensory deficits, Strength deficits, Other: Post op restrictions.   Barriers to Safe Discharge OT :   Severity of deficits   OT Discharge Recommendations :   Pt would benefit from acute rehab upon d/c.     OT Treatment Recommendations :   Pt limited by reported 10/10 pain in neck and R UE.  Pt very focused on receiving more pain medication, but does not demonstrate pain behaviors.  Pt able to stand bedside for approximately 30 secs and began yelling no, no and immediately returned to sidelying.     AMMONS, OT, STEPHEN - 04/02/2021 10:19 EDT

## 2021-04-02 NOTE — Progress Notes (Signed)
 Progress Note-Nurse                 Patient found to have chewing tobacco in possession on 10/24 by this RN. Patient was educated on not using due to hospital policies and the effect on healing of fusion. patient agreed to not use. This RN made night nurse aware and NOC RN reported patient was found to have again and actively using. NOC RN reported that she let Dr. Viki Know and patient tobacco was placed near sink in room to keep away from patient but in his sight as requested. I came onto shift and patient requested that  wrap it up in a paper towel and throw it in trash so no higher up sees it. I did as patient requested and reeducated on reason it was not acceptable to do to promote healing and abide by policies in place. patient adamant he has done it during all of his hospital stays. Luke Moats NP rounded and was made aware to the past afternoon and evening events. Reports she will also educated patient during rounds.       Signature Line     Electronically Signed on 04/02/2021 08:24 AM EDT   ________________________________________________   Gaither Sensor C-RN

## 2021-04-02 NOTE — Progress Notes (Signed)
Outpatient PT Certification Letter-Text       PT Outpatient Certification Letter Entered On:  04/02/2021 10:36 EDT    Performed On:  04/02/2021 10:36 EDT by Cleda Clarks, LAURA A               Physician Certification   Date of Injury or Surgery :   04/01/2021 EDT   Ordering Physician Name :   Candi Leash C-MD   Number of Visits This Interval :   1   Dear Physician :   Thank you for your referral. Below is the patient information for the stated interval of treatment. Please review, modify (if necessary), sign, and return. Thank you.   Date of Evaluation :   04/02/2021 EDT   PT Certification Interval End :   05/03/2021 EST   Physician Signature Required :   Yes   Staff Physician Signature :   The physician's electronic signature noted above indicates approval of the documented Plan of Care for the stated interval.   HALL, PT, LAURA A - 04/02/2021 10:36 EDT   Problem List   (As Of: 04/02/2021 10:36:30 EDT)   Problems(Active)    Acute myelopathy (SNOMED CT  :0347425956 )  Name of Problem:   Acute myelopathy ; Recorder:   LEWEY,  JENNIFER L-FNP; Confirmation:   Confirmed ; Classification:   Medical ; Code:   3875643329 ; Contributor System:   Dietitian ; Last Updated:   08/24/2019 9:04 EDT ; Life Cycle Status:   Active ; Responsible Provider:   Demetrius Revel L-FNP; Vocabulary:   SNOMED CT        Back pain (SNOMED CT  :518841660 )  Name of Problem:   Back pain ; Recorder:   PYLE, RN, JOYCE; Confirmation:   Confirmed ; Classification:   Patient Stated ; Code:   630160109 ; Contributor System:   Dietitian ; Last Updated:   06/28/2019 11:25 EST ; Life Cycle Date:   06/28/2019 ; Life Cycle Status:   Active ; Vocabulary:   SNOMED CT        History of DVT of lower extremity (SNOMED CT  :3235573220 )  Name of Problem:   History of DVT of lower extremity ; Recorder:   Demetrius Revel L-FNP; Confirmation:   Confirmed ; Classification:   Medical ; Code:   2542706237 ; Contributor System:   Dietitian ; Last Updated:    03/21/2021 11:03 EDT ; Life Cycle Date:   03/21/2021 ; Life Cycle Status:   Active ; Vocabulary:   SNOMED CT        OSA (obstructive sleep apnea) (SNOMED CT  :628315176 )  Name of Problem:   OSA (obstructive sleep apnea) ; Recorder:   PYLE, RN, JOYCE; Confirmation:   Confirmed ; Classification:   Patient Stated ; Code:   160737106 ; Contributor System:   PowerChart ; Last Updated:   06/28/2019 11:25 EST ; Life Cycle Date:   06/28/2019 ; Life Cycle Status:   Active ; Vocabulary:   SNOMED CT        Sciatica (SNOMED CT  :26948546 )  Name of Problem:   Sciatica ; Recorder:   PYLE, RN, JOYCE; Confirmation:   Confirmed ; Classification:   Patient Stated ; Code:   27035009 ; Contributor System:   PowerChart ; Last Updated:   06/28/2019 11:25 EST ; Life Cycle Date:   06/28/2019 ; Life Cycle Status:   Active ; Vocabulary:   SNOMED CT  Diagnoses(Active)    Cervical myelopathy  Date:   03/04/2021 ; Diagnosis Type:   Discharge ; Confirmation:   Confirmed ; Clinical Dx:   Cervical myelopathy ; Classification:   Medical ; Clinical Service:   Non-Specified ; Code:   ICD-10-CM ; Probability:   0 ; Diagnosis Code:   G95.9      Cervicalgia  Date:   04/02/2021 ; Diagnosis Type:   Other ; Confirmation:   Differential ; Clinical Dx:   Cervicalgia ; Classification:   Interdisciplinary ; Clinical Service:   Non-Specified ; Code:   ICD-10-CM ; Probability:   0 ; Diagnosis Code:   M54.2      Other abnormalities of gait and mobility  Date:   04/02/2021 ; Diagnosis Type:   Other ; Confirmation:   Differential ; Clinical Dx:   Other abnormalities of gait and mobility ; Classification:   Interdisciplinary ; Clinical Service:   Non-Specified ; Code:   ICD-10-CM ; Probability:   0 ; Diagnosis Code:   R26.89        Plan   PT Anticipated Treatments, Needs :   Balance training, Bed mobility training, Functional training, Gait training, Stair training, Therapeutic activities, Therapeutic exercises, Transfer training   PT Certification Letter  Complete :   Yes   HALL, PT, LAURA A - 04/02/2021 10:36 EDT   Outpatient Review   *Clinical Assessment Summary :   Pt has impairments with functional mobility and ambulation. Pt would benefit from acute skilled PT services for bed mobility, transfer and gait training. Rec d/c to acute rehab.     HALL, PT, LAURA A - 04/02/2021 10:36 EDT   Prior Functional Level Grid   ADL :   Independent   Mobility :   Independent   HALL, PT, LAURA A - 04/02/2021 10:36 EDT   PT Impairments or Limitations :   Ambulation deficits, Balance deficits, Bed mobility deficits, Pain limiting function, Strength deficits, Transfer deficits   HALL, PT, LAURA A - 04/02/2021 10:36 EDT      Signature Line                                                 Electronically Signed On 04/02/21 10:36 AM                                               _________________________________________________                                               Cleda Clarks, LAURA A                                                     Electronically Signed On 04/02/21 01:31 PM  _________________________________________________                                                Candi Leash                   Dicatation Date: 04/02/21 10:36 AM

## 2021-04-02 NOTE — Progress Notes (Signed)
 Inpatient PT Examination - Text       Inpatient PT Examination Entered On:  04/02/2021 10:35 EDT    Performed On:  04/02/2021 10:30 EDT by SHONA, PT, LAURA A               Reason for Treatment   Subjective Statement :   Pt agreeable to OOB/amb I can't I'm in 10/10 pain     *Reason for Referral :   s/p C5-6 PCDF    fall risk, cervical precautions; PMH- chronic pain     HALL, PT, LAURA A - 04/02/2021 10:30 EDT   General Info   Physical Therapy Orders :   PT Evaluation and Treatment Acute - 04/01/21 14:57:00 EDT, Stop date 04/01/21 14:57:00 EDT, A consult cannot be completed if there is a bedrest activity order; check for bedrest order.     Precautions RTF :    Notify Provider, 04/01/21 14:57:00 EDT, If indwelling catheter is placed due to inability to void and bladder scan volume = after 1st in and out catheterization, 04/01/21 14:57:00 EDT, 04/01/21 14:57:00 EDT, Ordered   Notify Provider, 04/01/21 14:57:00 EDT, If the patient has symptoms that are unrelieved by in and out catheterization or has bleeding at any time, notify the physician for additional orders., 04/01/21 14:57:00 EDT, 04/01/21 14:57:00 EDT, Ordered   Notify Provider, 04/01/21 14:57:00 EDT, Any Change In Patient's Neurological Status, 04/01/21 14:57:00 EDT, 04/01/21 14:57:00 EDT, Ordered   Notify Provider, 04/01/21 14:57:00 EDT, if patient unable to void, bladder scan residual volume is greater than , and indwelling foley placed, 04/01/21 14:57:00 EDT, 04/01/21 14:57:00 EDT, Ordered   Notify Provider Vital Signs, 04/01/21 14:57:00 EDT, Notify Provider Vital Signs for Temp greater than 101.5 degree F, 38.6 degree C, 04/01/21 14:57:00 EDT, Ordered   Notify Provider Vital Signs, 04/01/21 14:57:00 EDT, O2 sat less than 92, 04/01/21 14:57:00 EDT, Ordered   Notify Provider Vital Signs, 04/01/21 14:57:00 EDT, SBP greater than 180, SBP less than 90, 04/01/21 14:57:00 EDT, Ordered   Notify Provider Vital Signs, 04/01/21 14:57:00 EDT, RR greater  than 24, RR less than 10, 04/01/21 14:57:00 EDT, Ordered   Notify Provider Vital Signs, 04/01/21 14:57:00 EDT, HR greater than 120, HR less than 60, 04/01/21 14:57:00 EDT, Ordered   Notify Rapid Response Team, 04/01/21 14:57:00 EDT, For concerns regarding patient condition & notify MD, 04/01/21 14:57:00 EDT, 04/01/21 14:57:00 EDT, Ordered   Communication to Nursing, 04/01/21 7:19:00 EDT, Start additional peripheral IV for all Dr. Pedro Lumbar Fusions. Also for [facility] Anterior Lumbar Fusions. 18 gauage if Possiable., 04/01/21 7:19:00 EDT, 04/01/21 7:19:00 EDT, Ordered   Communication to Nursing, 04/01/21 7:19:00 EDT, NPO: Per Anesthesia Protocol, 04/01/21 7:19:00 EDT, 04/01/21 7:19:00 EDT, Ordered   Notify Rapid Response Team, 04/01/21 7:19:00 EDT, For concerns regarding patient condition & notify MD, 04/01/21 7:19:00 EDT, 04/01/21 7:19:00 EDT, Ordered   Obtain consent form, 04/01/21 7:19:00 EDT, POSTERIOR CERVICAL LAMINECTOMY, C5/6, 04/01/21 7:19:00 EDT, Cervical myelopathy, Ordered     Orientation Assessment :   Oriented x 4   Affect/Behavior :   Appropriate, Anxious   Basic Command Following :   Intact   Safety/Judgment :   Intact   Pain Present :   Yes actual or suspected pain   HALL, PT, LAURA A - 04/02/2021 10:30 EDT   Problem List   (As Of: 04/02/2021 10:35:44 EDT)   Problems(Active)    Acute myelopathy (SNOMED CT  :8784240981 )  Name of Problem:   Acute myelopathy ; Recorder:  LEWEY,  JENNIFER L-FNP; Confirmation:   Confirmed ; Classification:   Medical ; Code:   8784240981 ; Contributor System:   PowerChart ; Last Updated:   08/24/2019 9:04 EDT ; Life Cycle Status:   Active ; Responsible Provider:   RODOLFO NEST L-FNP; Vocabulary:   SNOMED CT        Back pain (SNOMED CT  :747688984 )  Name of Problem:   Back pain ; Recorder:   PYLE, RN, JOYCE; Confirmation:   Confirmed ; Classification:   Patient Stated ; Code:   747688984 ; Contributor System:   PowerChart ; Last Updated:   06/28/2019 11:25 EST ;  Life Cycle Date:   06/28/2019 ; Life Cycle Status:   Active ; Vocabulary:   SNOMED CT        History of DVT of lower extremity (SNOMED CT  :7013061988 )  Name of Problem:   History of DVT of lower extremity ; Recorder:   RODOLFO NEST L-FNP; Confirmation:   Confirmed ; Classification:   Medical ; Code:   7013061988 ; Contributor System:   PowerChart ; Last Updated:   03/21/2021 11:03 EDT ; Life Cycle Date:   03/21/2021 ; Life Cycle Status:   Active ; Vocabulary:   SNOMED CT        OSA (obstructive sleep apnea) (SNOMED CT  :870110984 )  Name of Problem:   OSA (obstructive sleep apnea) ; Recorder:   PYLE, RN, JOYCE; Confirmation:   Confirmed ; Classification:   Patient Stated ; Code:   870110984 ; Contributor System:   PowerChart ; Last Updated:   06/28/2019 11:25 EST ; Life Cycle Date:   06/28/2019 ; Life Cycle Status:   Active ; Vocabulary:   SNOMED CT        Sciatica (SNOMED CT  :61272986 )  Name of Problem:   Sciatica ; Recorder:   PYLE, RN, JOYCE; Confirmation:   Confirmed ; Classification:   Patient Stated ; Code:   61272986 ; Contributor System:   PowerChart ; Last Updated:   06/28/2019 11:25 EST ; Life Cycle Date:   06/28/2019 ; Life Cycle Status:   Active ; Vocabulary:   SNOMED CT          Diagnoses(Active)    Cervical myelopathy  Date:   03/04/2021 ; Diagnosis Type:   Discharge ; Confirmation:   Confirmed ; Clinical Dx:   Cervical myelopathy ; Classification:   Medical ; Clinical Service:   Non-Specified ; Code:   ICD-10-CM ; Probability:   0 ; Diagnosis Code:   G95.9      Cervicalgia  Date:   04/02/2021 ; Diagnosis Type:   Other ; Confirmation:   Differential ; Clinical Dx:   Cervicalgia ; Classification:   Interdisciplinary ; Clinical Service:   Non-Specified ; Code:   ICD-10-CM ; Probability:   0 ; Diagnosis Code:   M54.2      Other abnormalities of gait and mobility  Date:   04/02/2021 ; Diagnosis Type:   Other ; Confirmation:   Differential ; Clinical Dx:   Other abnormalities of gait and mobility ;  Classification:   Interdisciplinary ; Clinical Service:   Non-Specified ; Code:   ICD-10-CM ; Probability:   0 ; Diagnosis Code:   R26.89        Pain Assessment   Pain Present :   Yes actual or suspected pain   Pain Present :   Shoulder   Laterality :   Right  Self Report Pain :   Numeric rating scale   HALL, PT, LAURA A - 04/02/2021 10:30 EDT   Pain Assessment Detail   Interventions Implemented :   Medications, Repositioning, Rest   OT Pain Notification :   Notified nursing   SHONA ALMETA DOFFING A - 04/02/2021 10:30 EDT   Home Environment   Living Environment :   Home Environment  Sensory Deficits:  None  Performed By:  Gaither Sensor C-RN 04/01/2021     HALL, PT, LAURA A - 04/02/2021 10:30 EDT   Home Environment II   Living Environment :   Home Environment  Sensory Deficits:  None  Performed By:  Gaither Sensor C-RN 04/01/2021     Devices/Equipment :   [place] SHONA, PT, LAURA A - 04/02/2021 10:30 EDT   Prior Functional Status   ADL :   Independent   Mobility :   Independent   HALL, PT, LAURA A - 04/02/2021 10:30 EDT   LE Range/Strength   LE Overall Range of Motion Grid   Left Lower Extremity Passive Range :   Within functional limits   Left Lower Extremity Active Range :   Within functional limits   Right Lower Extremity Passive Range :   Within functional limits   Right Lower Extremity Active Range :   Within functional limits   HALL, PT, LAURA A - 04/02/2021 10:30 EDT   Lt Lower Extremity Strength :   Within functional limits   Rt Lower Extremity Strength :   Within functional limits   HALL, PT, LAURA A - 04/02/2021 10:30 EDT   Mobility   Mobility Grid   Supine to Sit :   2, Rehab Moderate assistance, Rehab Minimal assistance   (Comment: required A with upper body and wanted someone to hold/support his head [HALL, PT, LAURA A - 04/02/2021 10:30 EDT] )   Sit to Supine :   1, Rehab Minimal assistance   Scooting :   1, Rehab Minimal assistance   Edge of Bed Assist :   1, Rehab Minimal assistance   Transfer Sit to Stand :    2, Rehab Minimal assistance, Rehab Moderate assistance   Transfer Stand to Sit :   Rehab Moderate assistance, Rehab Minimal assistance, 2   HALL, PT, LAURA A - 04/02/2021 10:30 EDT   Ambulation Level Acute :   Does not occur   Ambulation Quality :   pt in too much pain to reamin standing, quickly sat back EOB and returned to supine   HALL, PT, LAURA A - 04/02/2021 10:30 EDT   Balance   Balance Tests Performed :   Other: fair sitting and standing balance   HALL, PT, LAURA A - 04/02/2021 10:30 EDT   AM-PAC Basic Mobility   AM-PAC Basic Mobility   Turning from your back on your side while in a flat bed without using bedrails? :   A lot = 2 points   Moving from lying on your back to sitting on the side of a flat bed without using bedrails? :   A lot = 2 points   Moving to and from a bed to a chair (including a wheelchair)? :   A little = 3 points   Standing up from a chair using your arms (e.g. wheelchair or bedside chair)? :   A little = 3 points   To walk in hospital room? :   A little = 3 points   HALL, PT, LAURA  A - 04/02/2021 10:30 EDT   AM-PAC Mobility Raw Score :   13    HALL, PT, LAURA A - 04/02/2021 10:30 EDT   Assessment   PT Impairments or Limitations :   Ambulation deficits, Balance deficits, Bed mobility deficits, Pain limiting function, Strength deficits, Transfer deficits   Discharge Recommendations :   eval: acute rehab     D/CTransportation Recommendations :   Stretcher/Requires skilled assist for transfer   D/C Transportation Recommendations Reviewed :   Yes   PT Treatment Recommendations :   Pt has impairments with functional mobility and ambulation. Pt would benefit from acute skilled PT services for bed mobility, transfer and gait training. Rec d/c to acute rehab.     HALL, PT, LAURA A - 04/02/2021 10:30 EDT   Education   Home Caregiver Name/Relationship :   self  Wife   SHONA, PT, LEITA A - 04/02/2021 10:30 EDT   Physical Therapy Education Grid   Physical Therapy Plan of Care :   Verbalizes  understanding, Needs practice/supervision   HALL, PT, LAURA A - 04/02/2021 10:30 EDT   Long Term Goals   PT LT Goals Reviewed :   [place] SHONA, PT, LAURA A - 04/02/2021 10:30 EDT   Outpatient PT Long Term Goals Rehab     Long Term Goal 1  Long Term Goal 2  Long Term Goal 3      Goal :    ind with supine to sit to stand   ind with amb 313ft with RW   ind with asend/desend 4 steps with rail        Comments :    8 days from 04/02/21                HALL, PT, LAURA A - 04/02/2021 10:30 EDT  HALL, PT, LAURA A - 04/02/2021 10:30 EDT  HALL, PT, LAURA A - 04/02/2021 10:30 EDT       Short Term Goals   PT ST Goals Reviewed :   Yes   HALL, PT, LAURA A - 04/02/2021 10:30 EDT   Other PT Goals Grid     Goal #1  Goal #2  Goal #3      Goal :    CGA supine to sit   CGA sit to stand   CGA for amb 146ft with RW        Comments :    4 days from //                Butler, PT, LAURA A - 04/02/2021 10:30 EDT  HALL, PT, LAURA A - 04/02/2021 10:30 EDT  HALL, PT, LAURA A - 04/02/2021 10:30 EDT       Plan   PT Frequency Acute :   Daily   Duration :   30    PT Duration Unit Rehab :   Days   Treatments Planned :   Balance training, Bed mobility training, Functional training, Gait training, Stair training, Therapeutic activities, Therapeutic exercises, Transfer training   Treatment Plan/Goals Established With Patient/Caregiver :   Yes   Other PT Treatment Provided :   pt will be seen 5-7 days per week for PT   Evaluation Complete :   Yes   HALL, PT, LAURA A - 04/02/2021 10:30 EDT   Time Spent With Patient   PT Individual Eval Time, Moderate Complexity :   15 minutes   PT Evaluation Units, Moderate Complexity :   1 Unit  PT Treatment Time Comment :   supine to sit to stand, returned to supine with needs in reach and nsg udpated + alarm     PT Functional Training Units :   1 units   PT Functional Training Time :   8 minutes   PT Total Timed Code Treatment Units :   1 units   PT Total Timed Code Min :   8    PT Total Untimed Min :   15    PT Total  Treatment Time Acute/OP :   23    HALL, PT, LAURA A - 04/02/2021 10:30 EDT

## 2021-04-02 NOTE — Case Communication (Signed)
CM D/C Planning Assessment Ongoing- Text       CM Admission Assessment Entered On:  04/02/2021 16:13 EDT    Performed On:  04/02/2021 13:30 EDT by Windell Moment Ann-RN               CM Admission Assessment   CM Date and Time Inpatient Order :   04/02/2021 15:43 EDT   CM Date and Time Admission IM :   04/03/2021 7:55 EDT   CM Date of 3rd MN Occurrence :   04/06/2021 EDT   Elie Confer M-RN - 04/03/2021 7:55 EDT   CM Reason for Care Management Referral :   Discharge planning assessment   CM Insurance Information :   Medicare   CM Insurance Co. Name :   1. Medicare   2. BCBS OOS with PPO   CM Name of Patient Pharmacy :   [facility]   CM Date and Time Observation Order :   04/01/2021 6:30 EDT   CM Date and Time Moon Given :   04/02/2021 13:30 EDT   CM Patient has PCP :   Yes   CM PCP Search :   Rudell Cobb   Discharge Transporation Needs :   No   CM Assessment Discussion With :   Patient   CM Home/Lay Caregiver Name/Relationship :   Kaushal, Dirico [phone]   CM Living Situation :   Home with no services, Family support   CM Initial Tentative Discharge Plan :   home   CM Alternate Discharge Plan :   home with hh   Anticipated Hosp Related Barriers to DC :   None identified   CM Home Barriers :   None   CM Progress Note :   04/02/21 TS MOON OBS Notice @1330  verbally. Pt verbalized understanding, copy to pt/chart. 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 Pt lives with Myrtie Hawk Spurgeon [place] [phone] who will provide transportation home. Pt is IADLs, but spouse sometimes helps with socks. No DME and not current with any HH. PCP is Dr. Eduardo Osier and [facility] [place]. CM will cont to follow for dispo pending clinical course.      CM Admission Assessment Complete :   Yes   Windell Moment Ann-RN - 04/02/2021 16:08 EDT

## 2021-04-02 NOTE — Progress Notes (Signed)
Outpatient OT Certification Letter-Text       OT Outpatient Certification Letter Entered On:  04/02/2021 10:31 EDT    Performed On:  04/02/2021 10:30 EDT by Crisoforo Oxford, OT, STEPHEN               Physician Certification   Date of Injury or Surgery :   04/01/2021 EDT   Ordering Physician Name :   Candi Leash C-MD   Number of Visits This Interval :   1   Dear Physician :   Thank you for your referral. Below is the patient information for the stated interval of treatment. Please review, modify (if necessary), sign, and return. Thank you.   Date of Evaluation :   04/02/2021 EDT   OT Certification Interval End :   04/12/2021 EDT   Physician Signature Required :   Yes   Staff Physician Signature :   The physician's electronic signature noted above indicates approval of the documented Plan of Care for the stated interval.   AMMONS, OT, STEPHEN - 04/02/2021 10:30 EDT   Problem List   (As Of: 04/02/2021 10:31:22 EDT)   Problems(Active)    Acute myelopathy (SNOMED CT  :5852778242 )  Name of Problem:   Acute myelopathy ; Recorder:   LEWEY,  JENNIFER L-FNP; Confirmation:   Confirmed ; Classification:   Medical ; Code:   3536144315 ; Contributor System:   Dietitian ; Last Updated:   08/24/2019 9:04 EDT ; Life Cycle Status:   Active ; Responsible Provider:   Demetrius Revel L-FNP; Vocabulary:   SNOMED CT        Back pain (SNOMED CT  :400867619 )  Name of Problem:   Back pain ; Recorder:   PYLE, RN, JOYCE; Confirmation:   Confirmed ; Classification:   Patient Stated ; Code:   509326712 ; Contributor System:   Dietitian ; Last Updated:   06/28/2019 11:25 EST ; Life Cycle Date:   06/28/2019 ; Life Cycle Status:   Active ; Vocabulary:   SNOMED CT        History of DVT of lower extremity (SNOMED CT  :4580998338 )  Name of Problem:   History of DVT of lower extremity ; Recorder:   Demetrius Revel L-FNP; Confirmation:   Confirmed ; Classification:   Medical ; Code:   2505397673 ; Contributor System:   Dietitian ; Last Updated:    03/21/2021 11:03 EDT ; Life Cycle Date:   03/21/2021 ; Life Cycle Status:   Active ; Vocabulary:   SNOMED CT        OSA (obstructive sleep apnea) (SNOMED CT  :419379024 )  Name of Problem:   OSA (obstructive sleep apnea) ; Recorder:   PYLE, RN, JOYCE; Confirmation:   Confirmed ; Classification:   Patient Stated ; Code:   097353299 ; Contributor System:   PowerChart ; Last Updated:   06/28/2019 11:25 EST ; Life Cycle Date:   06/28/2019 ; Life Cycle Status:   Active ; Vocabulary:   SNOMED CT        Sciatica (SNOMED CT  :24268341 )  Name of Problem:   Sciatica ; Recorder:   PYLE, RN, JOYCE; Confirmation:   Confirmed ; Classification:   Patient Stated ; Code:   96222979 ; Contributor System:   PowerChart ; Last Updated:   06/28/2019 11:25 EST ; Life Cycle Date:   06/28/2019 ; Life Cycle Status:   Active ; Vocabulary:   SNOMED CT  Diagnoses(Active)    Cervical myelopathy  Date:   03/04/2021 ; Diagnosis Type:   Discharge ; Confirmation:   Confirmed ; Clinical Dx:   Cervical myelopathy ; Classification:   Medical ; Clinical Service:   Non-Specified ; Code:   ICD-10-CM ; Probability:   0 ; Diagnosis Code:   G95.9      Cervicalgia  Date:   04/02/2021 ; Diagnosis Type:   Other ; Confirmation:   Differential ; Clinical Dx:   Cervicalgia ; Classification:   Interdisciplinary ; Clinical Service:   Non-Specified ; Code:   ICD-10-CM ; Probability:   0 ; Diagnosis Code:   M54.2        Plan   OT Frequency Outpatient :   Mo/Tu/We/Th/Fr   OT Duration Outpatient :   10    OT Duration Unit Outpatient :   Days   OT Anticipated Treatments, Needs :   Basic Activities of Daily Living, HEP, Manual therapy, Mobility training, Neuromuscular reeducation, Pain management, Patient education, Safety education, Therapeutic activities, Therapeutic exercises, Therapeutic exercises for strengthening and ROM   OT Certification Letter Complete :   Yes   AMMONS, OT, STEPHEN - 04/02/2021 10:30 EDT   Outpatient Review   OT Clinical Assessment Summary :    Pt would benefit from acute rehab upon d/c.     Rehab Potential Occupational Therapy :   Fair   Rehab Potential OT Fair Due To :   Severity of impairment   OT Impairments or Limitations :   Basic activity of daily living deficits, Mobility deficits, Pain, Sensory deficits, Strength deficits, Other: Post op restrictions.   AMMONS, OT, STEPHEN - 04/02/2021 10:30 EDT      Signature Line                                                 Electronically Signed On 04/02/21 10:30 AM                                               _________________________________________________                                               Austin Miles, STEPHEN                                                     Electronically Signed On 04/02/21 01:31 PM                                               _________________________________________________                                                Candi Leash  Dicatation Date: 04/02/21 10:30 AM

## 2021-04-03 NOTE — Progress Notes (Signed)
 Inpatient PT Daily Documentation - Text       Inpatient PT Daily Documentation Entered On:  04/03/2021 17:00 EDT    Performed On:  04/03/2021 16:44 EDT by KENNYTH DOMINO D-PTA               Reason for Treatment   *Reason for Referral :   s/p C5-6 PCDF    fall risk, cervical precautions; PMH- chronic pain     PTA Visit Acute :   pta #1  rn and pt agree to PT.  I've had several back surgeries and none of them have been this painful   KENNYTH DOMINO D-PTA - 04/03/2021 16:44 EDT   Pain Assessment   Pain Present :   Yes actual or suspected pain   Pain Present :   Neck   Laterality :   Right   Duration :   upright position   Self Report Pain :   Numeric rating scale   KENNYTH DOMINO D-PTA - 04/03/2021 16:44 EDT   Mobility   Mobility Grid   Supine to Sit :   1, Rehab Moderate assistance   (Comment: see assess NANCI DOMINO D-PTA - 04/03/2021 16:44 EDT] )   Sit to Supine :   1, Rehab Moderate assistance   Edge of Bed Assist :   Does not occur   Transfer Sit to Stand :   Does not occur   Transfer Stand to Sit :   Does not occur   KENNYTH DOMINO D-PTA - 04/03/2021 16:44 EDT   Assessment   PT Impairments or Limitations :   Ambulation deficits, Balance deficits, Bed mobility deficits, Pain limiting function, Strength deficits, Transfer deficits   Discharge Recommendations :   eval: acute rehab     D/CTransportation Recommendations :   Stretcher/Requires skilled assist for transfer   D/C Transportation Recommendations Reviewed :   Yes   PT Treatment Recommendations :   pt c HOB elevated finishing lunch.  semi-sidelying EOB, attempting to sit upright EOB.  Mod A 1, pt was able to swing LEs over EOB, but unable to come to full sit 2nd to painful neck 10!SABRA  Several attmepts made c pt resting in sidelying briefly between ea. attempt.  Last attempt was pt using therapist's hands to pull up rather than push down on bed c slightly better results.  Supine ap's, quad-sets and glute sets x 10, heel slides x 10, slr & hip  abd/add x8 c opposite leg bent knee position.  Positioned to ocmfort all needs in reach.    After several attempts pt was unable to reach sitting position EOB.  He did state that yesterday he was feel pain R hip-flank-neck and today was essentially in neck traps only.  rn made aware.  ?if neck brace/soft colloar may help?   Tolerated supine ex well denied pain/strain to low back.  pt cautioned if any strain to lowback to stop ex. Will need rehab to regain independenc before returning home.     KENNYTH DOMINO D-PTA - 04/03/2021 16:44 EDT   Time Spent With Patient   PT Time In :   13:03 EST   PT Time Out :   13:33 EST   PT Functional Training Time :   30 minutes   PTA Functional Training Units :   2 units   PT Total Timed Code Treatment Units :   2 units   PT Total Timed Code Min :  30    PT Total Treatment Time Acute/OP :   30    KENNYTH DOMINO D-PTA - 04/03/2021 16:44 EDT

## 2021-04-03 NOTE — Case Communication (Signed)
CM Discharge Planning Assessment - Text       CM Progress Note Entered On:  04/03/2021 14:12 EDT    Performed On:  04/03/2021 14:12 EDT by Elie Confer M-RN               CM Progress Note   CM Home/Lay Caregiver Name/Relationship :   [place], Hennon [phone]   CM Initial Tentative Discharge Plan :   home   CM Alternate Discharge Plan :   home with hh   CM Progress Note :   04/02/21 TS MOON OBS Notice @1330  verbally. Pt verbalized understanding, copy to pt/chart. 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 Pt lives with Raif Chachere [phone] who will provide transportation home. Pt is IADLs, but spouse sometimes helps with socks. No DME and not current with any HH. PCP is Dr. [phone] and [facility] [place]. CM will cont to follow for dispo pending clinical course.     10/26: Patient changed from OBS to IP. Im signed and copy given. (JLD)  ***PT recommends acte rehab at DC. Patient states he went to [facility] last year. He wants to speak with  his spouse prior to making frcision. Call back number for CM was given. CM to follow. (JLD)  ***CM spoke with patietn and he would like [facility]. Referral sent. (JLD)     [employer] M-RN - 04/03/2021 14:12 EDT

## 2021-04-03 NOTE — Case Communication (Signed)
CM Discharge Planning Assessment - Text       CM Progress Note Entered On:  04/03/2021 7:56 EDT    Performed On:  04/03/2021 7:56 EDT by Elie Confer M-RN               CM Progress Note   CM Home/Lay Caregiver Name/Relationship :   Ayub, Kirsh [phone]   CM Initial Tentative Discharge Plan :   home   CM Alternate Discharge Plan :   home with hh   CM Progress Note :   04/02/21 TS MOON OBS Notice @1330  verbally. Pt verbalized understanding, copy to pt/chart. 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 Pt lives with Aizen Duval [phone] who will provide transportation home. Pt is IADLs, but spouse sometimes helps with socks. No DME and not current with any HH. PCP is Dr. [phone] and [facility] [place]. CM will cont to follow for dispo pending clinical course.     10/26: Patient changed from OBS to IP. Im signed and copy given. (JLD)     11/26 M-RN - 04/03/2021 7:56 EDT

## 2021-04-03 NOTE — Case Communication (Signed)
CM Discharge Planning Assessment - Text       CM Progress Note Entered On:  04/03/2021 10:56 EDT    Performed On:  04/03/2021 10:55 EDT by Elie Confer M-RN               CM Progress Note   CM Home/Lay Caregiver Name/Relationship :   [place], Fritze [phone]   CM Initial Tentative Discharge Plan :   home   CM Alternate Discharge Plan :   home with hh   CM Progress Note :   04/02/21 TS MOON OBS Notice @1330  verbally. Pt verbalized understanding, copy to pt/chart. 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 Pt lives with Damonie Ellenwood [phone] who will provide transportation home. Pt is IADLs, but spouse sometimes helps with socks. No DME and not current with any HH. PCP is Dr. [phone] and Rxs with [facility] or [facility] in [place]. CM will cont to follow for dispo pending clinical course.     10/26: Patient changed from OBS to IP. Im signed and copy given. (JLD)  ***PT recommends acte rehab at DC. Patient states he went to [facility] last year. He wants to speak with  his spouse prior to making frcision. Call back number for CM was given. CM to follow. (JLD)     [facility] M-RN - 04/03/2021 10:55 EDT

## 2021-04-03 NOTE — Progress Notes (Signed)
Rehab Medicine Progress Note               Referral received via [facility]. Will review chart for medical necessity, pending PT/OT recommendations and safe/viable DC plan. Will be in contact with CM.    Signature Line     Electronically Signed on 04/03/2021 02:30 PM EDT   ________________________________________________   Maceo Pro by Caryn Bee on April 03, 2021 15:14 EDT               PT/OT rec. IRF. Solid DC plan. Will follow until medically cleared.     Signature Line     Electronically Signed on 04/03/2021 03:14 PM EDT   ________________________________________________   Caryn Bee               Modified by: Caryn Bee on 04/03/2021 03:14 PM EDT

## 2021-04-04 NOTE — Nursing Note (Signed)
Nursing Discharge Summary - Text       Nursing Discharge Summary Entered On:  04/04/2021 13:38 EDT    Performed On:  04/04/2021 13:35 EDT by Theda Belfast C-RN               DC Information   Discharge To :   Acute Rehab, Family support   Mode of Discharge :   Stretcher   Transportation :   Ground ambulance   Accompanied By :   Sharyne Richters,  Ashok Cordia C-RN - 04/04/2021 13:35 EDT   Education   Responsible Learner(s) :   Home Caregiver Name/Relationship: selfWife        Performed by: Lehman Prom A - 04/02/2021 10:30     Home Caregiver Present for Session :   Yes   Barriers To Learning :   None evident   Teaching Method :   Demonstration, Explanation, Printed materials   Theda Belfast C-RN - 04/04/2021 13:35 EDT   Post-Hospital Education Adult Grid   Activity Expectations :   Verbalizes understanding   Importance of Follow-Up Visits :   Verbalizes understanding   Pain Management :   Verbalizes understanding, Needs practice/supervision   (Comment: pt reports overuse at home.  Theda Belfast C-RN - 04/04/2021 13:35 EDT] )   Physical Limitations :   Verbalizes understanding   Plan of Care :   Verbalizes understanding   Postoperative Instructions :   Verbalizes understanding   When to Call Health Care Provider :   [facility] understanding   Theda Belfast C-RN - 04/04/2021 13:35 EDT   Health Maintenance Education Adult Grid   Bathing/Hygiene :   Trenton Gammon understanding   Diet/Nutrition :   Verbalizes understanding   Exercise :   Verbalizes understanding   Theda Belfast C-RN - 04/04/2021 13:35 EDT   Medication Education Adult Grid   Drug to Drug Interactions :   Verbalizes understanding   Med Dosage, Route, Scheduling :   TEFL teacher understanding   Med Generic/Brand Name, Purpose, Action :   Verbalizes understanding   Medication Precautions :   IT sales professional, Medication :   Verbalizes understanding   Theda Belfast C-RN - 04/04/2021 13:35 EDT   Safety Education Adult Grid   Safety, Fall :   Verbalizes  understanding   Theda Belfast C-RN - 04/04/2021 13:35 EDT   Additional Learner(s) Present :   Spouse   Time Spent Educating Patient :   30 minutes   Theda Belfast C-RN - 04/04/2021 13:35 EDT

## 2021-04-04 NOTE — Progress Notes (Signed)
IRF-PAI Quality Indicators Adm -Text       IRF-PAI Quality Indicators Admission Entered On:  04/04/2021 16:51 EDT    Performed On:  04/04/2021 16:41 EDT by Leandra Kern M-RN               Section A: Administrative Information Admit   [phone] Ethnicity :   A. No, not of Hispanic, Latino/a, or Spanish origin   A41 Race :   A. White   Preferred Spoken Language :   English   A1110 Interpreter Wanted/Needed :   No   No Transport to Med Appts/Meetings/Work/Meds/Necessities :   No   Leandra Kern M-RN - 04/04/2021 16:41 EDT   Section B: Hearing Speech and Vision   Ability to Hear :   0. Adequate - no difficulty in normal conversation, social interaction, listening to TV   Visual Acuity :   0. Adequate - sees fine detail, such as regular print in newspapers/books   Health Literacy SILS :   Never   BB0700 Expression of Ideas and Wants :   Expresses complex messages without difficulty and with speech that is clear and easy to understand - 4   BB0800 Understanding Verbal Content :   Understands - Clear comprehension without cues or repetitions - 4   Leandra Kern M-RN - 04/04/2021 16:41 EDT   Section D: Mood   Little Interest,Pleasure in Doing Things :   Not at all (0-1 Day)   Feeling Down, Depressed or Hopeless :   Not at all (0-1 Day)   PHQ Symptom Frequency Total :   0    FERGUSON, RN, CHANTEL M - 04/05/2021 12:40 EDT   PHQ Patient Able To Respond :   Yes   Little Interest,Pleasure, Present :   No   PHQ2 Little Interest Frequency Sum :   0    Feeling Down, Hopeless, Present :   No   PHQ2 Feeling Down Frequency Sum :   0    Should The PHQ-2 To 9 Be Continued :   No   PHQ Symptom Presence Total :   0    Total Severity Score PHQ-2 To 9 :   0    Social Isolation PROMIS :   Never   Leandra Kern M-RN - 04/04/2021 16:41 EDT   Section J: Admission Health Conditions   Pain Or Hurting In The Past 5 Days :   Yes   Pain Effect On Sleep Past 5 Days :   Almost constantly   Leandra Kern M-RN - 04/04/2021  16:41 EDT   Pain Interference With Therapy Activities Past 5 Days :   Rarely or not at all   FERGUSON, RN, CHANTEL M - 04/05/2021 12:40 EDT     Pain Effect On Daily Activity Past 5 Day :   Almost constantly   J1750 2 or More Fall,Fall Injury Last Yr :   No   J2000 Major Surgery 100 Days Pre Admit :   Yes   Leandra Kern M-RN - 04/04/2021 16:41 EDT   Section K: Swallowing Nutritional Status   Swallowing Status Admit IRF-PAI :   Regular food   K0520 Nutritional Approaches On Admission :   None   Leandra Kern M-RN - 04/04/2021 16:41 EDT   Section N: Admission Medications   N2001 Drug Regimen Review :   No - No issues found during review   Diamond Nickel - 04/05/2021 12:40 EDT   Y7062  High Risk Drug Classes     IRF-PAI Antipsychotic  IRF-PAI Anticoagulant  IRF-PAI Antibiotic  IRF-PAI Opioid    Is taking :    No   No   No   Yes  (Comment: Percocet 5/325mg  Leandra Kern M-RN - 04/04/2021 17:17 EDT] )    Indication noted :             Yes FERGUSON, RN, CHANTEL M - 04/05/2021 12:40 EDT       Leandra Kern M-RN - 04/04/2021 17:17 EDT  Leandra Kern M-RN - 04/04/2021 17:17 EDT  Leandra Kern M-RN - 04/04/2021 17:17 EDT  Leandra Kern M-RN - 04/04/2021 17:17 EDT        IRF-PAI Antiplatelet  IRF-PAI Hypoglycemic        Is taking :    No   No           Indication noted :                    Leandra Kern M-RN - 04/04/2021 17:17 EDT  Leandra Kern M-RN - 04/04/2021 17:17 EDT        Section O: Special Treatments, Procedures and Programs   O0110H IV Medications     O0110 H1. IV Medications  O0110 H2. Vasoactive medications  O0110 H3. Antibiotics  O0110 H4. Anticoagulation    Is taking :    No   No   No   No       Emelda Fear, RN, Milagros Reap - 04/05/2021 12:40 EDT  Emelda Fear, RN, Leron Croak M - 04/05/2021 12:40 EDT  Emelda Fear, RN, CHANTEL M - 04/05/2021 12:40 EDT  Emelda Fear, RN, CHANTEL M - 04/05/2021 12:40 EDT        O0110 H10. Other          Is taking :    No                 FERGUSON, RN, Milagros Reap - 04/05/2021 12:40 EDT         O0100O IV Access :   N/A   FERGUSON, RN, CHANTEL M - 04/05/2021 12:40 EDT   O0110A Chemotherapy :   N/A   O0110B Radiation Therapy :   N/A   O0110C Oxygen Therapy :   N/A   O0110D Suctioning :   N/A   O0110E Tracheostomy Care :   N/A   None of the above :   None of the above   Respiratory Information RTF :   Respiratory Documentation and Orders   Oxygen Therapy    Oxygen Therapy: Room air (04/04/21)       Respiratory Orders   No qualifying data available.     Leandra Kern M-RN - 04/04/2021 16:41 EDT

## 2021-04-04 NOTE — Progress Notes (Signed)
 Preadmission Screen Chart Review - Text       Preadmission Screening Chart Review Entered On:  04/04/2021 9:04 EDT    Performed On:  04/04/2021 8:16 EDT by Logan Longs E               Data History   Type of Evaluation :   Medical record review onsite   Date of Onset :   04/02/2021 EDT   Referring Physician :   VIKI SINK C-MD   Review  of Present Illness :   60 yo s/p C5-6 bilateral laminectomy for decompression of spinal cord per Dr. VIKI 10/24. He has had a prior ACDF at C5-6 with Dr. VIKI on 08/26/2019 for cervical myelopathy.  10/26 Neurosurgery Note:  Assessment/Plan Cervical myelopathy  POD #2 s/p posterior cervical decompression C5-6  Continue dexamethasone  x 2 days for cervical myelopathy; strength overall improving  Continue pain management; patient has a high tolerance for pain medication and is on chronic pain management with Percocet .   He is currently getting Percocet  10 mg q 4 hours, Tylenol  ATC, cyclobenzaprine was changed to tizanidine  4 mg q 6 hours d/t increased muscle pain but has not received any muscle relaxers.  Will d/c Flexeril and add scheduled Zanaflex  and continue Percocet  and try to decrease IV Morphine .  Appears he had a seroma, now that the drainage is out the pressure to his posterior neck should feel better.   He needs to mobilize.    He is allergic to gabapentin unfortunately but does not have much in the way of radicular pain.   Recommend ice packs as needed.   Patient has requested to have his Percocet  increased to 15 mg q 4 hours which I have informed him I will not do.  PT, OT evaluations pending  SCD's for DVT prophylaxis  10/25 Neurosurgery Note:  Assessment/Plan Cervical myelopathy POD #1 s/p posterior cervical decompression C5-6  Foley d/c'd this am, awaiting first void  continue dexamethasone  x 2 days for cervical myelopathy; strength overall improving  Continue pain management; patient has a high tolerance for pain medication and is on chronic  pain management with Percocet . He is currently getting Percocet  10 mg q 4 hours, Tylenol  ATC, cyclobenzaprine was changed to tizanidine  4 mg q 6 hours d/t increased muscle pain; he is allergic to gabapentin unfortunately but does not have much in the way of radicular pain. Recommend ice packs as needed. Patient has requested to have his Percocet  increased to 15 mg q 4 hours which I have informed him I will not do.  PT, OT evaluations pending  SCD's for DVT prophylaxis                Logan Longs BRAVO - 04/04/2021 8:16 EDT   Problem History   (As Of: 04/04/2021 09:04:11 EDT)   Problems(Active)    Acute myelopathy (SNOMED CT  :8784240981 )  Name of Problem:   Acute myelopathy ; Recorder:   LEWEY,  JENNIFER L-FNP; Confirmation:   Confirmed ; Classification:   Medical ; Code:   [id number] ; Contributor System:   PowerChart ; Last Updated:   08/24/2019 9:04 EDT ; Life Cycle Status:   Active ; Responsible Provider:   RODOLFO NEST L-FNP; Vocabulary:   SNOMED CT        Back pain (SNOMED CT  :747688984 )  Name of Problem:   Back pain ; Recorder:   PYLE, RN, JOYCE; Confirmation:   Confirmed ; Classification:  Patient Stated ; Code:   [id number] ; Contributor System:   PowerChart ; Last Updated:   06/28/2019 11:25 EST ; Life Cycle Date:   06/28/2019 ; Life Cycle Status:   Active ; Vocabulary:   SNOMED CT        History of DVT of lower extremity (SNOMED CT  :7013061988 )  Name of Problem:   History of DVT of lower extremity ; Recorder:   RODOLFO NEST L-FNP; Confirmation:   Confirmed ; Classification:   Medical ; Code:   [id number] ; Contributor System:   PowerChart ; Last Updated:   03/21/2021 11:03 EDT ; Life Cycle Date:   03/21/2021 ; Life Cycle Status:   Active ; Vocabulary:   SNOMED CT        OSA (obstructive sleep apnea) (SNOMED CT  :870110984 )  Name of Problem:   OSA (obstructive sleep apnea) ; Recorder:   PYLE, RN, JOYCE; Confirmation:   Confirmed ; Classification:   Patient Stated ; Code:   [id number] ; Contributor  System:   PowerChart ; Last Updated:   06/28/2019 11:25 EST ; Life Cycle Date:   06/28/2019 ; Life Cycle Status:   Active ; Vocabulary:   SNOMED CT        Sciatica (SNOMED CT  :61272986 )  Name of Problem:   Sciatica ; Recorder:   PYLE, RN, JOYCE; Confirmation:   Confirmed ; Classification:   Patient Stated ; Code:   [id number] ; Contributor System:   PowerChart ; Last Updated:   06/28/2019 11:25 EST ; Life Cycle Date:   06/28/2019 ; Life Cycle Status:   Active ; Vocabulary:   SNOMED CT          Allergy   (As Of: 04/04/2021 09:04:11 EDT)   Allergies (Active)   codeine  Estimated Onset Date:   Unspecified ; Reactions:   NA ; Created By:   CLARISE LOT D; Reaction Status:   Active ; Category:   Drug ; Substance:   codeine ; Type:   Allergy ; Severity:   Unknown ; Updated By:   CLARISE LOT D; Reviewed Date:   04/01/2021 15:19 EDT      Cymbalta  Estimated Onset Date:   Unspecified ; Reactions:   NA ; Created By:   CLARISE LOT D; Reaction Status:   Active ; Category:   Drug ; Substance:   Cymbalta ; Type:   Allergy ; Severity:   Unknown ; Updated By:   CLARISE LOT D; Reviewed Date:   04/01/2021 15:19 EDT      Neurontin  Estimated Onset Date:   Unspecified ; Reactions:   NA ; Created By:   CLARISE LOT D; Reaction Status:   Active ; Category:   Drug ; Substance:   Neurontin ; Type:   Allergy ; Severity:   Unknown ; Updated By:   CLARISE LOT D; Reviewed Date:   04/01/2021 15:19 EDT        Data   Preadmission Data Results :   Rehab Preadmission Data  Body Mass Index est meas: 25.99 kg/m2 (04/01/21 06:55:20)  Height/Length Estimated: 172.7 cm (04/01/21 06:55:00)  Weight Measured: 76.9 kg (04/01/21 06:55:00)     Weight Estimated :   76.900 kg(Converted to: 169 lb 9 oz, 169.536 lb)    Height/Length Estimated :   175.3 cm(Converted to: 5 ft 9 in, 69.02 in)    Body Mass Index Estimated :   25.02 kg/m2  Current IV :   Yes   IV Details :   20g rt forearm, Rt. Wrist   Pain Present :   Yes actual or  suspected pain   Current PICC Line :   No   Chest X-Ray :   N/A   PPD Test :   No   Isolation Precautions :   Standard   Logan Rocky BRAVO - 04/04/2021 8:16 EDT   Preadm Precautions Grid   Fall :   Yes   Transfer/Mobility Limitations :   Yes   Logan Rocky BRAVO - 04/04/2021 8:16 EDT   Behavioral Problems :   None   Dialysis :   None   Diet Type :   Regular   Logan Rocky BRAVO - 04/04/2021 8:16 EDT   Pain Assessment   Pain Present :   Yes actual or suspected pain   Pain Present :   Neck   Laterality :   Bilateral   Quality :   Aching   Pain Negatively Impacts :   Appetite, Concentration, Daily life, Sleep   Self Report Pain :   Numeric rating scale   Numeric Pain Scale :   7   Acceptable Numeric Pain Scale :   2   Numeric Pain Acceptable Intensity Score :   2    Numeric Pain Score :   7    FACE-NVPS :   No particular expression or smile   Logan Rocky BRAVO - 04/04/2021 8:16 EDT   Lab Values, Diagnostics, Vital Signs   Lab Results RTF :   No qualifying data available.     Preadm Diagnostic Studies :   09/11/20 MRI C-Spine:    IMPRESSION: Overall, little interval change.    1.  At C5-C6, prior ACDF.  -Residual disc/osteophyte formation causes spinal canal stenosis with moderately  severe cord deformity.  -There may be minimal cord signal abnormality on the left, which cannot be  confirmed with certainty.  -There remains severe bilateral neural foraminal stenosis.    2. At C4-C5, there is spinal canal stenosis with mild cord deformity.  -Moderately severe bilateral neural foraminal stenosis.    3. Chronic degenerative changes cause additional neural foraminal stenosis at  multiple levels.  -At C6-C7, severe left neural foraminal stenosis.  -At C3-C4, moderately severe right and moderate left neural foraminal stenosis.  -At C2-C3, mild to moderate left neural foraminal stenosis.      3/30 Lumbar Spine:    IMPRESSION:  1. L2/L3, fusion across the disc space right lateral  Central spondylosis with mild to moderate canal  stenosis.    2. L4/5 and L5/S1, prior fusion, no complicating features.    3. L3/L4, mild grade 1 retrolisthesis. Mild canal narrowing  No disc herniation or canal stenosis.         Vital Signs Results :   Rehab Vital Signs Grouper  Diastolic Blood Pressure: 70 mmHg (04/04/21 07:59:00)  Diastolic Blood Pressure: 72 mmHg (04/04/21 06:13:00)  Diastolic Blood Pressure: 83 mmHg (04/04/21 04:07:00)  Diastolic Blood Pressure: 81 mmHg (04/04/21 01:54:00)  Diastolic Blood Pressure: 56 mmHg Low (04/03/21 23:51:00)  Diastolic Blood Pressure: 77 mmHg (04/03/21 20:45:00)  Diastolic Blood Pressure: 65 mmHg (04/03/21 19:53:00)  Diastolic Blood Pressure: 71 mmHg (04/03/21 16:29:00)  Diastolic Blood Pressure: 77 mmHg (04/03/21 11:25:00)  Diastolic Blood Pressure: 84 mmHg (04/03/21 08:10:00)  Diastolic Blood Pressure: 84 mmHg (04/03/21 03:36:00)  Diastolic Blood Pressure: 83 mmHg (04/02/21 23:59:00)  Diastolic Blood Pressure:  81 mmHg (04/02/21 19:48:00)  Diastolic Blood Pressure: 82 mmHg (04/02/21 16:15:00)  Diastolic Blood Pressure: 74 mmHg (04/02/21 11:32:00)  Heart Rate Monitored: 54 bpm Low (04/04/21 07:59:00)  Heart Rate Monitored: 65 bpm (04/04/21 04:07:00)  Heart Rate Monitored: 60 bpm (04/04/21 01:54:00)  Heart Rate Monitored: 53 bpm Low (04/03/21 23:51:00)  Heart Rate Monitored: 62 bpm (04/03/21 20:45:00)  Heart Rate Monitored: 75 bpm (04/03/21 19:53:00)  Heart Rate Monitored: 79 bpm (04/03/21 16:29:00)  Heart Rate Monitored: 78 bpm (04/03/21 11:25:00)  Heart Rate Monitored: 81 bpm (04/03/21 08:10:00)  Heart Rate Monitored: 68 bpm (04/03/21 03:36:00)  Heart Rate Monitored: 61 bpm (04/02/21 23:59:00)  Heart Rate Monitored: 69 bpm (04/02/21 19:48:00)  Heart Rate Monitored: 79 bpm (04/02/21 16:57:00)  Heart Rate Monitored: 82 bpm (04/02/21 16:15:00)  Heart Rate Monitored: 63 bpm (04/02/21 12:29:00)  Heart Rate Monitored: 70 bpm (04/02/21 11:32:00)  Mean Arterial Pressure, Cuff: 80 mmHg (04/04/21 07:59:00)  Mean Arterial  Pressure, Cuff: 81 mmHg (04/04/21 06:13:00)  Mean Arterial Pressure, Cuff: 100 mmHg (04/04/21 04:07:00)  Mean Arterial Pressure, Cuff: 95 mmHg (04/04/21 01:54:00)  Mean Arterial Pressure, Cuff: 67 mmHg Low (04/03/21 23:51:00)  Mean Arterial Pressure, Cuff: 87 mmHg (04/03/21 20:45:00)  Mean Arterial Pressure, Cuff: 73 mmHg (04/03/21 19:53:00)  Mean Arterial Pressure, Cuff: 90 mmHg (04/03/21 16:29:00)  Mean Arterial Pressure, Cuff: 92 mmHg (04/03/21 11:25:00)  Mean Arterial Pressure, Cuff: 98 mmHg (04/03/21 08:10:00)  Mean Arterial Pressure, Cuff: 104 mmHg High (04/03/21 03:36:00)  Mean Arterial Pressure, Cuff: 112 mmHg High (04/02/21 23:59:00)  Mean Arterial Pressure, Cuff: 100 mmHg (04/02/21 19:48:00)  Mean Arterial Pressure, Cuff: 93 mmHg (04/02/21 16:15:00)  Mean Arterial Pressure, Cuff: 91 mmHg (04/02/21 11:32:00)  Peripheral Pulse Rate: 54 bpm Low (04/04/21 07:59:00)  Peripheral Pulse Rate: 63 bpm (04/04/21 04:07:00)  Peripheral Pulse Rate: 68 bpm (04/04/21 01:54:00)  Peripheral Pulse Rate: 77 bpm (04/03/21 19:53:00)  Peripheral Pulse Rate: 72 bpm (04/03/21 16:29:00)  Peripheral Pulse Rate: 77 bpm (04/03/21 11:25:00)  Peripheral Pulse Rate: 76 bpm (04/03/21 08:10:00)  Peripheral Pulse Rate: 69 bpm (04/03/21 03:36:00)  Peripheral Pulse Rate: 61 bpm (04/02/21 23:59:00)  Peripheral Pulse Rate: 74 bpm (04/02/21 19:48:00)  Peripheral Pulse Rate: 76 bpm (04/02/21 16:57:00)  Peripheral Pulse Rate: 81 bpm (04/02/21 16:15:00)  Peripheral Pulse Rate: 71 bpm (04/02/21 12:29:00)  Peripheral Pulse Rate: 69 bpm (04/02/21 11:32:00)  Respiratory Rate: 16 br/min (04/04/21 07:59:00)  Respiratory Rate: 16 br/min (04/04/21 07:40:00)  Respiratory Rate: 16 br/min (04/04/21 04:07:00)  Respiratory Rate: 16 br/min (04/04/21 02:55:00)  Respiratory Rate: 18 br/min (04/03/21 23:51:00)  Respiratory Rate: 18 br/min (04/03/21 20:45:00)  Respiratory Rate: 16 br/min (04/03/21 19:53:00)  Respiratory Rate: 16 br/min (04/03/21  18:36:00)  Respiratory Rate: 16 br/min (04/03/21 16:29:00)  Respiratory Rate: 16 br/min (04/03/21 14:42:00)  Respiratory Rate: 16 br/min (04/03/21 11:25:00)  Respiratory Rate: 16 br/min (04/03/21 09:40:00)  Respiratory Rate: 16 br/min (04/03/21 08:10:00)  Respiratory Rate: 18 br/min (04/03/21 05:31:00)  Respiratory Rate: 16 br/min (04/03/21 04:38:00)  Respiratory Rate: 18 br/min (04/03/21 03:36:00)  Respiratory Rate: 18 br/min (04/03/21 00:19:00)  Respiratory Rate: 18 br/min (04/02/21 23:59:00)  Respiratory Rate: 18 br/min (04/02/21 22:29:00)  Respiratory Rate: 16 br/min (04/02/21 19:48:00)  Respiratory Rate: 18 br/min (04/02/21 19:21:00)  Respiratory Rate: 16 br/min (04/02/21 19:00:00)  Respiratory Rate: 16 br/min (04/02/21 16:15:00)  Respiratory Rate: 16 br/min (04/02/21 16:10:00)  Respiratory Rate: 16 br/min (04/02/21 13:36:00)  Respiratory Rate: 16 br/min (04/02/21 11:32:00)  Respiratory Rate: 16 br/min (04/02/21 11:14:00)  Systolic Blood Pressure: 108 mmHg (04/04/21 07:59:00)  Systolic Blood Pressure: 108 mmHg (04/04/21 06:13:00)  Systolic Blood Pressure: 133 mmHg (04/04/21 04:07:00)  Systolic Blood Pressure: 126 mmHg (04/04/21 01:54:00)  Systolic Blood Pressure: 99 mmHg (04/03/21 23:51:00)  Systolic Blood Pressure: 121 mmHg (04/03/21 20:45:00)  Systolic Blood Pressure: 96 mmHg (04/03/21 19:53:00)  Systolic Blood Pressure: 126 mmHg (04/03/21 16:29:00)  Systolic Blood Pressure: 134 mmHg (04/03/21 11:25:00)  Systolic Blood Pressure: 140 mmHg (04/03/21 08:10:00)  Systolic Blood Pressure: 127 mmHg (04/03/21 03:36:00)  Systolic Blood Pressure: 143 mmHg High (04/02/21 23:59:00)  Systolic Blood Pressure: 135 mmHg (04/02/21 19:48:00)  Systolic Blood Pressure: 139 mmHg (04/02/21 16:15:00)  Systolic Blood Pressure: 122 mmHg (04/02/21 11:32:00)  Temperature Oral: 36.6 degC (04/04/21 07:59:00)  Temperature Oral: 36.6 degC (04/04/21 04:07:00)  Temperature Oral: 36.9 degC (04/03/21 23:51:00)  Temperature Oral: 36.9 degC  (04/03/21 20:45:00)  Temperature Oral: 36.9 degC (04/03/21 19:53:00)  Temperature Oral: 36.8 degC (04/03/21 16:29:00)  Temperature Oral: 36.8 degC (04/03/21 11:25:00)  Temperature Oral: 36.9 degC (04/03/21 08:10:00)  Temperature Oral: 36.8 degC (04/03/21 03:36:00)  Temperature Oral: 36.7 degC (04/02/21 23:59:00)  Temperature Oral: 36.8 degC (04/02/21 19:48:00)  Temperature Oral: 37 degC (04/02/21 16:15:00)  Temperature Oral: 36.8 degC (04/02/21 11:32:00)     Logan Longs E - 04/04/2021 8:16 EDT   Respiratory   Respiratory Status Results :   Rehab Respiratory Grouper  Chest Motion: Symmetrical (04/03/21 20:00:00)  Chest Motion: Symmetrical (04/03/21 16:30:00)  Chest Motion: Symmetrical (04/03/21 13:40:00)  Chest Motion: Symmetrical (04/03/21 10:05:00)  Chest Motion: Symmetrical (04/03/21 07:00:00)  Chest Motion: Symmetrical (04/01/21 20:00:00)  Respirations: Unlabored (04/04/21 07:00:00)  Respirations: Unlabored (04/04/21 04:00:00)  Respirations: Unlabored (04/04/21 00:45:00)  Respirations: Unlabored (04/03/21 20:00:00)  Respirations: Unlabored (04/03/21 19:00:00)  Respirations: Unlabored (04/03/21 16:30:00)  Respirations: Unlabored (04/03/21 13:40:00)  Respirations: Unlabored (04/03/21 10:05:00)  Respirations: Unlabored (04/03/21 07:00:00)  Respirations: Unlabored (04/03/21 03:00:00)  Respirations: Unlabored (04/02/21 23:59:00)  Respirations: Unlabored (04/02/21 19:45:00)  Respirations: Unlabored (04/02/21 19:10:00)  Respirations: Unlabored (04/02/21 15:00:00)  Respirations: Unlabored (04/02/21 13:00:00)  Respirations: Unlabored (04/02/21 09:00:00)  Respirations: Unlabored (04/02/21 07:00:00)  Respirations: Unlabored (04/01/21 20:00:00)  Respirations: Unlabored (04/01/21 17:00:00)  Respirations: Unlabored (04/01/21 14:51:00)  Respirations: Unlabored (04/01/21 14:26:00)  Respirations: Unlabored (04/01/21 12:45:00)  Respirations: Unlabored (04/01/21 07:29:00)  Respiratory Pattern: Regular (04/04/21  07:00:00)  Respiratory Pattern: Regular (04/04/21 04:00:00)  Respiratory Pattern: Regular (04/04/21 00:45:00)  Respiratory Pattern: Regular (04/03/21 20:00:00)  Respiratory Pattern: Regular (04/03/21 19:00:00)  Respiratory Pattern: Regular (04/03/21 16:30:00)  Respiratory Pattern: Regular (04/03/21 13:40:00)  Respiratory Pattern: Regular (04/03/21 10:05:00)  Respiratory Pattern: Regular (04/03/21 07:00:00)  Respiratory Pattern: Regular (04/03/21 03:00:00)  Respiratory Pattern: Regular (04/02/21 23:59:00)  Respiratory Pattern: Regular (04/02/21 19:45:00)  Respiratory Pattern: Regular (04/02/21 19:10:00)  Respiratory Pattern: Regular (04/02/21 15:00:00)  Respiratory Pattern: Regular (04/02/21 13:00:00)  Respiratory Pattern: Regular (04/02/21 09:00:00)  Respiratory Pattern: Regular (04/02/21 07:00:00)  Respiratory Pattern: Regular (04/01/21 20:00:00)  Respiratory Pattern: Regular (04/01/21 17:00:00)  Respiratory Pattern: Regular (04/01/21 14:51:00)  Respiratory Symptoms: None (04/04/21 07:00:00)  Respiratory Symptoms: None (04/04/21 04:00:00)  Respiratory Symptoms: None (04/04/21 00:45:00)  Respiratory Symptoms: None (04/03/21 20:00:00)  Respiratory Symptoms: None (04/03/21 19:00:00)  Respiratory Symptoms: None (04/03/21 16:30:00)  Respiratory Symptoms: None (04/03/21 13:40:00)  Respiratory Symptoms: None (04/03/21 10:05:00)  Respiratory Symptoms: None (04/03/21 07:00:00)  Respiratory Symptoms: None (04/03/21 03:00:00)  Respiratory Symptoms: None (04/02/21 23:59:00)  Respiratory Symptoms: None (04/02/21 19:45:00)  Respiratory Symptoms: None (04/02/21 19:10:00)  Respiratory Symptoms: None (04/02/21 15:00:00)  Respiratory Symptoms: None (04/02/21 13:00:00)  Respiratory Symptoms: None (04/02/21 09:00:00)  Respiratory  Symptoms: None (04/02/21 07:00:00)  Respiratory Symptoms: None (04/02/21 03:00:00)  Respiratory Symptoms: None (04/02/21 00:00:00)  Respiratory Symptoms: None (04/01/21 20:00:00)  Respiratory Symptoms: None  (04/01/21 19:00:00)  Respiratory Symptoms: None (04/01/21 17:00:00)  Respiratory Symptoms: None (04/01/21 14:51:00)     Respiratory Needs :   None   Comments :   SP)@ 97-98% Room [address]     Logan Longs E - 04/04/2021 8:16 EDT   Bowel and Bladder   Premorbid Bladder Management :   Continent   Premorbid Urinary Elimination :   Voiding, no difficulties   Urinary Elimination :   Voiding, no difficulties   Bladder Management :   Continent   Bowel Results :   Rehab Bowel and Bladder Grouper   No qualifying data available.     Premorbid Bowel Program :   Stool softener   Bowel Program :   Stool softener   Ostomy :   No   Last Bowel Movement :   03/31/2021 EDT   Comments :   10/26 RN assessment: Abdomen nondistended, non-tender, passing flatus, bowel sounds all quadrants. Patient states prior to hospitalization, his bowel habits include using stool softener and going every 3 days.     Logan Longs E - 04/04/2021 8:16 EDT   Wound   Incision Wound Results :   Rehab Incision/Wound Information  Neck Posterior - Incision, Wound Activity: Assessed (04/01/21 14:51:00)  Neck Posterior - Incision, Wound Dressing Activity: Assessed (04/03/21 20:00:00)  Neck Posterior - Incision, Wound Dressing Activity: Changed (04/03/21 10:00:00)  Neck Posterior - Incision, Wound Dressing Activity: Assessed (04/03/21 07:00:00)  Neck Posterior - Incision, Wound Dressing Activity: Assessed (04/02/21 19:46:00)  Neck Posterior - Incision, Wound Dressing Activity: Applied (04/02/21 19:00:00)  Neck Posterior - Incision, Wound Dressing Activity: Assessed (04/02/21 13:00:00)  Neck Posterior - Incision, Wound Dressing Activity: Assessed (04/02/21 07:00:00)  Neck Posterior - Incision, Wound Dressing Activity: Assessed (04/02/21 03:00:00)  Neck Posterior - Incision, Wound Dressing Activity: Assessed (04/01/21 20:00:00)  Neck Posterior - Incision, Wound Dressing Activity: Assessed (04/01/21 14:51:00)  Neck Posterior - Incision, Wound Dressing Activity:  Assessed (04/01/21 14:26:00)  Neck Posterior - Incision, Wound Dressing Activity: Assessed (04/01/21 12:45:00)  Neck Posterior - Incision, Wound Dressing Assessment: Clean, Dry, Intact (04/03/21 20:00:00)  Neck Posterior - Incision, Wound Dressing Assessment: Clean, Dry, Intact (04/03/21 10:00:00)  Neck Posterior - Incision, Wound Dressing Assessment: Clean, Dry, Intact (04/03/21 07:00:00)  Neck Posterior - Incision, Wound Dressing Assessment: Clean, Dry, Intact (04/02/21 19:46:00)  Neck Posterior - Incision, Wound Dressing Assessment: Clean, Dry, Intact (04/02/21 19:00:00)  Neck Posterior - Incision, Wound Dressing Assessment: Clean, Dry, Intact (04/02/21 13:00:00)  Neck Posterior - Incision, Wound Dressing Assessment: Clean, Dry, Intact (04/02/21 07:00:00)  Neck Posterior - Incision, Wound Dressing Assessment: Clean, Dry, Intact (04/02/21 03:00:00)  Neck Posterior - Incision, Wound Dressing Assessment: Clean, Dry, Intact (04/01/21 20:00:00)  Neck Posterior - Incision, Wound Dressing Assessment: Clean, Dry, Intact (04/01/21 14:51:00)  Neck Posterior - Incision, Wound Dressing Assessment: Clean, Dry, Intact (04/01/21 14:26:00)  Neck Posterior - Incision, Wound Dressing Assessment: Clean, Dry, Intact (04/01/21 12:45:00)  Neck Posterior - Incision, Wound Dressing: Gauze dressing, Tape (04/03/21 20:00:00)  Neck Posterior - Incision, Wound Dressing: Gauze dressing, Tape (04/03/21 10:00:00)  Neck Posterior - Incision, Wound Dressing: Hydrocolloid (04/03/21 07:00:00)  Neck Posterior - Incision, Wound Dressing: Hydrocolloid (04/02/21 19:46:00)  Neck Posterior - Incision, Wound Dressing: Hydrocolloid (04/02/21 19:00:00)  Neck Posterior - Incision, Wound Dressing: Hydrocolloid (04/02/21 13:00:00)  Neck Posterior -  Incision, Wound Dressing: Hydrocolloid (04/02/21 07:00:00)  Neck Posterior - Incision, Wound Dressing: Hydrocolloid (04/02/21 03:00:00)  Neck Posterior - Incision, Wound Dressing: Hydrocolloid (04/01/21  20:00:00)  Neck Posterior - Incision, Wound Dressing: Hydrocolloid (04/01/21 14:51:00)  Neck Posterior - Incision, Wound Dressing: Dry sterile dressing (04/01/21 12:45:00)  Neck Posterior - Incision, Wound Surrounding Tissue: Dry, Intact (04/03/21 10:00:00)  Neck Posterior - Incision, Wound Surrounding Tissue: Dry, Intact (04/03/21 07:00:00)  Neck Posterior - Incision, Wound Surrounding Tissue: Dry, Intact (04/02/21 19:46:00)  Neck Posterior - Incision, Wound Surrounding Tissue: Dry, Intact (04/02/21 19:00:00)  Neck Posterior - Incision, Wound Surrounding Tissue: Dry, Intact (04/02/21 13:00:00)  Neck Posterior - Incision, Wound Surrounding Tissue: Dry, Intact (04/02/21 03:00:00)  Neck Posterior - Incision, Wound Surrounding Tissue: Dry, Intact (04/01/21 20:00:00)  Neck Posterior - Incision, Wound Surrounding Tissue: Dry, Intact (04/01/21 14:51:00)  Neck Posterior - Incision,Wound Surrounding Tissue Color: Normal (04/03/21 10:00:00)  Neck Posterior - Incision,Wound Surrounding Tissue Color: Normal (04/03/21 07:00:00)  Neck Posterior - Incision,Wound Surrounding Tissue Color: Normal (04/02/21 19:46:00)  Neck Posterior - Incision,Wound Surrounding Tissue Color: Normal (04/02/21 19:00:00)  Neck Posterior - Incision,Wound Surrounding Tissue Color: Normal (04/02/21 13:00:00)  Neck Posterior - Incision,Wound Surrounding Tissue Color: Normal (04/02/21 03:00:00)  Neck Posterior - Incision,Wound Surrounding Tissue Color: Normal (04/01/21 20:00:00)  Neck Posterior - Skin Abnormality Type: Surgical incision (04/03/21 20:00:00)  Neck Posterior - Skin Abnormality Type: Surgical incision (04/03/21 10:00:00)  Neck Posterior - Skin Abnormality Type: Surgical incision (04/03/21 07:00:00)  Neck Posterior - Skin Abnormality Type: Surgical incision (04/02/21 19:46:00)  Neck Posterior - Skin Abnormality Type: Surgical incision (04/02/21 19:00:00)  Neck Posterior - Skin Abnormality Type: Surgical incision (04/02/21 13:00:00)  Neck  Posterior - Skin Abnormality Type: Surgical incision (04/02/21 07:00:00)  Neck Posterior - Skin Abnormality Type: Surgical incision (04/02/21 03:00:00)  Neck Posterior - Skin Abnormality Type: Surgical incision (04/01/21 20:00:00)  Neck Posterior - Skin Abnormality Type: Surgical incision (04/01/21 14:51:00)  Neck Posterior - Skin Abnormality Type: Surgical incision (04/01/21 12:45:00)  Neck Posterior - Wound Associated Pain: With activity, mobilization (04/03/21 10:00:00)  Neck Posterior - Wound Associated Pain: With activity, mobilization (04/03/21 07:00:00)  Neck Posterior - Wound Associated Pain: With activity, mobilization (04/02/21 03:00:00)  Neck Posterior - Wound Edge: Approximated with sutures (04/02/21 19:00:00)  Neck Posterior - Wound Exudate Amount: Moderate (04/03/21 10:00:00)  Neck Posterior - Wound Exudate Amount: Moderate (04/03/21 07:00:00)  Neck Posterior - Wound Exudate Amount: None (04/02/21 19:46:00)  Neck Posterior - Wound Exudate Amount: None (04/02/21 19:00:00)  Neck Posterior - Wound Exudate Amount: None (04/02/21 13:00:00)  Neck Posterior - Wound Exudate Amount: None (04/02/21 03:00:00)  Neck Posterior - Wound Exudate Amount: None (04/01/21 20:00:00)  Neck Posterior - Wound Exudate Amount: None (04/01/21 14:51:00)  Neck Posterior - Wound Exudate Odor: None (04/03/21 10:00:00)  Neck Posterior - Wound Exudate Type: Serosanguineous (04/03/21 10:00:00)  Neck Posterior - Wound Exudate Type: Serosanguineous (04/03/21 07:00:00)     Logan Longs E - 04/04/2021 8:16 EDT   Functional Status   Sensory Deficits :   None   Languages :   English   Preferred Mode of Communication :   Verbal   Supports in Hospital? :   Glasses   Communication Supports in Hospital :   patient has a CPAP but does not use   Functional Status Results :   Rehab Functional Status Grouper  ADL Index Score: 8 (04/03/21 20:00:00)  ADL Index Score: 8 (04/03/21 07:00:00)  ADL Index Score: 8 (04/02/21  19:45:00)  ADL Index Score: 8  (04/02/21 09:00:00)  ADL Index Score: 7 (04/01/21 20:00:00)  ADL Index Score: 7 (04/01/21 14:51:00)  ADLs: Moderate assist (04/03/21 20:00:00)  ADLs: Moderate assist (04/03/21 07:00:00)  ADLs: Moderate assist (04/02/21 19:45:00)  ADLs: Moderate assist (04/02/21 09:00:00)  ADLs: Moderate assist (04/01/21 20:00:00)  ADLs: Moderate assist (04/01/21 14:51:00)  Ambulation Assistance: Two person assistance (04/03/21 18:15:00)  Ambulation Distance: 2 ft (04/03/21 18:15:00)  Antiembolism Device: Intermittent pneumatic compression devices, knee high (04/04/21 07:59:00)  Antiembolism Device: Intermittent pneumatic compression devices, knee high (04/04/21 07:41:00)  Antiembolism Device: Intermittent pneumatic compression devices, knee high (04/04/21 06:13:00)  Antiembolism Device: Intermittent pneumatic compression devices, knee high (04/04/21 05:00:00)  Antiembolism Device: Intermittent pneumatic compression devices, knee high (04/04/21 04:09:00)  Antiembolism Device: Intermittent pneumatic compression devices, knee high (04/04/21 04:05:00)  Antiembolism Device: Intermittent pneumatic compression devices, knee high (04/04/21 03:00:00)  Antiembolism Device: Intermittent pneumatic compression devices, knee high (04/04/21 02:00:00)  Antiembolism Device: Intermittent pneumatic compression devices, knee high (04/04/21 01:53:00)  Antiembolism Device: Intermittent pneumatic compression devices, knee high (04/04/21 00:40:00)  Antiembolism Device: Intermittent pneumatic compression devices, knee high (04/04/21 00:00:00)  Antiembolism Device: Intermittent pneumatic compression devices, knee high (04/03/21 23:00:00)  Antiembolism Device: Intermittent pneumatic compression devices, knee high (04/03/21 22:00:00)  Antiembolism Device: Intermittent pneumatic compression devices, knee high (04/03/21 20:45:00)  Antiembolism Device: Intermittent pneumatic compression devices, knee high (04/03/21 19:58:00)  Antiembolism Device: Intermittent  pneumatic compression devices, knee high (04/03/21 19:00:00)  Antiembolism Device: Intermittent pneumatic compression devices, knee high (04/03/21 18:36:00)  Antiembolism Device: Intermittent pneumatic compression devices, knee high (04/03/21 18:15:00)  Antiembolism Device: Intermittent pneumatic compression devices, knee high (04/03/21 17:36:00)  Antiembolism Device: Intermittent pneumatic compression devices, knee high (04/03/21 16:29:00)  Antiembolism Device: Intermittent pneumatic compression devices, knee high (04/03/21 14:22:00)  Antiembolism Device: Intermittent pneumatic compression devices, knee high (04/03/21 13:43:00)  Antiembolism Device: Intermittent pneumatic compression devices, knee high (04/03/21 11:25:00)  Antiembolism Device: Intermittent pneumatic compression devices, knee high (04/03/21 09:22:00)  Antiembolism Device: Intermittent pneumatic compression devices, knee high (04/03/21 08:10:00)  Antiembolism Device: Intermittent pneumatic compression devices, knee high (04/03/21 07:40:00)  Antiembolism Device: Intermittent pneumatic compression devices, knee high (04/03/21 07:30:00)  Antiembolism Device: Intermittent pneumatic compression devices, knee high (04/03/21 05:00:00)  Antiembolism Device: Intermittent pneumatic compression devices, knee high (04/03/21 03:36:00)  Antiembolism Device: Intermittent pneumatic compression devices, knee high (04/03/21 02:57:00)  Antiembolism Device: Intermittent pneumatic compression devices, knee high (04/03/21 00:00:00)  Antiembolism Device: Intermittent pneumatic compression devices, knee high (04/02/21 23:59:00)  Antiembolism Device: Intermittent pneumatic compression devices, knee high (04/02/21 21:20:00)  Antiembolism Device: Intermittent pneumatic compression devices, knee high (04/02/21 20:00:00)  Antiembolism Device: Intermittent pneumatic compression devices, knee high (04/02/21 19:10:00)  Antiembolism Device: Intermittent pneumatic compression  devices, knee high (04/02/21 16:15:00)  Antiembolism Device: Intermittent pneumatic compression devices, knee high (04/02/21 11:32:00)  Antiembolism Device: Intermittent pneumatic compression devices, knee high (04/02/21 09:53:00)  Antiembolism Device: Intermittent pneumatic compression devices, knee high (04/02/21 09:20:00)  Antiembolism Device: Intermittent pneumatic compression devices, knee high (04/02/21 07:53:00)  Antiembolism Device: Intermittent pneumatic compression devices, knee high (04/02/21 07:51:00)  Antiembolism Device: Intermittent pneumatic compression devices, knee high (04/02/21 06:31:00)  Antiembolism Device: Intermittent pneumatic compression devices, knee high (04/02/21 05:23:00)  Antiembolism Device: Intermittent pneumatic compression devices, knee high (04/02/21 03:30:00)  Antiembolism Device: Intermittent pneumatic compression devices, knee high (04/02/21 02:07:00)  Antiembolism Device: Intermittent pneumatic compression devices, knee high (04/02/21 01:33:00)  Antiembolism Device: Intermittent pneumatic compression devices, knee high (04/02/21 00:00:00)  Antiembolism Device: Intermittent pneumatic compression devices,  knee high (04/01/21 22:21:00)  Antiembolism Device: Intermittent pneumatic compression devices, knee high (04/01/21 20:11:00)  Antiembolism Device: Intermittent pneumatic compression devices, knee high (04/01/21 19:29:00)  Antiembolism Device: Intermittent pneumatic compression devices, knee high (04/01/21 17:22:00)  Antiembolism Device: Intermittent pneumatic compression devices, knee high (04/01/21 17:16:00)  Antiembolism Device: Intermittent pneumatic compression devices, knee high (04/01/21 15:23:00)  Antiembolism Device: Intermittent pneumatic compression devices, knee high (04/01/21 14:45:00)  Antiembolism Device: Intermittent pneumatic compression devices, knee high (04/01/21 07:00:00)  [place] ADL Index: Requires assistance (1) (04/03/21 20:00:00)  [place] ADL Index:  Requires assistance (1) (04/03/21 07:00:00)  [place] ADL Index: Requires assistance (1) (04/02/21 19:45:00)  [place] ADL Index: Requires assistance (1) (04/02/21 09:00:00)  [place] ADL Index: Requires assistance (1) (04/01/21 20:00:00)  [place] ADL Index: Requires assistance (1) (04/01/21 14:51:00)  Bed Mobility Assistance: One person assistance (04/04/21 07:59:00)  Bed Mobility Assistance: One person assistance (04/04/21 07:41:00)  Bed Mobility Assistance: One person assistance (04/04/21 06:13:00)  Bed Mobility Assistance: One person assistance (04/04/21 05:00:00)  Bed Mobility Assistance: One person assistance (04/04/21 04:09:00)  Bed Mobility Assistance: One person assistance (04/04/21 04:05:00)  Bed Mobility Assistance: One person assistance (04/04/21 03:00:00)  Bed Mobility Assistance: One person assistance (04/04/21 02:00:00)  Bed Mobility Assistance: One person assistance (04/04/21 01:53:00)  Bed Mobility Assistance: One person assistance (04/04/21 00:40:00)  Bed Mobility Assistance: One person assistance (04/04/21 00:00:00)  Bed Mobility Assistance: One person assistance (04/03/21 23:00:00)  Bed Mobility Assistance: One person assistance (04/03/21 22:00:00)  Bed Mobility Assistance: One person assistance (04/03/21 20:45:00)  Bed Mobility Assistance: Independent (04/03/21 19:58:00)  Bed Mobility Assistance: One person assistance (04/03/21 19:00:00)  Bed Mobility Assistance: Independent (04/03/21 18:36:00)  Bed Mobility Assistance: Independent (04/03/21 18:15:00)  Bed Mobility Assistance: Independent (04/03/21 17:36:00)  Bed Mobility Assistance: Independent (04/03/21 16:29:00)  Bed Mobility Assistance: Independent (04/03/21 14:22:00)  Bed Mobility Assistance: Independent (04/03/21 13:43:00)  Bed Mobility Assistance: Independent (04/03/21 11:25:00)  Bed Mobility Assistance: Independent (04/03/21 09:22:00)  Bed Mobility Assistance: Independent (04/03/21 08:10:00)  Bed Mobility Assistance: Independent (04/03/21  07:40:00)  Bed Mobility Assistance: Independent (04/03/21 07:30:00)  Bed Mobility Assistance: Independent (04/03/21 05:00:00)  Bed Mobility Assistance: Independent (04/03/21 03:36:00)  Bed Mobility Assistance: Independent (04/03/21 02:57:00)  Bed Mobility Assistance: Independent (04/03/21 00:00:00)  Bed Mobility Assistance: Independent (04/02/21 23:59:00)  Bed Mobility Assistance: Independent (04/02/21 21:20:00)  Bed Mobility Assistance: Independent (04/02/21 20:00:00)  Bed Mobility Assistance: Independent (04/02/21 19:10:00)  Bed Mobility Assistance: Independent (04/02/21 16:15:00)  Bed Mobility Assistance: Independent (04/02/21 11:32:00)  Bed Mobility Assistance: Independent (04/02/21 09:53:00)  Bed Mobility Assistance: One person assistance (04/02/21 09:20:00)  Bed Mobility Assistance: One person assistance (04/02/21 07:53:00)  Bed Mobility Assistance: One person assistance (04/02/21 07:51:00)  Bed Mobility Assistance: One person assistance (04/02/21 06:31:00)  Bed Mobility Assistance: One person assistance (04/02/21 05:23:00)  Bed Mobility Assistance: One person assistance (04/02/21 03:30:00)  Bed Mobility Assistance: One person assistance (04/02/21 02:07:00)  Bed Mobility Assistance: One person assistance (04/02/21 01:33:00)  Bed Mobility Assistance: One person assistance (04/02/21 00:00:00)  Bed Mobility Assistance: One person assistance (04/01/21 22:21:00)  Bed Mobility Assistance: One person assistance (04/01/21 20:11:00)  Bed Mobility Assistance: One person assistance (04/01/21 19:29:00)  Bed Mobility Assistance: One person assistance (04/01/21 17:16:00)  Bed Mobility Assistance: One person assistance (04/01/21 15:23:00)  Bed Mobility Assistance: One person assistance (04/01/21 14:45:00)  [place] ADL Index: Independent (2) (04/03/21 20:00:00)  [place] ADL Index: Independent (2) (04/03/21 07:00:00)  [place] ADL Index: Independent (2) (04/02/21 19:45:00)  [place] ADL Index: Independent (2)  (04/02/21  09:00:00)  [place] ADL Index: Requires assistance (1) (04/01/21 20:00:00)  [place] ADL Index: Requires assistance (1) (04/01/21 14:51:00)  [place] ADL Index: Requires assistance (1) (04/03/21 20:00:00)  Dressing ADL Index: Requires assistance (1) (04/03/21 07:00:00)  Dressing ADL Index: Requires assistance (1) (04/02/21 19:45:00)  Dressing ADL Index: Requires assistance (1) (04/02/21 09:00:00)  Dressing ADL Index: Requires assistance (1) (04/01/21 20:00:00)  Dressing ADL Index: Requires assistance (1) (04/01/21 14:51:00)  Environmental Safety Implemented: Bed in low position, Wheels locked, Call device within reach, Personal items within reach, Bed exit alarm, Fall risk visual cues in place, Adequate room lighting, Non-slip footwear, Night light, Traffic path in room free of clutter (04/04/21 07:59:00)  Environmental Safety Implemented: Bed in low position, Wheels locked, Call device within reach, Personal items within reach, Bed exit alarm, Fall risk visual cues in place, Adequate room lighting, Non-slip footwear, Night light, Traffic path in room free of clutter (04/04/21 07:41:00)  Environmental Safety Implemented: Bed in low position, Wheels locked, Call device within reach, Personal items within reach, Bed exit alarm, Fall risk visual cues in place, Adequate room lighting, Non-slip footwear, Night light, Attend patient with toileting, Patient specific safety measures, Traffic path in room free of clutter (04/04/21 06:13:00)  Environmental Safety Implemented: Bed in low position, Wheels locked, Call device within reach, Personal items within reach, Bed exit alarm, Fall risk visual cues in place, Adequate room lighting, Non-slip footwear, Night light, Attend patient with toileting, Patient specific safety measures, Traffic path in room free of clutter (04/04/21 05:00:00)  Environmental Safety Implemented: Bed in low position, Wheels locked, Call device within reach, Personal items within reach,  Bed exit alarm, Fall risk visual cues in place, Adequate room lighting, Non-slip footwear, Night light, Attend patient with toileting, Patient specific safety measures, Traffic path in room free of clutter (04/04/21 04:09:00)  Environmental Safety Implemented: Bed in low position, Wheels locked, Call device within reach, Personal items within reach, Bed exit alarm, Fall risk visual cues in place, Adequate room lighting, Non-slip footwear, Night light, Attend patient with toileting, Patient specific safety measures, Traffic path in room free of clutter (04/04/21 04:05:00)  Environmental Safety Implemented: Bed in low position, Wheels locked, Call device within reach, Personal items within reach, Bed exit alarm, Fall risk visual cues in place, Adequate room lighting, Non-slip footwear, Night light, Attend patient with toileting, Patient specific safety measures, Traffic path in room free of clutter (04/04/21 03:00:00)  Environmental Safety Implemented: Bed in low position, Wheels locked, Call device within reach, Personal items within reach, Bed exit alarm, Fall risk visual cues in place, Adequate room lighting, Non-slip footwear, Night light, Attend patient with toileting, Patient specific safety measures, Traffic path in room free of clutter (04/04/21 02:00:00)  Environmental Safety Implemented: Bed in low position, Wheels locked, Call device within reach, Personal items within reach, Bed exit alarm, Fall risk visual cues in place, Adequate room lighting, Non-slip footwear, Night light, Attend patient with toileting, Patient specific safety measures, Traffic path in room free of clutter (04/04/21 01:53:00)  Environmental Safety Implemented: Bed in low position, Wheels locked, Call device within reach, Personal items within reach, Bed exit alarm, Fall risk visual cues in place, Adequate room lighting, Non-slip footwear, Night light, Attend patient with toileting, Patient specific safety measures, Traffic path in room  free of clutter (04/04/21 00:40:00)  Environmental Safety Implemented: Bed in low position, Wheels locked, Call device within reach, Personal items within reach, Bed exit alarm, Fall risk visual cues in place, Adequate room lighting, Non-slip  footwear, Night light, Attend patient with toileting, Patient specific safety measures, Traffic path in room free of clutter (04/04/21 00:00:00)  Environmental Safety Implemented: Bed in low position, Wheels locked, Call device within reach, Personal items within reach, Bed exit alarm, Fall risk visual cues in place, Adequate room lighting, Non-slip footwear, Night light, Attend patient with toileting, Patient specific safety measures, Traffic path in room free of clutter (04/03/21 23:00:00)  Environmental Safety Implemented: Bed in low position, Wheels locked, Call device within reach, Personal items within reach, Bed exit alarm, Fall risk visual cues in place, Adequate room lighting, Non-slip footwear, Night light, Attend patient with toileting, Patient specific safety measures, Traffic path in room free of clutter (04/03/21 22:00:00)  Environmental Safety Implemented: Bed in low position, Wheels locked, Call device within reach, Personal items within reach, Bed exit alarm, Fall risk visual cues in place, Adequate room lighting, Non-slip footwear, Night light, Attend patient with toileting, Patient specific safety measures, Traffic path in room free of clutter (04/03/21 20:45:00)  Environmental Safety Implemented: Bed in low position, Wheels locked, Call device within reach, Personal items within reach, Bed exit alarm, Fall risk visual cues in place, Adequate room lighting, Non-slip footwear, Night light, Attend patient with toileting, Patient specific safety measures, Traffic path in room free of clutter (04/03/21 19:58:00)  Environmental Safety Implemented: Bed in low position, Wheels locked, Call device within reach, Personal items within reach, Bed exit alarm, Fall risk  visual cues in place, Adequate room lighting, Non-slip footwear, Night light, Attend patient with toileting, Patient specific safety measures, Traffic path in room free of clutter (04/03/21 19:00:00)  Environmental Safety Implemented: Bed in low position, Wheels locked, Call device within reach, Personal items within reach, Bed exit alarm, Fall risk visual cues in place, Adequate room lighting, Non-slip footwear, Night light, Attend patient with toileting, Patient specific safety measures, Traffic path in room free of clutter (04/03/21 18:36:00)  Environmental Safety Implemented: Bed in low position, Wheels locked, Call device within reach, Personal items within reach, Bed exit alarm, Fall risk visual cues in place, Adequate room lighting, Non-slip footwear, Night light, Attend patient with toileting, Patient specific safety measures, Traffic path in room free of clutter (04/03/21 18:15:00)  Environmental Safety Implemented: Bed in low position, Wheels locked, Call device within reach, Personal items within reach, Bed exit alarm, Fall risk visual cues in place, Adequate room lighting, Non-slip footwear, Night light, Attend patient with toileting, Patient specific safety measures, Traffic path in room free of clutter (04/03/21 17:36:00)  Environmental Safety Implemented: Bed in low position, Wheels locked, Call device within reach, Personal items within reach, Bed exit alarm, Fall risk visual cues in place, Adequate room lighting, Non-slip footwear, Night light, Attend patient with toileting, Patient specific safety measures, Traffic path in room free of clutter (04/03/21 16:29:00)  Environmental Safety Implemented: Bed in low position, Wheels locked, Call device within reach, Personal items within reach, Bed exit alarm, Fall risk visual cues in place, Adequate room lighting, Non-slip footwear, Night light, Attend patient with toileting, Patient specific safety measures, Traffic path in room free of clutter (04/03/21  14:22:00)  Environmental Safety Implemented: Bed in low position, Wheels locked, Call device within reach, Personal items within reach, Bed exit alarm, Fall risk visual cues in place, Adequate room lighting, Non-slip footwear, Night light, Attend patient with toileting, Patient specific safety measures, Traffic path in room free of clutter (04/03/21 13:43:00)  Environmental Safety Implemented: Bed in low position, Wheels locked, Call device within reach, Personal items within  reach, Bed exit alarm, Fall risk visual cues in place, Adequate room lighting, Non-slip footwear, Night light, Attend patient with toileting, Patient specific safety measures, Traffic path in room free of clutter (04/03/21 11:25:00)  Environmental Safety Implemented: Bed in low position, Wheels locked, Call device within reach, Personal items within reach, Bed exit alarm, Fall risk visual cues in place, Adequate room lighting, Non-slip footwear, Night light, Attend patient with toileting, Patient specific safety measures, Traffic path in room free of clutter (04/03/21 09:22:00)  Environmental Safety Implemented: Bed in low position, Wheels locked, Call device within reach, Personal items within reach, Bed exit alarm, Fall risk visual cues in place, Adequate room lighting, Non-slip footwear, Night light, Attend patient with toileting, Patient specific safety measures, Traffic path in room free of clutter (04/03/21 08:10:00)  Environmental Safety Implemented: Bed in low position, Wheels locked, Call device within reach, Personal items within reach, Bed exit alarm, Fall risk visual cues in place, Adequate room lighting, Non-slip footwear, Night light, Attend patient with toileting, Patient specific safety measures, Traffic path in room free of clutter (04/03/21 07:40:00)  Environmental Safety Implemented: Bed in low position, Wheels locked, Call device within reach, Personal items within reach, Bed exit alarm, Fall risk visual cues in place,  Adequate room lighting, Non-slip footwear, Night light, Attend patient with toileting, Patient specific safety measures, Traffic path in room free of clutter (04/03/21 07:30:00)  Environmental Safety Implemented: Bed in low position, Wheels locked, Call device within reach, Personal items within reach, Bed exit alarm, Fall risk visual cues in place, Adequate room lighting, Non-slip footwear, Night light, Traffic path in room free of clutter (04/03/21 05:00:00)  Environmental Safety Implemented: Bed in low position, Wheels locked, Call device within reach, Personal items within reach, Bed exit alarm, Fall risk visual cues in place, Adequate room lighting, Non-slip footwear, Night light, Traffic path in room free of clutter (04/03/21 03:36:00)  Environmental Safety Implemented: Bed in low position, Wheels locked, Call device within reach, Personal items within reach, Bed exit alarm, Fall risk visual cues in place, Adequate room lighting, Non-slip footwear, Night light, Traffic path in room free of clutter (04/03/21 02:57:00)  Environmental Safety Implemented: Bed in low position, Wheels locked, Call device within reach, Personal items within reach, Bed exit alarm, Fall risk visual cues in place, Adequate room lighting, Non-slip footwear, Night light, Traffic path in room free of clutter (04/03/21 00:00:00)  Environmental Safety Implemented: Bed in low position, Wheels locked, Call device within reach, Personal items within reach, Bed exit alarm, Fall risk visual cues in place, Adequate room lighting, Non-slip footwear, Night light, Traffic path in room free of clutter (04/02/21 23:59:00)  Environmental Safety Implemented: Bed in low position, Wheels locked, Call device within reach, Personal items within reach, Bed exit alarm, Fall risk visual cues in place, Adequate room lighting, Non-slip footwear, Night light, Traffic path in room free of clutter (04/02/21 21:20:00)  Environmental Safety Implemented: Bed in low  position, Wheels locked, Call device within reach, Personal items within reach, Bed exit alarm, Fall risk visual cues in place, Adequate room lighting, Non-slip footwear, Night light, Traffic path in room free of clutter (04/02/21 20:00:00)  Environmental Safety Implemented: Bed in low position, Wheels locked, Call device within reach, Personal items within reach, Bed exit alarm, Fall risk visual cues in place, Adequate room lighting, Non-slip footwear, Night light, Traffic path in room free of clutter (04/02/21 19:10:00)  Environmental Safety Implemented: Bed in low position, Wheels locked, Call device within reach, Personal items  within reach, Bed exit alarm, Fall risk visual cues in place, Adequate room lighting, Non-slip footwear, Night light, Traffic path in room free of clutter (04/02/21 16:15:00)  Environmental Safety Implemented: Bed in low position, Wheels locked, Call device within reach, Personal items within reach, Bed exit alarm, Fall risk visual cues in place, Adequate room lighting, Non-slip footwear, Night light, Traffic path in room free of clutter (04/02/21 11:32:00)  Environmental Safety Implemented: Bed in low position, Wheels locked, Call device within reach, Personal items within reach, Bed exit alarm, Fall risk visual cues in place, Adequate room lighting, Non-slip footwear, Night light, Traffic path in room free of clutter (04/02/21 09:53:00)  Environmental Safety Implemented: Bed in low position, Wheels locked, Call device within reach, Personal items within reach, Bed exit alarm, Fall risk visual cues in place, Adequate room lighting, Non-slip footwear, Night light, Traffic path in room free of clutter (04/02/21 09:20:00)  Environmental Safety Implemented: Bed in low position, Wheels locked, Call device within reach, Personal items within reach, Bed exit alarm, Fall risk visual cues in place, Adequate room lighting, Non-slip footwear, Night light, Traffic path in room free of clutter  (04/02/21 07:53:00)  Environmental Safety Implemented: Bed in low position, Wheels locked, Call device within reach, Personal items within reach, Bed exit alarm, Fall risk visual cues in place, Adequate room lighting, Non-slip footwear, Night light, Traffic path in room free of clutter (04/02/21 07:51:00)  Environmental Safety Implemented: Bed in low position, Wheels locked, Call device within reach, Personal items within reach, Bed exit alarm, Wander alert device alarm, Fall risk visual cues in place, Adequate room lighting, Non-slip footwear, Attend patient with toileting, Patient specific safety measures, Traffic path in room free of clutter, Encourage personal mobility support item use, Mobility support items readily available (04/02/21 06:31:00)  Environmental Safety Implemented: Bed in low position, Wheels locked, Call device within reach, Personal items within reach, Bed exit alarm, Wander alert device alarm, Fall risk visual cues in place, Adequate room lighting, Non-slip footwear, Attend patient with toileting, Patient specific safety measures, Traffic path in room free of clutter, Encourage personal mobility support item use, Mobility support items readily available (04/02/21 05:23:00)  Environmental Safety Implemented: Bed in low position, Wheels locked, Call device within reach, Personal items within reach, Bed exit alarm, Wander alert device alarm, Fall risk visual cues in place, Adequate room lighting, Non-slip footwear, Attend patient with toileting, Patient specific safety measures, Traffic path in room free of clutter, Encourage personal mobility support item use, Mobility support items readily available (04/02/21 03:30:00)  Environmental Safety Implemented: Bed in low position, Wheels locked, Call device within reach, Personal items within reach, Bed exit alarm, Wander alert device alarm, Fall risk visual cues in place, Adequate room lighting, Non-slip footwear, Attend patient with toileting,  Patient specific safety measures, Traffic path in room free of clutter, Encourage personal mobility support item use, Mobility support items readily available (04/02/21 02:07:00)  Environmental Safety Implemented: Bed in low position, Wheels locked, Call device within reach, Personal items within reach, Bed exit alarm, Wander alert device alarm, Fall risk visual cues in place, Adequate room lighting, Non-slip footwear, Attend patient with toileting, Patient specific safety measures, Traffic path in room free of clutter, Encourage personal mobility support item use, Mobility support items readily available (04/02/21 01:33:00)  Environmental Safety Implemented: Bed in low position, Wheels locked, Call device within reach, Personal items within reach, Bed exit alarm, Wander alert device alarm, Fall risk visual cues in place, Adequate room lighting, Non-slip footwear,  Attend patient with toileting, Patient specific safety measures, Traffic path in room free of clutter, Encourage personal mobility support item use, Mobility support items readily available (04/02/21 00:00:00)  Environmental Safety Implemented: Bed in low position, Wheels locked, Call device within reach, Personal items within reach, Bed exit alarm, Wander alert device alarm, Fall risk visual cues in place, Adequate room lighting, Non-slip footwear, Attend patient with toileting, Patient specific safety measures, Traffic path in room free of clutter, Encourage personal mobility support item use, Mobility support items readily available (04/01/21 22:21:00)  Environmental Safety Implemented: Bed in low position, Wheels locked, Call device within reach, Personal items within reach, Bed exit alarm, Wander alert device alarm, Fall risk visual cues in place, Adequate room lighting, Non-slip footwear, Attend patient with toileting, Patient specific safety measures, Traffic path in room free of clutter, Encourage personal mobility support item use, Mobility  support items readily available (04/01/21 20:11:00)  Environmental Safety Implemented: Bed in low position, Wheels locked, Call device within reach, Personal items within reach, Bed exit alarm, Wander alert device alarm, Fall risk visual cues in place, Adequate room lighting, Non-slip footwear, Attend patient with toileting, Patient specific safety measures, Traffic path in room free of clutter, Encourage personal mobility support item use, Mobility support items readily available (04/01/21 19:29:00)  Environmental Safety Implemented: Bed in low position, Wheels locked, Call device within reach, Personal items within reach, Bed exit alarm, Fall risk visual cues in place, Adequate room lighting, Non-slip footwear, Night light, Traffic path in room free of clutter (04/01/21 17:22:00)  Environmental Safety Implemented: Bed in low position, Wheels locked, Call device within reach, Personal items within reach, Bed exit alarm, Fall risk visual cues in place, Adequate room lighting, Non-slip footwear, Night light, Traffic path in room free of clutter (04/01/21 17:16:00)  Environmental Safety Implemented: Bed in low position, Wheels locked, Call device within reach, Personal items within reach, Bed exit alarm, Fall risk visual cues in place, Adequate room lighting, Non-slip footwear, Night light, Traffic path in room free of clutter (04/01/21 15:23:00)  Environmental Safety Implemented: Bed in low position, Wheels locked, Call device within reach, Personal items within reach, Bed exit alarm, Fall risk visual cues in place, Adequate room lighting, Non-slip footwear, Patient specific safety measures, Traffic path in room free of clutter, Encourage personal mobility support item use (04/01/21 14:45:00)  Environmental Safety Implemented: Bed in low position, Wheels locked, Call device within reach, Personal items within reach (04/01/21 07:00:00)  Feeding ADL Index: Independent (2) (04/03/21 20:00:00)  Feeding ADL Index:  Independent (2) (04/03/21 07:00:00)  Feeding ADL Index: Independent (2) (04/02/21 19:45:00)  Feeding ADL Index: Independent (2) (04/02/21 09:00:00)  Feeding ADL Index: Independent (2) (04/01/21 20:00:00)  Feeding ADL Index: Independent (2) (04/01/21 14:51:00)  Head of Bed Position: 30 - 45 Degrees (semi-Fowlers) (04/04/21 07:59:00)  Head of Bed Position: 30 - 45 Degrees (semi-Fowlers) (04/04/21 07:41:00)  Head of Bed Position: 30 - 45 Degrees (semi-Fowlers) (04/04/21 06:13:00)  Head of Bed Position: 30 - 45 Degrees (semi-Fowlers) (04/04/21 05:00:00)  Head of Bed Position: 30 - 45 Degrees (semi-Fowlers) (04/04/21 04:09:00)  Head of Bed Position: 30 - 45 Degrees (semi-Fowlers) (04/04/21 04:05:00)  Head of Bed Position: 30 - 45 Degrees (semi-Fowlers) (04/04/21 03:00:00)  Head of Bed Position: 30 - 45 Degrees (semi-Fowlers) (04/04/21 02:00:00)  Head of Bed Position: 30 - 45 Degrees (semi-Fowlers) (04/04/21 01:53:00)  Head of Bed Position: 30 - 45 Degrees (semi-Fowlers) (04/04/21 00:40:00)  Head of Bed Position: 30 - 45 Degrees (semi-Fowlers) (  04/04/21 00:00:00)  Head of Bed Position: 30 - 45 Degrees (semi-Fowlers) (04/03/21 23:00:00)  Head of Bed Position: 30 - 45 Degrees (semi-Fowlers) (04/03/21 22:00:00)  Head of Bed Position: 30 - 45 Degrees (semi-Fowlers) (04/03/21 20:45:00)  Head of Bed Position: 45 - 80 Degrees (04/03/21 19:58:00)  Head of Bed Position: 30 - 45 Degrees (semi-Fowlers) (04/03/21 19:00:00)  Head of Bed Position: 45 - 80 Degrees (04/03/21 18:36:00)  Head of Bed Position: 45 - 80 Degrees (04/03/21 18:15:00)  Head of Bed Position: 30 - 45 Degrees (semi-Fowlers) (04/03/21 17:36:00)  Head of Bed Position: 30 - 45 Degrees (semi-Fowlers) (04/03/21 16:29:00)  Head of Bed Position: 30 - 45 Degrees (semi-Fowlers) (04/03/21 14:22:00)  Head of Bed Position: 30 - 45 Degrees (semi-Fowlers) (04/03/21 13:43:00)  Head of Bed Position: 30 - 45 Degrees (semi-Fowlers) (04/03/21 11:25:00)  Head of Bed Position: 30 - 45  Degrees (semi-Fowlers) (04/03/21 09:22:00)  Head of Bed Position: 30 - 45 Degrees (semi-Fowlers) (04/03/21 08:10:00)  Head of Bed Position: 30 - 45 Degrees (semi-Fowlers) (04/03/21 07:40:00)  Head of Bed Position: 30 - 45 Degrees (semi-Fowlers) (04/03/21 07:30:00)  Head of Bed Position: 30 - 45 Degrees (semi-Fowlers) (04/03/21 05:00:00)  Head of Bed Position: 30 - 45 Degrees (semi-Fowlers) (04/03/21 03:36:00)  Head of Bed Position: 30 - 45 Degrees (semi-Fowlers) (04/03/21 02:57:00)  Head of Bed Position: 30 - 45 Degrees (semi-Fowlers) (04/03/21 00:00:00)  Head of Bed Position: 30 - 45 Degrees (semi-Fowlers) (04/02/21 23:59:00)  Head of Bed Position: 30 - 45 Degrees (semi-Fowlers) (04/02/21 21:20:00)  Head of Bed Position: 30 - 45 Degrees (semi-Fowlers) (04/02/21 20:00:00)  Head of Bed Position: 30 - 45 Degrees (semi-Fowlers) (04/02/21 19:10:00)  Head of Bed Position: 30 - 45 Degrees (semi-Fowlers) (04/02/21 16:15:00)  Head of Bed Position: 30 - 45 Degrees (semi-Fowlers) (04/02/21 11:32:00)  Head of Bed Position: 30 - 45 Degrees (semi-Fowlers) (04/02/21 09:53:00)  Head of Bed Position: 45 - 80 Degrees (04/02/21 09:20:00)  Head of Bed Position: 45 - 80 Degrees (04/02/21 07:53:00)  Head of Bed Position: 45 - 80 Degrees (04/02/21 07:51:00)  Head of Bed Position: 45 - 80 Degrees (04/02/21 06:31:00)  Head of Bed Position: 45 - 80 Degrees (04/02/21 05:23:00)  Head of Bed Position: 45 - 80 Degrees (04/02/21 03:30:00)  Head of Bed Position: 45 - 80 Degrees (04/02/21 02:07:00)  Head of Bed Position: 45 - 80 Degrees (04/02/21 01:33:00)  Head of Bed Position: 45 - 80 Degrees (04/02/21 00:00:00)  Head of Bed Position: 45 - 80 Degrees (04/01/21 22:21:00)  Head of Bed Position: 45 - 80 Degrees (10/24     Logan Longs E - 04/04/2021 8:16 EDT   Premorbid Medication Self Management   Bathing :   Complete independence   Bed Mobility :   Complete independence   Bed Wheelchair Transfer :   Does not occur   Bladder :   Complete  independence   Bowel :   Complete independence   Eating :   Complete independence   Grooming :   Complete independence   Locomotion Walk :   Complete independence   Locomotion Wheelchair :   Does not occur   Lower Extremity Dressing :   Complete independence   Sit to Stand :   Complete independence   Supine to Sit :   Complete independence   Toilet Transfer :   Complete independence   Toileting :   Complete independence   Tub, Shower Transfer :   Complete independence  Upper Extremity Dressing :   Complete independence   Comprehension :   Complete independence   Expression :   Complete independence   Cognition :   Complete independence   Social Interaction :   Complete independence   Medication Self Management :   Complete independence   Logan Rocky BRAVO - 04/04/2021 8:16 EDT   Current Medication Self-Management   Bathing :   Moderate assistance   Logan Rocky BRAVO - 04/04/2021 9:06 EDT     Bed Mobility :   Minimal contact assistance   Bed Wheelchair Transfer :   Does not occur   Logan Rocky BRAVO - 04/04/2021 8:16 EDT   Eating :   Supervision or setup     Grooming :   Supervision or setup   Logan Rocky BRAVO - 04/04/2021 9:06 EDT     Locomotion Walk :   Does not occur   Locomotion Wheelchair :   Does not occur   Logan Rocky BRAVO - 04/04/2021 8:16 EDT   Lower Extremity Dressing :   Moderate assistance   Logan Rocky BRAVO - 04/04/2021 9:06 EDT     Sit to Stand :   Moderate assistance   Supine to Sit :   Moderate assistance   Toilet Transfer :   Does not occur   Toileting :   Does not occur   Logan Rocky E - 04/04/2021 8:16 EDT   Tub, shower transfer :   Minimal contact assistance   Logan Rocky BRAVO - 04/04/2021 9:06 EDT     Upper Extremity Dressing :   Does not occur   Comprehension :   Complete independence   Expression :   Complete independence   Cognition :   Complete independence   Social Interaction :   Complete independence   Medication Self-Management :   Does not occur   Anticipated Rehab Goals    Bathing :   Modified independence   Bed Mobility :   Complete independence   Bed Wheelchair Transfer :   Modified independence   Bladder :   Complete independence   Bowel :   Complete independence   Eating :   Complete independence   Grooming :   Complete independence   Locomotion Walk :   Modified independence   Locomotion Wheelchair :   Does not occur   Lower Extremity Dressing :   Modified independence   Sit to Stand :   Modified independence   Supine to Sit :   Complete independence   Toilet Transfer :   Modified independence   Toileting :   Complete independence   Tub, Shower Transfer :   Modified independence   Upper Extremity Dressing :   Complete independence   Comprehension :   Complete independence   Expression :   Complete independence   Cognition :   Complete independence   Social Interaction :   Complete independence   Medication Self-Management :   Complete independence   Logan Rocky BRAVO - 04/04/2021 8:16 EDT   Comments :   10/26 PT Note:    Assessment   PT Impairments or Limitations :   Ambulation deficits, Balance deficits, Bed mobility deficits, Pain limiting function, Strength deficits, Transfer deficits   Discharge Recommendations :   eval: acute rehab     D/C Transportation Recommendations :   Stretcher/Requires skilled assist for transfer   D/C Transportation Recommendations Reviewed :  Yes   PT Treatment Recommendations :   pt c HOB elevated finishing lunch.  semi-sidelying EOB, attempting to sit upright EOB.  Mod A 1, pt was able to swing LEs over EOB, but unable to come to full sit 2nd to painful neck 10!SABRA  Several attmepts made c pt resting in sidelying briefly between ea. attempt.  Last attempt was pt using therapist's hands to pull up rather than push down on bed c slightly better results.  Supine ap's, quad-sets and glute sets x 10, heel slides x 10, slr & hip abd/add x8 c opposite leg bent knee position.  Positioned to [place] all needs in reach.    After several attempts pt was  unable to reach sitting position EOB.  He did state that yesterday he was feel pain R hip-flank-neck and today was essentially in neck traps only.  rn made aware.  ?if neck brace/soft colloar may help?   Tolerated supine ex well denied pain/strain to low back.  pt cautioned if any strain to lowback to stop ex. Will need rehab to regain independenc before returning home.      10/25 OT Note:    Outpatient Review   OT Clinical Assessment Summary :   Pt would benefit from acute rehab upon d/c.     Rehab Potential Occupational Therapy :   Fair   Rehab Potential OT Fair Due To :   Severity of impairment   OT Impairments or Limitations :   Basic activity of daily living deficits, Mobility deficits, Pain, Sensory deficits, Strength deficits, Other: Post op restrictions.     10/25 OT Note:    Assessment   OT Impairments or Limitations :   Basic activity of daily living deficits, Mobility deficits, Pain, Sensory deficits, Strength deficits, Other: Post op restrictions.   Barriers to Safe Discharge OT :   Severity of deficits   OT Discharge Recommendations :   Pt would benefit from acute rehab upon d/c.     OT Treatment Recommendations :   Pt limited by reported 10/10 pain in neck and R UE.  Pt very focused on receiving more pain medication, but does not demonstrate pain behaviors.  Pt able to stand bedside for approximately 30 secs and began yelling no, no and immediately returned to sidelying.          Logan Rocky BRAVO - 04/04/2021 8:16 EDT   Section GG: Functional Abilities and Goals   Functional Abilities and Goals Grid   GG0100 Self Care :   Independent - Patient/Resident completed the activities by him/herself, with or without an assistive device, with no assistance from a helper. - 3   GG0100 Indoor Mobility (Ambulation) :   Independent - Patient/Resident completed the activities by him/herself, with or without an assistive device, with no assistance from a helper. - 3   GG0100 Stairs :   Independent -  Patient/Resident completed the activities by him/herself, with or without an assistive device, with no assistance from a helper. - 3   GG0100 Functional Cognition :   Independent - Patient/Resident completed the activities by him/herself, with or without an assistive device, with no assistance from a helper. - 3   Logan Rocky BRAVO - 04/04/2021 8:16 EDT   GG0110 Prior Device Use :   No manual or motorized wheelchair or scooter, mechanical lift, walker, or orthotics/prosthetics   Logan Rocky BRAVO - 04/04/2021 8:16 EDT   Home Environment   Home Environment Results :  Home Environment Rehab  Home Equipment Rehab: [place] (04/02/21 10:30:00)  Prior ADL Status: Independent (04/02/21 10:36:00)  Prior ADL Status: Independent (04/02/21 10:30:00)  Prior Mobility Status: Independent (04/02/21 10:36:00)  Prior Mobility Status: Independent (04/02/21 10:30:00)     Pre-Hospital Living Setting :   Home - private home/apartment   Admit Lives With IRF-PAI :   Family or relatives   Lives In :   Single level home   Anticipated Need for Home Modification :   No   Prior Devices/Aids used by Patient :   Straight cane, Shower chair, Elevated toilet seat   Prior Devices/Aids Details :   Patient states used [place] as needed basis only.      Logan Rocky BRAVO - 04/04/2021 8:16 EDT   Rehabilitation Stairs Grid     Inside Stairs  Outside Stairs        Number of Stairs :    0    3            Rail :       Bilateral             Logan Rocky BRAVO - 04/04/2021 8:16 EDT  Logan Rocky BRAVO - 04/04/2021 8:16 EDT        Home Setup Grid   Primary Bedroom :   1st floor   Primary Bathroom :   1st floor   Kitchen :   1st floor   Laundry :   1st floor   Logan Rocky BRAVO - 04/04/2021 8:16 EDT   Patient's Responsibilities Rehab :   Yard work, Copywriter, advertising ADL, Film/video editor, Social participation   Patient's Lifestyle :   Active   Preadm Support System :   Spouse Riker Collier [phone]   Preadm Amt/Type of Assist Caregiver Able to Prov :   24-hour Supervision, Bathing,  Dressing, Hygiene, Lifting, Meals, Medication management, Safety monitoring, Taking to follow up appointment, Toileting   Culture/Spiritual Beliefs to Incorporate :   No   Logan Rocky BRAVO - 04/04/2021 8:16 EDT   Plan   Risk for Clinical Complications and Medical Necessity for Inpatient Acute Rehabilitation Care :   Patient requires medical management/24-hour nursing of complex comorbidities (acute myelopathy, OSA, sciatica, chronic   back pain), labs, medications (see medications list), pain, sleep hygiene, anticoagulation, nutrition, hydration, neurological, pulmonary, and cardiac status, and preventive healthcare.    Patient is at risk for the following complications: DVT/PE, pneumonia, malnutrition, neurological decline, respiratory insufficiency,   worsening activity intolerance, complications from anticoagulation, skin breakdown, inadequate sleep, , and constipation.       Preadm Patient, Caregiver Goal :   Return home with 24/7 CG assistance   Preadm Expected Level of Improvement :   Good   Preadm Patient/Family Agreement :   Yes   Rehab Expected Length of Stay :   13 days   Preadm PT Hours/Day :   1.5   Preadm PT Days/Week :   5   Preadm PT Interventions :   Ambulation, Body mechanics, Endurance, Family training, Mobility, Safety, Stairs, Transfers   Preadm PT Duration :   1 week, 2 weeks   Preadm OT Hours/Day :   1.5   Preadm OT Days/Week :   5   Preadm OT Interventions :   Bathing, Body mechanics, Dressing, Endurance, Family training, Feeding, Grooming, Safety, Toileting, Transfers   Preadm OT Duration :   1 week, 2 weeks   Preadm SLP Hours/Day :   N/A  Preadm SLP Days/Week :   0   Preadm SLP Interventions :   N/A   Rehab Nursing Care :   24/7   Preadm Nursing Interventions :   Autonomic system, Bowel-bladder management, Family training, Health management, Medication management, Pain management, Reinforcement of self-care/mobility skills, Safety, Sleep, Toileting   Preadm Neuropsychology Hours/Day :    N/A   Preadm Neuropsychology Days/Week :   N/A   Preadm Neuropsychology Interventions :   N/A   Preadm Prosthetics/Orthotics Hrs/Day :   N/A   Preadm Prosthetics/Orthotics Days/Week :   N/A   Logan Rocky BRAVO - 04/04/2021 8:16 EDT   Additional Information   Preadm Additional Information :   PACC met with patient at bedside and reviewed [facility] Guidelines. Patient insists upon only returning to [facility] 8th floor as he was there in 2021.  DC plan: Return home with wife Alisa who will provide 24/7 CG support     Logan Rocky BRAVO - 04/04/2021 8:16 EDT   Benefits   Other Benefits :   Primary: MCR  Secondary: BCBS out of [place] with PPO   Insurance Information :   Medicare   Logan Rocky BRAVO - 04/04/2021 8:16 EDT   Preadmission Review   Appropriate for Inpatient Rehab Hospital Admission :   Willing to participate in prescribed intensity of care, Able to actively participate in 15 hours of therapy over 7 days, Medical conditions can be managed in the inpt rehab unit, Patient requires an intensive level of rehab services   Estimated Length of Stay :   10 to 14 days   Rationale for Admission to Inpt Rehab :   Requires intense therapy and an integrated, interdisciplinary approach to address safety, impaired mobility,  impaired ADLs,  preventive healthcare, medication management, home caregiver support and training and integration of improving functional skills into daily living due to cervical myelopathy status post decompression     Preadm Prognosis :   Fair   Preadm Prognosis Fair Due To :   Severity of impairment   Requires Inpatient Rehab Services :   Physical Therapy, Occupational Therapy, Nursing, Psychology, Case Management, Therapeutic Recreation, Registered Dietitian   Anticipated Discharge Destination :   Home under care of organized home health service org   Anticipated Discharge Services :   Physical Therapy, Occupational Therapy, Nursing   Physician Review of Preadmission Screening :   I agree with the preadmission  screening as documented   DONOVAN,  JAYNE M-MD - 04/04/2021 10:50 EDT

## 2021-04-04 NOTE — Case Communication (Signed)
CM Discharge Planning Assessment - Text       CM Discharge Plan Entered On:  04/09/21 13:43 EDT    Performed On:  04/09/2021 13:42 EDT by Elie Confer M-RN               CM Discharge Plan   Discharge To :   Acute Rehab   CM Discharge Services :   Acute Rehab   CM Discharge Service Agency Selected :   [facility]   CM Family/POA/Guardian Notified :   Yes   CM Person Notified of Discharge :   spouse   CM Home/Lay Caregiver Name/Relationship :   Hope, Holst [phone]   [facility] Services :   Mercie Eon     CM Date of 3rd MN Occurrence :   04/06/2021 0:00 EDT   CM Progress Note :   04/02/21 TS MOON OBS Notice @1330  verbally. Pt verbalized understanding, copy to pt/chart. 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 Pt lives with Donatello Kleve [phone] who will provide transportation home. Pt is IADLs, but spouse sometimes helps with socks. No DME and not current with any HH. PCP is Dr. [phone] and Rxs with [facility] or [facility] in [place]. CM will cont to follow for dispo pending clinical course.     10/26: Patient changed from OBS to IP. Im signed and copy given. (JLD)  ***PT recommends acte rehab at [place]. Patient states he went to [facility] last year. He wants to speak with  his spouse prior to making frcision. Call back number for CM was given. CM to follow. (JLD)  ***CM spoke with patietn and he would like [facility]. Referral sent. (JLD)    04/10/2023: Patient to Dc to [facility] today at 1400. Patient, patient's spouse, RN [facility] and Ashok Cordia at [place] are aware of transport time. All paperwork faxed to [facility] (JLD)     Discharge Planning Assessment Complete :   Yes   [facility] M-RN - 04/09/2021 13:42 EDT   Z Codes   Z code documentation :   Not applicable   04/06/2021 M-RN - 04/09/21 13:42 EDT

## 2021-04-04 NOTE — Nursing Note (Signed)
 Adult Patient History Form-Text       Adult Patient History Entered On:  04/04/2021 17:03 EDT    Performed On:  04/04/2021 16:58 EDT by Alix Sally M-RN               General Info   Patient Identified :   Identification band   Patient Identified :   Jesse Savage   Information Given By :   Self   Preferred Mode of Communication :   Verbal, Written   Pregnancy Status :   N/A   Has the patient received chemotherapy or immunotherapy (cytotoxic)  in the last 48-72 hours? :   No   Is the patient currently (2-3 days) receiving radiation treatment? :   No   Alix Sally M-RN - 04/04/2021 16:58 EDT   Allergies   (As Of: 04/04/2021 17:03:47 EDT)   Allergies (Active)   codeine  Estimated Onset Date:   Unspecified ; Reactions:   NA ; Created By:   CLARISE LOT D; Reaction Status:   Active ; Category:   Drug ; Substance:   codeine ; Type:   Allergy ; Severity:   Unknown ; Updated By:   CLARISE LOT D; Reviewed Date:   04/04/2021 14:10 EDT      Cymbalta  Estimated Onset Date:   Unspecified ; Reactions:   NA ; Created By:   CLARISE LOT D; Reaction Status:   Active ; Category:   Drug ; Substance:   Cymbalta ; Type:   Allergy ; Severity:   Unknown ; Updated By:   CLARISE LOT D; Reviewed Date:   04/04/2021 14:10 EDT      Neurontin  Estimated Onset Date:   Unspecified ; Reactions:   NA ; Created By:   CLARISE LOT D; Reaction Status:   Active ; Category:   Drug ; Substance:   Neurontin ; Type:   Allergy ; Severity:   Unknown ; Updated By:   MCMURRAY,  NATHAN D; Reviewed Date:   04/04/2021 14:10 EDT        Medication History   Medication List   (As Of: 04/04/2021 17:03:47 EDT)   Normal Order    Sodium Chloride  0.45% intravenous solution 1,000 mL  :   Sodium Chloride  0.45% intravenous solution 1,000 mL ; Status:   Ordered ; Ordered As Mnemonic:   Sodium Chloride  0.45% 1,000 mL ; Simple Display Line:   100 mL/hr, IV ; Ordering Provider:   NICHOLAUS SUZEN ASKEW; Catalog Code:   Sodium Chloride  0.45% ;  Order Dt/Tm:   04/01/2021 14:57:34 EDT          Lactated Ringers  Injection solution 1000 mL  :   Lactated Ringers  Injection solution 1000 mL ; Status:   Ordered ; Ordered As Mnemonic:   Lactated Ringers  Injection 1000 mL ; Simple Display Line:   40 mL/hr, IV ; Ordering Provider:   NICHOLAUS SUZEN ASKEW; Catalog Code:   Lactated Ringers  Injection ; Order Dt/Tm:   03/04/2021 10:18:36 EDT ; Comment:   Perioperative use ONLY  For Non Dialysis Patient          Sodium Chloride  0.9% intravenous solution 1000 mL  :   Sodium Chloride  0.9% intravenous solution 1000 mL ; Status:   Ordered ; Ordered As Mnemonic:   Sodium Chloride  0.9% 1000 mL ; Simple Display Line:   40 mL/hr, IV ; Ordering Provider:   NICHOLAUS SUZEN ASKEW; Catalog Code:  Sodium Chloride  0.9% ; Order Dt/Tm:   03/04/2021 10:18:36 EDT ; Comment:   Perioperative use ONLY          polyethylene glycol 3350  Oral Powder for Recon  :   polyethylene glycol 3350  Oral Powder for Recon ; Status:   Ordered ; Ordered As Mnemonic:   MiraLax  ; Simple Display Line:   17 g, 1 packets, Oral, Daily ; Ordering Provider:   DONOVAN,  JAYNE M-MD; Catalog Code:   polyethylene glycol 3350  ; Order Dt/Tm:   04/04/2021 16:54:40 EDT          docusate-senna 50 mg-8.6 mg Tab  :   docusate-senna 50 mg-8.6 mg Tab ; Status:   Ordered ; Ordered As Mnemonic:   docusate-senna 50 mg-8.6 mg oral tablet ; Simple Display Line:   1 tabs, Oral, BID ; Ordering Provider:   DONOVAN,  JAYNE M-MD; Catalog Code:   docusate-senna ; Order Dt/Tm:   04/04/2021 16:54:40 EDT ; Comment:   hold for loose stools          acetaminophen  500 mg Tab  :   acetaminophen  500 mg Tab ; Status:   Ordered ; Ordered As Mnemonic:   acetaminophen  ; Simple Display Line:   500 mg, 1 tabs, Oral, q6hr, PRN: mild pain (1-3) ; Ordering Provider:   DONOVAN,  JAYNE M-MD; Catalog Code:   acetaminophen  ; Order Dt/Tm:   04/04/2021 16:54:40 EDT ; Comment:   Scheduled for multimodal pain control.  Do not exceed 4000mg  APAP in 24 hours.           acetaminophen -oxyCODONE  325 mg-5 mg Tab  :   acetaminophen -oxyCODONE  325 mg-5 mg Tab ; Status:   Ordered ; Ordered As Mnemonic:   oxyCODONE -acetaminophen  5 mg-325 mg oral tablet range dose ; Simple Display Line:   2 tabs, Oral, q4hr, PRN: severe pain (8-10) ; Ordering Provider:   DONOVAN,  JAYNE M-MD; Catalog Code:   acetaminophen -oxyCODONE  ; Order Dt/Tm:   04/04/2021 16:54:40 EDT ; Comment:   MAX DAILY DOSE OF ACETAMINOPHEN  = 4000 MG          tiZANidine  4 mg Tab  :   tiZANidine  4 mg Tab ; Status:   Ordered ; Ordered As Mnemonic:   tiZANidine  ; Simple Display Line:   4 mg, 1 tabs, Oral, q6hr-INT ; Ordering Provider:   DONOVAN,  JAYNE M-MD; Catalog Code:   tiZANidine  ; Order Dt/Tm:   04/04/2021 16:54:40 EDT          bisacodyl  10 mg Rectal Supp  :   bisacodyl  10 mg Rectal Supp ; Status:   Ordered ; Ordered As Mnemonic:   bisacodyl  ; Simple Display Line:   10 mg, 1 supp, PR, Daily, PRN: constipation ; Ordering Provider:   DONOVAN,  JAYNE M-MD; Catalog Code:   bisacodyl  ; Order Dt/Tm:   04/04/2021 16:52:20 EDT ; Comment:   Use Glycerine suppository first if ordered          A Patient Specific Medication  :   A Patient Specific Medication ; Status:   Ordered ; Ordered As Mnemonic:   A Patient Specific Medication ; Simple Display Line:   1 EA, Kit-Combo, q5min, PRN: other (see comment) ; Ordering Provider:   DONOVAN,  JAYNE M-MD; Catalog Code:   A Patient Specific Medication ; Order Dt/Tm:   04/04/2021 16:12:36 EDT          A Patient Specific Refrigerated Medication  :   A Patient Specific Refrigerated Medication ;  Status:   Ordered ; Ordered As Mnemonic:   A Patient Specific Refrigerated Medication ; Simple Display Line:   1 EA, Kit-Combo, q37min, PRN: other (see comment) ; Ordering Provider:   DONOVAN,  JAYNE M-MD; Catalog Code:   A Patient Specific Refrigerated Medicati ; Order Dt/Tm:   04/04/2021 16:12:36 EDT ; Comment:   to access the patient specific Refrigerated medications          Delivery and Return [place] Access   :   Delivery and Return [place] Access ; Status:   Ordered ; Ordered As Mnemonic:   Delivery and Return [place] Access ; Simple Display Line:   1 EA, Kit-Combo, q45min, PRN: other (see comment) ; Ordering Provider:   DONOVAN,  JAYNE M-MD; Catalog Code:   Delivery and Return Bin Access ; Order Dt/Tm:   04/04/2021 16:12:36 EDT ; Comment:   This code grants access to the [employer] for the Delivery and Return Bin Access          lidocaine  1% PF Inj Soln 2 mL  :   lidocaine  1% PF Inj Soln 2 mL ; Status:   Ordered ; Ordered As Mnemonic:   lidocaine  1% preservative-free injectable solution ; Simple Display Line:   0.25 mL, ID, q5min, PRN: other (see comment) ; Ordering Provider:   DONOVAN,  JAYNE M-MD; Catalog Code:   lidocaine  ; Order Dt/Tm:   04/04/2021 16:12:36 EDT ; Comment:   to access lidocaine  1%  2 mL vial for IV start and Life Port access          lidocaine  2% Topical Gel with applicator 10-11 mL  :   lidocaine  2% Topical Gel with applicator 10-11 mL ; Status:   Ordered ; Ordered As Mnemonic:   Uro-Jet 2% topical gel with applicator ; Simple Display Line:   1 app, Topical, q5min, PRN: other (see comment) ; Ordering Provider:   DONOVAN,  JAYNE M-MD; Catalog Code:   lidocaine  topical ; Order Dt/Tm:   04/04/2021 16:12:36 EDT          Respiratory MDI Treatment  :   Respiratory MDI Treatment ; Status:   Ordered ; Ordered As Mnemonic:   Respiratory MDI Treatment ; Simple Display Line:   1 EA, Kit-Combo, q5min, PRN: other (see comment) ; Ordering Provider:   DONOVAN,  JAYNE M-MD; Catalog Code:   Respiratory MDI Treatment ; Order Dt/Tm:   04/04/2021 16:12:36 EDT          sodium chloride  0.9% Inj Soln 10 mL syringe  :   sodium chloride  0.9% Inj Soln 10 mL syringe ; Status:   Ordered ; Ordered As Mnemonic:   10 mL sodium chloride  0.9% flush syringe range dose ; Simple Display Line:   30 mL, IV Push, q5min, PRN: other (see comment) ; Ordering Provider:   DONOVAN,  JAYNE M-MD; Catalog Code:   sodium chloride  flush ;  Order Dt/Tm:   04/04/2021 16:12:36 EDT          sodium chloride  flush  :   sodium chloride  flush ; Status:   Ordered ; Ordered As Mnemonic:   3 mL sodium chloride  0.9% flush syringe ; Simple Display Line:   10 mL, IV Push, q5min, PRN: other (see comment) ; Ordering Provider:   DONOVAN,  JAYNE M-MD; Catalog Code:   sodium chloride  flush ; Order Dt/Tm:   04/04/2021 16:12:36 EDT ; Comment:   IV Flush replacement while 10 mL flushes are on backorder. NOT FOR CENTRAL  LINE,  For Central line flush, submit patient specific missing med request to pharmacy.          sodium chloride  0.9% Inj Soln 10 mL vial PF  :   sodium chloride  0.9% Inj Soln 10 mL vial PF ; Status:   Ordered ; Ordered As Mnemonic:   sodium chloride  0.9% vial for reconstitution range dose ; Simple Display Line:   30 mL, IV Push, q5min, PRN: other (see comment) ; Ordering Provider:   DONOVAN,  JAYNE M-MD; Catalog Code:   sodium chloride  flush ; Order Dt/Tm:   04/04/2021 16:12:36 EDT ; Comment:   for access to sodium chloride  0.9% vial when needed as a diluent  for reconstitutable medications          sterile water Inj Soln 10 mL  :   sterile water Inj Soln 10 mL ; Status:   Ordered ; Ordered As Mnemonic:   sterile water for reconstitution ; Simple Display Line:   10 mL, N/A, q5min, PRN: other (see comment) ; Ordering Provider:   DONOVAN,  JAYNE M-MD; Catalog Code:   sterile water for reconstitution ; Order Dt/Tm:   04/04/2021 16:12:36 EDT ; Comment:   Access sterile water when needed as a diluent  for reconstitutable medications. Not for IV use.          bisacodyl  10 mg Rectal Supp  :   bisacodyl  10 mg Rectal Supp ; Status:   Ordered ; Ordered As Mnemonic:   bisacodyl  ; Simple Display Line:   10 mg, 1 supp, PR, Daily, PRN: constipation ; Ordering Provider:   NICHOLAUS SUZEN ASKEW; Catalog Code:   bisacodyl  ; Order Dt/Tm:   04/04/2021 11:12:50 EDT          tiZANidine  4 mg Tab  :   tiZANidine  4 mg Tab ; Status:   Discontinued ; Ordered As Mnemonic:   tiZANidine   ; Simple Display Line:   4 mg, 1 tabs, Oral, q6hr-INT ; Ordering Provider:   DONOVAN,  JAYNE M-MD; Catalog Code:   tiZANidine  ; Order Dt/Tm:   04/03/2021 13:11:38 EDT          polyethylene glycol 3350  Oral Powder for Recon  :   polyethylene glycol 3350  Oral Powder for Recon ; Status:   Discontinued ; Ordered As Mnemonic:   MiraLax  ; Simple Display Line:   17 g, 1 packets, Oral, Daily ; Ordering Provider:   DONOVAN,  JAYNE M-MD; Catalog Code:   polyethylene glycol 3350  ; Order Dt/Tm:   04/01/2021 14:57:34 EDT          docusate-senna 50 mg-8.6 mg Tab  :   docusate-senna 50 mg-8.6 mg Tab ; Status:   Discontinued ; Ordered As Mnemonic:   docusate-senna 50 mg-8.6 mg oral tablet ; Simple Display Line:   1 tabs, Oral, BID ; Ordering Provider:   DONOVAN,  JAYNE M-MD; Catalog Code:   docusate-senna ; Order Dt/Tm:   04/01/2021 14:57:34 EDT ; Comment:   hold for loose stools          famotidine 20 mg Tab  :   famotidine 20 mg Tab ; Status:   Discontinued ; Ordered As Mnemonic:   famotidine ; Simple Display Line:   20 mg, 1 tabs, Oral, BID ; Ordering Provider:   NICHOLAUS SUZEN ASKEW; Catalog Code:   famotidine ; Order Dt/Tm:   04/01/2021 14:57:34 EDT          acetaminophen  500 mg Tab  :   acetaminophen  500  mg Tab ; Status:   Discontinued ; Ordered As Mnemonic:   acetaminophen  ; Simple Display Line:   500 mg, 1 tabs, Oral, q6hr ; Ordering Provider:   DONOVAN,  JAYNE M-MD; Catalog Code:   acetaminophen  ; Order Dt/Tm:   04/01/2021 14:57:34 EDT ; Comment:   Scheduled for multimodal pain control.  Do not exceed 4000mg  APAP in 24 hours.          dexAMETHasone  4 mg/mL Inj Soln 1 mL  :   dexAMETHasone  4 mg/mL Inj Soln 1 mL ; Status:   Completed ; Ordered As Mnemonic:   dexAMETHasone  ; Simple Display Line:   4 mg, 1 mL, IV Push, q6hr ; Ordering Provider:   NICHOLAUS SUZEN ASKEW; Catalog Code:   dexAMETHasone  ; Order Dt/Tm:   04/01/2021 14:57:34 EDT          acetaminophen -oxyCODONE  325 mg-5 mg Tab  :   acetaminophen -oxyCODONE  325 mg-5  mg Tab ; Status:   Discontinued ; Ordered As Mnemonic:   oxyCODONE -acetaminophen  5 mg-325 mg oral tablet range dose ; Simple Display Line:   2 tabs, Oral, q4hr, PRN: moderate pain (4-7) ; Ordering Provider:   DONOVAN,  JAYNE M-MD; Catalog Code:   acetaminophen -oxyCODONE  ; Order Dt/Tm:   04/01/2021 14:57:34 EDT ; Comment:   MAX DAILY DOSE OF ACETAMINOPHEN  = 4000 MG          aluminum hydroxide/magnesium  hydroxide/simethicone  200 mg-200 mg-20 mg/5 mL  :   aluminum hydroxide/magnesium  hydroxide/simethicone  200 mg-200 mg-20 mg/5 mL ; Status:   Ordered ; Ordered As Mnemonic:   aluminum hydroxide/magnesium  hydroxide/simethicone  200 mg-200 mg-20 mg/5 mL oral suspension ; Simple Display Line:   30 mL, Oral, q4hr, PRN: indigestion ; Ordering Provider:   NICHOLAUS SUZEN ASKEW; Catalog Code:   Al hydroxide/Mg hydroxide/simethicone  ; Order Dt/Tm:   04/01/2021 14:57:34 EDT          diphenhydrAMINE  25 mg Cap  :   diphenhydrAMINE  25 mg Cap ; Status:   Ordered ; Ordered As Mnemonic:   diphenhydrAMINE  ; Simple Display Line:   25 mg, 1 caps, Oral, q6hr, PRN: itching/allergic reaction ; Ordering Provider:   NICHOLAUS SUZEN ASKEW; Catalog Code:   diphenhydrAMINE  ; Order Dt/Tm:   04/01/2021 14:57:34 EDT          hydrALAZINE  20 mg/mL Inj Soln 1 mL  :   hydrALAZINE  20 mg/mL Inj Soln 1 mL ; Status:   Ordered ; Ordered As Mnemonic:   hydrALAZINE  ; Simple Display Line:   10 mg, 0.5 mL, IV, q6hr, PRN: hypertension ; Ordering Provider:   NICHOLAUS SUZEN ASKEW; Catalog Code:   hydrALAZINE  ; Order Dt/Tm:   04/01/2021 14:57:34 EDT          morphine  2 mg/mL preservative-free Sol  :   morphine  2 mg/mL preservative-free Sol ; Status:   Completed ; Ordered As Mnemonic:   morphine  range dose ; Simple Display Line:   6 mg, 3 mL, IV Push, q4hr, PRN: severe pain (8-10) ; Ordering Provider:   NICHOLAUS SUZEN ASKEW; Catalog Code:   morphine  ; Order Dt/Tm:   04/01/2021 14:57:34 EDT ; Comment:   RN may implement IM order if IV acces is lost.           ondansetron  4 mg Tab  :   ondansetron  4 mg Tab ; Status:   Ordered ; Ordered As Mnemonic:   ondansetron  ; Simple Display Line:   4 mg, 1 tabs, Oral, q6hr, PRN: nausea/vomiting ;  Ordering Provider:   NICHOLAUS SUZEN ASKEW; Catalog Code:   ondansetron  ; Order Dt/Tm:   04/01/2021 14:57:34 EDT ; Comment:   use ondansetron  before promethazine  if both are ordered          ondansetron  2 mg/mL Inj Soln 2 mL  :   ondansetron  2 mg/mL Inj Soln 2 mL ; Status:   Ordered ; Ordered As Mnemonic:   ondansetron  ; Simple Display Line:   4 mg, 2 mL, IV Push, q6hr, PRN: nausea/vomiting ; Ordering Provider:   NICHOLAUS SUZEN ASKEW; Catalog Code:   ondansetron  ; Order Dt/Tm:   04/01/2021 14:57:34 EDT ; Comment:   use ondansetron  before promethazine  if both are ordered          phenol 1.4% Topical Spray 177 mL  :   phenol 1.4% Topical Spray 177 mL ; Status:   Ordered ; Ordered As Mnemonic:   phenol 1.4% topical spray ; Simple Display Line:   5 sprays, Oral, q2hr, PRN: sore throat ; Ordering Provider:   NICHOLAUS SUZEN ASKEW; Catalog Code:   phenol topical ; Order Dt/Tm:   04/01/2021 14:57:34 EDT          promethazine  25 mg Rectal Supp  :   promethazine  25 mg Rectal Supp ; Status:   Ordered ; Ordered As Mnemonic:   promethazine  range dose ; Simple Display Line:   12.5 mg, 0.5 supp, PR, q6hr, PRN: nausea/vomiting ; Ordering Provider:   NICHOLAUS SUZEN ASKEW; Catalog Code:   promethazine  ; Order Dt/Tm:   04/01/2021 14:57:34 EDT ; Comment:   Use ondansetron  before promethazine  if both ordered. Lower doses of 6.25-12.5mg  are recommended for pts > 2 yo.  THIS MEDICATION IS ASSOCIATED   WITH   AN INCREASED RISK OF FALLS.          promethazine  25 mg/mL Inj Soln 1 mL  :   promethazine  25 mg/mL Inj Soln 1 mL ; Status:   Ordered ; Ordered As Mnemonic:   promethazine  range dose ; Simple Display Line:   12.5 mg, 0.5 mL, IM, q6hr, PRN: nausea/vomiting ; Ordering Provider:   NICHOLAUS SUZEN ASKEW; Catalog Code:   promethazine  ; Order Dt/Tm:    04/01/2021 14:57:34 EDT ; Comment:   Use ondansetron  before promethazine  if both ordered. Lower doses of 6.25-12.5mg  are recommended for pts > 57 yo.  WHEN ADMINISTERING IV, ALWAYS DILUTE IN 9 ML NORMAL SALINE FOR A TOTAL VOLUME OF - [FINAL CONCENTRATION OF 2.5 MG/ML] AND PUSH OVER 1 MINUTE **DO NOT DILUTE FOR IM ADMINISTRATION** Use oral then rectal route first, if ordered.  THIS MEDICATION IS ASSOCIATED WITH AN INCREASED RISK OF FALLS          promethazine  25 mg Tab  :   promethazine  25 mg Tab ; Status:   Ordered ; Ordered As Mnemonic:   promethazine  range dose ; Simple Display Line:   12.5 mg, 0.5 tabs, Oral, q6hr, PRN: nausea/vomiting ; Ordering Provider:   NICHOLAUS SUZEN ASKEW; Catalog Code:   promethazine  ; Order Dt/Tm:   04/01/2021 14:57:34 EDT ; Comment:   Use ondansetron  before promethazine  if both ordered. Lower doses of 6.25-12.5mg  are recommended for pts > 13 yo.  THIS MEDICATION IS ASSOCIATED   WITH   AN INCREASED RISK OF FALLS.          simethicone  125 mg Chew Tab  :   simethicone  125 mg Chew Tab ; Status:   Ordered ; Ordered As Mnemonic:  simethicone  ; Simple Display Line:   125 mg, 1 tabs, Oral, QID, PRN: gas ; Ordering Provider:   NICHOLAUS SUZEN ASKEW; Catalog Code:   simethicone  ; Order Dt/Tm:   04/01/2021 14:57:34 EDT ; Comment:   max dose 500 mg in 24 hours          lidocaine  1% PF Inj Soln 2 mL  :   lidocaine  1% PF Inj Soln 2 mL ; Status:   Ordered ; Ordered As Mnemonic:   lidocaine  1% preservative-free injectable solution ; Simple Display Line:   0.25 mL, ID, q5min, PRN: other (see comment) ; Ordering Provider:   RODOLFO NEST L-FNP; Catalog Code:   lidocaine  ; Order Dt/Tm:   04/01/2021 07:20:17 EDT ; Comment:   to access lidocaine  1%  2 mL vial for IV start and Life Port access          sodium chloride  0.9% Inj Soln 10 mL syringe  :   sodium chloride  0.9% Inj Soln 10 mL syringe ; Status:   Ordered ; Ordered As Mnemonic:   10 mL sodium chloride  0.9% flush syringe range dose ;  Simple Display Line:   30 mL, IV Push, q5min, PRN: other (see comment) ; Ordering Provider:   RODOLFO NEST L-FNP; Catalog Code:   sodium chloride  flush ; Order Dt/Tm:   04/01/2021 07:20:17 EDT            Home Meds    tiZANidine   :   tiZANidine  ; Status:   Documented ; Ordered As Mnemonic:   tiZANidine  4 mg oral tablet ; Simple Display Line:   4 mg, 1 tabs, Oral, q6hr-INT, 0 Refill(s) ; Ordering Provider:   NICHOLAUS SUZEN ASKEW; Catalog Code:   tiZANidine  ; Order Dt/Tm:   04/04/2021 13:23:09 EDT          acetaminophen   :   acetaminophen  ; Status:   Documented ; Ordered As Mnemonic:   acetaminophen  500 mg oral tablet ; Simple Display Line:   500 mg, 1 tabs, Oral, q6hr, 0 Refill(s) ; Ordering Provider:   NICHOLAUS SUZEN ASKEW; Catalog Code:   acetaminophen  ; Order Dt/Tm:   04/04/2021 13:22:07 EDT          docusate  :   docusate ; Status:   Discontinued ; Ordered As Mnemonic:   Dulcolax Stool Softener 100 mg oral capsule ; Simple Display Line:   100 mg, 1 caps, Oral, BID, PRN: for constipation, 20 caps, 0 Refill(s) ; Catalog Code:   docusate ; Order Dt/Tm:   03/21/2021 10:53:57 EDT          diazepam   :   diazepam  ; Status:   Discontinued ; Ordered As Mnemonic:   diazepam  10 mg oral tablet ; Simple Display Line:   10 mg, 1 tabs, Oral, TID, PRN: for anxiety, 0 Refill(s) ; Catalog Code:   diazepam  ; Order Dt/Tm:   03/19/2021 11:28:27 EDT          cyclobenzaprine  :   cyclobenzaprine ; Status:   Discontinued ; Ordered As Mnemonic:   Flexeril ; Simple Display Line:   Oral, TID, 0 Refill(s) ; Catalog Code:   cyclobenzaprine ; Order Dt/Tm:   07/03/2020 13:36:37 EST          acetaminophen -oxyCODONE   :   acetaminophen -oxyCODONE  ; Status:   Documented ; Ordered As Mnemonic:   Percocet  5/325 oral tablet ; Simple Display Line:   2 tabs, Oral, q4hr, PRN: severe pain (8-10), 0  Refill(s) ; Ordering Provider:   DONOVAN,  JAYNE M-MD; Catalog Code:   acetaminophen -oxyCODONE  ; Order Dt/Tm:   09/06/2019 13:56:57 EDT            Problem  History   (As Of: 04/04/2021 17:03:47 EDT)   Problems(Active)    Acute myelopathy (SNOMED CT  :8784240981 )  Name of Problem:   Acute myelopathy ; Recorder:   LEWEY,  JENNIFER L-FNP; Confirmation:   Confirmed ; Classification:   Medical ; Code:   8784240981 ; Contributor System:   PowerChart ; Last Updated:   08/24/2019 9:04 EDT ; Life Cycle Status:   Active ; Responsible Provider:   RODOLFO NEST L-FNP; Vocabulary:   SNOMED CT        Back pain (SNOMED CT  :747688984 )  Name of Problem:   Back pain ; Recorder:   PYLE, RN, JOYCE; Confirmation:   Confirmed ; Classification:   Patient Stated ; Code:   747688984 ; Contributor System:   PowerChart ; Last Updated:   06/28/2019 11:25 EST ; Life Cycle Date:   06/28/2019 ; Life Cycle Status:   Active ; Vocabulary:   SNOMED CT        History of DVT of lower extremity (SNOMED CT  :7013061988 )  Name of Problem:   History of DVT of lower extremity ; Recorder:   RODOLFO NEST L-FNP; Confirmation:   Confirmed ; Classification:   Medical ; Code:   7013061988 ; Contributor System:   PowerChart ; Last Updated:   03/21/2021 11:03 EDT ; Life Cycle Date:   03/21/2021 ; Life Cycle Status:   Active ; Vocabulary:   SNOMED CT        OSA (obstructive sleep apnea) (SNOMED CT  :870110984 )  Name of Problem:   OSA (obstructive sleep apnea) ; Recorder:   PYLE, RN, JOYCE; Confirmation:   Confirmed ; Classification:   Patient Stated ; Code:   [id number] ; Contributor System:   PowerChart ; Last Updated:   06/28/2019 11:25 EST ; Life Cycle Date:   06/28/2019 ; Life Cycle Status:   Active ; Vocabulary:   SNOMED CT        Sciatica (SNOMED CT  :61272986 )  Name of Problem:   Sciatica ; Recorder:   PYLE, RN, JOYCE; Confirmation:   Confirmed ; Classification:   Patient Stated ; Code:   [id number] ; Contributor System:   PowerChart ; Last Updated:   06/28/2019 11:25 EST ; Life Cycle Date:   06/28/2019 ; Life Cycle Status:   Active ; Vocabulary:   SNOMED CT          Diagnoses(Active)    Cervical myelopathy   Date:   04/04/2021 ; Diagnosis Type:   Discharge ; Confirmation:   Confirmed ; Clinical Dx:   Cervical myelopathy ; Classification:   Medical ; Clinical Service:   Non-Specified ; Code:   ICD-10-CM ; Probability:   0 ; Diagnosis Code:   G95.9        Procedure History        -    Procedure History   (As Of: 04/04/2021 17:03:47 EDT)     Anesthesia Minutes:   0 ; Procedure Name:   Hernia ; Procedure Minutes:   0            Anesthesia Minutes:   0 ; Procedure Name:   Tonsillectomy ; Procedure Minutes:   0            Procedure  Dt/Tm:   06/29/2019 11:32:00 EST ; Location:   [facility] ; Provider:   GUERRY RONALEE ORN; Anesthesia Type:   Local ; Anesthesia Minutes:   0 ; Procedure Name:   Transforaminal Epidural Ster Inj ; Procedure Minutes:   2 ; Comments:     06/29/2019 11:34 EST - SHERRILEE, RN, [name] A  auto-populated from documented surgical case ; Clinical Service:   Surgery            Procedure Dt/Tm:   1995 ; Anesthesia Minutes:   0 ; Procedure Name:   thumb surgery ; Procedure Minutes:   0            Procedure Dt/Tm:   04/22/2011 ; Anesthesia Minutes:   0 ; Procedure Name:   Lumbar fusion L4-5 ; Procedure Minutes:   0            Procedure Dt/Tm:   8027 ; Anesthesia Minutes:   0 ; Procedure Name:   tonsils and adenoids ; Procedure Minutes:   0            Procedure Dt/Tm:   2012 ; Anesthesia Minutes:   0 ; Procedure Name:   redo lumbar fusion L5-S1 ; Procedure Minutes:   0            Anesthesia Minutes:   0 ; Procedure Name:   colonoscopy ; Procedure Minutes:   0            Procedure Dt/Tm:   8018 ; Anesthesia Minutes:   0 ; Procedure Name:   facial surgery ; Procedure Minutes:   0            Procedure Dt/Tm:   2005 ; Anesthesia Minutes:   0 ; Procedure Name:   L5-S1 lumbar fusion ; Procedure Minutes:   0            Procedure Dt/Tm:   08/26/2019 08:40:00 EDT ; Location:   [facility] ; Provider:   VIKI,  CURTIS-MD; Anesthesia Type:   General ; :   DEBBY ADE L-MD; Anesthesia Minutes:   0 ; Procedure  Name:   Anterior Cervical Discectomy with Fusion and/or Stabilization SCIP ; Procedure Minutes:   166 ; Comments:     08/26/2019 11:38 EDT - [place], RN, Sharlet PARAS  auto-populated from documented surgical case ; Clinical Service:   Surgery            Procedure Dt/Tm:   05/31/2020 11:05:00 EST ; Location:   [facility] ; Provider:   SHEENA RONALEE W-MD; Anesthesia Type:   Local ; Anesthesia Minutes:   0 ; Procedure Name:   Transforaminal Epidural Ster Inj ; Procedure Minutes:   6 ; Comments:     05/31/2020 11:13 EST - PYLE, RN, JOYCE  auto-populated from documented surgical case ; Clinical Service:   Surgery            Procedure Dt/Tm:   05/31/2020 11:05:00 EST ; Location:   [facility] ; Provider:   SHEENA RONALEE W-MD; Anesthesia Type:   Local ; Anesthesia Minutes:   0 ; Procedure Name:   Si Joint Injection ; Procedure Minutes:   6 ; Comments:     05/31/2020 11:13 EST - PYLE, RN, JOYCE  auto-populated from documented surgical case ; Clinical Service:   Surgery            Procedure Dt/Tm:   07/03/2020 13:53:00 EST ; Location:  [facility] ; Provider:   SHEENA HOMER W-MD; Anesthesia Type:   Local ; Anesthesia Minutes:   0 ; Procedure Name:   Lumbar Epidural Steroid Injection ; Procedure Minutes:   6 ; Comments:     07/03/2020 13:59 EST - Dasie, RN, Forbes A  auto-populated from documented surgical case ; Clinical Service:   Surgery            Procedure Dt/Tm:   07/03/2020 13:53:00 EST ; Location:   [facility] ; Provider:   SHEENA HOMER W-MD; Anesthesia Type:   Local ; Anesthesia Minutes:   0 ; Procedure Name:   Si Joint Injection (Bilateral) ; Procedure Minutes:   6 ; Comments:     07/03/2020 13:59 EST - Dasie, RN, Forbes A  auto-populated from documented surgical case ; Clinical Service:   Surgery            Procedure Dt/Tm:   04/01/2021 09:21:00 EDT ; Location:   [facility] ; Provider:   VIKI SINK C-MD; Anesthesia Type:   General ; :   REETA SALOMON KICK;  Anesthesia Minutes:   0 ; Procedure Name:   Posterior Cervical Laminectomy/Discectomy/Decompression SCIP ; Procedure Minutes:   194 ; Comments:     04/01/2021 12:48 EDT - Di, RN, Damien HERO  auto-populated from documented surgical case ; Clinical Service:   Surgery            ID Risk Screen Symptoms   Recent Travel History :   No recent travel   Last 90 days COVID-19 ID :   No   Close Contact with COVID-19 ID :   No   Last 14 days COVID-19 ID :   No   Alix Sally M-RN - 04/04/2021 16:58 EDT   Bloodless Medicine   Is Blood Transfusion Acceptable to Patient :   Yes   Alix Sally M-RN - 04/04/2021 16:58 EDT   Nutrition   MST Does Your Current Diet Include :   None   Nutrition Screen for Malnutrition :   Patient denies   Alix Sally M-RN - 04/04/2021 16:58 EDT   Functional   Sensory Deficits :   None   ADLs Prior to Admission :   Independent   Alix Sally M-RN - 04/04/2021 16:58 EDT   Social History   Social History   (As Of: 04/04/2021 17:03:47 EDT)   Tobacco:        Tobacco use: Never (less than 100 in lifetime).   (Last Updated: 08/25/2019 09:36:30 EDT by RODOLFO NEST L-FNP)          Electronic Cigarette/Vaping:        Never Electronic Cigarette Use.   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 :   No   Alix Sally M-RN - 04/04/2021 16:58 EDT   Advance Directive   Location of Advance Directive :   Family to bring in copy from home    Advance Directive :   Yes   Type of Advance Directive :   Living will, Medical durable power of attorney   Patient Wishes to Receive Further Information on Advance Directives :   No   Alix Sally M-RN - 04/04/2021 16:58 EDT   Education   Written Language :   Isadora   Primary Language :   Isadora Alix Sally M-RN - 04/04/2021 16:58 EDT   Caregiver/Advocate Language   Patient :   None   Family :   None   Alix Sally M-RN - 04/04/2021 16:58 EDT   Barriers to Learning :   None evident   Teaching Method :   Demonstration, Explanation   Responsible Learner Present for Session :   Chaney Alix Sally M-RN - 04/04/2021 16:58 EDT   Preventative Measures Information   Unit/Room Orientation :   [place] understanding   Environmental Safety :   [place] understanding   Hand Washing :   Verbalizes understanding   Infection Prevention :   Verbalizes understanding   DVT Prophylaxis :   Verbalizes understanding   Isolation Precaution :   Verbalizes understanding   Alix Sally M-RN - 04/04/2021 16:58 EDT   DC Needs   Home Caregiver Name/Relationship :   self  Wife   Alix Sally M-RN - 04/04/2021 16:58 EDT   Valuables and Belongings   Does Patient Have Valuables and Belongings :   Yes   Alix Sally M-RN - 04/04/2021 16:58 EDT   Valuables and Belongings   At Bedside :   Clothes, Glasses   Alix Sally M-RN - 04/04/2021 16:58 EDT   Admission Complete   Admission Complete :   Yes   Alix Sally M-RN - 04/04/2021 16:58 EDT

## 2021-04-04 NOTE — Progress Notes (Signed)
 Inpatient PT Daily Documentation - Text       Inpatient PT Daily Documentation Entered On:  04/04/2021 14:02 EDT    Performed On:  04/04/2021 13:55 EDT by KENNYTH DOMINO D-PTA               Reason for Treatment   *Reason for Referral :   s/p C5-6 PCDF    fall risk, cervical precautions; PMH- chronic pain     PTA Visit Acute :   pta #2 rn and pt agree to PT. I sat along time on the commode last night, It about killed me.   KENNYTH DOMINO D-PTA - 04/04/2021 13:55 EDT   Pain Assessment   Pain Present :   Yes actual or suspected pain   Pain Present :   Neck   Laterality :   Right   Self Report Pain :   Numeric rating scale   KENNYTH DOMINO D-PTA - 04/04/2021 13:55 EDT   Mobility   Mobility Grid   Supine to Sit :   1, Rehab Minimal assistance   Transfer Sit to Stand :   1, Rehab Minimal assistance   Transfer Stand to Sit :   1, Rehab Minimal assistance   KENNYTH DOMINO D-PTA - 04/04/2021 13:55 EDT   Ambulation Level Acute :   Minimal assistance   Ambulation Quality :   Short stride, wide BOS, uneven cadence   Ambulation Distance :   3 ft   Ambulation Distance 2 Acute :   20 ft   Device :   Gait belt, Rolling walker   KENNYTH DOMINO D-PTA - 04/04/2021 13:55 EDT   Assessment   PT Impairments or Limitations :   Ambulation deficits, Balance deficits, Bed mobility deficits, Pain limiting function, Strength deficits, Transfer deficits   Discharge Recommendations :   eval: acute rehab     D/CTransportation Recommendations :   Stretcher/Requires skilled assist for transfer   D/C Transportation Recommendations Reviewed :   Yes   PT Treatment Recommendations :   Pt c soft cervical collar.  brief review LE therex.  HOB elevated than pt supine-sit min A 1.  pt able to tolerated upright posture EOB pain #7.  Sit-stand, leaning on walker pt able toside-step EOB.  sat rested , amb c r.walker to door and back to chair min a 1.  Positioned to comfort, nsring staff searchinf for seat alarm and aware pt in chair.     Pt tolerance to  EOB and gait act's much improved today.  appropriate for acute rehab to return to prior level of independence.  Will benefit from multidiscipline approach.     KENNYTH DOMINO D-PTA - 04/04/2021 13:55 EDT   Time Spent With Patient   PT Time In :   9:05 EST   PT Time Out :   9:25 EST   PT Functional Training Time :   20 minutes   PTA Functional Training Units :   1 units   PT Total Timed Code Treatment Units :   1 units   PT Total Timed Code Min :   20    PT Total Treatment Time Acute/OP :   12    PARKER,  SARAH D-PTA - 04/04/2021 13:55 EDT

## 2021-04-04 NOTE — Progress Notes (Signed)
Rehab Medicine Progress Note               patient will admit to room 880 today per Dr. Domenic Schwab. Appreciate the referral.    Signature Line     Electronically Signed on 04/04/2021 01:43 PM EDT   ________________________________________________   Caryn Bee

## 2021-04-05 LAB — CBC WITH AUTO DIFFERENTIAL
Basophils %: 0.5 % (ref 0.0–2.0)
Basophils Absolute: 0.1 10*3/uL (ref 0.0–0.2)
Eosinophils %: 1.9 % (ref 0.0–7.0)
Eosinophils Absolute: 0.2 10*3/uL (ref 0.0–0.5)
Hematocrit: 44 % (ref 38.0–52.0)
Hemoglobin: 14.7 g/dL (ref 13.0–17.3)
Immature Grans (Abs): 0.06 10*3/uL (ref 0.00–0.06)
Immature Granulocytes %: 0.5 % (ref 0.0–0.6)
Lymphocytes Absolute: 2.6 10*3/uL (ref 1.0–3.2)
Lymphocytes: 22.8 % (ref 15.0–45.0)
MCH: 29.8 pg (ref 27.0–34.5)
MCHC: 33.4 g/dL (ref 32.0–36.0)
MCV: 89.2 fL (ref 84.0–100.0)
MPV: 9.2 fL (ref 7.2–13.2)
Monocytes %: 12.9 % — ABNORMAL HIGH (ref 4.0–12.0)
Monocytes Absolute: 1.5 10*3/uL — ABNORMAL HIGH (ref 0.3–1.0)
NRBC Absolute: 0 10*3/uL (ref 0.000–0.012)
NRBC Automated: 0 % (ref 0.0–0.2)
Neutrophils %: 61.4 % (ref 42.0–74.0)
Neutrophils Absolute: 7.1 10*3/uL (ref 1.6–7.3)
Platelets: 313 10*3/uL (ref 140–440)
RBC: 4.93 x10e6/mcL (ref 4.00–5.60)
RDW: 12.3 % (ref 11.0–16.0)
WBC: 11.6 10*3/uL — ABNORMAL HIGH (ref 3.8–10.6)

## 2021-04-05 LAB — COMPREHENSIVE METABOLIC PANEL
ALT: 13 U/L (ref 0–50)
AST: 14 U/L (ref 0–50)
Albumin/Globulin Ratio: 1.2 (ref 1.00–2.70)
Albumin: 3.7 g/dL (ref 3.5–5.2)
Alk Phosphatase: 63 U/L (ref 40–130)
Anion Gap: 7 mmol/L (ref 2–17)
BUN: 9 mg/dL (ref 8–23)
CALCIUM,CORRECTED,CCA: 9.4 mg/dL (ref 8.8–10.2)
CO2: 30 mmol/L — ABNORMAL HIGH (ref 22–29)
Calcium: 9.2 mg/dL (ref 8.8–10.2)
Chloride: 98 mmol/L (ref 98–107)
Creatinine: 0.7 mg/dL (ref 0.7–1.3)
Est, Glom Filt Rate: 105 mL/min/1.73m?? (ref >=90)
Globulin: 3 g/dL (ref 1.9–4.4)
Glucose: 85 mg/dL (ref 70–99)
Osmolaliy Calculated: 268 mosm/kg — ABNORMAL LOW (ref 270–287)
Potassium: 4.2 mmol/L (ref 3.5–5.3)
Sodium: 135 mmol/L (ref 135–145)
Total Bilirubin: 0.28 mg/dL (ref 0.00–1.20)
Total Protein: 6.7 g/dL (ref 6.4–8.3)

## 2021-04-05 NOTE — Progress Notes (Signed)
 OT Inpatient Evaluation - Text       OT Inpatient Evaluation Entered On:  04/05/2021 12:01 EDT    Performed On:  04/05/2021 11:55 EDT by Chandra Chol               Reason for Treatment   Subjective Statement :   Pt agreeable to OT treatment today.     Chandra Chol - 04/05/2021 11:55 EDT   *Reason for Referral :   Dx: 60 yo s/p C5-6 bilateral laminectomy for decompression of spinal cord per Dr. 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  Precautions: Cervical spine prec, soft c-collar PRN for pain  PMHx: Prior ACDF, lumbar fusions  Activity: 15 hr  TC date: Thursday - Patti Chandra,  Mikal-OT - 04/05/2021 12:46 EDT     Home Environment   Living Environment :   Home Environment  Anticipated Need for Home Modification:  No  Performed By:  Kathreen Nixon M-PT 04/05/2021  Detail Areas of Responsibilities:  spouse - gail , retired  Performed By:  Kathreen Nixon M-PT 04/05/2021  Kitchen:  1st floor  Performed By:  Kathreen Nixon M-PT 04/05/2021  Laundry:  1st floor  Performed By:  Kathreen Nixon M-PT 04/05/2021  Lives In:  Single level home  Performed By:  Kathreen Nixon M-PT 04/05/2021  Lives With:  Spouse  Performed By:  Kathreen Nixon M-PT 04/05/2021  Number of Stairs Inside:  0  Performed By:  Kathreen Nixon M-PT 04/05/2021  Number of Stairs Outside:  3  Performed By:  Kathreen Nixon M-PT 04/05/2021  Outside Stairs Rail:  Bilateral  Performed By:  Kathreen Nixon M-PT 04/05/2021  Patient's Responsibilities:  Yard work, Copywriter, advertising ADL, Shopping, Social participation  Performed By:  Kathreen Nixon M-PT 04/05/2021  Primary Bathroom:  1st floor  Performed By:  Kathreen Nixon M-PT 04/05/2021  Primary Bedroom:  1st floor  Performed By:  Kathreen Nixon M-PT 04/05/2021  Sensory Deficits:  None  Performed By:  Alix Sally M-RN 04/04/2021     Lives With :   Spouse   Lives In :   Single level home   [place],  [place] - 04/05/2021 14:29 EDT   Rehabilitation Stairs Grid     Inside Stairs  Outside Stairs         Number of Stairs :    0    3            Rail :       Forrestine Chandra Chol - 04/05/2021 14:29 EDT  Chandra Chol - 04/05/2021 14:29 EDT        Home Setup Grid   Primary Bedroom :   1st floor   Primary Bathroom :   1st floor   Kitchen :   1st floor   Laundry :   1st floor   Chandra Chol - 04/05/2021 14:29 EDT   Patient's Responsibilities Rehab :   Yard work, Copywriter, advertising ADL, Film/video editor, Social participation   Detail Areas of Responsibilities :   spouse - gail , retired   Chandra Chol - 04/05/2021 14:29 EDT   Home Environment II   Living Environment :   Home Environment  Sensory Deficits:  None  Performed By:  Alix Sally M-RN 04/04/2021  Anticipated Need for Home Modification:  No  Performed By:  Logan Rocky BRAVO 04/04/2021  Kitchen:  1st floor  Performed By:  Logan Rocky BRAVO 04/04/2021  Laundry:  1st floor  Performed By:  Logan Rocky E 04/04/2021  Lives In:  Single level home  Performed By:  Logan Rocky BRAVO 04/04/2021  Number of Stairs Inside:  0  Performed By:  Logan Rocky BRAVO 04/04/2021  Number of Stairs Outside:  3  Performed By:  Logan Rocky BRAVO 04/04/2021  Outside Stairs Rail:  Bilateral  Performed By:  Logan Rocky BRAVO 04/04/2021  Patient's Responsibilities:  Yard work, Copywriter, advertising ADL, Film/video editor, Social participation  Performed By:  Logan Rocky E 04/04/2021  Primary Bathroom:  1st floor  Performed By:  Logan Rocky BRAVO 04/04/2021  Primary Bedroom:  1st floor  Performed By:  Logan Rocky BRAVO 04/04/2021     [place],  Mikal-OT - 04/05/2021 14:29 EDT   OT Basic ADL   OT Basic ADL 2019   Eating :   Setup or clean-up assistance - Helper sets up or cleans up; Patient/Resident completes activity. Helper assists only prior to or following the activity. - 05   Oral Hygiene :   Setup or clean-up assistance - Helper sets up or cleans up; Patient/Resident completes activity. Helper assists only prior to or following the activity. - 05   Toileting Hygiene :   Partial/Moderate  assistance - Helper does LESS THAN HALF the effort. Helper lifts, holds or supports trunk or limbs, but provides less than half the effort. - 03   Toilet Transfer :   Partial/Moderate assistance - Helper does LESS THAN HALF the effort. Helper lifts, holds or supports trunk or limbs, but provides less than half the effort. - 03   Shower, Bathe Self :   Partial/Moderate assistance - Helper does LESS THAN HALF the effort. Helper lifts, holds or supports trunk or limbs, but provides less than half the effort. - 03   Upper Body Dressing :   Supervision or touching assistance - Helper provides verbal cues and/or touching/steadying and/or contact guard assistance as patient/resident completes activity. Assistance may be provided throughout the activity or intermittently. - 04   Lower Body Dressing :   Supervision or touching assistance - Helper provides verbal cues and/or touching/steadying and/or contact guard assistance as patient/resident completes activity. Assistance may be provided throughout the activity or intermittently. - 04   Putting On, Taking Off Footwear :   Setup or clean-up assistance - Helper sets up or cleans up; Patient/Resident completes activity. Helper assists only prior to or following the activity. - 05   Grooming :   Setup or clean-up assistance - Helper sets up or cleans up; Patient/Resident completes activity. Helper assists only prior to or following the activity. - 05   Tub Transfer :   Not attempted due to medical condition or safety concerns - 2   Shower Transfer :   Not attempted due to medical condition or safety concerns - 88   Chandra,  Mikal-OT - 04/05/2021 11:55 EDT   ADL Comments :   ADL eval 89/71  All scores reflect post intervention    Eating: setup supine c HOB elevated  Oral hygiene: setup supine c HOB elevated  Toilet hygiene: not observed, anticipate min A rolling  Toilet t/f: not observed, anticipate min A rolling on/off bedpan  Bathe: supine c HOB elevated, min A for back  UB  dressing: touch A to manage around head,  seated EOB  LB dressing: setup rolling L<>R supine  Grooming: setup supine c HOB elevated     OT ADL Reviewed :   Chaney Slain,  Mikal-OT - 04/05/2021 11:55 EDT   Sitting Balance Assessment   Sitting Surface Evaluated Upon :   Bed   Slain Chol - 04/05/2021 14:29 EDT   Static Sitting Balance Assessment Grid   Sits Without UE Support :   Close supervision   Sits With One UE Support :   Close supervision   Sits With Two UE Support :   Close supervision   Slain Chol - 04/05/2021 14:29 EDT   UE ROM/Strength   Upper Extremity Overall ROM Grid   Left Upper Extremity Active Range :   Within functional limits   Right Upper Extremity Active Range :   Within functional limits   Slain Chol - 04/05/2021 14:00 EDT   UE Coordination   Mean Age Gender Lt 9 Hole Peg Test :   21.60   Right Mean for Age/Gender :   20.87   Slain Chol - 04/05/2021 14:29 EDT   Left Upper Extremity Coordination Grid   Diadochokinesia :   Within functional limits   Finger to Nose :   Within functional limits   Finger Opposition :   Within functional limits   Slain Chol - 04/05/2021 14:29 EDT   Right Upper Extremty Coordination Grid   Diadochokinesia :   Within functional limits   Finger to Nose :   Within functional limits   Finger Opposition :   Within functional limits   Slain Chol - 04/05/2021 14:29 EDT   UE Sensation   Left Sensation Grid   Light Touch :   Impaired   Slain Chol - 04/05/2021 14:29 EDT   Right Sensation Grid   Sharie Touch :   Impaired   Slain Chol - 04/05/2021 14:29 EDT   UE Function   Previous Hand Dominance :   Right   Current Hand Dominance :   Right   Left Grip Strength Trial 1 :   60 lb   Left Grip Strength Trial 2 :   60 lb   Left Grip Strength Trial 3 :   60 lb   Left Hand Grip Strength :   60 lb   Mean for Age,Gender Left Hand Grip :   76.8 lbs   Right Grip Strength Trial 1 :   60 lb   Right Grip Strength Trial 2 :   60 lb   Right Grip  Strength Trial 3 :   60 lb   Right Hand Grip Strength :   60 lb   Mean for Age,Gender Right Hand Grip :   89.7 lbs   Slain Chol - 04/05/2021 14:00 EDT   DME OT   Additional Comments OT DME Rehab Summary :   Grossly min-max A for ADL's depending on pain    Barriers: R sided neck pain, cervical/spinal precautions    ELOS: 2 weeks    LTG: Independent, sup for showers    DME: ongoing assessment             Slain Chol - 04/05/2021 12:46 EDT   Assessment   OT Impairments or Limitations :   Balance deficits, Basic activity of daily living deficits, Coordination deficits, Endurance deficits, Equipment training, IADL deficits, Mobility deficits, Pain, Range of motion deficits, Safety awareness deficits, Sensory deficits, Strength deficits  Barriers to Safe Discharge OT :   Medical diagnosis, Past medical history   OT Discharge Recommendations :   Home with wife, HHOT     OT Treatment Recommendations :   Pt is a 60yo man presenting to inpatient rehab following C5-6 bilateral laminectomy. Pt demonstrates elevated pain and mobility deficits that limit independence with self care ADL's upon OT eval. Pt would benefit from skilled OT services in order to treat deficits and maximize functional independence.     Chandra Chol - 04/05/2021 12:46 EDT   Long Term Goals   OT Patient/Caregiver Goal :   Pt would like to restore PLOF   Chandra Chol - 04/05/2021 12:21 EDT   OT IP Long Term Goals Grid     Long Term Goal 1          Goal :    Pt will be able to stand with no UE support in order to participate in IADL's                Chandra Chol - 04/05/2021 12:21 EDT         OT Long Term Goals 2019   Eating Goal :   Independent - Patient/Resident completes the activities by him/herself, with or without an assistive device, with no assistance from a helper. - 06   Oral Hygiene Goal :   Independent - Patient/Resident completes the activities by him/herself, with or without an assistive device, with no assistance  from a helper. - 06   Toileting Hygiene Goal :   Independent - Patient/Resident completes the activities by him/herself, with or without an assistive device, with no assistance from a helper. - 06   Shower, Bathe Self Goal :   Supervision or touching assistance - Helper provides verbal cues and/or touching/steadying and/or contact guard assistance as patient/resident completes activity. Assistance may be provided throughout the activity or intermittently. - 04   Upper Body Dressing Goal :   Independent - Patient/Resident completes the activities by him/herself, with or without an assistive device, with no assistance from a helper. - 06   Lower Body Dressing Goal :   Independent - Patient/Resident completes the activities by him/herself, with or without an assistive device, with no assistance from a helper. - 06   Put On,Taking Off Footwear Goal :   Independent - Patient/Resident completes the activities by him/herself, with or without an assistive device, with no assistance from a helper. - 06   Toilet Transfer Goal :   Independent - Patient/Resident completes the activities by him/herself, with or without an assistive device, with no assistance from a helper. - 06   Chandra,  Mikal-OT - 04/05/2021 12:21 EDT   OT LT Goals Reviewed :   Chaney Chandra,  Mikal-OT - 04/05/2021 12:21 EDT   Short Term Goals   Bathing Goal Grid     Goal #1          Activity :    Shower transfer              Assist :    Minimal assistance              Equipment :    Tub bench              Status :    Initial                Chandra Chol - 04/05/2021 12:21 EDT  Toileting and Transfers Goal Grid     Goal #1          Activity :    Toilet transfers              Assist :    Minimal assistance              Transfer Equipment :    Drop arm commode              Status :    Initial                Chandra Chol - 04/05/2021 12:21 EDT         OT Balance Goal Grid     Goal #1          Type :    Static standing              Descriptors :    Two  upper extremities support              Assist :    Contact guard assistance              Length of Time (minutes) :    2 minutes              Rationale :    Improve independence with activities of daily living              Status :    Initial                Chandra Chol - 04/05/2021 12:21 EDT         OT ST Goals Reviewed :   Chaney Chandra,  Mikal-OT - 04/05/2021 12:21 EDT   Plan   OT Therapy Days Per Week :   7   [place],  Mikal-OT - 04/05/2021 12:21 EDT   OT Therapy Hours Per Day :   1.5   OT Therapy Duration :   2 Weeks   Notes Tasked :   Daily   Notes Duration :   2    Notes Duration Units :   Weeks   Planned Treatments :   Balance training, Basic Activities of Daily Living, Caregiver training, Coordination, Copywriter, advertising, Energy conservation training, Equipment training, Group therapy, HEP, Home assessment/modification, Home management, Home program, Joint protection, Manual therapy, Mobility training, Neuromuscular reeducation, Pain management, Patient education, Safety education, Soft Tissue Mobilization, Therapeutic activities, Therapeutic exercises, Therapeutic exercises for strengthening and ROM, Thermal modalities   Treatment Plan/Goals Established With Patient/Caregiver :   Chaney Chandra Chol - 04/05/2021 12:01 EDT   Time Spent With Patient   OT Time In 2 :   14:00 EST   OT Time Out 2 :   14:30 EST   OT Self Care ADL Training Units :   5 units   OT Self Care ADL Training Time :   75 minutes   OT Total Timed Code Treatment Unit Rehab :   5    OT Total Timed Code Treatment Minutes Rehab :   75    Chandra Chol - 04/05/2021 14:29 EDT   OT Time In :   8:30 EST   OT Time Out :   9:30 EST   OT Evaluation Units, Low Complexity :   1 Unit   OT Individual Eval Time, Low Complexity :   15 minutes   Chandra Chol - 04/05/2021 11:55 EDT  OT Total Individual Therapy Time Rehab :   58    Chandra Chol - 04/05/2021 14:29 EDT     OT Total Untimed Code Treatment Min Rehab :   15    Chandra Chol -  04/05/2021 11:55 EDT   OT Total Treatment Time Rehab :   90 minutes   Chandra Chol - 04/05/2021 14:29 EDT     Image 1 -  Images currently included in the form version of this document have not been included in the text rendition version of the form.   Safety   Safety Time Rehab 2 :   14:30 EST   Environmental Safety Implemented Rehab 2 :   Bed in low position, Bed alarm, Wheels locked, Call device within reach, Personal items within reach   Patient Position Rehab 2 :   Supine in bed   [place],  Mikal-OT - 04/05/2021 14:29 EDT   Safety Time Rehab 1 :   9:30 EST   Environmental Safety Implemented Rehab 1 :   Bed in low position, Bed alarm, Wheels locked, Call device within reach, Personal items within reach   Patient Position Rehab 1 :   Supine in bed   [place],  Mikal-OT - 04/05/2021 11:55 EDT   Section C: Cognitive Patterns   Patient Makes Self Understood, Verbally or in Writing :   Understood   Words Patient Repeated :   Sock, Blue, Bed   What Year Is It Right Now :   2022   Accuracy of Year Response :   Correct   What Month Is It Right Now :   October   Accuracy of Month Response :   Accurate within 5 days   What Day of the Week Is It :   Friday   Accuracy of Day of Week Response :   Correct   BIMS Able to Recall Sock :   Yes, no cue required   BIMS Able to Recall Blue :   Yes, no cue required   BIMS Able to Recall Bed :   Yes, no cue required   BIMS Summary Score :   15    [id number] Staff Assess Mental Status Needed :   No - Patient was ABLE to complete Brief Interview for Mental Status   Chandra Chol - 04/05/2021 14:00 EDT   Section C: Signs and Symptoms of Delirium   Acute Onset Mental Change :   No   Inattention :   Behavior not present   Disorganized Thinking :   Behavior not present   Altered Level Of Consciousness :   Behavior not present   Chandra Chol - 04/05/2021 12:46 EDT   Section GG: Self Care Abilities   Section GG: Self Care Abilities 2019   *Eating :   Independent - Patient/Resident  completes the activities by him/herself, with or without an assistive device, with no assistance from a helper. - 06   *Oral Hygiene :   Substantial/Maximal assistance - Helper does MORE THAN HALF the effort. Helper lifts or holds trunk or limbs and provides more than half the effort. - 02   *Toileting Hygiene :   Substantial/Maximal assistance - Helper does MORE THAN HALF the effort. Helper lifts or holds trunk or limbs and provides more than half the effort. - 02   *Shower, Bathe Self :   Substantial/Maximal assistance - Helper does MORE THAN HALF the effort. Helper lifts or holds trunk or limbs and provides more  than half the effort. - 02   *Upper Body Dressing :   Substantial/Maximal assistance - Helper does MORE THAN HALF the effort. Helper lifts or holds trunk or limbs and provides more than half the effort. - 02   *Lower Body Dressing :   Substantial/Maximal assistance - Helper does MORE THAN HALF the effort. Helper lifts or holds trunk or limbs and provides more than half the effort. - 02   *Putting On, Taking Off Footwear :   Substantial/Maximal assistance - Helper does MORE THAN HALF the effort. Helper lifts or holds trunk or limbs and provides more than half the effort. - 02   *Toilet Transfer :   Substantial/Maximal assistance - Helper does MORE THAN HALF the effort. Helper lifts or holds trunk or limbs and provides more than half the effort. - 02   Chandra Chol - 04/05/2021 12:21 EDT

## 2021-04-05 NOTE — Progress Notes (Signed)
PT Inpatient Examination - Text       PT Inpatient Evaluation Entered On:  04/05/2021 8:44 EDT    Performed On:  04/05/2021 8:41 EDT by Arbie Cookey M-PT               Reason for Treatment   *Reason for Referral :   Dx: 60 yo s/p C5-6 bilateral laminectomy for decompression of spinal cord per Dr. 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  Precautions: Cervical spine prec, soft c-collar PRN for pain, PAIN LIMITING FNX  PMHx: Prior ACDF,  Activity: 15 hr  TC date: Thursday - Titus Mould M-PT - 04/05/2021 12:23 EDT   Home Environment   Living Environment :   Home Environment  Sensory Deficits:  None  Performed By:  Leandra Kern M-RN 04/04/2021  Anticipated Need for Home Modification:  No  Performed By:  Caryn Bee 04/04/2021  Kitchen:  1st floor  Performed By:  Caryn Bee 04/04/2021  Laundry:  1st floor  Performed By:  Ellin Saba E 04/04/2021  Lives In:  Single level home  Performed By:  Caryn Bee 04/04/2021  Number of Stairs Inside:  0  Performed By:  Caryn Bee 04/04/2021  Number of Stairs Outside:  3  Performed By:  Caryn Bee 04/04/2021  Outside Stairs Rail:  Bilateral  Performed By:  Caryn Bee 04/04/2021  Patient's Responsibilities:  Yard work, Copywriter, advertising ADL, Film/video editor, Social participation  Performed By:  Ellin Saba E 04/04/2021  Primary Bathroom:  1st floor  Performed By:  Caryn Bee 04/04/2021  Primary Bedroom:  1st floor  Performed By:  Caryn Bee 04/04/2021     Lives With :   Spouse   Lives In :   Single level home   Anticipated   Need For Home   Modification :   No   Arbie Cookey M-PT - 04/05/2021 10:51 EDT   Stairs     Inside Stairs  Outside Stairs        Number of Stairs :    0    3            Rail :       Bilateral             Arbie Cookey M-PT - 04/05/2021 10:51 EDT  Arbie Cookey M-PT - 04/05/2021 10:51 EDT        Home Setup   Primary Bedroom :   1st floor   Primary Bathroom :   1st  floor   Kitchen :   1st floor   Laundry :   1st floor   Arbie Cookey M-PT - 04/05/2021 10:51 EDT   Patient's Responsibilities :   Yard work, Copywriter, advertising ADL, Film/video editor, Social participation   Detail Areas of Responsibilities :   spouse - gail , retired   Arbie Cookey M-PT - 04/05/2021 10:51 EDT   Home Environment II   Living Environment :   Home Environment  Sensory Deficits:  None  Performed By:  Leandra Kern M-RN 04/04/2021  Anticipated Need for Home Modification:  No  Performed By:  Caryn Bee 04/04/2021  Kitchen:  1st floor  Performed By:  Caryn Bee 04/04/2021  Laundry:  1st floor  Performed By:  Caryn Bee 04/04/2021  Lives In:  Single level home  Performed By:  Caryn Bee 04/04/2021  Number of Stairs Inside:  0  Performed By:  Caryn Bee 04/04/2021  Number of Stairs Outside:  3  Performed By:  Caryn Bee 04/04/2021  Outside Stairs Rail:  Bilateral  Performed By:  Caryn Bee 04/04/2021  Patient's Responsibilities:  Yard work, Copywriter, advertising ADL, Shopping, Social participation  Performed By:  Ellin Saba E 04/04/2021  Primary Bathroom:  1st floor  Performed By:  Caryn Bee 04/04/2021  Primary Bedroom:  1st floor  Performed By:  Caryn Bee 04/04/2021     Arbie Cookey M-PT - 04/05/2021 10:51 EDT   LE ROM/Strength   Lt Lower Extremity Strength :   Other: limited by pain   Arbie Cookey M-PT - 04/05/2021 10:51 EDT   Left Lower Extremity Strength Grid   Hip Flexion :   4   Hip Abduction :   4+   Hip Adduction :   4+   Knee Flexion :   4   Knee Extension :   4   Ankle Dorsiflexion :   4+   Arbie Cookey M-PT - 04/05/2021 10:51 EDT   Rt Lower Extremity Strength :   Other: limited by pain   Arbie Cookey M-PT - 04/05/2021 10:51 EDT   Right Lower Extremity Strength Grid   Hip Flexion :   4   Hip Abduction :   4+   Hip Adduction :   4+   Knee Flexion :   4   Knee Extension :   4   Ankle Dorsiflexion :   4+   Arbie Cookey M-PT - 04/05/2021 10:51 EDT   Sensation    Impact of Impaired LE Sensation :   pt reports premorbid N/T in R LE , improving since surgery   Arbie Cookey M-PT - 04/05/2021 10:51 EDT   LE Coordination   Trace Square Left Lower Extremity   Toe Taps :   Within functional limits   Arbie Cookey M-PT - 04/05/2021 12:23 EDT   Right Lower Extremity Coordination   Toe Taps :   Within functional limits   Arbie Cookey M-PT - 04/05/2021 12:23 EDT   Sitting Balance   Static Sitting Balance Assessment Grid   Sits Without UE Support :   Close supervision   Sits With One UE Support :   Close supervision   Sits With Two UE Support :   Close supervision   Arbie Cookey M-PT - 04/05/2021 11:04 EDT   Sitting Surface Evaluated Upon :   Bed   Patient Accepts Perturbations :   No   Arbie Cookey M-PT - 04/05/2021 11:04 EDT   Standing Balance   Static Standing Balance   Stands Without UE Support :   Rehab Total assistance   Stands with One UE Support :   Rehab Maximal assistance   Stands with Two UE Support :   Rehab Moderate assistance   Arbie Cookey M-PT - 04/05/2021 12:23 EDT   Standing Surface Evaluated Upon :   Floor   Patient Accepts Pertubations :   No   Arbie Cookey M-PT - 04/05/2021 12:23 EDT   DCP GENERIC CODE   Anterior Shift Standing Balance :   Unable   Posterior Shift :   Unable   Lateral to the Left Shift :  Unable   Lateral to the Right Shift :   Leanne Chang M-PT - 04/05/2021 12:23 EDT   PT Mobility   PT Mobility 2019   Roll Left :   Supervision or touching assistance - Helper provides verbal cues and/or touching/steadying and/or contact guard assistance as patient/resident completes activity. Assistance may be provided throughout the activity or intermittently. - 04   Roll Right :   Supervision or touching assistance - Helper provides verbal cues and/or touching/steadying and/or contact guard assistance as patient/resident completes activity. Assistance may be provided throughout the activity or intermittently. - 04   Roll Left and Right :   Supervision  or touching assistance - Helper provides verbal cues and/or touching/steadying and/or contact guard assistance as patient/resident completes activity. Assistance may be provided throughout the activity or intermittently. - 04   Roll Supine :   Supervision or touching assistance - Helper provides verbal cues and/or touching/steadying and/or contact guard assistance as patient/resident completes activity. Assistance may be provided throughout the activity or intermittently. - 04   Lying to Sitting on Side of Bed :   Partial/Moderate assistance - Helper does LESS THAN HALF the effort. Helper lifts, holds or supports trunk or limbs, but provides less than half the effort. - 03   Sit to Stand :   Substantial/Maximal assistance - Helper does MORE THAN HALF the effort. Helper lifts or holds trunk or limbs and provides more than half the effort. - 02   Stand to Sit :   Substantial/Maximal assistance - Helper does MORE THAN HALF the effort. Helper lifts or holds trunk or limbs and provides more than half the effort. - 02   Chair, Bed to Chair Transfer :   Substantial/Maximal assistance - Helper does MORE THAN HALF the effort. Helper lifts or holds trunk or limbs and provides more than half the effort. - 02   Car Transfer :   Not attempted due to medical condition or safety concerns - 64   Arbie Cookey M-PT - 04/05/2021 12:23 EDT   Independent - Patient/Resident completes the activities by him/herself, with or without an assistive device, with no assistance from a helper. - 06 :   fnxl mob grid above and below reflect post-PT intervention c use of bed fnxs and BRs as pt reporting signif pain.    pt unable to perf any amb despite inc attempts/time other than stand step trfs from [employer] M-PT - 04/05/2021 12:23 EDT   PT Ambulation 2019   Walk 10 Feet :   Not attempted due to medical condition or safety concerns - 88   Walk 50 Feet with Two Turns :   Not attempted due to medical condition or safety concerns - 88    Walk 150 Feet :   Not attempted due to medical condition or safety concerns - 88   Walking 10 Feet Uneven Surfaces :   Not attempted due to medical condition or safety concerns - 88   1 Step (Curb) :   Not attempted due to medical condition or safety concerns - 88   Ramp Ambulation :   Not attempted due to medical condition or safety concerns - 88   4 Steps :   Not attempted due to medical condition or safety concerns - 88   12 Steps :   Not attempted due to medical condition or safety concerns - 88   Picking Up Object :   Not attempted  due to medical condition or safety concerns - 3   Arbie Cookey M-PT - 04/05/2021 12:23 EDT   Transfer Type :   Stand step   Ambulation Device Utilized :   Rolling walker   Distance Level Surface :   3 ft   PT Mobility Reviewed :   Yes   Arbie Cookey M-PT - 04/05/2021 12:23 EDT   WC Management   Wheelchair Details :   pt req inc time and demoing small arc propulsion technique for propelling MWC 150' c BUEs despite cues for propulsion technique and efficiency     Arbie Cookey M-PT - 04/05/2021 12:23 EDT   Wheelchair Mobility   Wheel 50 Feet with Two Turns :   Supervision or touching assistance - Helper provides verbal cues and/or touching/steadying and/or contact guard assistance as patient/resident completes activity. Assistance may be provided throughout the activity or intermittently. - 04   Wheel 150 Feet :   Supervision or touching assistance - Helper provides verbal cues and/or touching/steadying and/or contact guard assistance as patient/resident completes activity. Assistance may be provided throughout the activity or intermittently. - 04   Arbie Cookey M-PT - 04/05/2021 12:23 EDT   Wheelchair Mobility Level Distance Daily :   150    PT Wheelchair Mobilitiy Reviewed Rehab :   Kyra Leyland M-PT - 04/05/2021 12:23 EDT   Assessment   PT Impairments or Limitations :   Abnormal tone, Ambulation deficits, Balance deficits, Bed mobility deficits, Endurance deficits, Equipment  training, Pain limiting function, Strength deficits, Transfer deficits, Transition deficits, Wheelchair mobility deficits   Barriers to Safe Discharge PT :   Medical diagnosis, Past medical history, Severity of deficits   Discharge Recommendations :   d/c home c spouse    OPPT vs HH    DME: ?          PT Treatment Recommendations :   pt admitted to [facility] s/p C5-6 bilateral laminectomy for decompression of spinal cord per Dr. 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, and presents c the above impairments and limitations includ pain limiting fnx, impacting pts overall LOF. pt unable to perf any amb besides stand step trf from bed>wc, d/t singif pain in C spine/shoulder/back. nsg notified of pain and pt received pain meds during trx. pt would benefit from skilled IPPT c trx emphasis on above deficits to inc overall LOF and dec burden of care upon d/c. pt approp to cont to perf stand step trfs c RW c PT/OT and PT recc use of UM for support staff for inc safety.         Arbie Cookey M-PT - 04/05/2021 12:23 EDT   Short Term Goals   Bed Mobility Goal Grid     Goal #1          Descriptors :    Sit to supine, Supine to sit              Level :    Close supervision              Status :    Initial                Arbie Cookey M-PT - 04/05/2021 12:23 EDT         Transfers Goal Grid     Goal #1  Goal #2        Descriptors :    Sit to  stand, Stand to sit   Stand step           Level :    Moderate assistance   Moderate assistance           Device :    Rolling walker   Rolling walker           Status :    Initial   Initial             Arbie Cookey M-PT - 04/05/2021 12:23 EDT  Arbie Cookey M-PT - 04/05/2021 12:23 EDT        Ambulation Goal Grid     Goal #1          Descriptors :    Level surfaces              Level :    Moderate assistance              Distance/# of Steps :    15              Device :    Rolling walker              Status :    Initial                Arbie Cookey M-PT - 04/05/2021 12:23 EDT          PT Balance Goal Grid     Goal #1          Descriptor :    Supported stand              Assist Level :    Close supervision              Type :    Static standing              Devices :    Rolling walker              Length of Time (minutes) :    3 minutes              Rationale :    Improve independence with activities of daily living              Status :    Initial                Arbie Cookey M-PT - 04/05/2021 12:23 EDT         PT ST Goals Reviewed :   Yes   Arbie Cookey M-PT - 04/05/2021 12:23 EDT   PT Long Term Goals   Outpatient PT Long Term Goals Rehab     Long Term Goal 1          Goal :    pt will complete TUG              Status :    Initial                Arbie Cookey M-PT - 04/05/2021 12:23 EDT         Mobility Goal 2019   Roll Left and Right Goal :   Independent - Patient/Resident completes the activities by him/herself, with or without an assistive device, with no assistance from a helper. - 06   Sit to Lying Goal :   Independent - Patient/Resident completes the activities by him/herself, with or without an assistive device, with no assistance from a helper. -  06   Lying to Sitting Side of Bed Goal :   Independent - Patient/Resident completes the activities by him/herself, with or without an assistive device, with no assistance from a helper. - 06   Sit to Stand Goal :   Independent - Patient/Resident completes the activities by him/herself, with or without an assistive device, with no assistance from a helper. - 06   Chair,Bed to Chair Transfer Goal :   Independent - Patient/Resident completes the activities by him/herself, with or without an assistive device, with no assistance from a helper. - 06   Car Transfer Goal :   Independent - Patient/Resident completes the activities by him/herself, with or without an assistive device, with no assistance from a helper. - 06   Arbie Cookey M-PT - 04/05/2021 12:23 EDT   Walk Goals 2019   Walk 10 Feet Goal :   Independent - Patient/Resident completes the  activities by him/herself, with or without an assistive device, with no assistance from a helper. - 06   Walk 50 Feet with Two Turns Goal :   Independent - Patient/Resident completes the activities by him/herself, with or without an assistive device, with no assistance from a helper. - 06   Walk 150 Feet Goal :   Independent - Patient/Resident completes the activities by him/herself, with or without an assistive device, with no assistance from a helper. - 06   Arbie Cookey M-PT - 04/05/2021 12:23 EDT   Wheelchair Goals 2019   Wheel 50 Feet with Two Turns Goal :   Independent - Patient/Resident completes the activities by him/herself, with or without an assistive device, with no assistance from a helper. - 06   Wheel 150 feet Goal :   Independent - Patient/Resident completes the activities by him/herself, with or without an assistive device, with no assistance from a helper. - 06   Arbie Cookey M-PT - 04/05/2021 12:23 EDT   PT LTG Reconcilation :   stair LTGs pending further assessment/progression   Type of Wheelchair Goal :   Manual wheelchair   PT LT Goals Reviewed :   Yes   Arbie Cookey M-PT - 04/05/2021 12:23 EDT   Plan   PT Evaluation Date :   04/05/2021 10:30 EDT   PT Therapy Days Per Week :   7   PT Therapy Hours Per Day :   1.5   PT Therapy Duration :   2 Weeks   Notes Tasked :   Daily   Notes Duration Units :   2    PT Duration Unit Rehab :   Weeks   Treatments Planned :   Balance training, Basic activities of daily living, Bed mobility training, Caregiver training, Community/Work reintegration, Equipment training, Functional training, Gait training, Group therapy, Manual therapy, Moist heat/ice, Neuromuscular reeducation, Orthotic training, Pain management, Posture/Body mechanics training, Patient education, Self Care Management, Soft tissue massage/MFR, Stair training, Therapeutic activities, Therapeutic exercises, Transfer training, Wheelchair assessment and management   Treatment Plan/Goals Established  With Patient/Caregiver :   Yes   Evaluation Complete :   Yes   Arbie Cookey M-PT - 04/05/2021 12:23 EDT   Time Spent With Patient   PT Time In :   10:30 EST   PT Time Out :   12:00 EST   PT Individual Eval Time, Moderate Complexity :   15 minutes   PT Evaluation Units, Moderate Complexity :   1 Unit   PT Self Care ADL Training Units :  5 units   Arbie Cookey M-PT - 04/05/2021 12:23 EDT   PT Self Care ADL Training Time :   75 minutes     PT Total Individual Time Rehab :   90    Arbie Cookey M-PT - 04/10/2021 17:51 EDT     PT Total Timed Code Treatment Unit Rehab :   5    Arbie Cookey M-PT - 04/05/2021 12:23 EDT   PT Total Timed Code Treatment Minutes Rehab :   75    Arbie Cookey M-PT - 04/10/2021 17:51 EDT     PT Total Untimed Code Treatmt Min Rehab :   15    Arbie Cookey M-PT - 04/05/2021 12:23 EDT   PT Total Treatment Time Rehab :   90 minutes   Arbie Cookey M-PT - 04/10/2021 17:51 EDT     Safety   Safety Time Rehab 1 :   12:00 EST   Environmental Safety Implemented Rehab 1 :   Call device within reach, Personal items within reach, Seatbelt with alarm   Patient Position Rehab 1 :   Sitting in wheelchair   Arbie Cookey M-PT - 04/05/2021 12:23 EDT   Section GG: Mobility Abilities   Section GG: Admission Mobility 2019   *Roll Left and Right :   Dependent - Helper does ALL of the effort. Patient/Resident does none of the effort to complete the activity or the assistance of 2 or more helpers is required for the patient/resident to complete the activity. - 01   *Sit to Lying :   Dependent - Helper does ALL of the effort. Patient/Resident does none of the effort to complete the activity or the assistance of 2 or more helpers is required for the patient/resident to complete the activity. - 01   *Lying to Sitting on Side of Bed :   Dependent - Helper does ALL of the effort. Patient/Resident does none of the effort to complete the activity or the assistance of 2 or more helpers is required for the patient/resident to complete  the activity. - 01   *Sit to Stand :   Dependent - Helper does ALL of the effort. Patient/Resident does none of the effort to complete the activity or the assistance of 2 or more helpers is required for the patient/resident to complete the activity. - 01   *Chair,Bed to Chair Transfer :   Dependent - Helper does ALL of the effort. Patient/Resident does none of the effort to complete the activity or the assistance of 2 or more helpers is required for the patient/resident to complete the activity. - 01   *Car Transfer :   Not attempted due to medical condition or safety concerns - 23   Arbie Cookey M-PT - 04/05/2021 12:23 EDT   [id number] Patient Walk :   Kyra Leyland M-PT - 04/05/2021 12:23 EDT   Section GG: Admission Ambulation 2019   *Walk 10 Feet :   Not attempted due to medical condition or safety concerns - 88   *Walk 50 Feet with Two Turns :   Not attempted due to medical condition or safety concerns - 88   *Walk 150 Feet :   Not attempted due to medical condition or safety concerns - 88   *Walk 10 Feet Uneven Surfaces :   Not attempted due to medical condition or safety concerns - 88   *1 Step (Curb) :   Not attempted due to medical condition  or safety concerns - 84   *4 Steps :   Not attempted due to medical condition or safety concerns - 88   *12 Steps :   Not attempted due to medical condition or safety concerns - 88   *Picking Up Object :   Not attempted due to medical condition or safety concerns - 38   Arbie Cookey M-PT - 04/05/2021 12:23 EDT   [id number] Patient Use Wheelchair,Scooter :   Yes   [id number] Type Wheelchair,Scooter Use 35ft :   Manual wheelchair   Wheelchair,Scooter Use 173ft :   Manual wheelchair   Arbie Cookey M-PT - 04/05/2021 12:23 EDT   Section GG: Wheelchair Mobility 2019   *Wheel 50 Feet with Two Turns :   Supervision or touching assistance - Helper provides verbal cues and/or touching/steadying and/or contact guard assistance as patient/resident completes activity. Assistance may be  provided throughout the activity or intermittently. - 04   *Wheel 150 feet :   Supervision or touching assistance - Helper provides verbal cues and/or touching/steadying and/or contact guard assistance as patient/resident completes activity. Assistance may be provided throughout the activity or intermittently. - 04   Arbie Cookey M-PT - 04/05/2021 12:23 EDT

## 2021-04-05 NOTE — Wound Image (Signed)
Wound Care Nurse Note               WOC RN Progress Note  Neck Posterior - Skin Abnormality Type: Surgical incision  Neck Posterior - Incision, Wound Dressing: Foam, Hydrofiber  Neck Posterior - Incision, Wound Dressing Assessment: Clean, Dry, Intact  Neck Posterior - Wound Edge: Approximated with sutures  Neck Posterior - Incision, Wound Activity: Assessed  Neck Posterior - Incision, Wound Length: 8 cm  Neck Posterior - Wound Exudate Amount: None  Neck Posterior - Incision,Wound Surrounding Tissue Color: Normal  Neck Posterior - Incision, Wound Surrounding Tissue: Dry, Intact    WOC consult for neck incision.    Incision is intact with staples and healing well.  No drainage or signs of complications.    Aquacel Ag and foam dressing ordered.  WOC to monitor PRN as incision is intact.    Orders entered into [employer].  Report given to Dr. Domenic Schwab.         [employer]

## 2021-04-05 NOTE — Progress Notes (Signed)
Progress Note-Nurse               Data:   Patient refused testing for vascular scan. Dr. Domenic Schwab notified.    Action:       Response:         Games developer Signed on 04/05/2021 03:41 PM EDT   ________________________________________________   [facility] Plan

## 2021-04-05 NOTE — Case Communication (Signed)
CM Discharge Planning Assessment - Text       CM Discharge Planning Ongoing Assessment Entered On:  04/05/2021 16:21 EDT    Performed On:  04/05/2021 16:18 EDT by Jesse Savage               Discharge Needs I   Previously Documented Discharge Needs :   DISCHARGE PLAN/NEEDS:  EQUIPMENT/TREATMENT NEEDS:       Previously Documented Benefits Information :   Performed By: Jesse Savage  - 04/04/21 08:16:00       Discharge To :   Family support   CM Progress Note :   04/05/2021: MEDICAL RECORDS REVIEWED. PATIENT ADMITTED FROM [facility]. PATIENT LIVES WITH HIS WIFE Jesse Savage [[phone]] IN A 1 LEVEL HOME WITH 3 STEPS TO ENTER. PATIENT KNOWN TO [facility] FROM PREVIOUS ADMISSION. PATIENT WENT TO OUTPATIENT [facility] AT [facility] AFTER DISCHARGE FROM [facility]. MD WILL DO IPOC. 1st TC WILL BE THURSDAY 11/3. MSW WILL FOLLOW FOR D/C PLANNING AND CASE MANAGEMENT.     Jesse Savage - 04/05/2021 16:18 EDT   Discharge Needs II   Discharge Planning Time Spent :   30 minutes   Jesse Savage - 04/05/2021 16:18 EDT

## 2021-04-05 NOTE — Progress Notes (Signed)
OT Time Spent With Patient - Text       OT Time Spent With Patient Entered On:  04/05/2021 15:05 EDT    Performed On:  04/05/2021 15:05 EDT by Marisa Sprinkles, OT, MIRA E               Time Spent With Patient   OT Apply Short Arm (SA) Dyn Unilat Min :   0 minutes   OT Total Individual Therapy Time Rehab :   0    OT Total Untimed Code Treatment Min Rehab :   0    OT Total Treatment Time Rehab :   0 minutes   KRAFT, OT, MIRA E - 04/05/2021 15:05 EDT   OT Units Cancelled Missed     OT Units Lost #1          Amount :    2               Reason :    Other: Pt in 10/10 pain trying to rest. Pt with prior [facility] stay.                 KRAFT, OT, MIRA E - 04/05/2021 15:05 EDT         Image 1 -  Images currently included in the form version of this document have not been included in the text rendition version of the form.

## 2021-04-06 LAB — CULTURE, MRSA, SURVEILLANCE

## 2021-04-06 LAB — BASIC METABOLIC PANEL
Anion Gap: 11 mmol/L (ref 2–17)
BUN: 15 mg/dL (ref 8–23)
CO2: 24 mmol/L (ref 22–29)
Calcium: 9.2 mg/dL (ref 8.8–10.2)
Chloride: 98 mmol/L (ref 98–107)
Creatinine: 0.5 mg/dL — ABNORMAL LOW (ref 0.7–1.3)
Est, Glom Filt Rate: 116 mL/min/1.73m?? (ref >=90)
Glucose: 136 mg/dL — ABNORMAL HIGH (ref 70–99)
Osmolaliy Calculated: 269 mosm/kg — ABNORMAL LOW (ref 270–287)
Potassium: 4.5 mmol/L (ref 3.5–5.3)
Sodium: 133 mmol/L — ABNORMAL LOW (ref 135–145)

## 2021-04-06 LAB — CBC
Hematocrit: 41.8 % (ref 38.0–52.0)
Hemoglobin: 14.2 g/dL (ref 13.0–17.3)
MCH: 30.2 pg (ref 27.0–34.5)
MCHC: 34 g/dL (ref 32.0–36.0)
MCV: 88.9 fL (ref 84.0–100.0)
MPV: 9.1 fL (ref 7.2–13.2)
NRBC Absolute: 0 10*3/uL (ref 0.000–0.012)
NRBC Automated: 0 % (ref 0.0–0.2)
Platelets: 294 10*3/uL (ref 140–440)
RBC: 4.7 x10e6/mcL (ref 4.00–5.60)
RDW: 12 % (ref 11.0–16.0)
WBC: 11.3 10*3/uL — ABNORMAL HIGH (ref 3.8–10.6)

## 2021-04-06 NOTE — Progress Notes (Signed)
OT Time Spent With Patient - Text       OT Time Spent With Patient Entered On:  04/06/2021 15:25 EDT    Performed On:  04/06/2021 15:23 EDT by Wilfrid Lund B-OT               Time Spent With Patient   OT Time In :   13:00 EST   OT Time Out :   13:30 EST   OT FUNCTIONAL TRNG 15 MIN :   2 units   OT Functional Activities Minutes :   30 minutes   OT Total Individual Therapy Time Rehab :   30    OT Treatment Time Comment :   Pt completed sup>sit t/f x1 with mod A. Pt reports pain at 10/10 in position and assisted back to sidelying. EDU pt on use of c collar during mobility and positioning. Biofreze applied to R shoulder. Nrsg notified of pain level.   OT Total Timed Code Treatment Unit Rehab :   2    OT Total Timed Code Treatment Minutes Rehab :   30    OT Total Treatment Time Rehab :   30 minutes   Wilfrid Lund B-OT - 04/06/2021 15:23 EDT   Image 1 -  Images currently included in the form version of this document have not been included in the text rendition version of the form.

## 2021-04-06 NOTE — Progress Notes (Signed)
Progress Note-Nurse               Data:   Patient diaphoretic with nausea.    Action:   VS taken, applied comfort measures for diaphoresis. No c/o pain at this time.     Response:   Responded to providing basic physical comfort ie cooling towels, repositioning.   Patient had soft collar on prior to episode and believes that it may have caused him to overheat. Collar removed.  Resting quietly without c/o discomfort approx. 45 minutes after onset of episode.       Signature Line     Electronically Signed on 04/06/2021 04:08 PM EDT   ________________________________________________   Shawnee Knapp L-LPN

## 2021-04-06 NOTE — Progress Notes (Signed)
PM&R Progress Note        Patient:   Jesse Savage, Jesse Savage            MRN: [id number]            FIN: [id number]               Age:   60 years     Sex:  Male     DOB:  09/02/1960   Associated Diagnoses:   None   Author:   Marianna Fuss J-DO      Subjective: Patient complains of pain in his neck and right upper shoulder/trapezius area that is not well controlled at this time.  Has difficulty finding a position of comfort.  Reports that the lidocaine patch that was added yesterday did provide some relief.  Denies fevers or chills.      Objective:   Vital Signs (last 24 hrs)_____  Last Charted___________  Temp Oral     37.0 degC  (OCT 29 05:12)  Heart Rate Peripheral   76 bpm  (OCT 29 05:12)  Resp Rate         20 br/min  (OCT 29 07:00)  SBP      H 150mHg  (OCT 29 05:12)  DBP      H 910mg  (OCT 29 05:12)  SpO2      95 %  (OCT 29 05:12)      General: Pleasant, no distress, cooperative throughout exam  HEENT: Conjunctiva clear bilaterally, oral mucosa moist  Neck: Trachea midline, dressing intact over surgical incision  CV: S1-S2  Lungs: Clear to auscultation bilaterally   Abdomen: Soft, nontender, nondistended, normoactive bowel sounds are present  Extremities: No clubbing or cyanosis, no edema  Neurologic: Alert, bright affect, follows commands consistently, motor and sensory examinations are stable     Mobility     Event Name  Event Result  Date/Time    Roll Left Rehab Supervision or touching assistance - Helper provides verbal cues and/or touching/steadying and/or contact guard assistance as patient/resident completes activity. Assistance may be provided throughout the activity or intermittently. - 04 04/05/21 08:41:00   Roll Right Rehab Supervision or touching assistance - Helper provides verbal cues and/or touching/steadying and/or contact guard assistance as patient/resident completes activity. Assistance may be provided throughout the activity or intermittently. - 04 04/05/21 08:41:00   Roll Left and Right Rehab  Supervision or touching assistance - Helper provides verbal cues and/or touching/steadying and/or contact guard assistance as patient/resident completes activity. Assistance may be provided throughout the activity or intermittently. - 04 04/05/21 08:41:00   Roll Supine Rehab Supervision or touching assistance - Helper provides verbal cues and/or touching/steadying and/or contact guard assistance as patient/resident completes activity. Assistance may be provided throughout the activity or intermittently. - 04 04/05/21 08:41:00   Chair, Bed to Chair Transfer Rehab Substantial/Maximal assistance - Helper does MORE THAN HALF the effort. Helper lifts or holds trunk or limbs and provides more than half the effort. - 02 04/05/21 08:41:00   Car Transfer Rehab Not attempted due to medical condition or safety concerns - 88 04/05/21 08:41:00   Lying to Sitting on Side of Bed Rehab Partial/Moderate assistance - Helper does LESS THAN HALF the effort. Helper lifts, holds or supports trunk or limbs, but provides less than half the effort. - 03 04/05/21 08:41:00   Sit to Stand Rehab Substantial/Maximal assistance - Helper does MORE THAN HALF the effort. Helper lifts or holds trunk or limbs and provides more than  half the effort. - 02 04/05/21 08:41:00   Stand to Sit Assist Rehab Substantial/Maximal assistance - Helper does MORE THAN HALF the effort. Helper lifts or holds trunk or limbs and provides more than half the effort. - 02 04/05/21 08:41:00     Ambulation     Event Name  Event Result  Date/Time    Walk 10 Feet Rehab Not attempted due to medical condition or safety concerns - 88 04/05/21 08:41:00   Walk 50 Feet with Two Turns Rehab Not attempted due to medical condition or safety concerns - 88 04/05/21 08:41:00   Walk 150 Feet Rehab Not attempted due to medical condition or safety concerns - 88 04/05/21 08:41:00   Walking 10 Feet Uneven Surfaces Rehab Not attempted due to medical condition or safety concerns - 88 04/05/21  08:41:00   1 Step (Curb) Rehab Not attempted due to medical condition or safety concerns - 88 04/05/21 08:41:00   Ramp Ambulation Assist Rehab Not attempted due to medical condition or safety concerns - 88 04/05/21 08:41:00   4 Steps Rehab Not attempted due to medical condition or safety concerns - 88 04/05/21 08:41:00   12 Steps Rehab Not attempted due to medical condition or safety concerns - 88 04/05/21 08:41:00   Picking Up Object Rehab Not attempted due to medical condition or safety concerns - 88 04/05/21 08:41:00     Wheelchair Mobility     Event Name  Event Result  Date/Time    Wheel 50 Feet with Two Turns Rehab Supervision or touching assistance - Helper provides verbal cues and/or touching/steadying and/or contact guard assistance as patient/resident completes activity. Assistance may be provided throughout the activity or intermittently. - 04 04/05/21 08:41:00   Wheel 150 Feet Rehab Supervision or touching assistance - Helper provides verbal cues and/or touching/steadying and/or contact guard assistance as patient/resident completes activity. Assistance may be provided throughout the activity or intermittently. - 04 04/05/21 08:41:00     Activities of Daily Living     Event Name  Event Result  Date/Time    Eating Rehab Setup or clean-up assistance - Helper sets up or cleans up; Patient/Resident completes activity. Helper assists only prior to or following the activity. - 05 04/05/21 11:55:00   Oral Hygiene Rehab Setup or clean-up assistance - Helper sets up or cleans up; Patient/Resident completes activity. Helper assists only prior to or following the activity. - 05 04/05/21 11:55:00   [place] assistance - Helper does LESS THAN HALF the effort. Helper lifts, holds or supports trunk or limbs, but provides less than half the effort. - 60 04/05/21 11:55:00   Toilet Transfer Rehab Partial/Moderate assistance - Helper does LESS THAN HALF the effort. Helper lifts, holds or  supports trunk or limbs, but provides less than half the effort. - 03 04/05/21 11:55:00   Shower, Bathe Self Rehab Partial/Moderate assistance - Helper does LESS THAN HALF the effort. Helper lifts, holds or supports trunk or limbs, but provides less than half the effort. - 03 04/05/21 11:55:00   Upper Body Dressing Rehab Supervision or touching assistance - Helper provides verbal cues and/or touching/steadying and/or contact guard assistance as patient/resident completes activity. Assistance may be provided throughout the activity or intermittently. - 04 04/05/21 11:55:00   Lower Body Dressing Rehab Supervision or touching assistance - Helper provides verbal cues and/or touching/steadying and/or contact guard assistance as patient/resident completes activity. Assistance may be provided throughout the activity or intermittently. - 04 04/05/21 11:55:00   Putting On, Taking Off  Footwear Rehab Setup or clean-up assistance - Helper sets up or cleans up; Patient/Resident completes activity. Helper assists only prior to or following the activity. - 05 04/05/21 11:55:00   Grooming Assist Rehab Setup or clean-up assistance - Helper sets up or cleans up; Patient/Resident completes activity. Helper assists only prior to or following the activity. - 05 04/05/21 11:55:00   Tub Transfer Assist Rehab Not attempted due to medical condition or safety concerns - 88 04/05/21 11:55:00   Shower Transfer Assist Rehab Not attempted due to medical condition or safety concerns - 88 04/05/21 11:55:00         Labs (Last four charted values)  WBC                  H 11.6 (OCT 28)   Hgb                  14.7 (OCT 28)   Hct                  44.0 (OCT 28)   Plt                  313 (OCT 28)   Na                   135 (OCT 28)   K                    4.2 (OCT 28)   CO2                  H 30 (OCT 28)   Cl                   98 (OCT 28)   Cr                   0.7 (OCT 28)   BUN                  9 (OCT 28)   Glucose Random       85 (OCT  28)       Medications (19) Active  Scheduled: (6)  acetaminophen 500 mg Tab  1,000 mg 2 tabs, Oral, TID  amitriptyline 25 mg Tab  25 mg 1 tabs, Oral, Once a Day (at bedtime)  docusate-senna 50 mg-8.6 mg Tab  1 tabs, Oral, BID  lidocaine 5% Topical Film  2 patches, Topical, Daily  polyethylene glycol 3350 Oral Powder for Recon  17 g 1 packets, Oral, Daily  tiZANidine 4 mg Tab  4 mg 1 tabs, Oral, q6hr-INT  Continuous: (0)  PRN: (13)  A Patient Specific Medication  1 EA, Kit-Combo, q17mn  A Patient Specific Refrigerated Medication  1 EA, Kit-Combo, q573m  bisacodyl 10 mg Rectal Supp  10 mg 1 supp, PR, Daily  Delivery and Return Bin Access  1 EA, Kit-Combo, q5m14m lactulose 10 g/15 mL Oral Syrup 30 mL  20 g 30 mL, Oral, Daily  lidocaine 1% PF Inj Soln 2 mL  0.25 mL, ID, q5mi39mlidocaine 2% Topical Gel with applicator 10-129-52 1 app, Topical, q5min42mxyCODONE 5 mg Tab  10 mg 2 tabs, Oral, q4hr  Respiratory MDI Treatment  1 EA, Kit-Combo, q5min 47mdium chloride 0.9% Inj Soln 10 mL syringe  30 mL, IV Push, q5min  41mium chloride 0.9% Inj Soln 10 mL vial PF  30 mL, IV Push, q5min  s41mum chloride flush  10 mL, IV Push, q41mn  sterile water Inj Soln 10 mL  10 mL, N/A, q543m        Impression:     1.  Cervical myelopathy  2.  Hypertension  3.  Obstructive sleep apnea  4.  History of DVT      Plan:     Continue with comprehensive multidisciplinary rehabilitation.  Pain management regimen reviewed.  Patient does not tolerate Cymbalta or gabapentin which she is tried in the past.  Percocet is helpful but does not last long enough.  We will continue with lidocaine patch but change to every morning.  Add amitriptyline 25 mg at bedtime which he states he has been on in the past and that was effective.  Schedule Tylenol 1000 mg every 8 hours and stop Percocet due to the Tylenol component.  Add oxycodone 5 to 10 mg every 4 hours as needed for moderate to severe pain.      Signature Line     Electronically Signed on 04/06/2021 11:40  AM EDT   ________________________________________________   LIMarianna Fuss-DO

## 2021-04-07 NOTE — Progress Notes (Signed)
 OT Inpatient Daily Documentation - Text       OT Inpatient Daily Documentation Entered On:  04/07/2021 15:55 EDT    Performed On:  04/07/2021 15:40 EDT by LORELLA, OT, ANN K               Reason for Treatment   Subjective Statement :   04/07/2021:  It just locks up on me and is so painful - referring to right shoulder blade area and center cervical area.        *Reason for Referral :   Dx: 60 yo s/p C5-6 bilateral laminectomy for decompression of spinal cord per Dr. 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  Precautions: Cervical spine prec, soft c-collar PRN for pain  PMHx: Prior ACDF, lumbar fusions  Activity: 15 hr  TC date: Thursday - Patti LORELLA, OT, ANN K - 04/07/2021 15:40 EDT   Review/Treatments Provided   OT Goals :   OT Short Term Goals    04/05/2021  Bathing Goal #1: Shower transfer; Minimal assistance; Tub bench; Initial  Toileting and Transfers Goal #1: Toilet transfers; Minimal assistance; Drop arm commode; Initial  Balance Goal #1: Two upper extremities support; Static standing; CGA; 2; Improve independence with activities of daily living; Initial     OT Plan :   Treatment Frequency: Daily Performed By: Chandra,  Georgena   04/05/2021  Treatment Duration: 2 Performed By: Chandra Georgena   04/05/2021  Planned Treatments: Balance training, Basic Activities of Daily Living, Caregiver training, Coordination, Electric modalities, Energy conservation training, Equipment training, Group therapy, HEP, Home assessment/modification, Home management, Home program, Joint protecti... Performed By: Chandra Georgena   04/05/2021     Short Term Goals Reviewed :   Yes   Occupational Therapy Orders :   Occupational Therapy Inpatient Additional Treatment Rehab - 04/05/21 12:23:44 EDT, Balance training, Basic Activities of Daily Living, Caregiver training, Coordination, Electric modalities, Energy conservation training, Equipment training, Group therapy, HEP, Home  assessment/modification, Home management, Home...  OT Inpatient Evaluation and Treatment Rehab - 04/04/21 16:52:00 EDT, Stop date 04/04/21 16:52:00 EDT     Pain Present :   Yes actual or suspected pain   OT Therapeutic Activity,Mobility,Balance :   Yes   OT Manual Therapy Provided :   Yes   BENTON, OT, ANN K - 04/07/2021 15:40 EDT   Pain Assessment   Pain Present :   Yes actual or suspected pain   Pain Present :   Neck   Laterality :   Bilateral   Quality :   Radiating, Sharp, Tenderness, Tightness   Time Pattern :   Intermittent   Self Report Pain :   Numeric rating scale   BENTON, OT, ANN K - 04/07/2021 15:40 EDT   Therapeutic Activities   OT Therapeutic Activities RTF :   Therapeutic Activities    No qualifying data available     BENTON, OT, ANN K - 04/07/2021 15:40 EDT   OT Therapeutic Activities Grid     Activity 1  Activity 2        Activities :    Transitional movement              Comments :    Rolling right to left and left to right post manual therapy treatment with attempts to come to full upright position sitting edge of bed.  Pt able to get somewhat upright with use of bed, legs over the side, but  unable to move from bed to sit erect second   secondary to the pain he described in his right side and cervical posterior area.   Was improved from a.m. - pt notes he felt better in moving.  Pt moving UE more,  in particular the right as he was showing therapist different things he had been doing             BENTON, OT, ANN K - 04/07/2021 15:40 EDT  BENTON, OT, ANN K - 04/07/2021 15:40 EDT        Manual Therapy/Massage   Manual Therapy/Massage Grid     Activity 1  Activity 2        Type :    Mobilization, Myofascial/Soft tissue mobilization   Mobilization, Myofascial/Soft tissue mobilization           Region :    Right shoulder, Right scapula, Other: right upper pec, right upper trap              Rationale :    Decrease spasm, Increase range of motion, Relaxation              Response :    Decreased pain,  Demonstrated positive response to treatment, Tolerated well              Comment  (Comment: Very gentle pressure applied and held in place with slight myofascial technique to decrease tightness and pain.  Very gentle shoulder girdle mobilization with slightest movement to not aggravate pain and spasm.  Pt tolerated well to point where he wanted  [BENTON, OT, ANN K - 04/07/2021 15:40 EDT] )  (Comment: to try and get up out of bed and walk to bathroom, but once upright unable to progress to out of bed.   ALISIA, OT, ANN K - 04/07/2021 15:40 EDT] )        LORELLA, OT, ANN K - 04/07/2021 15:40 EDT  BENTON, OT, ANN K - 04/07/2021 15:40 EDT        Short Term Goals   Bathing Goal Grid     Goal #1          Activity :    Shower transfer              Assist :    Minimal assistance              Equipment :    Tub bench              Status :    Initial                BENTON, OT, ANN K - 04/07/2021 15:40 EDT         Toileting and Transfers Goal Grid     Goal #1          Activity :    Toilet transfers              Assist :    Minimal assistance              Transfer Equipment :    Drop arm commode              Status :    Initial                BENTON, OT, ANN K - 04/07/2021 15:40 EDT         OT Balance Goal Grid     Goal #1  Type :    Static standing              Descriptors :    Two upper extremities support              Assist :    Contact guard assistance              Length of Time (minutes) :    2 minutes              Rationale :    Improve independence with activities of daily living              Status :    Initial                BENTON, OT, ANN K - 04/07/2021 15:40 EDT         OT ST Goals Reviewed :   Yes   BENTON, OT, ANN K - 04/07/2021 15:40 EDT   Assessment   OT Impairments or Limitations :   Balance deficits, Basic activity of daily living deficits, Coordination deficits, Endurance deficits, Equipment training, IADL deficits, Mobility deficits, Pain, Range of motion deficits, Safety awareness deficits,  Sensory deficits, Strength deficits   Barriers to Safe Discharge OT :   Medical diagnosis, Past medical history   OT Discharge Recommendations :   Home with wife, HHOT     OT Treatment Recommendations :   Patient continues to be extremely painful - initial part of session started as 10/10 and nursing giving pt meds.   Pt grimacing in pain and not moving well, difficulty rolling.  Worked on very gentle massage/myfacial techniques with gentle scapual and shoulder girdle mobilization,  a very gentle pressure to help relax and release what patient refers to as locking up.  Pt almost got to a full upright position on edge of bed to attempt to go to restroom with walker - but began to feel pain that stopped him.  It was better than before, and released quickly, but still not tolerating getting out of bed.      BENTON, OT, ANN K - 04/07/2021 15:40 EDT   Time Spent With Patient   OT Time In :   14:05 EST   OT Time Out :   15:30 EST   OT Manual Therapy Units :   4 units   OT Manual Therapy Treatment Time :   60 minutes   OT FUNCTIONAL TRNG 15 MIN :   2 units   OT Functional Activities Minutes :   30 minutes   OT Total Individual Therapy Time Rehab :   90    OT Total Timed Code Treatment Unit Rehab :   6    OT Total Timed Code Treatment Minutes Rehab :   90    OT Total Treatment Time Rehab :   90 minutes   BENTON, OT, ANN K - 04/07/2021 15:40 EDT   Image 1 -  Images currently included in the form version of this document have not been included in the text rendition version of the form.   Safety   Safety Time Rehab 1 :   15:30 EST   Environmental Safety Implemented Rehab 1 :   Bed alarm, Call device within reach, Personal items within reach, Handed off to another staff member   Patient Position Rehab 1 :   Supine in bed   BENTON, OT, ANN K - 04/07/2021 15:40 EDT

## 2021-04-07 NOTE — Progress Notes (Signed)
PM&R Progress Note        Patient:   Jesse Savage, Jesse Savage            MRN: [id number]            FIN: [id number]               Age:   60 years     Sex:  Male     DOB:  1960/09/18   Associated Diagnoses:   None   Author:   Marianna Fuss J-DO      Subjective: Patient was seen in his room.  Case was discussed with nursing.  He developed a rash on his left back and was noted to be sweating in bed last night.  Denies a fever or chills.  No elevated temperature noted overnight.  He reports pain is a little bit better with adjustments that were made.      Objective:   Vital Signs (last 24 hrs)_____  Last Charted___________  Temp Oral     37.1 degC  (OCT 30 05:55)  Heart Rate Peripheral   69 bpm  (OCT 30 05:55)  Resp Rate         18 br/min  (OCT 30 05:55)  SBP      H 18mHg  (OCT 30 05:55)  DBP      88 mmHg  (OCT 30 05:55)  SpO2      97 %  (OCT 30 05:55)      General: Pleasant, no distress, cooperative throughout exam  HEENT: Conjunctiva clear bilaterally, oral mucosa moist  Neck: Trachea midline  CV: S1-S2  Lungs: Clear to auscultation bilaterally   Abdomen: Soft, nontender, nondistended, normoactive bowel sounds are present  Extremities: No clubbing or cyanosis, no edema  Neurologic: Alert, bright affect, follows commands consistently, motor and sensory examinations are stable   Skin: Erythematous papular rash appreciated on the left posterior trunk    Mobility     Event Name  Event Result  Date/Time    Roll Left Rehab Supervision or touching assistance - Helper provides verbal cues and/or touching/steadying and/or contact guard assistance as patient/resident completes activity. Assistance may be provided throughout the activity or intermittently. - 04 04/07/21 10:26:00   Roll Right Rehab Supervision or touching assistance - Helper provides verbal cues and/or touching/steadying and/or contact guard assistance as patient/resident completes activity. Assistance may be provided throughout the activity or intermittently. - 04  04/07/21 10:26:00   Roll Left and Right Rehab Supervision or touching assistance - Helper provides verbal cues and/or touching/steadying and/or contact guard assistance as patient/resident completes activity. Assistance may be provided throughout the activity or intermittently. - 04 04/07/21 10:26:00   Roll Supine Rehab Supervision or touching assistance - Helper provides verbal cues and/or touching/steadying and/or contact guard assistance as patient/resident completes activity. Assistance may be provided throughout the activity or intermittently. - 04 04/07/21 10:26:00   Chair, Bed to Chair Transfer Rehab Substantial/Maximal assistance - Helper does MORE THAN HALF the effort. Helper lifts or holds trunk or limbs and provides more than half the effort. - 02 04/05/21 08:41:00   Car Transfer Rehab Not attempted due to medical condition or safety concerns - 88 04/05/21 08:41:00   Lying to Sitting on Side of Bed Rehab Partial/Moderate assistance - Helper does LESS THAN HALF the effort. Helper lifts, holds or supports trunk or limbs, but provides less than half the effort. - 03 04/05/21 08:41:00   Sit to Stand Rehab Substantial/Maximal assistance - Helper does  MORE THAN HALF the effort. Helper lifts or holds trunk or limbs and provides more than half the effort. - 02 04/05/21 08:41:00   Stand to Sit Assist Rehab Substantial/Maximal assistance - Helper does MORE THAN HALF the effort. Helper lifts or holds trunk or limbs and provides more than half the effort. - 02 04/05/21 08:41:00     Ambulation     Event Name  Event Result  Date/Time    Walk 10 Feet Rehab Not attempted due to medical condition or safety concerns - 88 04/07/21 10:26:00   Walk 50 Feet with Two Turns Rehab Not attempted due to medical condition or safety concerns - 88 04/07/21 10:26:00   Walk 150 Feet Rehab Not attempted due to medical condition or safety concerns - 88 04/07/21 10:26:00   Walking 10 Feet Uneven Surfaces Rehab Not attempted due to medical  condition or safety concerns - 88 04/07/21 10:26:00   1 Step (Curb) Rehab Not attempted due to medical condition or safety concerns - 88 04/07/21 10:26:00   Ramp Ambulation Assist Rehab Not attempted due to medical condition or safety concerns - 88 04/07/21 10:26:00   4 Steps Rehab Not attempted due to medical condition or safety concerns - 88 04/07/21 10:26:00   12 Steps Rehab Not attempted due to medical condition or safety concerns - 88 04/07/21 10:26:00   Picking Up Object Rehab Not attempted due to medical condition or safety concerns - 88 04/07/21 10:26:00     Wheelchair Mobility     Event Name  Event Result  Date/Time    Wheel 50 Feet with Two Turns Rehab Supervision or touching assistance - Helper provides verbal cues and/or touching/steadying and/or contact guard assistance as patient/resident completes activity. Assistance may be provided throughout the activity or intermittently. - 04 04/05/21 08:41:00   Wheel 150 Feet Rehab Supervision or touching assistance - Helper provides verbal cues and/or touching/steadying and/or contact guard assistance as patient/resident completes activity. Assistance may be provided throughout the activity or intermittently. - 04 04/05/21 08:41:00     Activities of Daily Living     Event Name  Event Result  Date/Time    Eating Rehab Setup or clean-up assistance - Helper sets up or cleans up; Patient/Resident completes activity. Helper assists only prior to or following the activity. - 05 04/05/21 11:55:00   Oral Hygiene Rehab Setup or clean-up assistance - Helper sets up or cleans up; Patient/Resident completes activity. Helper assists only prior to or following the activity. - 05 04/05/21 11:55:00   [place] assistance - Helper does LESS THAN HALF the effort. Helper lifts, holds or supports trunk or limbs, but provides less than half the effort. - 01 04/05/21 11:55:00   Toilet Transfer Rehab Partial/Moderate assistance - Helper does LESS THAN  HALF the effort. Helper lifts, holds or supports trunk or limbs, but provides less than half the effort. - 03 04/05/21 11:55:00   Shower, Bathe Self Rehab Partial/Moderate assistance - Helper does LESS THAN HALF the effort. Helper lifts, holds or supports trunk or limbs, but provides less than half the effort. - 03 04/05/21 11:55:00   Upper Body Dressing Rehab Supervision or touching assistance - Helper provides verbal cues and/or touching/steadying and/or contact guard assistance as patient/resident completes activity. Assistance may be provided throughout the activity or intermittently. - 04 04/05/21 11:55:00   Lower Body Dressing Rehab Supervision or touching assistance - Helper provides verbal cues and/or touching/steadying and/or contact guard assistance as patient/resident completes activity. Assistance may be  provided throughout the activity or intermittently. - 04 04/05/21 11:55:00   Putting On, Taking Off Footwear Rehab Setup or clean-up assistance - Helper sets up or cleans up; Patient/Resident completes activity. Helper assists only prior to or following the activity. - 05 04/05/21 11:55:00   Grooming Assist Rehab Setup or clean-up assistance - Helper sets up or cleans up; Patient/Resident completes activity. Helper assists only prior to or following the activity. - 05 04/05/21 11:55:00   Tub Transfer Assist Rehab Not attempted due to medical condition or safety concerns - 88 04/05/21 11:55:00   Shower Transfer Assist Rehab Not attempted due to medical condition or safety concerns - 88 04/05/21 11:55:00         Labs (Last four charted values)  WBC                  H 11.3 (OCT 29) H 11.6 (OCT 28)   Hgb                  14.2 (OCT 29) 14.7 (OCT 28)   Hct                  41.8 (OCT 29) 44.0 (OCT 28)   Plt                  294 (OCT 29) 313 (OCT 28)   Na                   L 133 (OCT 29) 135 (OCT 28)   K                    4.5 (OCT 29) 4.2 (OCT 28)   CO2                  24 (OCT 29) H 30 (OCT 28)   Cl                    98 (OCT 29) 98 (OCT 28)   Cr                   L 0.5 (OCT 29) 0.7 (OCT 28)   BUN                  15 (OCT 29) 9 (OCT 28)   Glucose Random       H 136 (OCT 29) 85 (OCT 28)       Medications (21) Active  Scheduled: (8)  acetaminophen 500 mg Tab  1,000 mg 2 tabs, Oral, TID  amitriptyline 25 mg Tab  25 mg 1 tabs, Oral, Once a Day (at bedtime)  docusate-senna 50 mg-8.6 mg Tab  1 tabs, Oral, BID  hydrocortisone 1% Lotion 118 mL  1 app, Topical, TID  lidocaine 5% Topical Film  2 patches, Topical, Daily  polyethylene glycol 3350 Oral Powder for Recon  17 g 1 packets, Oral, Daily  Standard Oral Supplement  350 Calories, Oral, BID  tiZANidine 4 mg Tab  8 mg 2 tabs, Oral, q6hr-INT  Continuous: (0)  PRN: (13)  A Patient Specific Medication  1 EA, Kit-Combo, q60mn  A Patient Specific Refrigerated Medication  1 EA, Kit-Combo, q547m  bisacodyl 10 mg Rectal Supp  10 mg 1 supp, PR, Daily  Delivery and Return Bin Access  1 EA, Kit-Combo, q5m92m lactulose 10 g/15 mL Oral Syrup 30 mL  20 g 30 mL, Oral, Daily  lidocaine 1% PF Inj Soln 2 mL  0.25  mL, ID, q16mn  lidocaine 2% Topical Gel with applicator 150-09mL  1 app, Topical, q591m  oxyCODONE 5 mg Tab  10 mg 2 tabs, Oral, q4hr  Respiratory MDI Treatment  1 EA, Kit-Combo, q5m37m sodium chloride 0.9% Inj Soln 10 mL syringe  30 mL, IV Push, q5mi69msodium chloride 0.9% Inj Soln 10 mL vial PF  30 mL, IV Push, q5min45modium chloride flush  10 mL, IV Push, q5min 27merile water Inj Soln 10 mL  10 mL, N/A, q5min  26m   Impression:     1.  Cervical myelopathy  2.  Hypertension  3.  Obstructive sleep apnea  4.  History of DVT  5.  Contact dermatitis    Plan:     Continue with comprehensive multidisciplinary rehabilitation.  Continue lidocaine patches and amitriptyline 25 mg at bedtime which he states he has been on in the past and that was effective.  Tylenol 1000 mg every 8 hours.  Continue oxycodone 5 to 10 mg every 4 hours as needed for moderate to severe pain.  Increase dose of  tizanidine to 8 mg 4 times a day.  Waffle overlay mattress and hydrocortisone lotion for the rash.     Signature Line     Electronically Signed on 04/07/2021 12:23 PM EDT   ________________________________________________   LIVESAYMarianna Fuss

## 2021-04-07 NOTE — Progress Notes (Signed)
 PT Inpatient Daily Documentation - Text       PT Inpatient Daily Documentation Entered On:  04/07/2021 10:33 EDT    Performed On:  04/07/2021 10:26 EDT by Kathreen Nixon M-PT               Reason for Treatment   *Reason for Referral :   Dx: 60 yo s/p C5-6 bilateral laminectomy for decompression of spinal cord per Dr. 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  Precautions: Cervical spine prec, soft c-collar PRN for pain, PAIN LIMITING FNX  PMHx: Prior ACDF,  Activity: 15 hr  TC date: Thursday - Patti Kathreen Nixon M-PT - 04/07/2021 10:26 EDT   Review/Treatments Provided   PT Therapeutic Exercise :   Yes   Kathreen Nixon M-PT - 04/07/2021 10:35 EDT   PT Goals :   PT Short Term Goals    04/05/2021  Bed Mobility Goal #1: Sit to supine, Supine to sit; Close S; Initial  Transfer Goal #1: Sit to stand, Stand to sit; Mod A; Rolling walker; Initial  Transfer Goal #2: Stand step; Mod A; Rolling walker; Initial  Ambulation Goal #1: Level surfaces; Mod A; 15; Rolling walker; Initial  Balance Goal #1: Supported stand; Close S; Static standing; Rolling walker; 3; Improve independence with activities of daily living; Initial     PT Plan :   Treatment Frequency:  Daily (modified)   Performed By: Kathreen Nixon M-PT  04/05/2021 08:41  Treatment Duration: 2 Performed By: Kathreen Nixon M-PT  04/05/2021 08:41  Planned Treatments: Balance training, Basic activities of daily living, Bed mobility training, Caregiver training, Community/Work reintegration, Equipment training, Functional training, Gait training, Group therapy, Manual therapy, Moist heat/ice, Neuromuscular reeducatio... Performed By: Kathreen Nixon M-PT  04/05/2021 08:41     Short Term Goals Reviewed :   Yes   Physical Therapy Orders :   Physical Therapy Inpatient Additional Treatment Rehab - 04/05/21 12:36:10 EDT, Balance training, Basic activities of daily living, Bed mobility training, Caregiver training, Community/Work reintegration,  Equipment training, Functional training, Gait training, Group therapy, Manual therapy, Moist heat/ice,...     Pain Present :   Yes actual or suspected pain   PT Therapeutic Activity,Mobility,Balance :   Yes   Kathreen Nixon M-PT - 04/07/2021 10:26 EDT   Pain Assessment   Pain Present :   Yes actual or suspected pain   Pain Present :   Back   Self Report Pain :   Numeric rating scale   Numeric Pain Scale :   9   Numeric Pain Score :   9    Kathreen Nixon M-PT - 04/07/2021 10:26 EDT   Therapeutic Activities/Mobility/Balance   Reassess Mobility :   Yes   Kathreen Nixon M-PT - 04/07/2021 10:35 EDT   Functional Activity :   Therapeutic Activities    No qualifying data available     Kathreen Nixon M-PT - 04/07/2021 10:26 EDT   PT Therapeutic Activities Grid     Activity 1          Activity :    Bed mobility              Comment :    pt perf multiple rolling in bed R<>L c bed rails and inc time, c SPV for safety, pt attempted to sit EOB supine>sit c hospital fnxs and PT assist x 5 c pt completing  full trf to upright x 1, however tolerated <10 before return to S/Ling 2* pain.                Kathreen Nixon M-PT - 04/07/2021 10:26 EDT         PT Mobility   PT Mobility 2019   Roll Left :   Supervision or touching assistance - Helper provides verbal cues and/or touching/steadying and/or contact guard assistance as patient/resident completes activity. Assistance may be provided throughout the activity or intermittently. - 04   Roll Right :   Supervision or touching assistance - Helper provides verbal cues and/or touching/steadying and/or contact guard assistance as patient/resident completes activity. Assistance may be provided throughout the activity or intermittently. - 04   Roll Left and Right :   Supervision or touching assistance - Helper provides verbal cues and/or touching/steadying and/or contact guard assistance as patient/resident completes activity. Assistance may be provided throughout the activity or intermittently. - 04    Roll Supine :   Supervision or touching assistance - Helper provides verbal cues and/or touching/steadying and/or contact guard assistance as patient/resident completes activity. Assistance may be provided throughout the activity or intermittently. - 04   Kathreen Nixon M-PT - 04/07/2021 10:35 EDT   Independent - Patient/Resident completes the activities by him/herself, with or without an assistive device, with no assistance from a helper. - 06 :   fnxl mob grid above and below reflect post-PT intervention c use of bed fnxs and BRs as pt reporting signif pain.    pt unable to perf any amb despite inc attempts/time other than stand step trfs from [employer] M-PT - 04/07/2021 10:35 EDT   PT Ambulation 2019   Walk 10 Feet :   Not attempted due to medical condition or safety concerns - 88   Walk 50 Feet with Two Turns :   Not attempted due to medical condition or safety concerns - 88   Walk 150 Feet :   Not attempted due to medical condition or safety concerns - 88   Walking 10 Feet Uneven Surfaces :   Not attempted due to medical condition or safety concerns - 88   1 Step (Curb) :   Not attempted due to medical condition or safety concerns - 88   Ramp Ambulation :   Not attempted due to medical condition or safety concerns - 88   4 Steps :   Not attempted due to medical condition or safety concerns - 88   12 Steps :   Not attempted due to medical condition or safety concerns - 88   Picking Up Object :   Not attempted due to medical condition or safety concerns - 60   Kathreen Nixon M-PT - 04/07/2021 10:35 EDT   Transfer Type :   Stand step   Ambulation Device Utilized :   Rolling walker   Distance Level Surface :   3 ft   PT Mobility Reviewed :   Yes   Kathreen Nixon M-PT - 04/07/2021 10:35 EDT   Therapeutic Exercise   Therapeutic Exercise RTF :   Therapeutic Exercise    No qualifying data available     Kathreen Nixon M-PT - 04/07/2021 10:35 EDT   Kathreen Nixon M-PT - 04/07/2021 10:35 EDT   Therapeutic Exercise  Grid     Exercise 1          Exercise :    Lower extremity strengthening  Comment :    pt perf supine therex 15 x 3 sets c 3#ankle wts and GTB: for inc LE strength and inc activity tolerance: SAQ, SLRs, hip ABD c GTB, marches, hip ADD c 3 sec hold, bridges c GTB                Kathreen Nixon M-PT - 04/07/2021 10:47 EDT         Short Term Goals   PT ST Goals Reviewed :   Yes   Kathreen Nixon M-PT - 04/07/2021 10:35 EDT   Bed Mobility Goal Grid     Goal #1          Descriptors :    Sit to supine, Supine to sit              Level :    Close supervision              Status :    Progressing, continue                Kathreen Nixon M-PT - 04/07/2021 10:35 EDT         Transfers Goal Grid     Goal #1  Goal #2        Descriptors :    Sit to stand, Stand to sit   Stand step           Level :    Moderate assistance   Moderate assistance           Device :    Rolling walker   Rolling walker           Status :    Initial   Initial             Kathreen Nixon M-PT - 04/07/2021 10:26 EDT  Kathreen Nixon M-PT - 04/07/2021 10:26 EDT        Ambulation Goal Grid     Goal #1          Descriptors :    Level surfaces              Level :    Moderate assistance              Distance/# of Steps :    15              Device :    Rolling walker              Status :    Initial                Kathreen Nixon M-PT - 04/07/2021 10:26 EDT         PT Balance Goal Grid     Goal #1          Descriptor :    Supported stand              Assist Level :    Close supervision              Type :    Static standing              Devices :    Rolling walker              Length of Time (minutes) :    3 minutes              Rationale :    Improve independence with activities of daily  living              Status :    Initial                Kathreen Nixon M-PT - 04/07/2021 10:26 EDT         Assessment   PT Impairments or Limitations :   Abnormal tone, Ambulation deficits, Balance deficits, Bed mobility deficits, Endurance deficits, Equipment training, Pain limiting  function, Strength deficits, Transfer deficits, Transition deficits, Wheelchair mobility deficits   Barriers to Safe Discharge PT :   Medical diagnosis, Past medical history, Severity of deficits   Discharge Recommendations :   d/c home c spouse    OPPT vs HH    DME: ?          PT Treatment Recommendations :   After attempting to get pt sitting EOB/OOB for ~45' PT discont further attempts and remainder of trx session on therex for BLEs from bed lvl. pt cont to be severly limited by pain pt reporting as 9.5/10 c all attempts to get sitting EOB includ use of bed fnxs to elevate HOB as high as possible and PT assisting. pt may cont to benefit from skilled IPPT to inc overall LOF pending pt tolerance for activity and pain managmt.      Kathreen Nixon M-PT - 04/07/2021 10:26 EDT   Time Spent With Patient   PT Concurrent Therapeutic Exercise Time :   30 minutes   PT Total Concurrent Therapy Time Rehab :   30    Kathreen Nixon M-PT - 04/07/2021 15:34 EDT   PT Time In :   9:30 EST   PT Time Out :   11:00 EST   PT Therapeutic Exercise Units :   3 units   Kathreen Nixon M-PT - 04/07/2021 10:38 EDT   PT Therapeutic Exercise Time :   15 minutes   Kathreen Nixon M-PT - 04/07/2021 15:34 EDT     PT Self Care ADL Training Units :   3 units   PT Self Care ADL Training Time :   45 minutes   Kathreen Nixon M-PT - 04/07/2021 10:38 EDT   PT Total Individual Time Rehab :   60    Kathreen Nixon M-PT - 04/07/2021 15:34 EDT     PT Total Timed Code Treatment Unit Rehab :   6    PT Total Timed Code Treatment Minutes Rehab :   90    PT Total Treatment Time Rehab :   90 minutes   Kathreen Nixon M-PT - 04/07/2021 10:38 EDT   Safety   Safety Time Rehab 1 :   11:00 EST   Environmental Safety Implemented Rehab 1 :   Bed alarm, Call device within reach, Personal items within reach   Patient Position Rehab 1 :   Supine in bed   Kathreen Nixon M-PT - 04/07/2021 10:43 EDT

## 2021-04-08 NOTE — Progress Notes (Signed)
 PT Inpatient Daily Documentation - Text       PT Inpatient Daily Documentation Entered On:  04/08/2021 8:08 EDT    Performed On:  04/08/2021 9:30 EDT by Jesse Savage               Reason for Treatment   Subjective Statement :   it is on the verge of being very painful if I move so I can't do that right now. the doctor is going to increased my pain medication     *Chief Complaint :   pain RUE/scapula/shoulder/neck     Jesse Savage - 04/08/2021 12:34 EDT   *Reason for Referral :   Dx: 60 yo s/p C5-6 bilateral laminectomy for decompression of spinal cord per Dr. 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  Precautions: Cervical spine prec, soft c-collar PRN for pain, PAIN LIMITING FNX  PMHx: Prior ACDF,  Activity: 15 hr  TC date: Thursday - Jesse Savage - 04/08/2021 12:42 EDT   Review/Treatments Provided   PT Goals :   PT Short Term Goals    04/07/2021  Bed Mobility Goal #1: Sit to supine, Supine to sit; Close S; Progressing, continue  Transfer Goal #1: Sit to stand, Stand to sit; Mod A; Rolling walker; Initial  Transfer Goal #2: Stand step; Mod A; Rolling walker; Initial  Ambulation Goal #1: Level surfaces; Mod A; 15; Rolling walker; Initial  Balance Goal #1: Supported stand; Close S; Static standing; Rolling walker; 3; Improve independence with activities of daily living; Initial     PT Plan :   Treatment Frequency:  Daily (modified)   Performed By: Jesse Savage M-PT  04/05/2021 08:41  Treatment Duration: 2 Performed By: Jesse Savage M-PT  04/05/2021 08:41  Planned Treatments: Balance training, Basic activities of daily living, Bed mobility training, Caregiver training, Community/Work reintegration, Equipment training, Functional training, Gait training, Group therapy, Manual therapy, Moist heat/ice, Neuromuscular reeducatio... Performed By: Jesse Savage M-PT  04/05/2021 08:41     Short Term Goals Reviewed :   Yes   Physical Therapy Orders :    Physical Therapy Inpatient Additional Treatment Rehab - 04/05/21 12:36:10 EDT, Balance training, Basic activities of daily living, Bed mobility training, Caregiver training, Community/Work reintegration, Equipment training, Functional training, Gait training, Group therapy, Manual therapy, Moist heat/ice,...     Pain Present :   Yes actual or suspected pain   PT Therapeutic Activity,Mobility,Balance :   Yes   PT Therapeutic Exercise :   Yes   Jesse Savage - 04/08/2021 12:34 EDT   Pain Assessment   Pain Present :   Yes actual or suspected pain   Pain Present :   Neck   Laterality :   Right   Quality :   Aching, Discomfort, Sharp   Time Pattern :   Constant   Duration :   3/10 without movement that will increased to 8/10 if I move   Self Report Pain :   Numeric rating scale   Jesse Savage - 04/08/2021 12:34 EDT   Therapeutic Activities/Mobility/Balance   Functional Activity :   Therapeutic Activities  Activity 1:  Bed mobility; pt perf multiple rolling in bed R<>L c bed rails and inc time, c SPV for safety, pt attempted to sit EOB supine>sit c hospital fnxs and PT assist x 5 c pt completing full trf to upright x 1, however tolerated <10 before return  to S/Ling 2* pain.       Performed Date:  04/07/2021     Jesse Savage - 04/08/2021 12:34 EDT   PT Therapeutic Activities Grid     Activity 1          Activity :    Bed mobility              Assist :    Close supervision              Position :    Other: supine-> L sidelying partial roll for pain management              Equipment :    Other: bedrail              Response :    Decreased pain                Jesse Savage - 04/08/2021 12:34 EDT         Reassess Mobility :   Yes   Jesse Savage - 04/08/2021 12:34 EDT   PT Mobility   PT Mobility 2019   Roll Left :   Supervision or touching assistance - Helper provides verbal cues and/or touching/steadying and/or contact guard assistance as patient/resident completes activity. Assistance  may be provided throughout the activity or intermittently. - 04   Roll Right :   Not attempted due to medical condition or safety concerns - 88   Roll Left and Right :   Not attempted due to medical condition or safety concerns - 88   Roll Prone :   Not attempted due to medical condition or safety concerns - 88   Roll Supine :   Not attempted due to medical condition or safety concerns - 88   Sit to Lying :   Not attempted due to medical condition or safety concerns - 88   Lying to Sitting on Side of Bed :   Not attempted due to medical condition or safety concerns - 82   Scooting :   Not attempted due to medical condition or safety concerns - 88   Sit to Stand :   Not attempted due to medical condition or safety concerns - 88   Stand to Sit :   Not attempted due to medical condition or safety concerns - 100   Chair, Bed to Chair Transfer :   Not attempted due to medical condition or safety concerns - 70   Car Transfer :   Not attempted due to medical condition or safety concerns - 46   Jesse,  Jesse Savage-PT - 04/08/2021 12:34 EDT   Independent - Patient/Resident completes the activities by him/herself, with or without an assistive device, with no assistance from a helper. - 06 :   fnxl mob grid above and below reflect post-PT intervention c use of bed fnxs and BRs as pt reporting signif pain.    pt unable to perf any amb despite inc attempts/time other than stand step trfs from bed>WC (EVAL)   Jesse Savage - 04/08/2021 12:34 EDT   PT Ambulation 2019   Walk 10 Feet :   Not attempted due to medical condition or safety concerns - 88   Walk 50 Feet with Two Turns :   Not attempted due to medical condition or safety concerns - 88   Walk 150 Feet :   Not attempted due to medical condition or safety concerns - 88   Walking 10 Feet Uneven Surfaces :  Not attempted due to medical condition or safety concerns - 88   1 Step (Curb) :   Not attempted due to medical condition or safety concerns - 88   Ramp Ambulation :    Not attempted due to medical condition or safety concerns - 88   4 Steps :   Not attempted due to medical condition or safety concerns - 88   12 Steps :   Not attempted due to medical condition or safety concerns - 88   Picking Up Object :   Not attempted due to medical condition or safety concerns - [address]  Jesse Savage-PT - 04/08/2021 12:34 EDT   PT Mobility Reviewed :   Chaney Davis,  Jesse Savage-PT - 04/08/2021 12:34 EDT   Therapeutic Exercise   Therapeutic Exercise RTF :   Therapeutic Exercise  Exercise 1:  Lower extremity strengthening; pt perf supine therex 15 x 3 sets c 3#ankle wts and GTB: for inc LE strength and inc activity tolerance: SAQ, SLRs, hip ABD c GTB, marches, hip ADD c 3 sec hold, bridges c GTB       Performed Date:  04/07/2021     Davis Savage - 04/08/2021 12:34 EDT   Therapeutic Exercise Grid     Exercise 1          Exercise :    Lower extremity active range, Lower extremity strengthening              Position :    Supine              Repetition/Time :    10-15reps x 3-4 sets each LE              Comment :    straight leg raises, hip abduction slides, posterior pelvic tilts in hooklying/head of bed flat, hip flexion and heel slides. rests between sets as needed.                 Davis Savage - 04/08/2021 12:34 EDT         Short Term Goals   Bed Mobility Goal Grid     Goal #1          Descriptors :    Sit to supine, Supine to sit              Level :    Close supervision              Status :    Progressing, continue                Davis Savage - 04/08/2021 12:34 EDT         Transfers Goal Grid     Goal #1  Goal #2        Descriptors :    Sit to stand, Stand to sit   Stand step           Level :    Moderate assistance   Moderate assistance           Device :    Rolling walker   Rolling walker           Status :    Initial   Initial             Davis Savage - 04/08/2021 12:34 EDT  Davis Savage - 04/08/2021 12:34 EDT        Ambulation Goal Grid     Goal #  1           Descriptors :    Level surfaces              Level :    Moderate assistance              Distance/# of Steps :    15              Device :    Rolling walker              Status :    Initial                Jesse Savage - 04/08/2021 12:34 EDT         PT Balance Goal Grid     Goal #1          Descriptor :    Supported stand              Assist Level :    Close supervision              Type :    Static standing              Devices :    Rolling walker              Length of Time (minutes) :    3 minutes              Rationale :    Improve independence with activities of daily living              Status :    Initial                Jesse Savage - 04/08/2021 12:34 EDT         PT ST Goals Reviewed :   Chaney Jesse,  Jesse Savage-PT - 04/08/2021 12:34 EDT   Education   Responsible Learner Present for Session :   Yes   Home Caregiver Name/Relationship :   patient   Teaching Method :   Demonstration, Explanation   Jesse Savage - 04/08/2021 12:34 EDT   Physical Therapy Education Grid   Bed Mobility :   Verbalizes understanding, Demonstrates, Needs further teaching, Needs practice/supervision   Bed Positioning :   Verbalizes understanding, Demonstrates, Needs further teaching, Needs practice/supervision   Body Mechanics :   Verbalizes understanding, Demonstrates, Needs further teaching, Needs practice/supervision   Jesse Savage - 04/08/2021 12:34 EDT   Assessment   PT Impairments or Limitations :   Abnormal tone, Ambulation deficits, Balance deficits, Bed mobility deficits, Endurance deficits, Equipment training, Pain limiting function, Strength deficits, Transfer deficits, Transition deficits, Wheelchair mobility deficits   Barriers to Safe Discharge PT :   Medical diagnosis, Past medical history, Severity of deficits   Discharge Recommendations :   d/c home c spouse    OPPT vs HH    DME: ?          PT Treatment Recommendations :   Patient continues to be limited by pain in neck/R  shoulder/scapula. Patient defers bed mobility unless slight roll to L due to pain. Educated on mobility benefits and pain management throughout session. Patient performed supine LE strengthening/ROM exercises during session but unable to trial edge of bed sit. Patient will continue to benefit from skilled PT to progress activity tolerance, manage pain and improve functional mobility.     Jesse Savage - 04/08/2021 12:34 EDT  Time Spent With Patient   PT Time In :   9:30 EST   PT Time Out :   10:30 EST   PT Therapeutic Exercise Units :   3 units   PT Therapeutic Exercise Time :   40 minutes   PT Self Care ADL Training Units :   1 units   PT Self Care ADL Training Time :   20 minutes   PT Total Individual Time Rehab :   60    PT Total Timed Code Treatment Unit Rehab :   4    PT Total Timed Code Treatment Minutes Rehab :   60    PT Total Treatment Time Rehab :   60 minutes   Jesse Savage - 04/08/2021 12:34 EDT   Safety   Safety Time Rehab 1 :   10:30 EST   Environmental Safety Implemented Rehab 1 :   Bed alarm, Wheels locked, Call device within reach, Personal items within reach   Patient Position Rehab 1 :   Supine in bed   [place],  Jesse Savage-PT - 04/08/2021 12:34 EDT

## 2021-04-08 NOTE — Consults (Signed)
Pastoral Care Consultation               Attempted pastoral care visit, but pt was not available at this time. The Pastoral Care team is available for any spiritual/emotional needs that may rise. Please do not hesitate to reach out via phone or Telemediq if needed.     Signature Line     Electronically Signed on 04/08/2021 02:36 PM EDT   ________________________________________________   Olena Leatherwood

## 2021-04-08 NOTE — Progress Notes (Signed)
OT Inpatient Daily Documentation - Text       OT Inpatient Daily Documentation Entered On:  04/08/2021 7:06 EDT    Performed On:  04/08/2021 7:04 EDT by Raylene Miyamoto               Reason for Treatment   Subjective Statement :   Pt agreeable to OT treatment, but reports continued elevated R neck/shoulder pain.     *Reason for Referral :   Dx: 60 yo s/p C5-6 bilateral laminectomy for decompression of spinal cord per Dr. 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  Precautions: Cervical spine prec, soft c-collar PRN for pain  PMHx: Prior ACDF, lumbar fusions  Activity: 15 hr  TC date: Thursday Riesa Pope,  Mikal-OT - 04/08/2021 7:04 EDT   Review/Treatments Provided   OT Therapeutic Activity,Mobility,Balance :   Yes   OT Manual Therapy Provided :   [name] - 04/08/2021 12:14 EDT   OT Goals :   OT Short Term Goals    04/07/2021  Bathing Goal #1: Shower transfer; Minimal assistance; Tub bench; Initial  Toileting and Transfers Goal #1: Toilet transfers; Minimal assistance; Drop arm commode; Initial  Balance Goal #1: Two upper extremities support; Static standing; CGA; 2; Improve independence with activities of daily living; Initial     OT Plan :   Treatment Frequency: Daily Performed By: Constance Goltz,  Jaci Standard   04/05/2021  Treatment Duration: 2 Performed By: Raylene Miyamoto   04/05/2021  Planned Treatments: Balance training, Basic Activities of Daily Living, Caregiver training, Coordination, Electric modalities, Energy conservation training, Equipment training, Group therapy, HEP, Home assessment/modification, Home management, Home program, Joint protecti... Performed By: Raylene Miyamoto   04/05/2021     Short Term Goals Reviewed :   Yes   Occupational Therapy Orders :   Occupational Therapy Inpatient Additional Treatment Rehab - 04/05/21 12:23:44 EDT, Balance training, Basic Activities of Daily Living, Caregiver training, Coordination, Electric modalities, Energy  conservation training, Equipment training, Group therapy, HEP, Home assessment/modification, Home management, Home...  OT Inpatient Evaluation and Treatment Rehab - 04/04/21 16:52:00 EDT, Stop date 04/04/21 16:52:00 EDT     Pain Present :   Yes actual or suspected pain   Raylene Miyamoto - 04/08/2021 7:04 EDT   Pain Assessment   Pain Present :   Yes actual or suspected pain   Pain Present :   Neck   Laterality :   Right   Self Report Pain :   Numeric rating scale   Raylene Miyamoto - 04/08/2021 7:04 EDT   Therapeutic Activities   OT Therapeutic Activities RTF :   Therapeutic Activities  Activity 1:  Transitional movement; Rolling right to left and left to right post manual therapy treatment with attempts to come to full upright position sitting edge of bed.  Pt able to get somewhat upright with use of bed, legs over the side, but unable to move from bed to sit erect second       Performed Date:  04/07/2021  Activity 2:  secondary to the pain he described in his right side and cervical posterior area.   Was improved from a.m. - pt notes he felt better in moving.  Pt moving UE more,  in particular the right as he was showing therapist different things he had been doing       Performed Date:  04/07/2021     Constance Goltz,  Mikal-OT - 04/08/2021 12:14 EDT   OT Therapeutic Activities Grid     Activity 1  Activity 2        Comments :    UB bathing, supine with HOB elevated, setup. Rest breaks in order to manage R neck/shoulder pain.   Grooming/oral hygiene, supine with HOB elevated, setup. Rest breaks in order to manage R neck/shoulder pain.             Constance Goltz,  Mikal-OT - 04/08/2021 12:14 EDT  Constance Goltz,  Mikal-OT - 04/08/2021 12:14 EDT        Therapeutic Exercise   Therapeutic Exercise RTF :   Therapeutic Exercise    No qualifying data available     Raylene Miyamoto - 04/08/2021 12:14 EDT   Manual Therapy/Massage   Manual Therapy/Massage Grid     Activity 1          Type :    Myofascial/Soft tissue mobilization              Region :     Cervical spine, Right shoulder, Right scapula              Rationale :    Decrease spasm, Increase range of motion, Relaxation              Response :    Demonstrated positive response to treatment, Tolerated well                Raylene Miyamoto - 04/08/2021 12:14 EDT         Short Term Goals   OT ST Goals Reviewed :   Dossie Arbour,  Mikal-OT - 04/08/2021 12:14 EDT   Constance Goltz,  Mikal-OT - 04/08/2021 12:14 EDT   Bathing Goal Grid     Goal #1          Activity :    Shower transfer              Assist :    Minimal assistance              Equipment :    Tub bench              Status :    Initial                Raylene Miyamoto - 04/08/2021 7:04 EDT         Toileting and Transfers Goal Grid     Goal #1          Activity :    Toilet transfers              Assist :    Minimal assistance              Transfer Equipment :    Drop arm commode              Status :    Initial                Raylene Miyamoto - 04/08/2021 7:04 EDT         OT Balance Goal Grid     Goal #1          Type :    Static standing              Descriptors :    Two upper extremities support              Assist :    Contact guard  assistance              Length of Time (minutes) :    2 minutes              Rationale :    Improve independence with activities of daily living              Status :    Initial                Raylene Miyamoto - 04/08/2021 7:04 EDT         Assessment   OT Impairments or Limitations :   Balance deficits, Basic activity of daily living deficits, Coordination deficits, Endurance deficits, Equipment training, IADL deficits, Mobility deficits, Pain, Range of motion deficits, Safety awareness deficits, Sensory deficits, Strength deficits   Barriers to Safe Discharge OT :   Medical diagnosis, Past medical history   OT Discharge Recommendations :   Home with wife, HHOT     Raylene Miyamoto - 04/08/2021 7:04 EDT   OT Treatment Recommendations :   Pt reports new numbness in L thumb, L index finger over the past 12hrs. Pt only able to  tolerate partial ADL this date, completed all activities supine with HOB elevated due to elevated R neck/shoulder pain with any change in position. Pt may benefit from additional OT services if tolerance for ther ex/activity improves.     Raylene Miyamoto - 04/08/2021 12:14 EDT   Time Spent With Patient   OT Time In :   7:00 EST   OT Time Out :   8:30 EST   OT Manual Therapy Units :   2 units   OT Manual Therapy Treatment Time :   30 minutes   OT Self Care ADL Training Units :   4 units   OT Self Care ADL Training Time :   60 minutes   OT Total Individual Therapy Time Rehab :   90    OT Total Timed Code Treatment Unit Rehab :   6    OT Total Timed Code Treatment Minutes Rehab :   90    OT Total Treatment Time Rehab :   90 minutes   Raylene Miyamoto - 04/08/2021 12:14 EDT   Image 1 -  Images currently included in the form version of this document have not been included in the text rendition version of the form.   Safety   Safety Time Rehab 1 :   8:30 EST   Environmental Safety Implemented Rehab 1 :   Bed in low position, Bed alarm, Wheels locked, Call device within reach, Personal items within reach   Patient Position Rehab 1 :   Supine in bed   [place],  Mikal-OT - 04/08/2021 12:14 EDT

## 2021-04-09 LAB — COMPREHENSIVE METABOLIC PANEL
ALT: 16 U/L (ref 0–50)
AST: 14 U/L (ref 0–50)
Albumin/Globulin Ratio: 1.2 (ref 1.00–2.70)
Albumin: 3.7 g/dL (ref 3.5–5.2)
Alk Phosphatase: 73 U/L (ref 40–130)
Anion Gap: 10 mmol/L (ref 2–17)
BUN: 15 mg/dL (ref 8–23)
CALCIUM,CORRECTED,CCA: 9.6 mg/dL (ref 8.8–10.2)
CO2: 27 mmol/L (ref 22–29)
Calcium: 9.4 mg/dL (ref 8.8–10.2)
Chloride: 100 mmol/L (ref 98–107)
Creatinine: 0.8 mg/dL (ref 0.7–1.3)
Est, Glom Filt Rate: 101 mL/min/1.73m?? (ref >=90)
Globulin: 3.2 g/dL (ref 1.9–4.4)
Glucose: 112 mg/dL — ABNORMAL HIGH (ref 70–99)
Osmolaliy Calculated: 274 mosm/kg (ref 270–287)
Potassium: 4.3 mmol/L (ref 3.5–5.3)
Sodium: 136 mmol/L (ref 135–145)
Total Bilirubin: 0.31 mg/dL (ref 0.00–1.20)
Total Protein: 6.9 g/dL (ref 6.4–8.3)

## 2021-04-09 LAB — CBC
Hematocrit: 45 % (ref 38.0–52.0)
Hemoglobin: 14.8 g/dL (ref 13.0–17.3)
MCH: 29.9 pg (ref 27.0–34.5)
MCHC: 32.9 g/dL (ref 32.0–36.0)
MCV: 90.9 fL (ref 84.0–100.0)
MPV: 9 fL (ref 7.2–13.2)
NRBC Absolute: 0 10*3/uL (ref 0.000–0.012)
NRBC Automated: 0 % (ref 0.0–0.2)
Platelets: 303 10*3/uL (ref 140–440)
RBC: 4.95 x10e6/mcL (ref 4.00–5.60)
RDW: 11.9 % (ref 11.0–16.0)
WBC: 6.7 10*3/uL (ref 3.8–10.6)

## 2021-04-09 NOTE — Progress Notes (Signed)
OT Time Spent With Patient - Text       OT Time Spent With Patient Entered On:  04/09/2021 13:53 EDT    Performed On:  04/09/2021 13:53 EDT by Constance Goltz,  Mikal-OT               Time Spent With Patient   OT Minutes Cancelled Missed Grid     OT Minutes Lost #1          Amount :    30               Reason :    Pain                Raylene Miyamoto - 04/09/2021 13:54 EDT         OT Therapeutic Exercise Units :   0 units   OT Therapeutic Exercise Time :   0 minutes   OT Total Individual Therapy Time Rehab :   0    OT Treatment Time Comment :   Pt unable to participate in scheduled PM OT session due to elevated pain. Med hold proposed.   OT Total Timed Code Treatment Unit Rehab :   0    OT Total Timed Code Treatment Minutes Rehab :   0    OT Total Treatment Time Rehab :   0 minutes   Raylene Miyamoto - 04/09/2021 13:53 EDT   Image 1 -  Images currently included in the form version of this document have not been included in the text rendition version of the form.

## 2021-04-09 NOTE — Progress Notes (Signed)
OT Inpatient Daily Documentation - Text       OT Inpatient Daily Documentation Entered On:  04/09/2021 13:58 EDT    Performed On:  04/09/2021 13:54 EDT by Raylene Miyamoto               Reason for Treatment   Subjective Statement :   Pt agreeable to OT treatment, although reports elevated R shoudler/neck pain.     *Reason for Referral :   Dx: 60 yo s/p C5-6 bilateral laminectomy for decompression of spinal cord per Dr. 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  Precautions: Cervical spine prec, soft c-collar PRN for pain  PMHx: Prior ACDF, lumbar fusions  Activity: 15 hr  TC date: Thursday - Riesa Pope,  Mikal-OT - 04/09/2021 13:54 EDT   Review/Treatments Provided   OT Goals :   OT Short Term Goals    04/08/2021  Bathing Goal #1: Shower transfer; Minimal assistance; Tub bench; Initial  Toileting and Transfers Goal #1: Toilet transfers; Minimal assistance; Drop arm commode; Initial  Balance Goal #1: Two upper extremities support; Static standing; CGA; 2; Improve independence with activities of daily living; Initial     OT Plan :   Treatment Frequency: Daily Performed By: Constance Goltz,  Jaci Standard   04/05/2021  Treatment Duration: 2 Performed By: Raylene Miyamoto   04/05/2021  Planned Treatments: Balance training, Basic Activities of Daily Living, Caregiver training, Coordination, Electric modalities, Energy conservation training, Equipment training, Group therapy, HEP, Home assessment/modification, Home management, Home program, Joint protecti... Performed By: Raylene Miyamoto   04/05/2021     Short Term Goals Reviewed :   Yes   Occupational Therapy Orders :   Occupational Therapy Medical Hold - 04/09/21 13:52:00 EDT, Stop date 04/09/21 13:52:00 EDT  Occupational Therapy Inpatient Additional Treatment Rehab - 04/05/21 12:23:44 EDT, Balance training, Basic Activities of Daily Living, Caregiver training, Coordination, Copywriter, advertising, Energy conservation training, Equipment training, Group  therapy, HEP, Home assessment/modification, Home management, Home...  OT Inpatient Evaluation and Treatment Rehab - 04/04/21 16:52:00 EDT, Stop date 04/04/21 16:52:00 EDT     Pain Present :   Yes actual or suspected pain   OT Therapeutic Activity,Mobility,Balance :   Yes   OT Manual Therapy Provided :   Yes   Raylene Miyamoto - 04/09/2021 13:54 EDT   Pain Assessment   Pain Present :   Yes actual or suspected pain   Pain Present :   Neck   Laterality :   Right   Self Report Pain :   Numeric rating scale   Raylene Miyamoto - 04/09/2021 13:54 EDT   Therapeutic Activities   OT Therapeutic Activities RTF :   Therapeutic Activities  Activity 1:  UB bathing, supine with HOB elevated, setup. Rest breaks in order to manage R neck/shoulder pain.       Performed Date:  04/08/2021  Activity 2:  Grooming/oral hygiene, supine with HOB elevated, setup. Rest breaks in order to manage R neck/shoulder pain.       Performed Date:  04/08/2021     Raylene Miyamoto - 04/09/2021 13:54 EDT   OT Therapeutic Activities Grid     Activity 1  Activity 2        Comments :    UB bathing, supine with HOB elevated, setup. Rest breaks in order to manage R neck/shoulder pain.   Grooming/oral hygiene, supine with HOB elevated, setup. Rest breaks in order to manage  R neck/shoulder pain.             Constance Goltz,  Mikal-OT - 04/09/2021 13:54 EDT  Constance Goltz,  Mikal-OT - 04/09/2021 13:54 EDT        Manual Therapy/Massage   Manual Therapy/Massage Grid     Activity 1          Type :    Myofascial/Soft tissue mobilization              Region :    Cervical spine, Right shoulder              Rationale :    Decrease spasm, Relaxation              Response :    Demonstrated minimal response to treatment                Raylene Miyamoto - 04/09/2021 13:54 EDT         Short Term Goals   Bathing Goal Grid     Goal #1          Activity :    Shower transfer              Assist :    Minimal assistance              Equipment :    Tub bench              Status :    Initial                 Raylene Miyamoto - 04/09/2021 13:54 EDT         Toileting and Transfers Goal Grid     Goal #1          Activity :    Toilet transfers              Assist :    Minimal assistance              Transfer Equipment :    Drop arm commode              Status :    Initial                Raylene Miyamoto - 04/09/2021 13:54 EDT         OT Balance Goal Grid     Goal #1          Type :    Static standing              Descriptors :    Two upper extremities support              Assist :    Contact guard assistance              Length of Time (minutes) :    2 minutes              Rationale :    Improve independence with activities of daily living              Status :    Initial                Raylene Miyamoto - 04/09/2021 13:54 EDT         OT ST Goals Reviewed :   Dossie Arbour,  Mikal-OT - 04/09/2021 13:54 EDT   Assessment   OT Impairments or Limitations :   Balance deficits, Basic activity  of daily living deficits, Coordination deficits, Endurance deficits, Equipment training, IADL deficits, Mobility deficits, Pain, Range of motion deficits, Safety awareness deficits, Sensory deficits, Strength deficits   Barriers to Safe Discharge OT :   Medical diagnosis, Past medical history   OT Discharge Recommendations :   Home with wife, HHOT     OT Treatment Recommendations :   Pt continues to reports fluctuating 10/10 R shoulder/neck pain, able to tolerate select self care ADL's with supine rest breaks and STM to manage symptoms. Pt may benefit from additional OT services if activity tolerance improves/pain decreases.     Raylene Miyamoto - 04/09/2021 13:54 EDT   Time Spent With Patient   OT Time In :   9:00 EST   OT Time Out :   10:00 EST   OT Manual Therapy Units :   1 units   OT Manual Therapy Treatment Time :   15 minutes   OT Self Care ADL Training Units :   3 units   OT Self Care ADL Training Time :   45 minutes   OT Total Individual Therapy Time Rehab :   60    OT Total Timed Code Treatment Unit Rehab :   4    OT Total Timed Code  Treatment Minutes Rehab :   60    OT Total Treatment Time Rehab :   60 minutes   Raylene Miyamoto - 04/09/2021 13:54 EDT   Image 1 -  Images currently included in the form version of this document have not been included in the text rendition version of the form.   Safety   Safety Time Rehab 1 :   9:00 EST   Environmental Safety Implemented Rehab 1 :   Bed in low position, Bed alarm, Wheels locked, Call device within reach, Personal items within reach   Patient Position Rehab 1 :   Supine in bed   [place],  Mikal-OT - 04/09/2021 13:54 EDT

## 2021-04-09 NOTE — Telephone Encounter (Signed)
Pt had surgery on 10/24 and wife states he is still having a lot of pain.  Wife states [place] and he is having so much pain he cannot even sit to the side of the bed, unable to do any rehab due to this.  Wife would like a call back to discuss.  Please call wife at [phone].

## 2021-04-09 NOTE — Progress Notes (Signed)
OT Time Spent With Patient - Text       OT Time Spent With Patient Entered On:  04/09/2021 12:30 EDT    Performed On:  04/09/2021 12:26 EDT by Marisa Sprinkles, OT, MIRA E               Time Spent With Patient   OT Time In :   10:45 EST   OT Time Out :   11:00 EST   OT FUNCTIONAL TRNG 15 MIN :   1 units   OT Functional Activities Minutes :   15 minutes   OT Total Individual Therapy Time Rehab :   15    OT Treatment Time Comment :   OT c pt [id number]. Attempted raising HOB >~20* c supershort [place] J collar donned to trial if this would give pt support to tolerate more upright. Pt c increased pain as a result of elevating HOB, ice applied. Pt c sign pain, left c wife, PT present.   OT Total Timed Code Treatment Unit Rehab :   1    OT Total Timed Code Treatment Minutes Rehab :   15    OT Total Treatment Time Rehab :   15 minutes   KRAFT, OT, MIRA E - 04/09/2021 12:26 EDT   Image 1 -  Images currently included in the form version of this document have not been included in the text rendition version of the form.

## 2021-04-10 NOTE — Progress Notes (Signed)
OT Time Spent With Patient - Text       OT Time Spent With Patient Entered On:  04/10/2021 8:53 EDT    Performed On:  04/10/2021 8:52 EDT by Constance Goltz,  Mikal-OT               Time Spent With Patient   OT Time In :   8:30 EST   OT Time Out :   8:30 EST   OT Self Care ADL Training Units :   0 units   OT Self Care ADL Training Time :   0 minutes   OT Total Individual Therapy Time Rehab :   0    OT Treatment Time Comment :   Pt refusing therapy due to elevated neck/shoulder pain, med hold proposed.   OT Total Timed Code Treatment Unit Rehab :   0    OT Total Timed Code Treatment Minutes Rehab :   0    OT Total Treatment Time Rehab :   0 minutes   Raylene Miyamoto - 04/10/2021 8:52 EDT   OT Minutes Cancelled Missed Grid     OT Minutes Lost #1          Amount :    90               Reason :    Pain                Raylene Miyamoto - 04/10/2021 8:52 EDT         Image 1 -  Images currently included in the form version of this document have not been included in the text rendition version of the form.

## 2021-04-10 NOTE — Progress Notes (Signed)
Interdisciplinary Team Conference - Text       Interdisciplinary Team Conference PF Entered On:  04/10/2021 6:13 EDT    Performed On:  04/10/2021 6:10 EDT by Laurena Bering D-RN               Team Members   Care Manager :   Melonie Florida   Nurse :   Judie Grieve   Occupational Therapist :   Raylene Miyamoto   Physical Therapist :   Aida Raider   Rehab Physician :   Alcide Clever M-MD   Team Conference Date :   04/11/2021 EDT   Richardo Hanks T - 04/11/2021 11:55 EDT   Present at Conference Psychologist :   Payton Emerald D-PhD   Payton Emerald D-PhD - 04/11/2021 10:33 EDT   Primary Care Manager :   Sande Rives S - 04/10/2021 13:36 EDT   Care Management Summary.   Type of Conference :   Initial   Discharge Plan :   home with wife   Support Network :   family and friens   Patient, Family Focus Care :   get this pain under control   Adjustment to Disability :   fair   Understanding of Disability :   fair   Melonie Florida - 04/10/2021 13:36 EDT   Nursing Summary   Bowel Management :   Continent   Bowel Program :   Stool softener   Bowel Movement Last Date :   04/09/2021 EDT   Tracheostomy Information :   No qualifying data available.     Bladder Management :   Continent   Urinary Elimination Management :   Voiding, no difficulties   Progress Achieved This Week :   Pt alert & oriented x4. Per report LBM 11/1. Pt cont x2. Pt complains of right shoulder pain often and requests PRN oxy Q4H. Incision has stitches OTA. Pt has concerns about pain/discomfort.   Nursing Team Notes Current :   Yes   Laurena Bering D-RN - 04/10/2021 6:10 EDT   OT Basic ADL   OT Basic ADL 2019   Eating :   Setup or clean-up assistance - Helper sets up or cleans up; Patient/Resident completes activity. Helper assists only prior to or following the activity. - 05   Oral Hygiene :   Setup or clean-up assistance - Helper sets up or cleans up; Patient/Resident completes activity. Helper  assists only prior to or following the activity. - 05   Toileting Hygiene :   Partial/Moderate assistance - Helper does LESS THAN HALF the effort. Helper lifts, holds or supports trunk or limbs, but provides less than half the effort. - 03   Toilet Transfer :   Partial/Moderate assistance - Helper does LESS THAN HALF the effort. Helper lifts, holds or supports trunk or limbs, but provides less than half the effort. - 03   Shower, Bathe Self :   Partial/Moderate assistance - Helper does LESS THAN HALF the effort. Helper lifts, holds or supports trunk or limbs, but provides less than half the effort. - 03   Upper Body Dressing :   Supervision or touching assistance - Helper provides verbal cues and/or touching/steadying and/or contact guard assistance as patient/resident completes activity. Assistance may be provided throughout the activity or intermittently. - 04   Lower Body Dressing :   Supervision or touching assistance -  Helper provides verbal cues and/or touching/steadying and/or contact guard assistance as patient/resident completes activity. Assistance may be provided throughout the activity or intermittently. - 04   Putting On, Taking Off Footwear :   Setup or clean-up assistance - Helper sets up or cleans up; Patient/Resident completes activity. Helper assists only prior to or following the activity. - 05   Grooming :   Setup or clean-up assistance - Helper sets up or cleans up; Patient/Resident completes activity. Helper assists only prior to or following the activity. - 05   Tub Transfer :   Not attempted due to medical condition or safety concerns - 42   Shower Transfer :   Not attempted due to medical condition or safety concerns - 87   Constance Goltz,  Mikal-OT - 04/10/2021 8:54 EDT   ADL Comments :   ADL eval 95/28  All scores reflect post intervention    Eating: setup supine c HOB elevated  Oral hygiene: setup supine c HOB elevated  Toilet hygiene: not observed, anticipate min A rolling  Toilet t/f: not  observed, anticipate min A rolling on/off bedpan  Bathe: supine c HOB elevated, min A for back  UB dressing: touch A to manage around head, seated EOB  LB dressing: setup rolling L<>R supine  Grooming: setup supine c HOB elevated     OT ADL Reviewed [name] - 04/10/2021 8:54 EDT   Cognition   Cognition :   Within functional limits   Cognition Reviewed Dossie Arbour,  Mikal-OT - 04/10/2021 8:54 EDT   OT Short Term Goals.   Bathing Goal Grid     Goal #1          Activity :    Shower transfer              Assist :    Minimal assistance              Equipment :    Tub bench              Status :    Initial                Raylene Miyamoto - 04/10/2021 8:54 EDT         Toileting and Transfers Goal Grid     Goal #1          Activity :    Toilet transfers              Assist :    Minimal assistance              Transfer Equipment :    Drop arm commode              Status :    Initial                Raylene Miyamoto - 04/10/2021 8:54 EDT         OT Balance Goal Grid     Goal #1          Descriptors :    Two upper extremities support              Type :    Static standing              Assist :    Contact guard assistance              Length of Time (minutes) :  2 minutes              Rationale :    Improve independence with activities of daily living              Status :    Initial                Raylene Miyamoto - 04/10/2021 8:54 EDT         OT STG Reviewed Dossie Arbour,  Mikal-OT - 04/10/2021 8:54 EDT   OT Long Term Goals   Patient/Caregiver Goals :   Pt would like to restore PLOF   Raylene Miyamoto - 04/10/2021 8:54 EDT   OT IP Long Term Goals Grid     Long Term Goal 1          Goal :    Pt will be able to stand with no UE support in order to participate in IADL's                Raylene Miyamoto - 04/10/2021 8:54 EDT         OT Inpatient Long Term Goals Team 2019   Eating Goal :   Supervision or touching assistance - Helper provides verbal cues and/or touching/steadying and/or contact guard  assistance as patient/resident completes activity. Assistance may be provided throughout the activity or intermittently. - 04   Oral Hygiene Goal :   Supervision or touching assistance - Helper provides verbal cues and/or touching/steadying and/or contact guard assistance as patient/resident completes activity. Assistance may be provided throughout the activity or intermittently. - 04   Toileting Hygiene Goal :   Supervision or touching assistance - Helper provides verbal cues and/or touching/steadying and/or contact guard assistance as patient/resident completes activity. Assistance may be provided throughout the activity or intermittently. - 04   Shower, Bathe Self Goal :   Supervision or touching assistance - Helper provides verbal cues and/or touching/steadying and/or contact guard assistance as patient/resident completes activity. Assistance may be provided throughout the activity or intermittently. - 04   Upper Body Dressing Goal :   Supervision or touching assistance - Helper provides verbal cues and/or touching/steadying and/or contact guard assistance as patient/resident completes activity. Assistance may be provided throughout the activity or intermittently. - 04   Lower Body Dressing Goal :   Supervision or touching assistance - Helper provides verbal cues and/or touching/steadying and/or contact guard assistance as patient/resident completes activity. Assistance may be provided throughout the activity or intermittently. - 04   Toilet Transfer Goal :   Supervision or touching assistance - Helper provides verbal cues and/or touching/steadying and/or contact guard assistance as patient/resident completes activity. Assistance may be provided throughout the activity or intermittently. - 82   Raylene Miyamoto - 04/10/2021 8:54 EDT   OT Long Term Goals Reviewed :   Dossie Arbour,  Mikal-OT - 04/10/2021 8:54 EDT   Occupational Therapy Summary.   Barriers to Safe Discharge OT :   Medical diagnosis, Past medical  history   Additional Comments OT DME Rehab Summary :   Pt unable to participate in functional ex past few days, manual therapy and bedside/supine ADL's performed as tolerated.    Barriers: R sided neck pain    LTG: Modified for independent to supv; if pain were reduced- anticipate pt would function at supv level    DME: ongoing assessment             OT Team  Notes Current :   Yes   Raylene Miyamoto - 04/10/2021 8:54 EDT   PT Mobility   PT Mobility 2019   Roll Left :   Supervision or touching assistance - Helper provides verbal cues and/or touching/steadying and/or contact guard assistance as patient/resident completes activity. Assistance may be provided throughout the activity or intermittently. - 04   Roll Right :   Not attempted due to medical condition or safety concerns - 88   Roll Left and Right :   Not attempted due to medical condition or safety concerns - 88   Roll Prone :   Not attempted due to medical condition or safety concerns - 88   Roll Supine :   Not attempted due to medical condition or safety concerns - 88   Sit to Lying :   Substantial/Maximal assistance - Helper does MORE THAN HALF the effort. Helper lifts or holds trunk or limbs and provides more than half the effort. - 02   Lying to Sitting on Side of Bed :   Substantial/Maximal assistance - Helper does MORE THAN HALF the effort. Helper lifts or holds trunk or limbs and provides more than half the effort. - 02   Scooting :   Dependent - Helper does ALL of the effort. Patient/Resident does none of the effort to complete the activity or the assistance of 2 or more helpers is required for the patient/resident to complete the activity. - 01   Sit to Stand :   Not attempted due to medical condition or safety concerns - 102   Stand to Sit :   Not attempted due to medical condition or safety concerns - 40   Chair, Bed to Chair Transfer :   Not attempted due to medical condition or safety concerns - 18   Car Transfer :   Not attempted due to medical  condition or safety concerns - [address]   Sid Falcon A-PT - 04/11/2021 7:48 EDT   Independent - Patient/Resident completes the activities by him/herself, with or without an assistive device, with no assistance from a helper. - 06 :   Mobility grid reflect functional mobility with use of hospital bed rails and elevating HOB   Sid Falcon A-PT - 04/11/2021 7:48 EDT   PT Ambulation 2019   Walk 10 Feet :   Not attempted due to medical condition or safety concerns - 88   Walk 50 Feet with Two Turns :   Not attempted due to medical condition or safety concerns - 88   Walk 150 Feet :   Not attempted due to medical condition or safety concerns - 88   Walking 10 Feet Uneven Surfaces :   Not attempted due to medical condition or safety concerns - 88   1 Step (Curb) :   Not attempted due to medical condition or safety concerns - 88   Ramp Ambulation :   Not attempted due to medical condition or safety concerns - 88   4 Steps :   Not attempted due to medical condition or safety concerns - 88   12 Steps :   Not attempted due to medical condition or safety concerns - 88   Picking Up Object :   Not attempted due to medical condition or safety concerns - [address]   Sid Falcon A-PT - 04/11/2021 7:48 EDT   Transfer Type :   Stand step   Ambulation Device Utilized :   Rolling walker   Distance Level  Surface :   3 ft   PT Mobility Reviewed :   Yes   Sid Falcon A-PT - 04/11/2021 7:48 EDT   WC Management   Wheelchair Details :   pt req inc time and demoing small arc propulsion technique for propelling MWC 150' c BUEs despite cues for propulsion technique and efficiency     Sid Falcon A-PT - 04/11/2021 7:48 EDT   Wheelchair Mobility   Wheel 50 Feet with Two Turns :   Supervision or touching assistance - Helper provides verbal cues and/or touching/steadying and/or contact guard assistance as patient/resident completes activity. Assistance may be provided throughout the activity or intermittently. - 04   Wheel 150 Feet :   Supervision or touching  assistance - Helper provides verbal cues and/or touching/steadying and/or contact guard assistance as patient/resident completes activity. Assistance may be provided throughout the activity or intermittently. - 04   Sid Falcon A-PT - 04/11/2021 7:48 EDT   PT Wheelchair Mobilitiy Reviewed Rehab :   Yes   Sid Falcon A-PT - 04/11/2021 7:48 EDT   PT Short Term Goals.   Bed Mobility Goal Grid     Goal #1          Descriptors :    Sit to supine, Supine to sit              Level :    Close supervision              Status :    Progressing, continue                Sid Falcon A-PT - 04/11/2021 7:48 EDT         Transfers Goal Grid     Goal #1  Goal #2        Descriptors :    Sit to stand, Stand to sit   Stand step           Level :    Moderate assistance   Moderate assistance           Device :    Rolling walker   Rolling walker           Status :    Initial   Initial             Sid Falcon A-PT - 04/11/2021 7:48 EDT  Sid Falcon A-PT - 04/11/2021 7:48 EDT        Ambulation Goal Grid     Goal #1          Descriptors :    Level surfaces              Level :    Moderate assistance              Distance/# of Steps :    15              Device :    Rolling walker              Status :    Initial                Sid Falcon A-PT - 04/11/2021 7:48 EDT         PT Balance Goal Grid     Goal #1          Descriptor :    Supported stand  Assist Level :    Close supervision              Type :    Static standing              Devices :    Rolling walker              Length of Time (minutes) :    3 minutes              Rationale :    Improve independence with activities of daily living              Status :    Initial                Sid Falcon A-PT - 04/11/2021 7:48 EDT         PT STG Reviewed :   Yes   Sid Falcon A-PT - 04/11/2021 7:48 EDT   PT Long Term Goals   Outpatient PT Long Term Goals Rehab     Long Term Goal 1          Goal :    pt will complete TUG              Status :    Initial                Sid Falcon A-PT -  04/11/2021 7:48 EDT         Mobility Goal 2019   Roll Left and Right Goal :   Independent - Patient/Resident completes the activities by him/herself, with or without an assistive device, with no assistance from a helper. - 06   Sit to Lying Goal :   Independent - Patient/Resident completes the activities by him/herself, with or without an assistive device, with no assistance from a helper. - 06   Lying to Sitting Side of Bed Goal :   Independent - Patient/Resident completes the activities by him/herself, with or without an assistive device, with no assistance from a helper. - 06   Sit to Stand Goal :   Independent - Patient/Resident completes the activities by him/herself, with or without an assistive device, with no assistance from a helper. - 06   Chair,Bed to Chair Transfer Goal :   Independent - Patient/Resident completes the activities by him/herself, with or without an assistive device, with no assistance from a helper. - 06   Car Transfer Goal :   Independent - Patient/Resident completes the activities by him/herself, with or without an assistive device, with no assistance from a helper. - 06   Sid Falcon A-PT - 04/11/2021 7:48 EDT   Walk Goals 2019   Walk 10 Feet Goal :   Independent - Patient/Resident completes the activities by him/herself, with or without an assistive device, with no assistance from a helper. - 06   Walk 50 Feet with Two Turns Goal :   Independent - Patient/Resident completes the activities by him/herself, with or without an assistive device, with no assistance from a helper. - 06   Walk 150 Feet Goal :   Independent - Patient/Resident completes the activities by him/herself, with or without an assistive device, with no assistance from a helper. - 06   Sid Falcon A-PT - 04/11/2021 7:48 EDT   Wheelchair Goals 2019   Wheel 50 Feet with Two Turns Goal :   Independent - Patient/Resident completes the activities by him/herself, with or without an assistive device, with  no assistance from a  helper. - 06   Wheel 150 feet Goal :   Independent - Patient/Resident completes the activities by him/herself, with or without an assistive device, with no assistance from a helper. - 06   Sid Falcon A-PT - 04/11/2021 7:48 EDT   PT LTG Reconcilation :   stair LTGs pending further assessment/progression   Type of Wheelchair Goal :   Manual wheelchair   PT LT Goals Reviewed :   Yes   Sid Falcon A-PT - 04/11/2021 7:48 EDT   Physical Therapy Summary.   Additional Comments PT DME Rehab Summary :   LTGs set to be independent however, pt has not been able to participate in skilled PT intervention due to pain. Recommend transfer to subacute rehab.     Barriers: Pain, poor tolerance to activity, poor tolerance to short sit.      PT Team Notes Current :   Yes   Sid Falcon A-PT - 04/11/2021 7:48 EDT   SLP Short Term Goals.   SLP STG Reviewed Eveline Keto - 04/11/2021 11:55 EDT   Speech Therapy Summary.   SLP Progress Note Current :   N/A   Aida Raider - 04/11/2021 11:55 EDT   Psychology Summary.   Psychology Summary :   Pain management without drugs was discussed in depth, and both he and his wife are in favor of using this approach. He noted that he has had several spinal surgeries but that this most recent one has resulted in the most continuous level of pain. Despite the pain, his cognitive functioning has not been hindered, although his attention can be distracted due to pain. His appetite is adequate. However, his sleep has been greatly dysfunctional due to waking up with pain consistently.    Emotionally, patient shows mild to moderate symptoms of anxiety and depression. However, frustration is the biggest barrier currently. We will begin guided imagery treatment next session. Assessment/Plan 1. Cervical myelopathy 2. 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   Medical/rehab reason for continued stay :   Community mobility issue, Home mobility issue, Medication adjustments, Pain management, Pending labs/diagnostics, Other: Plan for transfer back to acute care today    Alcide Clever M-MD - 04/11/2021 19:40 EDT   Rehab Anticipated Discharge Date :   04/12/2021 EDT   Team Conference Discussion RTF :   scribe: GMurray, pT     Barriers to goals/Reasons for skilled intervention :   Decreased endurance, Decreased sitting/standing balance, Pain management issues   Aida Raider - 04/11/2021 11:55 EDT   Anticipated Therapy Interventions.   PT Estimated Hours per Week :   7.5 hours per week   Therapy Activity Tolerance :   15 hours every 7 days   OT Estimated Hours Per Week :   7.5 hours per week   SLP Estimated Hours per Week :   SLP not following   MURRAY, PT, GAIL T - 04/11/2021 11:55 EDT

## 2021-04-10 NOTE — Telephone Encounter (Signed)
I called and spoke with Mrs. Branch and updated her on the current plan.  I also discussed the most recent MRI results and the plan for ultrasound-guided aspiration of the posterior cervical fluid collection.

## 2021-04-11 ENCOUNTER — Telehealth

## 2021-04-11 DIAGNOSIS — G9763 Postprocedural seroma of a nervous system organ or structure following a nervous system procedure: Secondary | ICD-10-CM

## 2021-04-11 LAB — CULTURE, BODY FLUID: Gram Stain Result: NONE SEEN

## 2021-04-11 NOTE — Case Communication (Signed)
Care Management Discharge Plan - Text       Care Management Discharge Plan Entered On:  04/11/2021 15:55 EDT    Performed On:  04/11/2021 15:55 EDT by Gasper Lloyd S               Care Management Discharge Plan   CM Barriers to Safe Discharge :   Complicated medical history   Jesse Savage - 04/11/2021 15:55 EDT   Discharge IRF-PAI   Discharge to Living Setting :   [facility] term general hospital   IRF-PAI Program Interruptions :   No   Jesse Savage - 04/11/2021 15:55 EDT

## 2021-04-11 NOTE — Case Communication (Signed)
CM Discharge Planning Assessment - Text       CM Discharge Planning Ongoing Assessment Entered On:  04/11/2021 16:38 EDT    Performed On:  04/11/2021 16:34 EDT by Gasper Lloyd S               Discharge Needs I   Previously Documented Discharge Needs :   DISCHARGE PLAN/NEEDS:  Anticipated Discharge Date: 04/11/2021 - Melonie Florida - 04/11/21 13:38:00  EQUIPMENT/TREATMENT NEEDS:  Needs Assistance at Home Upon Discharge: Yes Melonie Florida - 04/11/21 13:38:00       Previously Documented Benefits Information :   Performed By: Caryn Bee  - 04/04/21 08:16:00       Anticipated Discharge Date :   04/11/2021 EDT   Anticipated Discharge Time Slot :   1600-1800   Discharge To :   Family support, Other: WILL TRANSFER BACK TO A/C   CM Progress Note :   04/05/2021: MEDICAL RECORDS REVIEWED. PATIENT ADMITTED FROM [facility]. PATIENT LIVES WITH HIS WIFE Jesse Savage [[id number]] IN A 1 LEVEL HOME WITH 3 STEPS TO ENTER. PATIENT KNOWN TO [facility] FROM PREVIOUS ADMISSION. PATIENT WENT TO OUTPATIENT [facility] AT [facility] AFTER DISCHARGE FROM [facility]. MD WILL DO IPOC. 1st TC WILL BE THURSDAY 11/3. MSW WILL FOLLOW FOR D/C PLANNING AND CASE MANAGEMENT.    04/11/2021: TC TODAY. PER [facility] MD DONOVAN, PATIENT WILL DISCHARGE TO A/C TODAY OR TOMORROW UNDER NEUROSURGEY SERVICES AT [facility] AT [facility]. TRANSPORT WILL NEED TO BE SETUP IF PATIENT TRANSFERS TO [facility]. MRI ORDERED. RESULTS WILL DETERMINE WHICH SERVICE PATIENT WILL TRANSFER TO. PAIN HAS REMAINED A BARRIER TO PATIENT'S FULL PARTICIPATION IN REHAB REGIME. MSW CONTINUES TO FOLLOW AND WILL FINALIZE DISCHARGE PLAN.  [facility] MD DONOVAN HAS DISCUSSED WITH PATIENT AND WIFE.  ADDENDUM: MSW NOTIFIED BY [facility] MD DONOVAN THAT PATIENT WILL TRANSFER BACK TO [facility] FOR SURGERY. PATIENT STATES HIS RELUCTANCE TO TRANSFER BACK TO [facility] BUT HAS SPOKEN WITH DR. DONOVAN AND IS IN AGREEMENT WITH TRANSFER.     Melonie Florida - 04/11/2021 16:34 EDT   Discharge Needs II   Community Resources and Special Services :    Discharge transportation   Needs Assistance with Transportation :   Yes   Discharge Transportation Arranged :   Yes   Needs Assistance at Home Upon Discharge :   Yes   Requires Caregiver Involvement :   Yes   Discharge Planning Time Spent :   20 minutes   Melonie Florida - 04/11/2021 16:34 EDT   Discharge Planning   Discharge Arrangements :       Patient Post-Acute Information    Patient Name: Jesse Savage, Jesse Savage  MRN: [id number]  FIN: [id number]  Gender: Male  DOB: [birth year]  Age:  60 Years        *** No Post-Acute Placement(s) Listed ***          *** No Post-Acute Service(s) Listed Nurse, mental health Hospital Services :   ACUTE CARE TRANSFER TO [facility]     Interventions Performed :   Home Environment Barriers Addressed   Discharge Plan Discussion :   Discussed with patient   Melonie Florida - 04/11/2021 16:34 EDT   Discharge Planning Assessment Status   Discharge Planning Assessment Complete :   Yes   Melonie Florida - 04/11/2021 16:34 EDT

## 2021-04-11 NOTE — Telephone Encounter (Signed)
[place]: NEUROSURGERY  Fax [phone]      Patient Name: Jesse Savage   DOB: March 24, 1961   Age: 60 y.o.   Sex: male  Patient ID: _[id number] @   Preferred Phone: [phone]     ______________________________________________________________________    Surgery:  Date: 04/12/21 Time:  0800  Duration: 1.5 hours  Facility: [facility]    Admitting Diagnosis: post operative seroma    Diagnosis Codes: L76.34  Film Location:  PACS    Status: _1   IP      _2  OP     _3  Same Day Discharge is Planned           _4  Elective  _5  Urgent               Perioperative Clinic Visit Needed: _6  Yes _7  No    Procedure Codes:  _8  20552 _9  22558* _10  22830* _11  22856 _12  29562 _13  13086*  _14  20610 _15  22585 _16  22840 _17  57846 _18  62223* _19  96295*  _20  20930 _21  22600* _22  22842 _23  22899 _24  62252 _25  63267  _26  20931 _27  22610* _28  22843* _29  61154* _30  63012 _31  63276*  _32  20936 _33  22612 _34  22845 _35  61312* _36  63030 _37  63655  _38  22513 _39  22614 _40  22846* _41  61343* _42  63042 _43  28413  _44  22514 _45  22630 _46  22849* _47  61510* _48  63045 _49  24401  _50  22551 _51  02725* _52  22852* _53  36644* _54  63047 _55  03474  _56  22552 _57  25956 _58  38756 _59  43329* _60  51884 _61  16606  _62  22554 _63  30160 _64  10932 _65  35573 _66  63056 _67  22025      _68  42706  10140      *Medicare IP only Code    _69  ERAS      Operative Consent for: I&D of post operative seroma    MD Performing:  Marlou Porch   MD NPI Number:   Assistant: Rosana Hoes   Assistant NPI Number:        Allergies:   Allergies   Allergen Reactions    Codeine      Other reaction(s): itch  Other reaction(s): NA    Dilaudid [Hydromorphone Hcl]      Other reaction(s): malaise    Duloxetine Hcl      Other reaction(s): hypertension  Other reaction(s): NA    Gabapentin      Other reaction(s): sleepy  Other reaction(s): dizzy  Other reaction(s): NA    Hydrocodone      Other reaction(s): itch    Pregabalin      Other reaction(s): neurotic episodes    Tramadol Hcl      Other reaction(s): malaise      ______________________________________________________________________    Physician Name:     Primary Insurance Carrier: _70 @ Member Number: _71 @    Member Name:  Member DOB:     Secondary Insurance Carrier:  Member Number:     Member Name:  Member DOB:   ______________________________________________________________________    Anesthesia Type: _72  General _73  MAC _74  Neuroaxial  _75  Local Only    Height:   Weight: _76 @   ______________________________________________________________________                  Clute:  _77 Atlantis   _78  Legacy   _79  Divergence   _80  Wave   _81  Sextant _82  Solera       _83  Elevate   _84   Spine   _85 Infinity   _86  ILOF/DLIF   _87  Zevo   _88   Voyager       _0  Titan   _1  Titan C  _2  AESCULAP ABC       _3  SPINEOLOGY Elite   20930= What type of graft material:    Other:        Equipment: _4 C-Arm _5  O-Arm _6 Siemans C-Arm _7  (MIS only)   _8  (OPEN/MIS combo)   _9  Microscope  _10  Cellsaver  _11  Nerve Monitoring   _12  IGS Neuro Navigation  _13  Other:    Headrest: _14  3 Point   _15  Horseshoe  _16  Other:   Table: _17  Jackson   _18  Trumpf   _19  Radiolucent  _20  Other:     Removal of Hardware:     Patient Positioning: _21  Supine  _22  Prone _23  Lateral Right Side Up   _24  Lateral Left Side Up  _25  Beanbag   Foley: _26  Cath  _27  Other:     MD Position: _28  On Patient's Right _29  On Patient's Left  _30  Other:     DME: _31 [facility] DME  _32 External DME   _33 [place] Overlap Brace  _34 TLSO L0456     _35 TLSO [id number]     _36 LSO Custom Brace [id number]   _37 [place] J G6440   _38 [place] Multi Post Collar L0180   _39 Bone Growth Stimulator-Spinal H4742    _40 LSO Custom [id number]   <VZDGLOVFIEPPIRJJ>_8<\/ACZYSAYTKZSWFUXN>_23 Other:    I certify that this prescribed equipment, its setup and any related patient education is medically indicated & both reasonable and necessary to accepted standards of medicine of the pt's condition. I certify that the information provided is true, accurate and complete to the best of my knowledge. I understand that any falsification, omission or  concealment of material fact associated with billing this service may subject me to civil or criminal liability.   ______________________________________________________________________      Kyla Balzarine   Equipment: _42 Selector  _43 STEALTH _44 Skull Clamps _45 Navigation _46 Microscope   _47 Cellsaver  _48 Subdural Drain _49 Biopsy _50 FS/permanent _51 Ultrasound   _52 Jelly doughnut _53 Other:   Headrest: _54 Sugita _55 3 Point Pins _56 Horseshoe _57 Other:  Table:   Shunts: _58 Codman Shunt  _59 Medtronic  _60 Foley Cath  _61 Other:     Patient Positioning: _62 Supine _63 Lateral Right Side Up _64 Lateral Left Side Up   _65 Beanbag _66 Prone _67 Other:     MD Position: _68 On Patient's Right  _69 On Patient's Left  _70 Other:     Physician Signature:  Newman Nip, APRN - NP         Date:  04/11/21  Time: 1:00 PM      ______________________________________________________________________    Patient Name: Jesse Savage   DOB: 1960/07/16   Age: 60 y.o.   Sex: male  Patient ID: [id number]  Preferred Phone: [phone]   SSN: _71 @

## 2021-04-11 NOTE — Progress Notes (Signed)
 PT Inpatient Daily Documentation - Text       PT Inpatient Daily Documentation Entered On:  04/11/2021 9:13 EDT    Performed On:  04/11/2021 8:30 EDT by Jesse Savage               Reason for Treatment   Subjective Statement :   Pt agreeable to PT session, however continues to report significant neck pain w/ mvmnt/fnct mobility limiting his tx session.     *Reason for Referral :   Dx: 60 yo s/p C5-6 bilateral laminectomy for decompression of spinal cord per Dr. 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  Precautions: Cervical spine prec, soft c-collar PRN for pain, PAIN LIMITING FNX  PMHx: Prior ACDF,  Activity: 15 hr  TC date: Thursday Jesse Savage Jesse Savage       *Chief Complaint :   pain RUE/scapula/shoulder/neck     Jesse Savage - 04/11/2021 9:02 EDT   Review/Treatments Provided   PT Goals :   PT Short Term Goals    04/10/2021  Bed Mobility Goal #1: Sit to supine, Supine to sit; Close S; Progressing, continue  Transfer Goal #1: Sit to stand, Stand to sit; Mod A; Rolling walker; Initial  Transfer Goal #2: Stand step; Mod A; Rolling walker; Initial  Ambulation Goal #1: Level surfaces; Mod A; 15; Rolling walker; Initial  Balance Goal #1: Supported stand; Close S; Static standing; Rolling walker; 3; Improve independence with activities of daily living; Initial     PT Plan :   Treatment Frequency:  Daily (modified)   Performed By: Kathreen Nixon M-PT  04/05/2021 08:41  Treatment Duration: 2 Performed By: Kathreen Nixon M-PT  04/05/2021 08:41  Planned Treatments: Balance training, Basic activities of daily living, Bed mobility training, Caregiver training, Community/Work reintegration, Equipment training, Functional training, Gait training, Group therapy, Manual therapy, Moist heat/ice, Neuromuscular reeducatio... Performed By: Kathreen Nixon M-PT  04/05/2021 08:41     Short Term Goals Reviewed :   Yes   Physical Therapy Orders :   Physical Therapy Medical Hold - 04/10/21 9:36:00 EDT, Stop date  04/10/21 9:36:00 EDT, Cervical myelopathy  Hypertension  Physical Therapy Medical Hold - 04/09/21 15:25:00 EDT, Stop date 04/09/21 15:25:00 EDT, Cervical myelopathy  Physical Therapy Inpatient Additional Treatment Rehab - 04/05/21 12:36:10 EDT, Balance training, Basic activities of daily living, Bed mobility training, Caregiver training, Community/Work reintegration, Equipment training, Functional training, Gait training, Group therapy, Manual therapy, Moist heat/ice,...     Pain Present :   Yes actual or suspected pain   PT Therapeutic Activity,Mobility,Balance :   Yes   PT Therapeutic Exercise :   Yes   Jesse Savage - 04/11/2021 9:02 EDT   Pain Assessment   Time Pattern :   Constant   Jesse Savage,  Jesse Savage - 04/11/2021 9:30 EDT   Pain Present :   Yes actual or suspected pain   Pain Present :   Neck   Self Report Pain :   Numeric rating scale   Jesse Savage - 04/11/2021 9:02 EDT   Therapeutic Activities/Mobility/Balance   Functional Activity :   Therapeutic Activities  Activity 1:  Bed mobility; Close S; Other: supine-> Savage sidelying partial roll for pain management; Other: bedrail; Decreased pain       Performed Date:  04/08/2021     Jesse Savage - 04/11/2021 9:02 EDT   Jesse Savage - 04/11/2021 9:02 EDT  PT Therapeutic Activities Grid     Activity 1  Activity 2        Activity :    Bed mobility   Bed mobility           Comment :    Pt perf sit<>supine req CGA/MinA for safety/guarding/assist d/t pain t/o mvmnt. Pt able to obtain a sitting position at EOB, however was unable to maintain for more than 5 seconds d/t significant pain t/o neck/shld region.   Pt demonstrating independence w/ Savage<>R rolling in hospital bed w/ minor use of bed rail while therapist assisting pt w/ donning UB clothing.              Jesse Savage - 04/11/2021 9:30 EDT  Jesse Savage - 04/11/2021 9:30 EDT        Reassess Mobility :   Yes   Jesse Savage Jesse Savage 04/11/2021 9:02 EDT   PT Mobility   PT Mobility Reviewed :    Jesse Savage - 04/11/2021 9:30 EDT   PT Mobility 2019   Floor Recovery :   Not attempted due to medical condition or safety concerns - 34   Jesse Savage,  Jesse Savage - 04/11/2021 9:30 EDT   Roll Prone :   Not attempted due to medical condition or safety concerns - 88   Sit to Stand :   Not attempted due to medical condition or safety concerns - 88   Stand to Sit :   Not attempted due to medical condition or safety concerns - 34   Chair, Bed to Chair Transfer :   Not attempted due to medical condition or safety concerns - 81   Car Transfer :   Not attempted due to medical condition or safety concerns - 76   Jesse Savage,  Jesse Savage - 04/11/2021 9:02 EDT   Roll Left :   Independent - Patient/Resident completes the activities by him/herself, with or without an assistive device, with no assistance from a helper. - 06   Roll Right :   Independent - Patient/Resident completes the activities by him/herself, with or without an assistive device, with no assistance from a helper. - 06   Roll Left and Right :   Independent - Patient/Resident completes the activities by him/herself, with or without an assistive device, with no assistance from a helper. - 06   Roll Supine :   Supervision or touching assistance - Helper provides verbal cues and/or touching/steadying and/or contact guard assistance as patient/resident completes activity. Assistance may be provided throughout the activity or intermittently. - 04   Sit to Lying :   Partial/Moderate assistance - Helper does LESS THAN HALF the effort. Helper lifts, holds or supports trunk or limbs, but provides less than half the effort. - 03   Lying to Sitting on Side of Bed :   Partial/Moderate assistance - Helper does LESS THAN HALF the effort. Helper lifts, holds or supports trunk or limbs, but provides less than half the effort. - 03   Scooting :   Not attempted due to medical condition or safety concerns - 66   Jesse Savage,  Jesse Savage - 04/11/2021 9:30 EDT     Jesse Savage -  04/11/2021 9:30 EDT   Jesse Savage - 04/11/2021 9:30 EDT   PT Ambulation 2019   Walk 10 Feet :   Not attempted due to medical condition or safety concerns - 88   Walk 50  Feet with Two Turns :   Not attempted due to medical condition or safety concerns - 88   Walk 150 Feet :   Not attempted due to medical condition or safety concerns - 88   Walking 10 Feet Uneven Surfaces :   Not attempted due to medical condition or safety concerns - 88   1 Step (Curb) :   Not attempted due to medical condition or safety concerns - 88   Ramp Ambulation :   Not attempted due to medical condition or safety concerns - 88   4 Steps :   Not attempted due to medical condition or safety concerns - 88   12 Steps :   Not attempted due to medical condition or safety concerns - 88   Picking Up Object :   Not attempted due to medical condition or safety concerns - 26   Jesse Savage,  Jesse Savage - 04/11/2021 9:02 EDT         Jesse Savage - 04/11/2021 9:30 EDT   Therapeutic Exercise   Therapeutic Exercise RTF :   Therapeutic Exercise  Exercise 1:  Lower extremity active range, Lower extremity strengthening; Supine; 10-15reps x 3-4 sets each LE; straight leg raises, hip abduction slides, posterior pelvic tilts in hooklying/head of bed flat, hip flexion and heel slides. rests between sets as needed.       Performed Date:  04/08/2021     Jesse Savage - 04/11/2021 9:02 EDT   Jesse Savage - 04/11/2021 9:02 EDT   Therapeutic Exercise Grid     Exercise 1          Exercise :    Lower extremity strengthening              Repetition/Time :    3x10 ea              Comment :    Pt perf supine hip abd/add & heel slides w/ 3# ankle wts donned B (3x10ea) & hip add w/ ball squeeze (2x10ea) & hip abd w/ grn tband in bridge/knee bent position (x10 to pain tol); ex's perf to assist in maintaining/inc pt's strength within pt's pain tol.                Jesse Savage - 04/11/2021 9:30 EDT         Short Term Goals   Bed Mobility Goal Grid     Goal #1           Descriptors :    Sit to supine, Supine to sit              Level :    Close supervision              Status :    Progressing, continue                Jesse Savage - 04/11/2021 9:02 EDT         Transfers Goal Grid     Goal #1  Goal #2        Descriptors :    Sit to stand, Stand to sit   Stand step           Level :    Moderate assistance   Moderate assistance           Device :    Rolling walker   Rolling walker  Status :    Initial   Initial             Jesse Savage - 04/11/2021 9:02 EDT  Jesse Savage - 04/11/2021 9:02 EDT        Ambulation Goal Grid     Goal #1          Descriptors :    Level surfaces              Level :    Moderate assistance              Distance/# of Steps :    15              Device :    Rolling walker              Status :    Initial                Jesse Savage - 04/11/2021 9:02 EDT         PT Balance Goal Grid     Goal #1          Descriptor :    Supported stand              Assist Level :    Close supervision              Type :    Static standing              Devices :    Rolling walker              Length of Time (minutes) :    3 minutes              Rationale :    Improve independence with activities of daily living              Status :    Initial                Jesse Savage - 04/11/2021 9:02 EDT         PT ST Goals Reviewed :   Jesse Savage - 04/11/2021 9:02 EDT   Assessment   PT Impairments or Limitations :   Abnormal tone, Ambulation deficits, Balance deficits, Bed mobility deficits, Endurance deficits, Equipment training, Pain limiting function, Strength deficits, Transfer deficits, Transition deficits, Wheelchair mobility deficits   Barriers to Safe Discharge PT :   Medical diagnosis, Past medical history, Severity of deficits   Discharge Recommendations :   d/c home c spouse    OPPT vs HH    DME: ?          Jesse Savage - 04/11/2021 9:02 EDT   PT Treatment Recommendations :   Pt agreeable to PT session, however continues to  report significant neck pain w/ mvmnt/any attempts at performing fnct mobility activities limiting his tx session. He was agreeable to performing LB strengthening exercises to assist in maintaining/improving his LB strength while only in a supine position. He was able to complete some exercises w/ min inc in pain, however was limited w/ repetitions d/t pain w/ hip add/add while in a bridge/knee bent position causing inc pain despite max cues to engage core as well as mult positional changes w/ HOB. Although pt remains motivated to make progress, he remains functionally limited d/t the severity of his pain. Pt's MD made aware of pt's  continued limitations w/ MD OK'ing a med hold for missed time d/t pt being unable to participate in fnct tasks. Cont tx per PT POC per pt's tolerance.     Jesse Savage - 04/11/2021 9:30 EDT   Time Spent With Patient   PTA Therapeutic Exercise Units :   2 units   PT Therapeutic Exercise Time :   30 minutes   PTA Self Care ADLTraining Units :   1 units   PT Self Care ADL Training Time :   15 minutes   PT Total Individual Time Rehab :   45    PT Treatment Time Comment :   Pt remains functionally limited d/t the severity of his neck pain. Pt's MD made aware of pt's continued limitations w/ MD OK'ing a med hold for missed time d/t pt being unable to participate in fnct tasks.    PT Total Timed Code Treatment Unit Rehab :   3    PT Total Timed Code Treatment Minutes Rehab :   45    PT Total Treatment Time Rehab :   45 minutes   Jesse Savage - 04/11/2021 9:30 EDT   PT Units Cancelled Missed     PT Units Lost #1          Amount :    45               Reason :    Other: refer to note above                Jesse Savage - 04/11/2021 9:30 EDT         PT Minutes Grid     PT Minutes Lost #1          Amount :    3                 Jesse Savage - 04/11/2021 9:30 EDT         PT Time In :   8:30 EST   PT Time Out :   9:15 EST   Jesse Savage - 04/11/2021 9:02 EDT   Safety   Patient  Position Rehab 1 :   Supine in bed   Jesse Savage - 04/11/2021 9:30 EDT   Safety Time Rehab 1 :   9:15 EST   Jesse Savage - 04/11/2021 9:02 EDT   Environmental Safety Implemented Rehab 1 :   Bed in low position, Bed alarm, Wheels locked, Call device within reach, Personal items within reach   Jesse Savage - 04/11/2021 9:30 EDT

## 2021-04-11 NOTE — Other (Signed)
IRF-PAI Quality Indicators Disch -Text       IRF-PAI Quality Indicators Discharge Entered On:  04/11/2021 22:10 EDT    Performed On:  04/11/2021 21:55 EDT by Drue Second Nicole-LPN               Section A: Administrative Information Discharge   Med Rec List To Provider At Disch :   Yes   A2122 Route Of Med Rec List To Provider :   Electronic Health Record, Paper-based   Diamond Nickel - 04/12/2021 9:42 EDT   No Transport to Med Appts/Meetings/Work/Meds/Necessities :   No   IRF Discharge Info RTF :   Discharge Living Setting  STW_RHB_IRF_Discharge_Info  Discharge to Living Setting  IRF-PAI: [facility] (04/11/21 15:55:00)     Drue Second Nicole-LPN - 40/02/8118 21:55 EDT   Section B: Hearing, Speech and Vision Discharge   Health Literacy SILS :   Never   Drue Second Nicole-LPN - 14/12/8293 21:55 EDT   Section D: Mood   Little Interest,Pleasure in Doing Things :   Not at all (0-1 Day)   Feeling Down, Depressed or Hopeless :   Not at all (0-1 Day)   PHQ Symptom Frequency Total :   0    FERGUSON, RN, CHANTEL M - 04/12/2021 9:42 EDT   PHQ Patient Able To Respond :   Yes   Little Interest,Pleasure, Present :   No   PHQ2 Little Interest Frequency Sum :   0    Feeling Down, Hopeless, Present :   No   PHQ2 Feeling Down Frequency Sum :   0    Should The PHQ-2 To 9 Be Continued :   No   PHQ Symptom Presence Total :   0    Total Severity Score PHQ-2 To 9 :   0    Social Isolation PROMIS :   Never   Drue Second Nicole-LPN - 62/06/3084 21:55 EDT   Section J: Discharge Health Conditions   Pain Or Hurting In The Past 5 Days :   Yes   Pain Effect On Sleep Past 5 Days :   Frequently   Pain Interference With Therapy Activities Past 5 Days :   Almost constantly   Pain Effect On Daily Activity Past 5 Day :   Frequently   J1800 Any Falls Since Admission :   No   Drue Second Nicole-LPN - 57/01/4695 21:55 EDT   Section K: Swallowing/Nutritional Status Discharge   Nutrition Approach Rcvd Last  7 Day :   None of above   Nutritional Approaches Rcvd At Discharge :   None of the above   Drue Second Nicole-LPN - 29/10/2839 21:55 EDT   Section N: Discharge Medications   L2440 High-Risk Drug Classes     IRF-PAI Antipsychotic  IRF-PAI Anticoagulant  IRF-PAI Antibiotic  IRF-PAI Antiplatelet    Is taking :    Yes   No   No   No     Indication noted :    Yes                Emelda Fear RN, Milagros Reap - 04/12/2021 9:42 EDT  Emelda Fear RN, Leron Croak M - 04/12/2021 9:42 EDT  Emelda Fear, RN, Leron Croak M - 04/12/2021 9:42 EDT  Emelda Fear, RN, CHANTEL M - 04/12/2021 9:42 EDT        IRF-PAI Hypoglycemic  IRF-PAI Opioid        Is taking :  No   Yes           Indication noted :       Yes Emelda Fear RN, CHANTEL M - 04/12/2021 9:42 EDT             Emelda Fear, RN, CHANTEL M - 04/12/2021 9:42 EDT  Drue Second Nicole-LPN - 36/11/4401 21:55 EDT            K7425 Medication Intervention :   Not applicable - There were no potential clinically significant medication issues identified since admission or patient is not taking any medications.   Emelda Fear, RN, CHANTEL M - 04/12/2021 9:42 EDT     Section O: Special Treatments, Procedures and Programs   O0110A Chemotherapy :   N/A   O0110B Radiation Therapy :   N/A   O0110C Oxygen Therapy :   N/A   O0110D Suctioning :   N/A   O0110E Tracheostomy Care :   N/A   Marvel Plan M - 04/12/2021 9:42 EDT   O0110H IV Medications     O0110 H1. IV Medications  O0110 H2. Vasoactive medications  O0110 H3. Antibiotics  O0110 H4. Anticoagulation    Is taking :    No   No   No   No       Diamond Nickel - 04/12/2021 9:42 EDT  Emelda Fear, RN, CHANTEL M - 04/12/2021 9:42 EDT  Emelda Fear, RN, CHANTEL M - 04/12/2021 9:42 EDT  Emelda Fear, RN, CHANTEL M - 04/12/2021 9:42 EDT        O0110 H10. Other          Is taking :    No                Emelda Fear, RN, Milagros Reap - 04/12/2021 9:42 EDT         O0100O IV Access :   N/A   Emelda Fear RN, CHANTEL M - 04/12/2021 9:42 EDT   Respiratory Information RTF :   Respiratory  Documentation and Orders   Oxygen Therapy    Oxygen Therapy: Room air (04/11/21)   Oxygen Therapy: Room air (04/11/21)   Oxygen Therapy: Room air (04/11/21)   Oxygen Therapy: Room air (04/10/21)   Oxygen Therapy: Room air (04/10/21)   Oxygen Therapy: Room air (04/10/21)   Oxygen Therapy: Room air (04/10/21)   Oxygen Therapy: Room air (04/10/21)   Oxygen Therapy: Room air (04/10/21)   Oxygen Therapy: Room air (04/10/21)   Oxygen Therapy: Room air (04/10/21)   Oxygen Therapy: Room air (04/10/21)   Oxygen Therapy: Room air (04/10/21)   Oxygen Therapy: Room air (04/10/21)   Oxygen Therapy: Room air (04/09/21)   Oxygen Therapy: Room air (04/09/21)   Oxygen Therapy: Room air (04/09/21)   Oxygen Therapy: Room air (04/09/21)   Oxygen Therapy: Room air (04/09/21)   Oxygen Therapy: Room air (04/09/21)   Oxygen Therapy: Room air (04/08/21)   Oxygen Therapy: Room air (04/08/21)   Oxygen Therapy: Room air (04/08/21)   Oxygen Therapy: Room air (04/08/21)   Oxygen Therapy: Room air (04/08/21)   Oxygen Therapy: Room air (04/07/21)   Oxygen Therapy: Room air (04/07/21)   Oxygen Therapy: Room air (04/07/21)   Oxygen Therapy: Room air (04/06/21)   Oxygen Therapy: Room air (04/06/21)   Oxygen Therapy: Room air (04/06/21)   Oxygen Therapy: Room air (04/06/21)   Oxygen Therapy: Room air (04/06/21)   Oxygen Therapy: Room air (04/05/21)   Oxygen Therapy: Room air (04/05/21)   Oxygen Therapy:  Room air (04/05/21)   Oxygen Therapy: Room air (04/05/21)   Oxygen Therapy: Room air (04/04/21)   Oxygen Therapy: Room air (04/04/21)       Respiratory Orders   No qualifying data available.     Drue Second Nicole-LPN - 93/07/3555 21:55 EDT

## 2021-04-11 NOTE — Case Communication (Signed)
CM Discharge Planning Assessment - Text       CM Discharge Planning Ongoing Assessment Entered On:  04/11/2021 13:46 EDT    Performed On:  04/11/2021 13:38 EDT by Gasper Lloyd S               Discharge Needs I   Previously Documented Discharge Needs :   DISCHARGE PLAN/NEEDS:  EQUIPMENT/TREATMENT NEEDS:       Previously Documented Benefits Information :   Performed By: Caryn Bee  - 04/04/21 08:16:00       Anticipated Discharge Date :   04/11/2021 EDT   Anticipated Discharge Time Slot :   1600-1800   Discharge To :   Family support, Other: WILL TRANSFER BACK TO A/C   CM Progress Note :   04/05/2021: MEDICAL RECORDS REVIEWED. PATIENT ADMITTED FROM [facility]. PATIENT LIVES WITH HIS WIFE GAIL [[phone]] IN A 1 LEVEL HOME WITH 3 STEPS TO ENTER. PATIENT KNOWN TO [facility] FROM PREVIOUS ADMISSION. PATIENT WENT TO OUTPATIENT THERAOY AT [facility] AFTER DISCHARGE FROM [facility]. MD WILL DO IPOC. 1st TC WILL BE THURSDAY 11/3. MSW WILL FOLLOW FOR D/C PLANNING AND CASE MANAGEMENT.    04/11/2021: TC TODAY. PER [facility] MD DONOVAN, PATIENT WILL DISCHARGE TO A/C TODAY OR TOMORROW UNDER NEUROSURGEY SERVICES AT [facility] VS [facility] AT [facility]. TRANSPORT WILL NEED TO BE SETUP IF PATIENT TRANSFERS TO [facility]. MRI ORDERED. RESULTS WILL DETERMINE WHICH SERVICE PATIENT WILL TRANSFER TO. PAIN HAS REMAINED A BARRIER TO PATIENT'S FULL PARTICIPATION IN REHAB REG[facility]. MSW CONTINUES TO FOLLOW AND WILL FINALIZE DISCHARGE PLAN.  [facility] MD DONOVAN HAS DISCUSSED WITH PATIENT AND WIFE.     Melonie [place] - 04/11/2021 13:38 EDT   Discharge Needs II   Needs Assistance at Home Upon Discharge :   Yes   Requires Caregiver Involvement :   Yes   Discharge Planning Time Spent :   45 minutes   Melonie [place] - 04/11/2021 13:38 EDT   Discharge Planning   Discharge Arrangements :       Patient Post-Acute Information    Patient Name: MUKHTAR, SHAMS  MRN: [id number]  FIN: [id number]  Gender: Male  DOB: 2060/09/03  Age:  60 Years        *** No Post-Acute Placement(s) Listed ***           *** No Post-Acute Service(s) Listed Nurse, mental health [facility] Services :   ACUTE CARE TRANSFER     Interventions Performed :   Home Environment Barriers Addressed   Discharge Plan Discussion :   Discussed with patient   Melonie [place] - 04/11/2021 13:38 EDT

## 2021-04-12 ENCOUNTER — Inpatient Hospital Stay
Admit: 2021-04-12 | Discharge: 2021-04-14 | Disposition: A | Discharge status: Home Health | Payer: MEDICARE | Attending: Specialist | Admitting: Specialist

## 2021-04-12 ENCOUNTER — Encounter: Attending: Specialist | Primary: Internal Medicine

## 2021-04-12 NOTE — Procedures (Signed)
 IntraOp Record - S[facility]             IntraOp Record - S[facility] Summary                                                                   Primary Physician:        VIKI SINK C-MD    Case Number:              [id number]    Finalized Date/Time:      04/12/21 10:24:27    Pt. Name:                 Jesse Savage, Jesse Savage    D.O.B./Sex:               07-11-1960    Male    Med Rec #:                [id number]    Physician:                VIKI SINK C-MD    Financial #:              [id number]    Pt. Type:                 I    Room/Bed:                 0509/01    Admit/Disch:              04/11/21 22:18:00 -    Institution:       [facility] - Case Times                                                                                                         Entry 1                                                                                                          Patient      In Room Time             04/12/21 08:00:00               Out Room Time                   04/12/21 10:17:00    Anesthesia     Procedure  Start Time               04/12/21 08:58:00               Stop Time                       04/12/21 10:05:00    Last Modified By:         Tobie OBIE Pina A                              04/12/21 10:23:39      SFOR - Case Times Audit                                                                          04/12/21 10:23:39         Owner: NEIDA                               Modifier: CAWLSH                                                        <+> 1         Out Room Time        <+> 1         Stop Time     04/12/21 09:07:12         Owner: NEIDA                               Modifier: CAWLSH                                                        <+> 1         Start Time        S[facility] - Case Attendance                                                                                                    Entry 1                         Entry 2                         Entry 3  Case  Attendee             [place],  [place] C-MD         DAVIS,  SUZEN JESSEE Endo, RN, Shelly A    Role Performed            Surgeon Primary                 First Assistant                 Circulator    Time In                   04/12/21 08:00:00               04/12/21 08:00:00               04/12/21 08:00:00    Time Out                  04/12/21 10:17:00               04/12/21 10:17:00               04/12/21 10:17:00    Procedure                 Posterior Cervical              Posterior Cervical              Posterior Cervical                              Laminectomy/Discectom           Laminectomy/Discectom           Laminectomy/Discectom    Last Modified By:         Endo RN, Rico DELENA Endo, RN, Rico DELENA Endo, RN, Shelly A                              04/12/21 10:24:10               04/12/21 10:24:10               04/12/21 10:24:10                                Entry 4                         Entry 5                         Entry 6                                          Case Attendee             POOLE, RN, MARY FRANCES         HUNT,  SABRINA L-CRNA           [place],  MERILEE  JOSEPH-MD    Role Performed            Circulator Add'l                CRNA                            Anesthesiologist    Time In                   04/12/21 08:00:00               04/12/21 08:00:00               04/12/21 08:00:00    Time Out                  04/12/21 08:30:00               04/12/21 10:17:00               04/12/21 10:17:00    Procedure                 Posterior Cervical              Posterior Cervical              Posterior Cervical                              Laminectomy/Discectom           Laminectomy/Discectom           Laminectomy/Discectom    Last Modified By:         Tobie RN, Rico DELENA Tobie, RN, Rico DELENA Tobie, RN, Shelly A                              04/12/21  10:24:10               04/12/21 10:24:10               04/12/21 10:24:10                                Entry 7                         Entry 8                                                                          Case Attendee             [place],  Kattie M          POOLE, RN, [facility]    Role Performed            Surgical Scrub                  Circulator Relief    Time In  04/12/21 08:00:00               04/12/21 09:16:00    Time Out                  04/12/21 10:17:00               04/12/21 09:35:00    Procedure                 Posterior Cervical              Posterior Cervical                              Laminectomy/Discectom           Laminectomy/Discectom    Last Modified By:         Tobie RN, Rico DELENA Tobie, RN, Shelly A                              04/12/21 10:24:10               04/12/21 10:24:10      SFOR - Case Attendance Audit                                                                     04/12/21 10:24:10         Owner: CAWLSH                               Modifier: CAWLSH                                                            1     <+> Time Out            1     <*> Procedure                              Posterior Cervical Laminectomy/Discectom            2     <+> Time Out            2     <*> Procedure                              Posterior Cervical Laminectomy/Discectom            3     <+> Time Out            3     <*> Procedure                              Posterior Cervical Laminectomy/Discectom  4     <*> Procedure                              Posterior Cervical Laminectomy/Discectom            5     <+> Time Out            5     <*> Procedure                              Posterior Cervical Laminectomy/Discectom            6     <+> Time Out            6     <*> Procedure                              Posterior Cervical Laminectomy/Discectom            7     <+> Time Out            7     <*> Procedure                               Posterior Cervical Laminectomy/Discectom            8     <*> Procedure                              Posterior Cervical Laminectomy/Discectom     04/12/21 09:46:23         Owner: CAWLSH                               Modifier: CAWLSH                                                            1     <*> Procedure                              Posterior Cervical Laminectomy/Discectom            2     <+> Time In            2     <*> Procedure                              Posterior Cervical Laminectomy/Discectom            3     <+> Time In            3     <*> Procedure                              Posterior Cervical Laminectomy/Discectom            4     <+> Time In  4     <*> Procedure                              Posterior Cervical Laminectomy/Discectom            5     <+> Time In            5     <*> Procedure                              Posterior Cervical Laminectomy/Discectom            6     <+> Time In            6     <*> Procedure                              Posterior Cervical Laminectomy/Discectom            7     <+> Time In            7     <*> Procedure                              Posterior Cervical Laminectomy/Discectom        <+> 8         Case Attendee        <+> 8         Role Performed        <+> 8         Time In        <+> 8         Time Out        <+> 8         Procedure     04/12/21 09:16:41         Owner: CAWLSH                               Modifier: CAWLSH                                                        <+> 2         Case Attendee        <+> 2         Role Performed        <+> 2         Procedure        <+> 3         Case Attendee        <+> 3         Role Performed        <+> 3         Procedure        <+> 4         Case Attendee        <+> 4         Role Performed        <+> 4  Time Out        <+> 4         Procedure        <+> 5         Case Attendee        <+> 5         Role Performed        <+> 5         Procedure        <+> 6         Case Attendee        <+> 6          Role Performed        <+> 6         Procedure        <+> 7         Case Attendee        <+> 7         Role Performed        <+> 7         Procedure     04/12/21 09:07:06         Owner: CAWLSH                               Modifier: CAWLSH                                                            1     <+> Time In            1     <*> Procedure                              Posterior Cervical Laminectomy/Discectom        SFOR - Skin Assessment                                                                          Pre-Care Text:            A.240 Assesses baseline skin condition  Im.120 Implements protective measures to prevent skin or tissue injury           due to mechanical sources   Im.280.1 Implements progective measures to prevent skin or tissue injury due to           thermal sources  Im.360 Monitors for signs and symptons of infection                              Entry 1  Skin Integrity            Not Intact/Abnormality          Abnormality                     SURGICAL INCISION                                                              Description                     POSTERIOR CERVICAL    Last Modified By:         Tobie, RN, Rico A                              04/12/21 09:43:37    Post-Care Text:            E.10 Evaluates for signs and symptoms of physical injury to skin and tissue  E.270 Evaluate tissue perfusion           O.60 Patient is free from signs and symptoms of injury caused by extraneous objects    O.210 Patinet's tissue           perfusion is consistent with or improved from baseline levels      SFOR - Patient Positioning                                                                      Pre-Care Text:            A.240 Assesses baseline skin condition  A.280 Identifies baseline musculoskeletal status  A.280.1 Identifies           physical alterations that require additional  precautions for procedure-specific positioning  A.510.8 Maintains           patient's dignity and privacy  Im.120 Implements protective measures to prevent skin/tissue injury due to           mechanical sources  Im.40 Positions the patient  Im.80 Applies safety devices                              Entry 1                                                                                                          Procedure                 Posterior Cervical  Body Position                   Prone                              Laminectomy/Discectom    Left Arm Position         Tucked and Padded at            Right Arm Position              Tucked and Padded at                              Side                                                            Side    Left Leg Position         Extended Security               Right Leg Position              Extended Security                              Strap, Pillow Under                                             Strap, Pillow Under                              Lower Leg, Padding                                              Lower Leg, Padding                              Under Knee                                                      Under Knee    Feet Uncrossed            Yes                             Pressure Points                 Yes  Checked     Positioning Device        Head Positioner Skull           Positioned By                   [place], RN, Shelly A,                              Pins, Foam Padding,                                             POOLE, RN, [employer], Safety Strap,                                           FRANCES, DAVIS,                              Gel Roll                                                        KIMBERLY A-NP, HUNT,                                                                                              SABRINA L-CRNA    Outcome Met  (O.80)        Yes    Last Modified By:         Tobie, RN, Rico A                              04/12/21 09:52:32    Post-Care Text:            E.10 Evaluates for signs and symptoms of physical injury to skin and tissue  E.290 Evaluates musculoskeletal           status  O.80 Patient is free from signs and symptoms of injury related to positioning  O.120 the patient is           free from signs and symptoms of injury related to transfer/transport   O.250 Patient's musculoskeletal status           is maintained at or improved from baseline levels      SFOR - Skin Prep  Pre-Care Text:            A.30 Verifies allergies  A.20 Verifies procedure, surgical site, and laterality  A.510.8 Maintains paritnet's           dignity and privacy  Im.270 Performs Skin Preparation  Im.270.1 Implements protective measures to prevent skin           and tissue injury due to chemical sources   A.300.1 Protects from cross-contamination                              Entry 1                                                                                                          Hair Removal     Skin Prep      Prep Agents (Im.270)     Povidone Iodine Scrub           Prep Area (Im.270)              Neck, Back                              7.5%, Povidone Iodine                              Solution 10%,                              Chlorhexidine Gluconate                              2% w/Alcohol      Prep By                  Tobie, RN, Shelly A    Outcome Met (O.100)       Yes    Last Modified By:         Tobie, RN, Rico A                              04/12/21 09:53:09    Post-Care Text:            E.10 Evaluates for signs and symptoms of physical injury to skin and tissue  O.100 Patient is free from signs           and symptoms of chemical injury   O.740 The patient's right to privacy is maintained      SFOR - Counts Initial and Final  Pre-Care Text:            A.20 Verifies operative procedure, sugical site, and laterality  A.20.2 Assesses the risk for unintended           retained foreign body  Im.20 Performs required counts                              Entry 1                                                                                                          Initial Counts      Initial Counts           POOLE, RN, MARY                 Items included in               Sponges, Sharps     Performed By             CATHLEAN Eddy,           the Initial Count                               Truett HERO    Final Counts      Final Counts             Tobie, RN, Rico A,           Final Count Status              Correct     Performed By             Eddy Truett HERO     Items Included in        Sponges, Sharps     Final Count     Outcome Met (O.20)        Yes    Last Modified By:         Tobie RN, Rico A                              04/12/21 09:46:43    Post-Care Text:            E.50 Evaluates results of the surgical count  O.20 Patient is free from unintended retained foreign objects      SFOR - Counts Additional                                                                        Pre-Care Text:            A.20 Verifies operative procedure, sugical site, and  laterality  A.20.2 Assesses the risk for unintended           retained foreign body  Im.20 Performs required counts                              Entry 1                                                                                                          Additional Count          Closing Count                   Additional Count                [place],  Truett HERO,    Type                                                      Participants                    POOLE, RN, MARY FRANCES    Count Status              Correct                         Items Counted                   Sponges, Sharps    Outcome Met (O.20)        Yes    Last Modified By:          Tobie RN, Rico A                              04/12/21 09:48:12    Post-Care Text:            E.50 Evaluates results of the surgical count  O.20 Patient is free from unintended retained foreign objects      SFOR - General Case Data                                                                        Pre-Care Text:            A.350.1 Classifies surgical wound                              Entry 1  Case Information      ASA Class                3                               Case Level                      Level 4     OR                       SF 10                           Specialty                       Neurosurgery (SN)     Wound Class              1-Clean    Preop Diagnosis           POST OPERATIVE SEROMA           Postop Diagnosis                POST OPERATIVE SEROMA    Last Modified By:         Tobie RN, Rico A                              04/12/21 10:23:51    Post-Care Text:            O.760 Patient receives consistent and comparable care regardless of the setting      [facility] - General Case Data Audit                                                                   04/12/21 10:23:51         Owner: CAWLSH                               Modifier: CAWLSH                                                        <+> 1         Preop Diagnosis        <+> 1         Postop Diagnosis     04/12/21 09:50:19         Owner: CAWLSH                               Modifier: CAWLSH                                                        <+>  1         ASA Class        SFOR - Fire Risk Assessment                                                                                               Entry 1                                                                                                          Fire Risk                 Alcohol  Based Prep              Fire Risk Score                 3+    Assessment: If             Solution, Surgical Site    checked, checkmark        Above Xiphoid, Ignition    = 1 point                 Source In Use    Fire Risk Protocol     (Reference Only)     Last Modified By:         Tobie RN, Rico A                              04/12/21 09:38:00      SFOR - Time Out                                                                                 Pre-Care Text:            A.10 Confirms patient identity  A.20 Verifies operative procedure, surgical site, and laterality  A.20.1           Verifies consent for planned procedure  A.30 Verifies allergies                              Entry 1  Procedure                 Posterior Cervical              Patient name and                Yes                              Laminectomy/Discectom           DOB confirmed.                                                               Allergies                                                               confirmed. Surgical                                                               procedure to be                                                               performed confirmed                                                               and verified by                                                               completed surgical                                                               consent     Surgical site             Yes                             Essential imaging,  Yes    confirmed. Correct                                        required blood     surgical site                                             products, implants,     marked and initials                                       devices and/or     are visible through                                       special equipment     prepped and draped                                        available and     field (or                                                  sterilization     alternative ID band                                       indicators confirmed     used), if applicable     Surgeon shares            Yes                             Anesthesia shares               Yes    operative plan,                                           anesthetic plan and     anticipated                                               reviews patient     specimens                                                 specific concerns     discussed, possible  and confirms     difficulties,                                             administration of     expected duration,                                        antibiotics, if     anticipated blood                                         applicable     loss and reviews     all     critical/specific     concerns     Fire risk                 Yes                             Surgeon states:                 Yes    assessment scored                                         Does anyone have     and plan discussed                                        any concerns? If                                                               you see, suspect,                                                               or feel that                                                               patient care is                                                               being compromised,  speak up for                                                               patient safety     Time Out Complete         04/12/21 08:37:00    Last Modified By:         Tobie RN, Rico A                              04/12/21 09:46:00    Post-Care Text:            E.30 Evaluates verification process for correct patient, site, side, and level surgery      SFOR - Debrief                                                                                  Pre-Care  Text:            Im.330 Manages specimen handling and disposition                              Entry 1                                                                                                          Procedure                 Posterior Cervical              Final counts                    Yes                              Laminectomy/Discectom           correct and                                                               verbally verified  with                                                               surgeon/licensed                                                               independent                                                               practitioner (if                                                               applicable)     Actual procedure          Yes                             Postop diagnosis                Yes    performed confirmed                                       confirmed     Wound                     Yes                             Confirm specimens               Yes    classification                                            and specimens     confirmed                                                 labeled                                                               appropriately (if  applicable)     Equipment problems        Yes                             Foley catheter                  Yes    addressed (if                                             removed (if     applicable)                                               applicable)     Patient recovery          Yes                             Debrief Complete                04/12/21 10:00:00    plan confirmed     Last Modified By:         Tobie RN, Rico A                              04/12/21 10:23:30    Post-Care Text:            E.800 Ensures continuity of care  E.50 Evaluates  results of the surgical count  O.30 Patient's procedure is           performed on the correct site, side, and level  O.50 patient's current status is communicated throughout the           continuum of care  O.40 Patient's specimen(s) is managed in the appropriate manner      SFOR - Cautery                                                                                  Pre-Care Text:            A.240 Assesses baseline skin condition  A280.1 Identifies baseline musculoskeletal status  Im.50 Implements           protective measures to prevent injury due to electrical sources   Im.60 Uses supplies and equipment within safe           parameters  Im.80 Applies safety devices                              Entry 1  ESU Type                  GENERATOR CODMAN BIPOLAR        Identification                  [id number]                                                              Number     Bipolar Setting           35                              Grounding Pad                   No    (watts)                                                   Needed?     Outcome Met (O.10)        Yes    Last Modified By:         Tobie, RN, Shelly A                              04/12/21 09:49:59    Post-Care Text:            E.10 Evaluates for signs and symptoms of physical injury to skin and tissue  O.10 Patient is free from signs           and symptoms of injury related to thermal sources   O.70 Patient is free from signs and symptoms of electrical           injury    General Comments:            BOVIE PAD PLACED ON PT'S POSTERIOR LEFT THIGH, BOVIE NOT USED FOR CASE      SFOR - Patient Care Devices                                                                     Pre-Care Text:            A.200 Assesses risk for normothermia regulation  A.40 Verifies presence of prosthetics or corrective devices           Im.280 Implements thermoregulation  measures  Im.60 Uses supplies and equipment within safe parameters                              Entry 1                         Entry 2  Equipment Type            MACHINE SEQUENTIAL              BAIR HUGGER                              COMPRESSION    SCD Sleeve Site           Legs Bilateral    Equipment/Tag Number      [id number]                          [id number]    Initiated Pre             Yes    Induction     Last Modified By:         Tobie, RN, Rico DELENA Tobie, RN, Shelly A                              04/12/21 09:45:48               04/12/21 09:45:48    Post-Care Text:            E.10 Evaluates signs and symptoms of physical injury to skin and tissue  O.60 Patient is free from signs and           symptoms of injury caused by extraneous objects      [facility] - Medications                                                                              Pre-Care Text:            A.10 Confirms patient identity  A.30 Verifies allergies  Im.220 Administers prescribed medications  Im.220.2           Administers prescribed antibiotic therapy as ordered                              Entry 1                         Entry 2                         Entry 3                                          Time Administered     Medication                VANCOMYCIN  1MG /0.1ML            BUPIVACAINE 0.25% W/EPI         GENTAMICIN 80MG -NACL  MPF INJ                         0.9% INJECTION 1000ML    Route of Admin            Topical                         Local Injection                 Irrigation    Dose/Volume               1 gm                            10ML                            1000 ML ON FIELD    (include amount and     unit of measure)     Site                      Neck                            Neck                            Neck    Site Detail     Administered By           VIKI SINK  C-MD         WORTHINGTON,  WARD C-MD         VIKI,  WARD C-MD    Outcome Met (O.130)       Yes                             Yes                             Yes    Last Modified By:         Tobie, RN, Rico DELENA Tobie, RN, Rico DELENA Tobie, RN, Shelly A                              04/12/21 09:36:11               04/12/21 09:44:28               04/12/21 09:44:28    Post-Care Text:            E.20 Evaluates response to medications  O.130 Patient receives appropriately administerd medication(s)      [facility] - Medications Audit  04/12/21 09:44:28         Owner: [facility]                               Modifier: [facility]                                                        <+> 2         Medication        <+> 2         Route of Admin        <+> 2         Administered By        <+> 2         Dose/Volume (include amount and                      unit of measure)        <+> 2         Outcome Met (O.130)        <+> 2         Site        <+> 3         Medication        <+> 3         Route of Admin        <+> 3         Administered By        <+> 3         Dose/Volume (include amount and                      unit of measure)        <+> 3         Outcome Met (O.130)        <+> 3         Site        [facility] - Implants/Endoscopy Stents                                                                Pre-Care Text:            A.20 Verifies operative procedure, surgical site, and laterality  A.20.1 Verifies consent for planned procedure            Im.350 Records implants inserted during the operative or invasive procedure                              Entry 1                         Entry 2                         Entry 3  Implant/Explant           Implant                         Implant                         Implant    Implant     Identification      Catalog #               [id number]                           [id number]                          [id number]     Description              DURA SEALANT SYSTEM             DURA SEALANT SYSTEM             GRAFT DURAL MATRIX                              DURASEAL LOW SWELL          DURASEAL LOW SWELL          DURAGEN PLUS 2IN X 2IN     Expiration Date          10/07/22                        05/08/22                        09/07/23     Lot Number               [id number]                        [id number]                        [id number]     Unique ID Number      Manufacturer             [facility]     Serial Number     Usage Data      Implant Site             posterior cervical              psterior cervical               posterior cervical     Quantity                 1                               1  1    Bone and Tissue      Reconstituted                                            N/A                             N/A     Solution Used      Lot Number      Other Solution     Last Modified By:         Tobie, RN, Rico DELENA Tobie, RN, Rico DELENA Tobie, RN, Shelly A                              04/12/21 09:35:11               04/12/21 09:35:11               04/12/21 09:35:11    Post-Care Text:            E.30 Evaluates verification process for correct patient, site, side and level surgery  O.30 Patient's procedure           is performed on the correct site, side, and level      [facility] - Implants/Endoscopy Stents Audit                                                           04/12/21 09:35:11         Owner: [facility]                               Modifier: [facility]                                                            1     <*> Description                            DURA SEALANT SYSTEM DURASEAL LOW SWELL            1     <*> Lot Number                             [id number]            1     <*> Manufacturer                           [employer]            1     <*>  Expiration Date  10/07/22            1     <*> Implant Site                           posterior cervical            1     <*> Quantity                               1            1     <*> Catalog #                             [id number]            1     <*> Implant/Explant                        Implant            2     <*> Description                            DURA SEALANT SYSTEM DURASEAL LOW SWELL            2     <*> Lot Number                             [id number]            2     <*> Manufacturer                           [employer]            2     <*> Expiration Date                        05/08/22            2     <*> Implant Site                           psterior cervical            2     <*> Quantity                               1            2     <*> Catalog #                             [id number]            2     <*> Implant/Explant                        Implant            2     <*> Reconstituted  N/A        Entry 3 was deleted.  Higher numbered entries shifted one position to fill the gap.        <-> 3         Description                            DURA SEALANT SYSTEM DURASEAL LOW SWELL        <-> 3         Lot Number                             [id number]        <-> 3         Manufacturer                           [employer]        <-> 3         Expiration Date                        09/07/23        <-> 3         Implant Site                           posterior cervical        <-> 3         Quantity                               1        <-> 3         Catalog #                             [id number]        <-> 3         Implant/Explant                        Implant        <-> 3         Reconstituted                          N/A     04/12/21 09:30:07         Owner: [facility]                               Modifier: CA[facility]                                                        <+> 3         Description        <+> 3         Lot Number        <+> 3          Manufacturer        <+>  3         Expiration Date        <+> 3         Implant Site        <+> 3         Quantity        <+> 3         Catalog #        <+> 3         Implant/Explant        <+> 3         Reconstituted        [facility] - Tubes, Drains, Catheters                                                                 Pre-Care Text:            A.310 Identifies factors associated with an increased risk for hemorrhage or fluid and electrolyte imbalance           Im.250 Administers care to invasive device sites                              Entry 1                                                                                                          Device Description        DRAIN WOUND FLAT W/         Location                        Neck                              TROCAR    Last Modified By:         Tobie, RN, Rico A                              04/12/21 09:38:18    Post-Care Text:            E.340 Evaluates tubes and drains are intact and functioning as planned  O.60 Patient is free from signs and           symptoms of injury caused by extraneous objects      [facility] - Dressing/Packing  Pre-Care Text:            A.350 Assesses susceptibility for infection  Im.250 Administers care to invasive devices  Im.290 Administer           care to wound sites   Im.300 Implements aseptic technique                              Entry 1                                                                                                          Site                      Neck    Dressing Item     Details      Dressing Item            Silver Impregnated     (Im.290)                 Dressing    Last Modified By:         Tobie, RN, Rico A                              04/12/21 09:50:32    Post-Care Text:            E.320 Evaluate factors associted with increased risk for postoperative infection at the completion of the           procedure  O.200 Patient's wound  perfusion is consistent with or improved from baseline levels   O.Patient is           free from signs and symptoms of infection      [facility] - Procedures                                                                               Pre-Care Text:            A.20 Verifies operative procedure, surgical site, and laterality  Im.150 Develops individualized plan of care                              Entry 1  Procedure     Description      Procedure                Posterior Cervical              Surgical Procedure              I&D OF POST-OPERATIVE                              Laminectomy/Discectomy/D        Text                            SEROMA - POSTERIOR                              ecompression SCIP                                               CERVICAL    Primary Procedure         Yes                             Primary Surgeon                 [place],  [place] C-MD    Start                     04/12/21 08:58:00               Stop                            04/12/21 10:05:00    Anesthesia Type           General                         Surgical Service                Neurosurgery (SN)    Wound Class               1-Clean    Last Modified By:         Tobie OBIE Pina A                              04/12/21 10:23:52    Post-Care Text:            O.730 The patient's care is consistent with the individualized perioperative plan of care      [facility] - Procedures Audit                                                                          04/12/21 10:23:52         Owner: [facility]  Modifier: [facility]                                                        <+> 1         Stop        [facility] - Transfer                                                                                                           Entry 1                                                                                                           Transferred By            Tobie, RN, Shelly A,           Via                             Bed                              PERLEY HUSBAND L-CRNA    Post-op Destination       PACU    Skin Assessment      Condition                Not Intact / Abnormality        Description                     SURGICAL INCISION    Last Modified By:         Tobie OBIE Pina A                              04/12/21 09:53:45      Case Comments                                                                                         <None>  Finalized By: Tobie RN, Shelly A      Document Signatures                                                                             Signed By:           Tobie RN, Shelly A 04/12/21 10:24

## 2021-04-12 NOTE — Nursing Note (Signed)
Adult Patient History Form-Text       Adult Patient History Entered On:  04/11/2021 17:14 EDT    Performed On:  04/11/2021 17:13 EDT by Beryle Flock B-RN               General Info   Patient Identified :   Verbal   Patient Identified :   Jesse Savage   Information Given By :   Self, Written documentation   Preferred Mode of Communication :   Verbal, Written   In Charge of News (ICON) Name :   Jesse Savage - [phone]   Pregnancy Status :   N/A   In Charge of News (ICON) Code :   1571   Has the patient received chemotherapy or immunotherapy (cytotoxic)  in the last 48-72 hours? :   No   In Clinical Trial With Signed Consent for Related Condition :   No signed consent for clinical trial   Is the patient currently (2-3 days) receiving radiation treatment? :   No   Beryle Flock B-RN - 04/11/2021 17:13 EDT   Immunizations   Influenza Vaccine Status :   Patient refused   Beryle Flock B-RN - 04/11/2021 17:13 EDT   ID Risk Screen Symptoms   Recent Travel History :   No recent travel   TB Symptom Screen :   No symptoms   Last 90 days COVID-19 ID :   No   Close Contact with COVID-19 ID :   No   Last 14 days COVID-19 ID :   No   C. diff Symptom/History ID :   Neither of the above   MRSA/VRE Screening :   <30 days from previous admission, Admitted from a Nursing Home or another healthcare facility   CRE Screening :   No   Beryle Flock B-RN - 04/11/2021 17:13 EDT   Bloodless Medicine   Is Blood Transfusion Acceptable to Patient :   Yes   Beryle Flock B-RN - 04/11/2021 17:13 EDT   Nutrition   MST Does Your Current Diet Include :   None   Nutrition Screen for Malnutrition :   Patient denies   Beryle Flock B-RN - 04/11/2021 17:13 EDT   Functional   Sensory Deficits :   None   ADLs Prior to Admission :   Independent   Beryle Flock B-RN - 04/11/2021 17:13 EDT   Social History   Social History   (As Of: 04/11/2021 17:14:52 EDT)   Tobacco:        Tobacco use: Never (less than 100 in lifetime).   (Last  Updated: 08/25/2019 09:36:30 EDT by Demetrius Revel L-FNP)          Electronic Cigarette/Vaping:        Never Electronic Cigarette Use.   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 :   No   Do you have any religious/spiritual/cultural beliefs that could impact the way your care is provided? :   No   Beryle Flock B-RN - 04/11/2021 17:13 EDT   Harm Screen   Suspect or Concern for: :   None   Feels Safe Where Live :   Yes   Last 3 mo, thoughts killing self/others :   Patient denies   Cognitively Impaired :   No   Ambulatory or Self Mobile in [facility] :   Yes   Beryle Flock B-RN - 04/11/2021 17:13 EDT   Advance Directive   Location of Advance Directive :   Copy obtained from previous records   Advance Directive :   Yes   Type of Advance Directive :   Living will, Medical durable power of attorney   Patient Wishes to Receive Further Information on Advance Directives :   No   Beryle Flock B-RN - 04/11/2021 17:13 EDT   Education   Written Language :   [place]   Primary Language :   Sharman Cheek B-RN - 04/11/2021 17:13 EDT   Caregiver/Advocate Language   Patient :   Demonstration, Printed materials, Verbal explanation   Beryle Flock B-RN - 04/11/2021 17:13 EDT   Barriers to Learning :   None evident   Teaching Method :   Explanation   Beryle Flock B-RN - 04/11/2021 17:13 EDT   DC Needs   CM Living Situation :   Acute Rehab   Home Caregiver Name/Relationship :   Patient   Anticipated  Discharge Needs :   Transportation   Beryle Flock B-RN - 04/11/2021 17:13 EDT   Valuables and Belongings   Does Patient Have Valuables and Belongings :   Yes   Dorna Leitz E-RN - 04/12/2021 7:39 EDT   Valuables and Belongings   At Bedside :   Clothes   Dorna Leitz E-RN - 04/12/2021 7:39 EDT   Patient Search Completed :   Harrell Lark E-RN - 04/12/2021 7:39 EDT   Admission Complete   Admission Complete :   Yes   Dorna Leitz E-RN - 04/12/2021 7:39 EDT

## 2021-04-12 NOTE — Progress Notes (Signed)
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 Helper assists only prior to or following the activity. - 05   Oral Hygiene :   Setup or clean-up assistance - Helper sets up or cleans up; Patient/Resident completes activity. Helper assists only prior to or following the activity. - 05   Toileting Hygiene :   Substantial/Maximal assistance - Helper does MORE THAN HALF the effort. Helper lifts or holds trunk or limbs and provides more than half the effort. - 02   Toilet Transfer :   Substantial/Maximal assistance - Helper does MORE THAN HALF the effort. Helper lifts or holds trunk or limbs and provides more than half the effort. - 02   Shower, Bathe Self :   Substantial/Maximal assistance - Helper does MORE THAN HALF the effort. Helper lifts or holds trunk or limbs and provides more than half the effort. - 02   Upper Body Dressing :   Substantial/Maximal assistance - Helper does MORE THAN HALF the effort. Helper lifts or holds trunk or limbs and provides more than half the effort. - 02   Lower Body Dressing :   Substantial/Maximal assistance - Helper does MORE THAN HALF the effort. Helper lifts or holds trunk or limbs and provides more than half the effort. - 02   Putting On, Taking Off Footwear :    Substantial/Maximal assistance - Helper does MORE THAN HALF the effort. Helper lifts or holds trunk or limbs and provides more than half the effort. - 02   Grooming :   Substantial/Maximal assistance - Helper does MORE THAN HALF the effort. Helper lifts or holds trunk or limbs and provides more than half the effort. - 02   Tub Transfer :   Not attempted due to medical condition or safety concerns - 80   Shower Transfer :   Not attempted due to medical condition or safety concerns - 84   Jesse Savage - 04/12/2021 14:45 EDT   ADL Comments :   11/3: ADL not completed this date due to level of pain. Patient reporting 10 out of 10 pain in his neck. Based on clinical assessment of pain and mobility in bed, patient would require Max Assist with All ADLs. Patient is managing his urinal.     ADL eval 10/28  All scores reflect post intervention    Eating: setup supine c HOB elevated  Oral hygiene: setup supine c HOB elevated  Toilet hygiene: not observed, anticipate min A rolling  Toilet t/f: not observed, anticipate min A rolling on/off bedpan  Bathe: supine c HOB elevated, min A for  back  UB dressing: touch A to manage around head, seated EOB  LB dressing: setup rolling L<>R supine  Grooming: setup supine c HOB elevated     OT ADL Reviewed :   Jesse Savage - 04/12/2021 14:45 EDT   Assessment   OT Impairments or Limitations :   Balance deficits, Basic activity of daily living deficits, Coordination deficits, Endurance deficits, Equipment training, IADL deficits, Mobility deficits, Pain, Range of motion deficits, Safety awareness deficits, Sensory deficits, Strength deficits   Barriers to Safe Discharge OT :   Medical diagnosis, Past medical history   OT Discharge Recommendations :   Home with wife, HHOT     OT Treatment Recommendations :   Pt discharged from inpatient rehab unit to acute care due to elevated neck/shoulder pain and inability to tolerate therapy.     Jesse Savage - 04/12/2021 14:45 EDT   Long  Term Goals   OT Patient/Caregiver Goal :   Pt would like to restore PLOF   Jesse Savage - 04/12/2021 14:45 EDT   OT IP Long Term Goals Grid     Long Term Goal 1          Goal :    Pt will be able to stand with no UE support in order to participate in IADL's              Status :    Not met                Jesse Savage - 04/12/2021 14:45 EDT         OT Long Term Goals 2019   Eating Goal :   Independent - Patient/Resident completes the activities by him/herself, with or without an assistive device, with no assistance from a helper. - 06   Oral Hygiene Goal :   Independent - Patient/Resident completes the activities by him/herself, with or without an assistive device, with no assistance from a helper. - 06   Toileting Hygiene Goal :   Partial/Moderate assistance - Helper does LESS THAN HALF the effort. Helper lifts, holds or supports trunk or limbs, but provides less than half the effort. - 03   (Comment: NOT MET [Jesse Savage,  Jesse Savage - 04/12/2021 14:45 EDT] )   Shower, Bathe Self Goal :   Partial/Moderate assistance - Helper does LESS THAN HALF the effort. Helper lifts, holds or supports trunk or limbs, but provides less than half the effort. - 03   (Comment: NOT MET [Jesse Savage,  Jesse Savage - 04/12/2021 14:45 EDT] )   Upper Body Dressing Goal :   Partial/Moderate assistance - Helper does LESS THAN HALF the effort. Helper lifts, holds or supports trunk or limbs, but provides less than half the effort. - 03   (Comment: NOT MET [Jesse Savage,  Jesse Savage - 04/12/2021 14:45 EDT] )   Lower Body Dressing Goal :   Partial/Moderate assistance - Helper does LESS THAN HALF the effort. Helper lifts, holds or supports trunk or limbs, but provides less than half the effort. - 03   (Comment: NOT MET [Jesse Savage,  Jesse Savage - 04/12/2021 14:45 EDT] )   Put On,Taking Off Footwear Goal :   Substantial/Maximal assistance - Helper does MORE THAN HALF the effort. Helper lifts or holds trunk or limbs and provides more than half the effort. - 02   (Comment:  NOT MET [Jesse Savage,  Jesse Savage - 04/12/2021 14:45 EDT] )   Toilet Transfer Goal :   Partial/Moderate assistance -  Helper does LESS THAN HALF the effort. Helper lifts, holds or supports trunk or limbs, but provides less than half the effort. - 03   (Comment: NOT MET [Jesse Savage,  Jesse Savage - 04/12/2021 14:45 EDT] )   Constance Goltz,  Jesse Savage - 04/12/2021 14:45 EDT   OT LTG Reconcilation :   Downgraded LTGs due to increased pain and limited movement at this time.   OT LT Goals Reviewed :   Jesse Savage - 04/12/2021 14:45 EDT   Short Term Goals   Bathing Goal Grid     Goal #1          Activity :    Shower transfer              Assist :    Minimal assistance              Equipment :    Tub bench              Status :    Not met                Jesse Savage - 04/12/2021 14:45 EDT         Toileting and Transfers Goal Grid     Goal #1          Activity :    Toilet transfers              Assist :    Minimal assistance              Transfer Equipment :    Drop arm commode              Status :    Not met                Jesse Savage - 04/12/2021 14:45 EDT         OT Balance Goal Grid     Goal #1          Type :    Static standing              Descriptors :    Two upper extremities support              Assist :    Contact guard assistance              Length of Time (minutes) :    2 minutes              Rationale :    Improve independence with activities of daily living              Status :    Not met                Jesse Savage - 04/12/2021 14:45 EDT         OT ST Goals Reviewed :   Dossie Arbour,  Jesse Savage - 04/12/2021 14:45 EDT   Plan   Therapy Activity Tolerance :   15 hours every 7 days   Planned Treatments :   Discharge/Discontinue OT treatment   Treatment Plan/Goals Established With Patient/Caregiver :   Yes   OT Evaluation Complete :   Yes   Jesse Savage - 04/12/2021 14:45 EDT   Time Spent With Patient   OT Therapeutic Exercise Units :   0 units   OT Therapeutic Exercise Time :   0 minutes   OT Total Individual Therapy  Time Rehab :   0    OT Total Timed Code Treatment Unit Rehab :   0    OT Total Timed Code Treatment Minutes Rehab :   0    OT Total Treatment Time Rehab :   0 minutes   Jesse Savage - 04/12/2021 14:45 EDT   Image 1 -  Images currently included in the form version of this document have not been included in the text rendition version of the form.   Section C: Signs and Symptoms of Delirium   Acute Onset Mental Change :   No   Inattention :   Behavior not present   Disorganized Thinking :   Behavior not present   Altered Level Of Consciousness :   Behavior not present   Earl Lites L-PT - 04/13/2021 15:18 EDT   Section GG: Self Care Abilities   Section GG: Self Care Abilities 2019   *Eating :   Setup or clean-up assistance - Helper sets up or cleans up; Patient/Resident completes activity. Helper assists only prior to or following the activity. - 05   *Oral Hygiene :   Setup or clean-up assistance - Helper sets up or cleans up; Patient/Resident completes activity. Helper assists only prior to or following the activity. - 05   *Toileting Hygiene :   Substantial/Maximal assistance - Helper does MORE THAN HALF the effort. Helper lifts or holds trunk or limbs and provides more than half the effort. - 02   *Shower, Bathe Self :   Substantial/Maximal assistance - Helper does MORE THAN HALF the effort. Helper lifts or holds trunk or limbs and provides more than half the effort. - 02   *Upper Body Dressing :   Substantial/Maximal assistance - Helper does MORE THAN HALF the effort. Helper lifts or holds trunk or limbs and provides more than half the effort. - 02   *Lower Body Dressing :   Substantial/Maximal assistance - Helper does MORE THAN HALF the effort. Helper lifts or holds trunk or limbs and provides more than half the effort. - 02   *Putting On, Taking Off Footwear :   Substantial/Maximal assistance - Helper does MORE THAN HALF the effort. Helper lifts or holds trunk or limbs and provides more than half the  effort. - 02   *Toilet Transfer :   Substantial/Maximal assistance - Helper does MORE THAN HALF the effort. Helper lifts or holds trunk or limbs and provides more than half the effort. - 02   Jesse Savage - 04/12/2021 14:45 EDT

## 2021-04-12 NOTE — Nursing Note (Signed)
 Adult Patient History Form-Text       Adult Patient History Entered On:  04/12/2021 2:19 EDT    Performed On:  04/12/2021 2:15 EDT by CHUBB, RN, ELIZABETH K               General Info   Patient Identified :   Identification band, Verbal   Patient Identified :   Jesse Savage   Information Given By :   Self   Preferred Mode of Communication :   Verbal, Written   Accompanied By :   None   Pregnancy Status :   N/A   Has the patient received chemotherapy or immunotherapy (cytotoxic)  in the last 48-72 hours? :   No   In Clinical Trial With Signed Consent for Related Condition :   No signed consent for clinical trial   Is the patient currently (2-3 days) receiving radiation treatment? :   No   CHUBB, RN, ELIZABETH K - 04/12/2021 2:15 EDT   Allergies   (As Of: 04/12/2021 02:19:22 EDT)   Allergies (Active)   codeine  Estimated Onset Date:   Unspecified ; Reactions:   NA ; Created By:   CLARISE LOT D; Reaction Status:   Active ; Category:   Drug ; Substance:   codeine ; Type:   Allergy ; Severity:   Unknown ; Updated By:   CLARISE LOT D; Reviewed Date:   04/12/2021 2:16 EDT      Cymbalta  Estimated Onset Date:   Unspecified ; Reactions:   NA ; Created By:   CLARISE LOT D; Reaction Status:   Active ; Category:   Drug ; Substance:   Cymbalta ; Type:   Allergy ; Severity:   Unknown ; Updated By:   CLARISE LOT D; Reviewed Date:   04/12/2021 2:16 EDT      Neurontin  Estimated Onset Date:   Unspecified ; Reactions:   NA ; Created By:   CLARISE LOT D; Reaction Status:   Active ; Category:   Drug ; Substance:   Neurontin ; Type:   Allergy ; Severity:   Unknown ; Updated By:   MCMURRAY,  NATHAN D; Reviewed Date:   04/12/2021 2:16 EDT        Medication History   Medication List   (As Of: 04/12/2021 02:19:22 EDT)   Normal Order    Lactated Ringers  Injection solution 1,000 mL  :   Lactated Ringers  Injection solution 1,000 mL ; Status:   Future ; Ordered As Mnemonic:   Lactated Ringers  Injection 1,000 mL ; Simple Display  Line:   40 mL/hr, IV ; Ordering Provider:   NICHOLAUS SUZEN ASKEW; Catalog Code:   Lactated Ringers  Injection ; Order Dt/Tm:   04/11/2021 19:10:09 EDT ; Comment:   Perioperative use ONLY  For Non Dialysis Patient          Sodium Chloride  0.9% intravenous solution 1,000 mL  :   Sodium Chloride  0.9% intravenous solution 1,000 mL ; Status:   Future ; Ordered As Mnemonic:   Sodium Chloride  0.9% 1,000 mL ; Simple Display Line:   40 mL/hr, IV ; Ordering Provider:   NICHOLAUS SUZEN ASKEW; Catalog Code:   Sodium Chloride  0.9% ; Order Dt/Tm:   04/11/2021 19:10:10 EDT ; Comment:   Perioperative use ONLY          lidocaine  5% Topical Film  :   lidocaine  5% Topical Film ; Status:   Ordered ; Ordered As  Mnemonic:   Lidoderm  5% topical film ; Simple Display Line:   2 patches, Topical, Daily ; Ordering Provider:   NICHOLAUS SUZEN ASKEW; Catalog Code:   lidocaine  topical ; Order Dt/Tm:   04/11/2021 19:06:34 EDT ; Comment:   Apply patch to intact skin. Patch may remain in place for up to 12 hours in any 24-hour period.  12 HOURS ON THEN 12 HOURS OFF          polyethylene glycol 3350  Oral Powder for Recon  :   polyethylene glycol 3350  Oral Powder for Recon ; Status:   Ordered ; Ordered As Mnemonic:   MiraLax  ; Simple Display Line:   17 g, 1 packets, Oral, Daily ; Ordering Provider:   NICHOLAUS SUZEN ASKEW; Catalog Code:   polyethylene glycol 3350  ; Order Dt/Tm:   04/11/2021 19:06:57 EDT          tiZANidine  4 mg Tab  :   tiZANidine  4 mg Tab ; Status:   Ordered ; Ordered As Mnemonic:   tiZANidine  ; Simple Display Line:   4 mg, 1 tabs, Oral, TID ; Ordering Provider:   NICHOLAUS SUZEN ASKEW; Catalog Code:   tiZANidine  ; Order Dt/Tm:   04/11/2021 19:07:02 EDT ; Comment:   HOLD FOR SEDATION AND SBP < 110          aluminum hydroxide/magnesium  hydroxide/simethicone  200 mg-200 mg-20 mg/5 mL  :   aluminum hydroxide/magnesium  hydroxide/simethicone  200 mg-200 mg-20 mg/5 mL ; Status:   Ordered ; Ordered As Mnemonic:   aluminum hydroxide/magnesium   hydroxide/simethicone  200 mg-200 mg-20 mg/5 mL oral suspension ; Simple Display Line:   30 mL, Oral, q4hr, PRN: indigestion ; Ordering Provider:   NICHOLAUS SUZEN ASKEW; Catalog Code:   Al hydroxide/Mg hydroxide/simethicone  ; Order Dt/Tm:   04/11/2021 22:18:47 EDT          bisacodyl  10 mg Rectal Supp  :   bisacodyl  10 mg Rectal Supp ; Status:   Ordered ; Ordered As Mnemonic:   bisacodyl  ; Simple Display Line:   10 mg, 1 supp, PR, Daily, PRN: constipation ; Ordering Provider:   NICHOLAUS SUZEN ASKEW; Catalog Code:   bisacodyl  ; Order Dt/Tm:   04/11/2021 22:18:47 EDT ; Comment:   unrelieved by docusate-senna          diphenhydrAMINE  25 mg Cap  :   diphenhydrAMINE  25 mg Cap ; Status:   Ordered ; Ordered As Mnemonic:   diphenhydrAMINE  ; Simple Display Line:   25 mg, 1 caps, Oral, q6hr, PRN: itching/allergic reaction ; Ordering Provider:   NICHOLAUS SUZEN ASKEW; Catalog Code:   diphenhydrAMINE  ; Order Dt/Tm:   04/11/2021 22:18:47 EDT          docusate-senna 50 mg-8.6 mg Tab  :   docusate-senna 50 mg-8.6 mg Tab ; Status:   Ordered ; Ordered As Mnemonic:   docusate-senna 50 mg-8.6 mg oral tablet ; Simple Display Line:   1 tabs, Oral, BID, PRN: constipation ; Ordering Provider:   NICHOLAUS SUZEN ASKEW; Catalog Code:   docusate-senna ; Order Dt/Tm:   04/11/2021 22:18:47 EDT ; Comment:   hold for loose stools          ondansetron  2 mg/mL Inj Soln 2 mL  :   ondansetron  2 mg/mL Inj Soln 2 mL ; Status:   Ordered ; Ordered As Mnemonic:   ondansetron  ; Simple Display Line:   4 mg, 2 mL, IV Push, q6hr, PRN: nausea/vomiting ; Ordering Provider:  DAVIS,  KIMBERLY A-NP; Catalog Code:   ondansetron  ; Order Dt/Tm:   04/11/2021 22:18:47 EDT ; Comment:   use ondansetron  before promethazine  if both are ordered          ondansetron  4 mg Tab  :   ondansetron  4 mg Tab ; Status:   Ordered ; Ordered As Mnemonic:   ondansetron  ; Simple Display Line:   4 mg, 1 tabs, Oral, q6hr, PRN: nausea/vomiting ; Ordering Provider:   NICHOLAUS SUZEN ASKEW;  Catalog Code:   ondansetron  ; Order Dt/Tm:   04/11/2021 22:18:47 EDT ; Comment:   use ondansetron  before promethazine  if both are ordered          promethazine  25 mg/mL Inj Soln 1 mL  :   promethazine  25 mg/mL Inj Soln 1 mL ; Status:   Ordered ; Ordered As Mnemonic:   promethazine  range dose ; Simple Display Line:   12.5 mg, 0.5 mL, IM, q6hr, PRN: nausea/vomiting ; Ordering Provider:   NICHOLAUS SUZEN ASKEW; Catalog Code:   promethazine  ; Order Dt/Tm:   04/11/2021 22:18:47 EDT ; Comment:   Use ondansetron  before promethazine  if both ordered. Lower doses of 6.25-12.5mg  are recommended for pts > 62 yo.  WHEN ADMINISTERING IV, ALWAYS DILUTE IN 9 ML NORMAL SALINE FOR A TOTAL VOLUME OF - [FINAL CONCENTRATION OF 2.5 MG/ML] AND PUSH OVER 1 MINUTE **DO NOT DILUTE FOR IM ADMINISTRATION** Use oral then rectal route first, if ordered.  THIS MEDICATION IS ASSOCIATED WITH AN INCREASED RISK OF FALLS          promethazine  25 mg Rectal Supp  :   promethazine  25 mg Rectal Supp ; Status:   Ordered ; Ordered As Mnemonic:   promethazine  range dose ; Simple Display Line:   12.5 mg, 0.5 supp, PR, q6hr, PRN: nausea/vomiting ; Ordering Provider:   NICHOLAUS SUZEN ASKEW; Catalog Code:   promethazine  ; Order Dt/Tm:   04/11/2021 22:18:47 EDT ; Comment:   Use ondansetron  before promethazine  if both ordered. Lower doses of 6.25-12.5mg  are recommended for pts > 67 yo.  THIS MEDICATION IS ASSOCIATED   WITH   AN INCREASED RISK OF FALLS.          promethazine  25 mg Tab  :   promethazine  25 mg Tab ; Status:   Ordered ; Ordered As Mnemonic:   promethazine  range dose ; Simple Display Line:   12.5 mg, 0.5 tabs, Oral, q6hr, PRN: nausea/vomiting ; Ordering Provider:   NICHOLAUS SUZEN ASKEW; Catalog Code:   promethazine  ; Order Dt/Tm:   04/11/2021 22:18:47 EDT ; Comment:   Use ondansetron  before promethazine  if both ordered. Lower doses of 6.25-12.5mg  are recommended for pts > 67 yo.  THIS MEDICATION IS ASSOCIATED   WITH   AN INCREASED RISK OF  FALLS.          simethicone  125 mg Chew Tab  :   simethicone  125 mg Chew Tab ; Status:   Ordered ; Ordered As Mnemonic:   simethicone  ; Simple Display Line:   125 mg, 1 tabs, Oral, QID, PRN: gas ; Ordering Provider:   NICHOLAUS SUZEN ASKEW; Catalog Code:   simethicone  ; Order Dt/Tm:   04/11/2021 22:18:47 EDT ; Comment:   max dose 500 mg in 24 hours          acetaminophen  500 mg Tab  :   acetaminophen  500 mg Tab ; Status:   Ordered ; Ordered As Mnemonic:   acetaminophen  ; Simple Display  Line:   1,000 mg, 2 tabs, Oral, TID ; Ordering Provider:   NICHOLAUS SUZEN ASKEW; Catalog Code:   acetaminophen  ; Order Dt/Tm:   04/11/2021 19:06:03 EDT ; Comment:   Scheduled for multimodal pain control.  Do not exceed 4000mg  APAP in 24 hours.          amitriptyline  25 mg Tab  :   amitriptyline  25 mg Tab ; Status:   Ordered ; Ordered As Mnemonic:   amitriptyline  ; Simple Display Line:   25 mg, 1 tabs, Oral, Once a Day (at bedtime) ; Ordering Provider:   NICHOLAUS SUZEN ASKEW; Catalog Code:   amitriptyline  ; Order Dt/Tm:   04/11/2021 19:06:06 EDT ; Comment:    THIS MEDICATION IS ASSOCIATED   WITH   AN INCREASED RISK OF FALLS.          diazePAM  2 mg Tab  :   diazePAM  2 mg Tab ; Status:   Ordered ; Ordered As Mnemonic:   Valium  ; Simple Display Line:   2 mg, 1 tabs, Oral, TID ; Ordering Provider:   NICHOLAUS SUZEN ASKEW; Catalog Code:   diazepam  ; Order Dt/Tm:   04/11/2021 19:06:13 EDT          hydrocortisone  1% Lotion 118 mL  :   hydrocortisone  1% Lotion 118 mL ; Status:   Ordered ; Ordered As Mnemonic:   hydrocortisone  1% topical lotion ; Simple Display Line:   1 app, Topical, TID ; Ordering Provider:   NICHOLAUS SUZEN ASKEW; Catalog Code:   hydrocortisone  topical ; Order Dt/Tm:   04/11/2021 19:06:25 EDT ; Comment:    THIS IS A PATIENT SPECIFIC   MEDICATION IN YOUR PYXIS MACHINE            Standard Oral Supplement  :   Standard Oral Supplement ; Status:   Completed ; Ordered As Mnemonic:   Nutrition Supplement - Ensure Enlive  (Standard) Oral Supplement ; Simple Display Line:   350 Calories, Oral, BID ; Ordering Provider:   SYSTEM,  SYSTEM; Catalog Code:   Standard Oral Supplement ; Order Dt/Tm:   04/11/2021 19:07:11 EDT ; Comment:   Order ended because NPO order started          ceFAZolin   :   ceFAZolin  ; Status:   Future ; Ordered As Mnemonic:   ceFAZolin  IVPB ; Simple Display Line:   2 g, IV Piggyback, On Call ; Ordering Provider:   NICHOLAUS SUZEN ASKEW; Catalog Code:   ceFAZolin  ; Order Dt/Tm:   04/11/2021 19:10:10 EDT          lactulose  10 g/15 mL Oral Syrup 30 mL  :   lactulose  10 g/15 mL Oral Syrup 30 mL ; Status:   Ordered ; Ordered As Mnemonic:   lactulose  ; Simple Display Line:   20 g, 30 mL, Oral, Daily, PRN: constipation ; Ordering Provider:   NICHOLAUS SUZEN ASKEW; Catalog Code:   lactulose  ; Order Dt/Tm:   04/11/2021 19:06:29 EDT          oxyCODONE  5 mg Tab  :   oxyCODONE  5 mg Tab ; Status:   Ordered ; Ordered As Mnemonic:   oxyCODONE  range dose ; Simple Display Line:   10 mg, 2 tabs, Oral, q4hr, PRN: severe pain (8-10) ; Ordering Provider:   NICHOLAUS SUZEN ASKEW; Catalog Code:   oxyCODONE  ; Order Dt/Tm:   04/11/2021 19:06:46 EDT ; Comment:   5 mg for moderate pain, 10  mg for severe pain          tiZANidine  4 mg Tab  :   tiZANidine  4 mg Tab ; Status:   Discontinued ; Ordered As Mnemonic:   tiZANidine  ; Simple Display Line:   4 mg, 1 tabs, Oral, TID ; Ordering Provider:   NICHOLAUS SUZEN ASKEW; Catalog Code:   tiZANidine  ; Order Dt/Tm:   04/10/2021 14:09:16 EDT ; Comment:   HOLD FOR SEDATION AND SBP < 110          gadobutrol 604.72 mg/mL (12mmol/mL) IV Soln 2 mL  :   gadobutrol 604.72 mg/mL (1mmol/mL) IV Soln 2 mL ; Status:   Completed ; Ordered As Mnemonic:   Gadavist ; Simple Display Line:   7.69 mL, IV Push, Once ; Ordering Provider:   NICHOLAUS SUZEN ASKEW; Catalog Code:   gadobutrol ; Order Dt/Tm:   04/10/2021 08:37:58 EDT ; Comment:   Target Dose: Gadavist 0.1 mL/kg  04/10/2021 08:38:00          diazePAM  2 mg Tab  :    diazePAM  2 mg Tab ; Status:   Discontinued ; Ordered As Mnemonic:   Valium  ; Simple Display Line:   2 mg, 1 tabs, Oral, TID ; Ordering Provider:   NICHOLAUS SUZEN ASKEW; Catalog Code:   diazepam  ; Order Dt/Tm:   04/09/2021 12:43:59 EDT          oxyCODONE  5 mg Tab  :   oxyCODONE  5 mg Tab ; Status:   Discontinued ; Ordered As Mnemonic:   oxyCODONE  range dose ; Simple Display Line:   10 mg, 2 tabs, Oral, q4hr, PRN: severe pain (8-10) ; Ordering Provider:   NICHOLAUS SUZEN ASKEW; Catalog Code:   oxyCODONE  ; Order Dt/Tm:   04/09/2021 12:42:36 EDT ; Comment:   5 mg for moderate pain, 10 mg for severe pain          hydrocortisone  1% Lotion 118 mL  :   hydrocortisone  1% Lotion 118 mL ; Status:   Discontinued ; Ordered As Mnemonic:   hydrocortisone  1% topical lotion ; Simple Display Line:   1 app, Topical, TID ; Ordering Provider:   NICHOLAUS SUZEN ASKEW; Catalog Code:   hydrocortisone  topical ; Order Dt/Tm:   04/07/2021 08:18:17 EDT ; Comment:    THIS IS A PATIENT SPECIFIC   MEDICATION IN YOUR PYXIS MACHINE            amitriptyline  25 mg Tab  :   amitriptyline  25 mg Tab ; Status:   Discontinued ; Ordered As Mnemonic:   amitriptyline  ; Simple Display Line:   25 mg, 1 tabs, Oral, Once a Day (at bedtime) ; Ordering Provider:   NICHOLAUS SUZEN ASKEW; Catalog Code:   amitriptyline  ; Order Dt/Tm:   04/06/2021 08:35:45 EDT ; Comment:    THIS MEDICATION IS ASSOCIATED   WITH   AN INCREASED RISK OF FALLS.          Standard Oral Supplement  :   Standard Oral Supplement ; Status:   Discontinued ; Ordered As Mnemonic:   Nutrition Supplement - Ensure Enlive (Standard) Oral Supplement ; Simple Display Line:   350 Calories, Oral, BID ; Ordering Provider:   NICHOLAUS SUZEN ASKEW; Catalog Code:   Standard Oral Supplement ; Order Dt/Tm:   04/06/2021 13:14:48 EDT          lidocaine  5% Topical Film  :   lidocaine  5% Topical Film ; Status:  Discontinued ; Ordered As Mnemonic:   Lidoderm  5% topical film ; Simple Display Line:   2 patches,  Topical, Daily ; Ordering Provider:   NICHOLAUS SUZEN ASKEW; Catalog Code:   lidocaine  topical ; Order Dt/Tm:   04/06/2021 09:37:25 EDT ; Comment:   Apply patch to intact skin. Patch may remain in place for up to 12 hours in any 24-hour period.  12 HOURS ON THEN 12 HOURS OFF          acetaminophen  500 mg Tab  :   acetaminophen  500 mg Tab ; Status:   Discontinued ; Ordered As Mnemonic:   acetaminophen  ; Simple Display Line:   1,000 mg, 2 tabs, Oral, TID ; Ordering Provider:   NICHOLAUS SUZEN ASKEW; Catalog Code:   acetaminophen  ; Order Dt/Tm:   04/06/2021 08:43:21 EDT ; Comment:   Scheduled for multimodal pain control.  Do not exceed 4000mg  APAP in 24 hours.          lactulose  10 g/15 mL Oral Syrup 30 mL  :   lactulose  10 g/15 mL Oral Syrup 30 mL ; Status:   Discontinued ; Ordered As Mnemonic:   lactulose  ; Simple Display Line:   20 g, 30 mL, Oral, Daily, PRN: constipation ; Ordering Provider:   NICHOLAUS SUZEN ASKEW; Catalog Code:   lactulose  ; Order Dt/Tm:   04/05/2021 14:45:11 EDT          polyethylene glycol 3350  Oral Powder for Recon  :   polyethylene glycol 3350  Oral Powder for Recon ; Status:   Discontinued ; Ordered As Mnemonic:   MiraLax  ; Simple Display Line:   17 g, 1 packets, Oral, Daily ; Ordering Provider:   NICHOLAUS SUZEN ASKEW; Catalog Code:   polyethylene glycol 3350  ; Order Dt/Tm:   04/04/2021 16:54:40 EDT          docusate-senna 50 mg-8.6 mg Tab  :   docusate-senna 50 mg-8.6 mg Tab ; Status:   Discontinued ; Ordered As Mnemonic:   docusate-senna 50 mg-8.6 mg oral tablet ; Simple Display Line:   1 tabs, Oral, BID ; Ordering Provider:   DONOVAN,  JAYNE M-MD; Catalog Code:   docusate-senna ; Order Dt/Tm:   04/04/2021 16:54:40 EDT ; Comment:   hold for loose stools          bisacodyl  10 mg Rectal Supp  :   bisacodyl  10 mg Rectal Supp ; Status:   Discontinued ; Ordered As Mnemonic:   bisacodyl  ; Simple Display Line:   10 mg, 1 supp, PR, Daily, PRN: constipation ; Ordering Provider:   DONOVAN,  JAYNE  M-MD; Catalog Code:   bisacodyl  ; Order Dt/Tm:   04/04/2021 16:52:20 EDT ; Comment:   Use Glycerine suppository first if ordered          A Patient Specific Medication  :   A Patient Specific Medication ; Status:   Discontinued ; Ordered As Mnemonic:   A Patient Specific Medication ; Simple Display Line:   1 EA, Kit-Combo, q5min, PRN: other (see comment) ; Ordering Provider:   DONOVAN,  JAYNE M-MD; Catalog Code:   A Patient Specific Medication ; Order Dt/Tm:   04/04/2021 16:12:36 EDT          A Patient Specific Refrigerated Medication  :   A Patient Specific Refrigerated Medication ; Status:   Discontinued ; Ordered As Mnemonic:   A Patient Specific Refrigerated Medication ; Simple Display Line:   1 EA, Kit-Combo, q5min, PRN:  other (see comment) ; Ordering Provider:   DONOVAN,  JAYNE M-MD; Catalog Code:   A Patient Specific Refrigerated Medicati ; Order Dt/Tm:   04/04/2021 16:12:36 EDT ; Comment:   to access the patient specific Refrigerated medications          Delivery and Return [place] Access  :   Delivery and Return [place] Access ; Status:   Discontinued ; Ordered As Mnemonic:   Delivery and Return Bin Access ; Simple Display Line:   1 EA, Kit-Combo, q5min, PRN: other (see comment) ; Ordering Provider:   DONOVAN,  JAYNE M-MD; Catalog Code:   Delivery and Return Bin Access ; Order Dt/Tm:   04/04/2021 16:12:36 EDT ; Comment:   This code grants access to the [employer] for the Delivery and Return Bin Access          lidocaine  1% PF Inj Soln 2 mL  :   lidocaine  1% PF Inj Soln 2 mL ; Status:   Discontinued ; Ordered As Mnemonic:   lidocaine  1% preservative-free injectable solution ; Simple Display Line:   0.25 mL, ID, q5min, PRN: other (see comment) ; Ordering Provider:   DONOVAN,  JAYNE M-MD; Catalog Code:   lidocaine  ; Order Dt/Tm:   04/04/2021 16:12:36 EDT ; Comment:   to access lidocaine  1%  2 mL vial for IV start and Life Port access          lidocaine  2% Topical Gel with applicator 10-11 mL  :    lidocaine  2% Topical Gel with applicator 10-11 mL ; Status:   Discontinued ; Ordered As Mnemonic:   Uro-Jet 2% topical gel with applicator ; Simple Display Line:   1 app, Topical, q5min, PRN: other (see comment) ; Ordering Provider:   DONOVAN,  JAYNE M-MD; Catalog Code:   lidocaine  topical ; Order Dt/Tm:   04/04/2021 16:12:36 EDT          Respiratory MDI Treatment  :   Respiratory MDI Treatment ; Status:   Discontinued ; Ordered As Mnemonic:   Respiratory MDI Treatment ; Simple Display Line:   1 EA, Kit-Combo, q5min, PRN: other (see comment) ; Ordering Provider:   DONOVAN,  JAYNE M-MD; Catalog Code:   Respiratory MDI Treatment ; Order Dt/Tm:   04/04/2021 16:12:36 EDT          sodium chloride  0.9% Inj Soln 10 mL syringe  :   sodium chloride  0.9% Inj Soln 10 mL syringe ; Status:   Discontinued ; Ordered As Mnemonic:   10 mL sodium chloride  0.9% flush syringe range dose ; Simple Display Line:   30 mL, IV Push, q5min, PRN: other (see comment) ; Ordering Provider:   DONOVAN,  JAYNE M-MD; Catalog Code:   sodium chloride  flush ; Order Dt/Tm:   04/04/2021 16:12:36 EDT          sodium chloride  flush  :   sodium chloride  flush ; Status:   Discontinued ; Ordered As Mnemonic:   3 mL sodium chloride  0.9% flush syringe ; Simple Display Line:   10 mL, IV Push, q5min, PRN: other (see comment) ; Ordering Provider:   DONOVAN,  JAYNE M-MD; Catalog Code:   sodium chloride  flush ; Order Dt/Tm:   04/04/2021 16:12:36 EDT ; Comment:   IV Flush replacement while 10 mL flushes are on backorder. NOT FOR CENTRAL LINE,  For Central line flush, submit patient specific missing med request to pharmacy.          sodium chloride  0.9% Inj  Soln 10 mL vial PF  :   sodium chloride  0.9% Inj Soln 10 mL vial PF ; Status:   Discontinued ; Ordered As Mnemonic:   sodium chloride  0.9% vial for reconstitution range dose ; Simple Display Line:   30 mL, IV Push, q5min, PRN: other (see comment) ; Ordering Provider:   DONOVAN,  JAYNE M-MD; Catalog Code:   sodium  chloride flush ; Order Dt/Tm:   04/04/2021 16:12:36 EDT ; Comment:   for access to sodium chloride  0.9% vial when needed as a diluent  for reconstitutable medications          sterile water Inj Soln 10 mL  :   sterile water Inj Soln 10 mL ; Status:   Discontinued ; Ordered As Mnemonic:   sterile water for reconstitution ; Simple Display Line:   10 mL, N/A, q5min, PRN: other (see comment) ; Ordering Provider:   DONOVAN,  JAYNE M-MD; Catalog Code:   sterile water for reconstitution ; Order Dt/Tm:   04/04/2021 16:12:36 EDT ; Comment:   Access sterile water when needed as a diluent  for reconstitutable medications. Not for IV use.            Home Meds    acetaminophen   :   acetaminophen  ; Status:   Documented ; Ordered As Mnemonic:   acetaminophen  500 mg oral tablet ; Simple Display Line:   1,000 mg, 2 tabs, Oral, TID, 0 Refill(s) ; Ordering Provider:   DONOVAN,  JAYNE M-MD; Catalog Code:   acetaminophen  ; Order Dt/Tm:   04/11/2021 12:57:10 EDT          amitriptyline   :   amitriptyline  ; Status:   Documented ; Ordered As Mnemonic:   amitriptyline  25 mg oral tablet ; Simple Display Line:   25 mg, 1 tabs, Oral, Once a Day (at bedtime), 0 Refill(s) ; Ordering Provider:   DONOVAN,  JAYNE M-MD; Catalog Code:   amitriptyline  ; Order Dt/Tm:   04/11/2021 12:57:18 EDT          diazepam   :   diazepam  ; Status:   Documented ; Ordered As Mnemonic:   Valium  2 mg oral tablet ; Simple Display Line:   2 mg, 1 tabs, Oral, TID, 0 Refill(s) ; Ordering Provider:   DONOVAN,  JAYNE M-MD; Catalog Code:   diazepam  ; Order Dt/Tm:   04/11/2021 12:57:23 EDT          docusate-senna  :   docusate-senna ; Status:   Documented ; Ordered As Mnemonic:   docusate-senna 50 mg-8.6 mg oral tablet ; Simple Display Line:   1 tabs, Oral, BID, 0 Refill(s) ; Ordering Provider:   DONOVAN,  JAYNE M-MD; Catalog Code:   docusate-senna ; Order Dt/Tm:   04/11/2021 12:57:25 EDT          lactulose   :   lactulose  ; Status:   Documented ; Ordered As Mnemonic:   lactulose  10  g/15 mL oral syrup ; Simple Display Line:   20 g, 30 mL, Oral, Daily, PRN: constipation, 0 Refill(s) ; Ordering Provider:   DONOVAN,  JAYNE M-MD; Catalog Code:   lactulose  ; Order Dt/Tm:   04/11/2021 12:57:34 EDT          lidocaine  topical  :   lidocaine  topical ; Status:   Documented ; Ordered As Mnemonic:   Lidoderm  5% topical film ; Simple Display Line:   2 patches, Topical, Daily, 0 Refill(s) ; Ordering Provider:   DONOVAN,  JAYNE M-MD; Catalog Code:  lidocaine  topical ; Order Dt/Tm:   04/11/2021 12:57:37 EDT          oxyCODONE   :   oxyCODONE  ; Status:   Documented ; Ordered As Mnemonic:   oxyCODONE  5 mg oral tablet ; Simple Display Line:   10 mg, 2 tabs, Oral, q4hr, PRN: severe pain (8-10), 0 Refill(s) ; Ordering Provider:   DONOVAN,  JAYNE M-MD; Catalog Code:   oxyCODONE  ; Order Dt/Tm:   04/11/2021 12:57:40 EDT          polyethylene glycol 3350   :   polyethylene glycol 3350  ; Status:   Documented ; Ordered As Mnemonic:   MiraLax  ; Simple Display Line:   17 g, 1 packets, Oral, Daily, 0 Refill(s) ; Ordering Provider:   DONOVAN,  JAYNE M-MD; Catalog Code:   polyethylene glycol 3350  ; Order Dt/Tm:   04/11/2021 12:57:49 EDT          tiZANidine   :   tiZANidine  ; Status:   Documented ; Ordered As Mnemonic:   tiZANidine  4 mg oral tablet ; Simple Display Line:   4 mg, 1 tabs, Oral, TID, 0 Refill(s) ; Ordering Provider:   DONOVAN,  JAYNE M-MD; Catalog Code:   tiZANidine  ; Order Dt/Tm:   04/11/2021 12:57:13 EDT          tiZANidine   :   tiZANidine  ; Status:   Discontinued ; Ordered As Mnemonic:   tiZANidine  4 mg oral tablet ; Simple Display Line:   4 mg, 1 tabs, Oral, q6hr-INT, 0 Refill(s) ; Ordering Provider:   NICHOLAUS SUZEN ASKEW; Catalog Code:   tiZANidine  ; Order Dt/Tm:   04/04/2021 13:23:09 EDT          acetaminophen   :   acetaminophen  ; Status:   Discontinued ; Ordered As Mnemonic:   acetaminophen  500 mg oral tablet ; Simple Display Line:   500 mg, 1 tabs, Oral, q6hr, 0 Refill(s) ; Ordering Provider:   NICHOLAUS SUZEN ASKEW; Catalog Code:   acetaminophen  ; Order Dt/Tm:   04/04/2021 13:22:07 EDT          acetaminophen -oxyCODONE   :   acetaminophen -oxyCODONE  ; Status:   Discontinued ; Ordered As Mnemonic:   Percocet  5/325 oral tablet ; Simple Display Line:   2 tabs, Oral, q4hr, PRN: severe pain (8-10), 0 Refill(s) ; Ordering Provider:   DONOVAN,  JAYNE M-MD; Catalog Code:   acetaminophen -oxyCODONE  ; Order Dt/Tm:   09/06/2019 13:56:57 EDT            Problem History   (As Of: 04/12/2021 02:19:22 EDT)   Problems(Active)    Acute myelopathy (SNOMED CT  :8784240981 )  Name of Problem:   Acute myelopathy ; Recorder:   LEWEY,  JENNIFER L-FNP; Confirmation:   Confirmed ; Classification:   Medical ; Code:   8784240981 ; Contributor System:   PowerChart ; Last Updated:   08/24/2019 9:04 EDT ; Life Cycle Status:   Active ; Responsible Provider:   RODOLFO NEST L-FNP; Vocabulary:   SNOMED CT        Back pain (SNOMED CT  :747688984 )  Name of Problem:   Back pain ; Recorder:   PYLE, RN, JOYCE; Confirmation:   Confirmed ; Classification:   Patient Stated ; Code:   747688984 ; Contributor System:   PowerChart ; Last Updated:   06/28/2019 11:25 EST ; Life Cycle Date:   06/28/2019 ; Life Cycle Status:   Active ; Vocabulary:   SNOMED CT  History of DVT of lower extremity (SNOMED CT  :7013061988 )  Name of Problem:   History of DVT of lower extremity ; Recorder:   RODOLFO NEST L-FNP; Confirmation:   Confirmed ; Classification:   Medical ; Code:   7013061988 ; Contributor System:   PowerChart ; Last Updated:   03/21/2021 11:03 EDT ; Life Cycle Date:   03/21/2021 ; Life Cycle Status:   Active ; Vocabulary:   SNOMED CT        OSA (obstructive sleep apnea) (SNOMED CT  :870110984 )  Name of Problem:   OSA (obstructive sleep apnea) ; Recorder:   PYLE, RN, JOYCE; Confirmation:   Confirmed ; Classification:   Patient Stated ; Code:   870110984 ; Contributor System:   PowerChart ; Last Updated:   06/28/2019 11:25 EST ; Life Cycle Date:    06/28/2019 ; Life Cycle Status:   Active ; Vocabulary:   SNOMED CT        Sciatica (SNOMED CT  :61272986 )  Name of Problem:   Sciatica ; Recorder:   PYLE, RN, JOYCE; Confirmation:   Confirmed ; Classification:   Patient Stated ; Code:   61272986 ; Contributor System:   PowerChart ; Last Updated:   06/28/2019 11:25 EST ; Life Cycle Date:   06/28/2019 ; Life Cycle Status:   Active ; Vocabulary:   SNOMED CT          Procedure History        -    Procedure History   (As Of: 04/12/2021 02:19:22 EDT)     Anesthesia Minutes:   0 ; Procedure Name:   Hernia ; Procedure Minutes:   0 ; Last Reviewed Dt/Tm:   04/12/2021 02:16:43 EDT            Anesthesia Minutes:   0 ; Procedure Name:   Tonsillectomy ; Procedure Minutes:   0 ; Last Reviewed Dt/Tm:   04/12/2021 02:16:43 EDT            Procedure Dt/Tm:   06/29/2019 11:32:00 EST ; Location[facility]   [facility] ; Provider:   GUERRY RONALEE ORN; Anesthesia Type:   Local ; Anesthesia Minutes:   0 ; Procedure Name:   Transforaminal Epidural Ster Inj ; Procedure Minutes:   2 ; Comments:     06/29/2019 11:34 EST - SHERRILEE, RN, [name] A  auto-populated from documented surgical case ; Clinical Service:   Surgery ; Last Reviewed Dt/Tm:   04/12/2021 02:16:43 EDT            Procedure Dt/Tm:   8004 ; Anesthesia Minutes:   0 ; Procedure Name:   thumb surgery ; Procedure Minutes:   0 ; Last Reviewed Dt/Tm:   04/12/2021 02:16:43 EDT            Procedure Dt/Tm:   04/22/2011 ; Anesthesia Minutes:   0 ; Procedure Name:   Lumbar fusion L4-5 ; Procedure Minutes:   0 ; Last Reviewed Dt/Tm:   04/12/2021 02:16:43 EDT            Procedure Dt/Tm:   8027 ; Anesthesia Minutes:   0 ; Procedure Name:   tonsils and adenoids ; Procedure Minutes:   0 ; Last Reviewed Dt/Tm:   04/12/2021 02:16:43 EDT            Procedure Dt/Tm:   2012 ; Anesthesia Minutes:   0 ; Procedure Name:   redo lumbar fusion L5-S1 ; Procedure Minutes:  0 ; Last Reviewed Dt/Tm:   04/12/2021 02:16:43 EDT            Anesthesia Minutes:   0 ; Procedure  Name:   colonoscopy ; Procedure Minutes:   0 ; Last Reviewed Dt/Tm:   04/12/2021 02:16:43 EDT            Procedure Dt/Tm:   8018 ; Anesthesia Minutes:   0 ; Procedure Name:   facial surgery ; Procedure Minutes:   0 ; Last Reviewed Dt/Tm:   04/12/2021 02:16:43 EDT            Procedure Dt/Tm:   2005 ; Anesthesia Minutes:   0 ; Procedure Name:   L5-S1 lumbar fusion ; Procedure Minutes:   0 ; Last Reviewed Dt/Tm:   04/12/2021 02:16:43 EDT            Procedure Dt/Tm:   08/26/2019 08:40:00 EDT ; Location[facility]   [facility] ; Provider:   VIKI,  CURTIS-MD; Anesthesia Type:   General ; :   DEBBY ADE L-MD; Anesthesia Minutes:   0 ; Procedure Name:   Anterior Cervical Discectomy with Fusion and/or Stabilization SCIP ; Procedure Minutes:   166 ; Comments:     08/26/2019 11:38 EDT - Glendora, RN, Sharlet PARAS  auto-populated from documented surgical case ; Clinical Service:   Surgery ; Last Reviewed Dt/Tm:   04/12/2021 02:16:43 EDT            Procedure Dt/Tm:   05/31/2020 11:05:00 EST ; Location:   [facility] ; Provider:   SHEENA HOMER W-MD; Anesthesia Type:   Local ; Anesthesia Minutes:   0 ; Procedure Name:   Transforaminal Epidural Ster Inj ; Procedure Minutes:   6 ; Comments:     05/31/2020 11:13 EST - PYLE, RN, JOYCE  auto-populated from documented surgical case ; Clinical Service:   Surgery ; Last Reviewed Dt/Tm:   04/12/2021 02:16:43 EDT            Procedure Dt/Tm:   05/31/2020 11:05:00 EST ; Location:   [facility] ; Provider:   SHEENA HOMER W-MD; Anesthesia Type:   Local ; Anesthesia Minutes:   0 ; Procedure Name:   Si Joint Injection ; Procedure Minutes:   6 ; Comments:     05/31/2020 11:13 EST - PYLE, RN, JOYCE  auto-populated from documented surgical case ; Clinical Service:   Surgery ; Last Reviewed Dt/Tm:   04/12/2021 02:16:43 EDT            Procedure Dt/Tm:   07/03/2020 13:53:00 EST ; Location:   [facility] ; Provider:   SHEENA HOMER W-MD; Anesthesia Type:   Local ; Anesthesia Minutes:    0 ; Procedure Name:   Lumbar Epidural Steroid Injection ; Procedure Minutes:   6 ; Comments:     07/03/2020 13:59 EST - Dasie, RN, Forbes A  auto-populated from documented surgical case ; Clinical Service:   Surgery ; Last Reviewed Dt/Tm:   04/12/2021 02:16:43 EDT            Procedure Dt/Tm:   07/03/2020 13:53:00 EST ; Location[facility] ; Provider:   SHEENA HOMER W-MD; Anesthesia Type:   Local ; Anesthesia Minutes:   0 ; Procedure Name:   Si Joint Injection (Bilateral) ; Procedure Minutes:   6 ; Comments:     07/03/2020 13:59 EST - Dasie, RN, Forbes A  auto-populated from documented surgical case ;  Clinical Service:   Surgery ; Last Reviewed Dt/Tm:   04/12/2021 02:16:43 EDT            Procedure Dt/Tm:   04/01/2021 09:21:00 EDT ; Location[facility] ; Provider:   VIKI SINK C-MD; Anesthesia Type:   General ; :   REETA SALOMON KICK; Anesthesia Minutes:   0 ; Procedure Name:   Posterior Cervical Laminectomy/Discectomy/Decompression SCIP ; Procedure Minutes:   194 ; Comments:     04/01/2021 12:48 EDT - Di, RN, Damien HERO  auto-populated from documented surgical case ; Clinical Service:   Surgery ; Last Reviewed Dt/Tm:   04/12/2021 02:16:43 EDT            Immunizations   Influenza Vaccine Status :   Patient refused   CHUBB, RN, ELIZABETH K - 04/12/2021 2:15 EDT   ID Risk Screen Symptoms   Recent Travel History :   No recent travel   TB Symptom Screen :   No symptoms   Last 90 days COVID-19 ID :   No   Close Contact with COVID-19 ID :   No   Last 14 days COVID-19 ID :   No   C. diff Symptom/History ID :   Neither of the above   Patient Pregnant :   None of the above   MRSA/VRE Screening :   Admitted from a Nursing Home or another healthcare facility   CRE Screening :   No   CHUBB, RN, ELIZABETH K - 04/12/2021 2:15 EDT   Bloodless Medicine   Is Blood Transfusion Acceptable to Patient :   Yes   CHUBB, RN, ELIZABETH K - 04/12/2021 2:15 EDT   Nutrition   MST Does Your Current Diet Include :   None    Nutrition Screen for Malnutrition :   Patient denies   CHUBB, RN, ELIZABETH K - 04/12/2021 2:15 EDT   Functional   Sensory Deficits :   None   ADLs Prior to Admission :   Independent   CHUBB, RN, ELIZABETH K - 04/12/2021 2:15 EDT   Social History   Social History   (As Of: 04/12/2021 02:19:22 EDT)   Tobacco:        Tobacco use: Never (less than 100 in lifetime).   (Last Updated: 08/25/2019 09:36:30 EDT by RODOLFO NEST L-FNP)          Electronic Cigarette/Vaping:        Never Electronic Cigarette Use.   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 :   No   Do you have any religious/spiritual/cultural beliefs that could impact the way your care is provided? :   No   CHUBB, RN, ELIZABETH K - 04/12/2021 2:15 EDT   Harm  Screen   Suspect or Concern for: :   None   Feels Safe Where Live :   Yes   Last 3 mo, thoughts killing self/others :   Patient denies   Cognitively Impaired :   No   Ambulatory or Self Mobile in WC :   No   CHUBB, RN, ELIZABETH K - 04/12/2021 2:15 EDT   Advance Directive   Advance Directive :   Yes   Type of Advance Directive :   Living will, Medical durable power of attorney   Patient Wishes to Receive Further Information on Advance Directives :   No   CHUBB, RN, ALMARIE POUR - 04/12/2021 2:15 EDT   Education   Written Language :   Isadora   Primary Language :   Isadora SLADE, RN, ALMARIE K - 04/12/2021 2:15 EDT   Caregiver/Advocate Language   Patient :   None   Family :   None   CHUBB, RN,  ELIZABETH K - 04/12/2021 2:15 EDT   Barriers to Learning :   None evident   Teaching Method :   Demonstration, Explanation   CHUBB, RN, ELIZABETH K - 04/12/2021 2:15 EDT   Preventative Measures Information   Unit/Room Orientation :   Verbalizes understanding   Environmental Safety :   Verbalizes understanding   Hand Washing :   Verbalizes understanding   Infection Prevention :   Verbalizes understanding   DVT Prophylaxis :   Verbalizes understanding   CHUBB, RN, Holiday representative K - 04/12/2021 2:15 EDT   DC Needs   CM Living Situation :   Acute Rehab   Home Caregiver Name/Relationship :   Patient   CHUBB, RN, ELIZABETH K - 04/12/2021 2:15 EDT   Valuables and Belongings   Does Patient Have Valuables and Belongings :   Yes   CHUBB, RN, ELIZABETH K - 04/12/2021 2:15 EDT   Valuables and Belongings   At Bedside :   Clothes   CHUBB, RN, ELIZABETH K - 04/12/2021 2:15 EDT   Patient Search Completed :   NA   CHUBB, RN, ELIZABETH K - 04/12/2021 2:15 EDT   Admission Complete   Admission Complete :   Yes   CHUBB, RN, ELIZABETH K - 04/12/2021 2:15 EDT

## 2021-04-12 NOTE — Anesthesia Pre-Procedure Evaluation (Signed)
Preanesthesia Evaluation        Patient:   Jesse Savage, Jesse Savage            MRN: [id number]            FIN: [id number]               Age:   60 years     Sex:  Male     DOB:  Feb 07, 1961   Associated Diagnoses:   None   Author:   Kingsley Plan JOSEPH-MD      Preoperative Information   NPO:  NPO greater than 8 hours.    Anesthesia history     Patient's history: negative.     Family's history: negative.        History of Present Illness   60 year old male presents for I&D of postoperative seroma      Health Status   Allergies:    Allergic Reactions (Selected)  Unknown  Codeine- Na.  Cymbalta- Na.  Neurontin- Na.   Current medications:    Home Medications (9) Active  acetaminophen 500 mg oral tablet 1,000 mg = 2 tabs, Oral, TID  amitriptyline 25 mg oral tablet 25 mg = 1 tabs, Oral, Once a Day (at bedtime)  docusate-senna 50 mg-8.6 mg oral tablet 1 tabs, Oral, BID  lactulose 10 g/15 mL oral syrup 20 g = 30 mL, PRN, Oral, Daily  Lidoderm 5% topical film 2 patches, Topical, Daily  MiraLax 17 g = 1 packets, Oral, Daily  oxyCODONE 5 mg oral tablet 10 mg = 2 tabs, PRN, Oral, q4hr  tiZANidine 4 mg oral tablet 4 mg = 1 tabs, Oral, TID  Valium 2 mg oral tablet 2 mg = 1 tabs, Oral, TID  ,    Medications (19) Active  Scheduled: (7)  acetaminophen 500 mg Tab  1,000 mg 2 tabs, Oral, TID  amitriptyline 25 mg Tab  25 mg 1 tabs, Oral, Once a Day (at bedtime)  diazePAM 2 mg Tab  2 mg 1 tabs, Oral, TID  hydrocortisone 1% Lotion 118 mL  1 app, Topical, TID  lidocaine 5% Topical Film  2 patches, Topical, Daily  polyethylene glycol 3350 Oral Powder for Recon  17 g 1 packets, Oral, Daily  tiZANidine 4 mg Tab  4 mg 1 tabs, Oral, TID  Continuous: (0)  PRN: (12)  aluminum hydroxide/magnesium hydroxide/simethicone 200 mg-200 mg-20 mg/5 mL  30 mL, Oral, q4hr  bisacodyl 10 mg Rectal Supp  10 mg 1 supp, PR, Daily  diphenhydrAMINE 25 mg Cap  25 mg 1 caps, Oral, q6hr  docusate-senna 50 mg-8.6 mg Tab  1 tabs, Oral, BID  lactulose 10 g/15 mL Oral Syrup 30  mL  20 g 30 mL, Oral, Daily  ondansetron 2 mg/mL Inj Soln 2 mL  4 mg 2 mL, IV Push, q6hr  ondansetron 4 mg Tab  4 mg 1 tabs, Oral, q6hr  oxyCODONE 5 mg Tab  10 mg 2 tabs, Oral, q4hr  promethazine 25 mg Rectal Supp  12.5 mg 0.5 supp, PR, q6hr  promethazine 25 mg Tab  12.5 mg 0.5 tabs, Oral, q6hr  promethazine 25 mg/mL Inj Soln 1 mL  12.5 mg 0.5 mL, IM, q6hr  simethicone 125 mg Chew Tab  125 mg 1 tabs, Oral, QID     Problem list:    Active Problems (5)  Acute myelopathy   Back pain   History of DVT of lower extremity   OSA (obstructive sleep apnea)   Sciatica  Histories   Past Medical History:    No active or resolved past medical history items have been selected or recorded.   Procedure history:    Posterior Cervical Laminectomy/Discectomy/Decompression SCIP on 04/01/2021 at 60 Years.  Comments:  04/01/2021 12:48 EDT - Etta Grandchild, RN, Brunilda Payor  auto-populated from documented surgical case  Lumbar Epidural Steroid Injection on 07/03/2020 at 59 Years.  Comments:  07/03/2020 13:59 EST - [name], RN, [name] A  auto-populated from documented surgical case  Si Joint Injection (Bilateral) on 07/03/2020 at 59 Years.  Comments:  07/03/2020 13:59 EST - [name], RN, [name] A  auto-populated from documented surgical case  Transforaminal Epidural Ster Inj on 05/31/2020 at 59 Years.  Comments:  05/31/2020 11:13 EST - PYLE, RN, JOYCE  auto-populated from documented surgical case  Si Joint Injection on 05/31/2020 at 59 Years.  Comments:  05/31/2020 11:13 EST - PYLE, RN, JOYCE  auto-populated from documented surgical case  Anterior Cervical Discectomy with Fusion and/or Stabilization SCIP on 08/26/2019 at 58 Years.  Comments:  08/26/2019 11:38 EDT - [name], RN, [name]  auto-populated from documented surgical case  Transforaminal Epidural Ster Inj on 06/29/2019 at 58 Years.  Comments:  06/29/2019 11:34 EST - [name], RN, [name] A  auto-populated from documented surgical case  Lumbar fusion L4-5 on 04/22/2011 at 50 Years.  redo lumbar fusion  L5-S1 in 2012 at 50 Years.  L5-S1 lumbar fusion in 2005 at 43 Years.  thumb surgery in 1995 at 33 Years.  facial surgery in 1981 at 19 Years.  tonsils and adenoids in 1972 at 10 Years.  Hernia (1478295621).  Tonsillectomy (308657846).  colonoscopy.   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        Physical Examination      Vital Signs (last 24 hrs)_____  Last Charted___________  Temp Oral     37.0 degC  (NOV 04 06:49)  Heart Rate Peripheral   78 bpm  (NOV 04 06:49)  Resp Rate         18 br/min  (NOV 04 06:49)  SBP      H  (NOV 04 06:49)  DBP      80 mmHg  (NOV 04 06:49)  SpO2      96 %  (NOV 04 06:49)     General:          Stress: No acute distress.         Appearance: Within normal limits.    Airway:       Mallampati classification: III (soft palate, base of uvula visible).    Respiratory:  Lungs are clear to auscultation, Breath sounds are equal.    Cardiovascular:  Normal rate, Regular rhythm.    Neurologic:  Alert, Oriented.       Review / Management   Results review:     No qualifying data available.       Assessment and Plan   American Society of Anesthesiologists#(ASA) physical status classification:  Class III.    Anesthetic Preoperative Plan     Anesthetic technique: General  anesthesia.     Maintenance airway: Oral endotracheal tube.     Risks discussed.       Signature Line     Electronically Signed on 04/12/2021 07:41 AM EDT   ________________________________________________   Jearld Adjutant, MD, Antony Madura

## 2021-04-12 NOTE — Op Note (Signed)
Phase I Record - [facility]             Phase I Record - [facility] Summary                                                                   Primary Physician:        Candi Leash C-MD    Case Number:              [id number]    Finalized Date/Time:      04/12/21 11:31:42    Pt. Name:                 Jesse Savage, Jesse Savage    D.O.B./Sex:               1960/07/24    Male    Med Rec #:                [id number]    Physician:                Candi Leash C-MD    Financial #:              [id number]    Pt. Type:                 I    Room/Bed:                 0509/01    Admit/Disch:              04/11/21 22:18:00 -    Institution:       [facility] Case Attendance - Phase I                                                                                            Entry 1                         Entry 2                                                                          Case Attendee             Alcide Goodness,  WARD C-MD         Milagros Evener, RN, SUZANNE R    Role Performed            Surgeon Primary                 Post Anesthesia Care  Nurse    Time In     Time Out     Last Modified By:         Milagros Evener RN, Rudi Coco, RNFoy [place] 04/12/21 10:33:18             R 04/12/21 11:31:30      [facility] Case Attendance - Phase I Audit                                                             04/12/21 11:31:30         Owner: RADASU                               Modifier: RADASU                                                        <+> 2         Case Attendee        <+> 2         Role Performed        [facility] Case Times - Phase I                                                                                                 Entry 1                                                                                                          Phase I In                04/12/21 10:19:00               Phase I Out                     04/12/21  11:22:00    Phase I Discharge         04/12/21 11:30:00    Time     Last Modified By:  Milagros Evener RNFoy [place] 04/12/21 11:31:42              Finalized By: Stana Bunting      Document Signatures                                                                             Signed By:           Warnell Forester R 04/12/21 11:31

## 2021-04-12 NOTE — Case Communication (Signed)
CM D/C Planning Assessment Ongoing- Text       CM Admission Assessment Entered On:  04-16-21 8:52 EDT    Performed On:  2021/04/16 8:48 EDT by Elie Confer M-RN               CM Admission Assessment   CM Reason for Care Management Referral :   Assess support system, Discharge planning assessment   CM Insurance Information :   Medicare, Private Insurance   CM Insurance Co. Name :   Medicare nad BCBS out of state   CM Name of Patient Pharmacy :   [facility]   CM Date and Time Inpatient Order :   04/11/2021 22:19 EDT   CM Date and Time Admission IM :   04-16-21 8:25 EDT   CM Date of 3rd MN Occurrence :   04/14/2021 EDT   CM Patient has PCP :   Yes   CM PCP Search :   Rudell Cobb   CM Assessment Discussion With :   Patient   CM Home/Lay Caregiver Name/Relationship :   spouse: Hadyn Blanck   [facility] Home/Lay Caregiver Contact Number :   [phone]   CM Living Situation :   Home with no services, Family support   CM Patient Admitted From :   Lives with his spouse   CM Veteran :   No   Current Equipment and Treatments :   Cane   CM Initial Tentative Discharge Plan :   home   CM Alternate Discharge Plan :   home with [facility]   Anticipated Hosp Related Barriers to DC :   None identified   CM Home Barriers :   None   Anticipated Discharge Date :   04/15/2021 EST   CM Progress Note :   04-17-2023: Patient admitted for cervical seroma. Patient is a/o and lives at home with his spouse. Patient has a cane at home. Patient has no HH.  Address and insurance verified. IM signed and copy given. PT/OT to eval. Awaiitng recommendations. CM to follow.(JLD)     CM Admission Assessment Complete :   Yes   Elie Confer M-RN - 04/16/2021 8:48 EDT   Z Codes   Z code documentation :   Not applicable   Elie Confer M-RN - 04-16-21 8:48 EDT

## 2021-04-12 NOTE — Progress Notes (Signed)
PT Inpatient Discharge Exam -Text       PT Inpatient Discharge Exam Entered On:  04/12/2021 11:49 EDT    Performed On:  04/11/2021 14:30 EDT by Wynona Canes A-PT               Reason for Treatment   Subjective Statement :   N/A - No visit DC      *Reason for Referral :   Dx: 60 yo s/p C5-6 bilateral laminectomy for decompression of spinal cord per Dr. Marlou Porch 10/24 c prior ACDF C5-C6 on 08/2019 for cervical myelopathy  Precautions: Cervical spine prec, soft c-collar PRN for pain, PAIN LIMITING FNX  PMHx: Prior ACDF,  Activity: 15 hr  TC date: Thursday - Perry Mount A-PT - 04/12/2021 11:44 EDT   PT Mobility   PT Mobility 2019   Roll Left :   Independent - Patient/Resident completes the activities by him/herself, with or without an assistive device, with no assistance from a helper. - 06   Roll Right :   Independent - Patient/Resident completes the activities by him/herself, with or without an assistive device, with no assistance from a helper. - 06   Roll Left and Right :   Independent - Patient/Resident completes the activities by him/herself, with or without an assistive device, with no assistance from a helper. - 06   Roll Prone :   Not attempted due to medical condition or safety concerns - 88   Roll Supine :   Supervision or touching assistance - Helper provides verbal cues and/or touching/steadying and/or contact guard assistance as patient/resident completes activity. Assistance may be provided throughout the activity or intermittently. - 04   Sit to Lying :   Partial/Moderate assistance - Helper does LESS THAN HALF the effort. Helper lifts, holds or supports trunk or limbs, but provides less than half the effort. - 03   Lying to Sitting on Side of Bed :   Partial/Moderate assistance - Helper does LESS THAN HALF the effort. Helper lifts, holds or supports trunk or limbs, but provides less than half the effort. - 03   Scooting :   Not attempted due to medical condition or safety concerns - 88    Sit to Stand :   Not attempted due to medical condition or safety concerns - 88   Stand to Sit :   Not attempted due to medical condition or safety concerns - 50   Chair, Bed to Chair Transfer :   Not attempted due to medical condition or safety concerns - 42   Car Transfer :   Not attempted due to medical condition or safety concerns - 88   Floor Recovery :   Not attempted due to medical condition or safety concerns - [address]   Wynona Canes A-PT - 04/12/2021 11:44 EDT   Independent - Patient/Resident completes the activities by him/herself, with or without an assistive device, with no assistance from a helper. - 06 :   Mobility grid reflect functional mobility with use of hospital bed rails and elevating HOB   Wynona Canes A-PT - 04/12/2021 11:44 EDT   PT Ambulation 2019   Walk 10 Feet :   Not attempted due to medical condition or safety concerns - 88   Walk 50 Feet with Two Turns :   Not attempted due to medical condition or safety concerns - 88   Walk 150 Feet :   Not attempted due to  medical condition or safety concerns - 88   Walking 10 Feet Uneven Surfaces :   Not attempted due to medical condition or safety concerns - 88   1 Step (Curb) :   Not attempted due to medical condition or safety concerns - 88   Ramp Ambulation :   Not attempted due to medical condition or safety concerns - 88   4 Steps :   Not attempted due to medical condition or safety concerns - 88   12 Steps :   Not attempted due to medical condition or safety concerns - 30   Picking Up Object :   Not attempted due to medical condition or safety concerns - [address]   Wynona Canes A-PT - 04/12/2021 11:44 EDT   Transfer Type :   Stand step   Ambulation Device Utilized :   Rolling walker   Distance Level Surface :   0 ft   PT Mobility Reviewed :   Yes   Wynona Canes A-PT - 04/12/2021 11:44 EDT   [facility]   Wheelchair Mobility   Wheel 50 Feet with Two Turns :   Not attempted due to medical condition or safety concerns - 88   Wheel 150 Feet :   Not attempted  due to medical condition or safety concerns - 88   Uneven Surfaces :   Not attempted due to medical condition or safety concerns - 36   Curbs :   Not attempted due to medical condition or safety concerns - 88   Ramps :   Not attempted due to medical condition or safety concerns - 24   Wheelies :   Not attempted due to medical condition or safety concerns - 88   Preparation :   Not attempted due to medical condition or safety concerns - [address]   Wynona Canes A-PT - 04/12/2021 11:44 EDT   PT Wheelchair Mobilitiy Reviewed Rehab :   Yes   Wynona Canes A-PT - 04/12/2021 11:44 EDT   Assessment   PT Impairments or Limitations :   Abnormal tone, Ambulation deficits, Balance deficits, Bed mobility deficits, Endurance deficits, Equipment training, Pain limiting function, Strength deficits, Transfer deficits, Transition deficits, Wheelchair mobility deficits   Barriers to Safe Discharge PT :   Medical diagnosis, Past medical history, Severity of deficits   Discharge Recommendations :   d/c home c spouse    OPPT vs HH    DME: ?          PT Treatment Recommendations :   Pt discharged to acute care due to pain limiting ability to participate in skilled physical therapy. Formal DC exam not completed. PT mobility scores pulled from last treatment session.      Wynona Canes A-PT - 04/12/2021 11:44 EDT   Short Term Goals   Bed Mobility Goal Grid     Goal #1          Descriptors :    Sit to supine, Supine to sit              Level :    Close supervision              Status :    Not met                Wynona Canes A-PT - 04/12/2021 11:44 EDT         Transfers Goal Grid     Goal #1  Goal #2  Descriptors :    Sit to stand, Stand to sit   Stand step           Level :    Moderate assistance   Moderate assistance           Device :    Rolling walker   Rolling walker           Status :    Not met   Not met             Wynona Canes A-PT - 04/12/2021 11:44 EDT  Wynona Canes A-PT - 04/12/2021 11:44 EDT        Ambulation Goal Grid     Goal #1           Descriptors :    Level surfaces              Level :    Moderate assistance              Distance/# of Steps :    15              Device :    Rolling walker              Status :    Not met                Wynona Canes A-PT - 04/12/2021 11:44 EDT         PT Balance Goal Grid     Goal #1          Descriptor :    Supported stand              Assist Level :    Close supervision              Type :    Static standing              Devices :    Rolling walker              Length of Time (minutes) :    3 minutes              Rationale :    Improve independence with activities of daily living              Status :    Not met                Wynona Canes A-PT - 04/12/2021 11:44 EDT         PT ST Goals Reviewed :   Yes   Wynona Canes A-PT - 04/12/2021 11:44 EDT   PT Long Term Goals   Outpatient PT Long Term Goals Rehab     Long Term Goal 1          Goal :    pt will complete TUG              Status :    Not met                Wynona Canes A-PT - 04/12/2021 11:44 EDT         Mobility Goal 2019   Roll Left and Right Goal :   Independent - Patient/Resident completes the activities by him/herself, with or without an assistive device, with no assistance from a helper. - 06   (Comment: NOT MET Wynona Canes A-PT - 04/12/2021 11:44 EDT] )   Sit to  Lying Goal :   Independent - Patient/Resident completes the activities by him/herself, with or without an assistive device, with no assistance from a helper. - 06   (Comment: NOT MET Wynona Canes A-PT - 04/12/2021 11:44 EDT] )   Lying to Sitting Side of Bed Goal :   Independent - Patient/Resident completes the activities by him/herself, with or without an assistive device, with no assistance from a helper. - 06   (Comment: NOT MET Wynona Canes A-PT - 04/12/2021 11:44 EDT] )   Sit to Stand Goal :   Independent - Patient/Resident completes the activities by him/herself, with or without an assistive device, with no assistance from a helper. - 06   (Comment: NOT MET Wynona Canes A-PT - 04/12/2021  11:44 EDT] )   [place] to Chair Transfer Goal :   Independent - Patient/Resident completes the activities by him/herself, with or without an assistive device, with no assistance from a helper. - 06   (Comment: NOT MET Wynona Canes A-PT - 04/12/2021 11:44 EDT] )   Car Transfer Goal :   Independent - Patient/Resident completes the activities by him/herself, with or without an assistive device, with no assistance from a helper. - 06   (Comment: NOT MET Wynona Canes A-PT - 04/12/2021 11:44 EDT] )   Wynona Canes A-PT - 04/12/2021 11:44 EDT   Walk Goals 2019   Walk 10 Feet Goal :   Independent - Patient/Resident completes the activities by him/herself, with or without an assistive device, with no assistance from a helper. - 06   (Comment: NOT MET Wynona Canes A-PT - 04/12/2021 11:44 EDT] )   Walk 50 Feet with Two Turns Goal :   Independent - Patient/Resident completes the activities by him/herself, with or without an assistive device, with no assistance from a helper. - 06   (Comment: NOT MET Wynona Canes A-PT - 04/12/2021 11:44 EDT] )   Walk 150 Feet Goal :   Independent - Patient/Resident completes the activities by him/herself, with or without an assistive device, with no assistance from a helper. - 06   (Comment: NOT MET Wynona Canes A-PT - 04/12/2021 11:44 EDT] )   Wynona Canes A-PT - 04/12/2021 11:44 EDT   Wheelchair Goals 2019   Wheel 50 Feet with Two Turns Goal :   Independent - Patient/Resident completes the activities by him/herself, with or without an assistive device, with no assistance from a helper. - 06   (Comment: NOT MET Wynona Canes A-PT - 04/12/2021 11:44 EDT] )   Wheel 150 feet Goal :   Independent - Patient/Resident completes the activities by him/herself, with or without an assistive device, with no assistance from a helper. - 06   (Comment: NOT MET  Wynona Canes A-PT - 04/12/2021 11:44 EDT] )   Wynona Canes A-PT - 04/12/2021 11:44 EDT   PT LTG Reconcilation :   Pt discharged to acute care due to pain  limiting ability to participate in skilled physical therapy. Formal DC exam not completed.    Type of Wheelchair Goal :   Manual wheelchair   PT LT Goals Reviewed :   Yes   Wynona Canes A-PT - 04/12/2021 11:44 EDT   Plan   PT Evaluation Date :   04/11/2021 10:30 EDT   Treatments Planned :   Canalith Repositioning, Caregiver training   Treatment Plan/Goals Established With Patient/Caregiver :   Yes   Evaluation Complete :  Yes   Wynona Canes A-PT - 04/12/2021 11:44 EDT   Time Spent With Patient   PT Therapeutic Exercise Time :   0 minutes   PT Total Individual Time Rehab :   0    PT Total Timed Code Treatment Minutes Rehab :   0    PT Total Treatment Time Rehab :   0 minutes   Wynona Canes A-PT - 04/12/2021 11:44 EDT   Section GG: Mobility Abilities   Section GG: Admission Mobility 2019   *Roll Left and Right :   Not attempted due to medical condition or safety concerns - 88   *Sit to Lying :   Not attempted due to medical condition or safety concerns - 88   *Lying to Sitting on Side of Bed :   Not attempted due to medical condition or safety concerns - 88   *Sit to Stand :   Not attempted due to medical condition or safety concerns - 88   *Chair,Bed to Chair Transfer :   Not attempted due to medical condition or safety concerns - 46   *Car Transfer :   Not attempted due to medical condition or safety concerns - [address]   Wynona Canes A-PT - 04/12/2021 11:44 EDT   [id number] Patient Walk :   No, and walking goal IS clinically indicated   Wynona Canes A-PT - 04/12/2021 11:44 EDT   Section GG: Admission Ambulation 2019   *Walk 10 Feet :   Not attempted due to medical condition or safety concerns - 88   *Walk 50 Feet with Two Turns :   Not attempted due to medical condition or safety concerns - 88   *Walk 150 Feet :   Not attempted due to medical condition or safety concerns - 88   *Walk 10 Feet Uneven Surfaces :   Not attempted due to medical condition or safety concerns - 88   *1 Step (Curb) :   Not attempted due to medical  condition or safety concerns - 54   *4 Steps :   Not attempted due to medical condition or safety concerns - 58   *12 Steps :   Not attempted due to medical condition or safety concerns - 88   *Picking Up Object :   Not attempted due to medical condition or safety concerns - [address]   Wynona Canes A-PT - 04/12/2021 11:44 EDT   [id number] Patient Use Wheelchair,Scooter :   No   Wynona Canes A-PT - 04/12/2021 11:44 EDT   Section GG: Wheelchair Mobility 2019   *Wheel 50 Feet with Two Turns :   Not attempted due to medical condition or safety concerns - 88   *Wheel 150 feet :   Not attempted due to medical condition or safety concerns - [address]   Wynona Canes A-PT - 04/12/2021 11:44 EDT

## 2021-04-12 NOTE — Assessment & Plan Note (Signed)
PreOp Record - [facility]             PreOp Record - [facility] Summary                                                                     Primary Physician:        Candi Leash C-MD    Case Number:              [id number]    Finalized Date/Time:      04/12/21 07:34:57    Pt. Name:                 Jesse Savage, Jesse Savage    D.O.B./Sex:               01-15-1961    Male    Med Rec #:                [id number]    Physician:                Candi Leash C-MD    Financial #:              [id number]    Pt. Type:                 I    Room/Bed:                 0509/01    Admit/Disch:              04/11/21 22:18:00 -    Institution:       [facility] Case Attendance - PreOp                                                                                              Entry 1                         Entry 2                                                                          Case Attendee             Alcide Goodness,  WARD C-MD         Jonne Ply J-RN    Role Performed            Surgeon Primary                 Preoperative Nurse    Time In     Time Out     Last Modified By:  Jonne Ply J-RN            Jonne Ply J-RN                              04/12/21 07:26:40               04/12/21 07:26:49      [facility] Case Attendance - PreOp Audit                                                               04/12/21 07:26:49         Owner: [id number]                               Modifier: [id number]                                                        <+> 2         Case Attendee        <+> 2         Role Performed        [facility] Case Times - PreOp                                                                                                   Entry 1                                                                                                          Patient In Room Time      04/12/21 07:22:00               Nurse In Time                   04/12/21 07:22:00    Nurse Out Time            04/12/21 07:34:00               Patient Ready  for               04/12/21 07:34:00  Surgery/Procedure     Last Modified By:         Jonne Ply J-RN                              04/12/21 07:34:49      [facility] Case Times - PreOp Audit                                                                    04/12/21 07:34:49         Owner: [id number]                               Modifier: [id number]                                                        <+> 1         Patient Ready for Surgery/Procedure        <+> 1         Nurse Out Time                Finalized By: Jonne Ply J-RN      Document Signatures                                                                             Signed By:           Jonne Ply J-RN 04/12/21 07:34

## 2021-04-12 NOTE — Anesthesia Post-Procedure Evaluation (Signed)
Postanesthesia Evaluation        Patient:   Jesse Savage, Jesse Savage            MRN: [id number]            FIN: [id number]               Age:   60 years     Sex:  Male     DOB:  Jan 22, 1961   Associated Diagnoses:   None   Author:   Kingsley Plan JOSEPH-MD      Postoperative Information   Post Operative Info:          Post operative day: Post Anesthesia Care Unit.         Patient location: PACU.       Assessment   Postanesthesia assessment   Vitals.     Respiratory function: Respiratory rate, airway, and oxygen saturation are at adequate levels.     Cardiovascular function: Heart Rate stable, Blood Pressure stable, Postoperative hydration status (Adequate, will treat HTN if BP remains elevated once pain is better controlled).     Mental status: appropriate for level of anesthesia.     Temperature: within normal limits.     Pain Control: Adequate, IV Dilaudid.     Nausea/Vomiting: Absent.       Signature Line     Electronically Signed on 04/12/2021 10:40 AM EDT   ________________________________________________   Jearld Adjutant, MD, Antony Madura

## 2021-04-13 MED ORDER — NORMAL SALINE FLUSH 0.9 % IV SOLN
0.9 % | Freq: Two times a day (BID) | INTRAVENOUS | Status: DC
Start: 2021-04-13 — End: 2021-04-14

## 2021-04-13 MED ORDER — ACETAMINOPHEN 325 MG PO TABS
325 MG | Freq: Three times a day (TID) | ORAL | Status: DC
Start: 2021-04-13 — End: 2021-04-14
  Administered 2021-04-14: 14:00:00 650 mg via ORAL

## 2021-04-13 MED ORDER — PROMETHAZINE HCL 12.5 MG RE SUPP
12.5 MG | Freq: Four times a day (QID) | RECTAL | Status: DC | PRN
Start: 2021-04-13 — End: 2021-04-14

## 2021-04-13 MED ORDER — ONDANSETRON HCL 4 MG PO TABS
4 MG | Freq: Four times a day (QID) | ORAL | Status: DC | PRN
Start: 2021-04-13 — End: 2021-04-14

## 2021-04-13 MED ORDER — SODIUM CHLORIDE 0.9 % IV SOLN
0.9 % | INTRAVENOUS | Status: DC | PRN
Start: 2021-04-13 — End: 2021-04-14

## 2021-04-13 MED ORDER — PROMETHAZINE HCL 25 MG RE SUPP
25 MG | Freq: Four times a day (QID) | RECTAL | Status: DC | PRN
Start: 2021-04-13 — End: 2021-04-14

## 2021-04-13 MED ORDER — ALUM & MAG HYDROXIDE-SIMETH 200-200-20 MG/5ML PO SUSP
200-200-20 MG/5ML | ORAL | Status: DC | PRN
Start: 2021-04-13 — End: 2021-04-14

## 2021-04-13 MED ORDER — HYDRALAZINE HCL 20 MG/ML IJ SOLN
20 MG/ML | Freq: Four times a day (QID) | INTRAMUSCULAR | Status: DC | PRN
Start: 2021-04-13 — End: 2021-04-14

## 2021-04-13 MED ORDER — LIDOCAINE 4 % EX PTCH
4 % | Freq: Every day | CUTANEOUS | Status: DC
Start: 2021-04-13 — End: 2021-04-14

## 2021-04-13 MED ORDER — OXYCODONE HCL 5 MG PO TABS
5 MG | ORAL | Status: DC | PRN
Start: 2021-04-13 — End: 2021-04-14

## 2021-04-13 MED ORDER — NORMAL SALINE FLUSH 0.9 % IV SOLN
0.9 % | INTRAVENOUS | Status: DC | PRN
Start: 2021-04-13 — End: 2021-04-14

## 2021-04-13 MED ORDER — SODIUM CHLORIDE 0.45 % IV SOLN
0.45 % | INTRAVENOUS | Status: DC
Start: 2021-04-13 — End: 2021-04-14

## 2021-04-13 MED ORDER — OXYCODONE HCL 5 MG PO TABS
5 MG | ORAL | Status: DC | PRN
Start: 2021-04-13 — End: 2021-04-14
  Administered 2021-04-14 (×4): 10 mg via ORAL

## 2021-04-13 MED ORDER — AMITRIPTYLINE HCL 25 MG PO TABS
25 MG | Freq: Every evening | ORAL | Status: DC
Start: 2021-04-13 — End: 2021-04-14

## 2021-04-13 MED ORDER — SENNA-DOCUSATE SODIUM 8.6-50 MG PO TABS
8.6-50 MG | Freq: Two times a day (BID) | ORAL | Status: DC | PRN
Start: 2021-04-13 — End: 2021-04-14

## 2021-04-13 MED ORDER — PHENOL 1.4 % MT LIQD
1.4 % | OROMUCOSAL | Status: DC | PRN
Start: 2021-04-13 — End: 2021-04-14

## 2021-04-13 MED ORDER — NORMAL SALINE FLUSH 0.9 % IV SOLN
0.9 % | Freq: Two times a day (BID) | INTRAVENOUS | Status: DC
Start: 2021-04-13 — End: 2021-04-14
  Administered 2021-04-14: 14:00:00 10 mL via INTRAVENOUS

## 2021-04-13 MED ORDER — LIDOCAINE HCL URETHRAL/MUCOSAL 2 % EX PRSY
2 % | CUTANEOUS | Status: DC | PRN
Start: 2021-04-13 — End: 2021-04-14

## 2021-04-13 MED ORDER — SIMETHICONE 125 MG PO CHEW
125 MG | Freq: Four times a day (QID) | ORAL | Status: DC | PRN
Start: 2021-04-13 — End: 2021-04-14

## 2021-04-13 MED ORDER — POLYETHYLENE GLYCOL 3350 17 G PO PACK
17 g | Freq: Every day | ORAL | Status: DC
Start: 2021-04-13 — End: 2021-04-14
  Administered 2021-04-14: 14:00:00 17 g via ORAL

## 2021-04-13 MED ORDER — HYDROCORTISONE ACETATE 1 % EX CREA
1 % | Freq: Three times a day (TID) | CUTANEOUS | Status: DC
Start: 2021-04-13 — End: 2021-04-13

## 2021-04-13 MED ORDER — BISACODYL 10 MG RE SUPP
10 MG | Freq: Every day | RECTAL | Status: DC | PRN
Start: 2021-04-13 — End: 2021-04-14
  Administered 2021-04-14: 18:00:00 10 mg via RECTAL

## 2021-04-13 MED ORDER — HYDROCORTISONE 1 % EX OINT
1 % | Freq: Three times a day (TID) | CUTANEOUS | Status: DC
Start: 2021-04-13 — End: 2021-04-14

## 2021-04-13 MED ORDER — DIPHENHYDRAMINE HCL 25 MG PO CAPS
25 MG | Freq: Four times a day (QID) | ORAL | Status: DC | PRN
Start: 2021-04-13 — End: 2021-04-14

## 2021-04-13 MED ORDER — GLYCERIN (LAXATIVE) 2 G RE SUPP
2 g | Freq: Once | RECTAL | Status: DC
Start: 2021-04-13 — End: 2021-04-14

## 2021-04-13 MED ORDER — DIAZEPAM 5 MG PO TABS
5 MG | Freq: Three times a day (TID) | ORAL | Status: DC
Start: 2021-04-13 — End: 2021-04-14
  Administered 2021-04-14: 14:00:00 5 mg via ORAL

## 2021-04-13 MED ORDER — LACTULOSE 10 GM/15ML PO SOLN
1015 GM/15ML | Freq: Every day | ORAL | Status: DC | PRN
Start: 2021-04-13 — End: 2021-04-14

## 2021-04-13 MED ORDER — TIZANIDINE HCL 4 MG PO TABS
4 MG | Freq: Three times a day (TID) | ORAL | Status: DC
Start: 2021-04-13 — End: 2021-04-14
  Administered 2021-04-14: 14:00:00 4 mg via ORAL

## 2021-04-13 MED ORDER — ONDANSETRON HCL 4 MG/2ML IJ SOLN
4 MG/2ML | Freq: Four times a day (QID) | INTRAMUSCULAR | Status: DC | PRN
Start: 2021-04-13 — End: 2021-04-14

## 2021-04-13 MED ORDER — PROMETHAZINE HCL 25 MG PO TABS
25 MG | Freq: Four times a day (QID) | ORAL | Status: DC | PRN
Start: 2021-04-13 — End: 2021-04-14

## 2021-04-13 MED ORDER — MORPHINE SULFATE 4 MG/ML IV SOLN
4 MG/ML | INTRAVENOUS | Status: DC | PRN
Start: 2021-04-13 — End: 2021-04-14

## 2021-04-13 MED FILL — SODIUM CHLORIDE 0.45 % IV SOLN: 0.45 % | INTRAVENOUS | Qty: 1000

## 2021-04-13 NOTE — Care Coordination-Inpatient (Signed)
CM Admission Assessment   CM Reason for Care Management Referral :   Assess support system, Discharge planning assessment   CM Insurance Information :   Medicare, Private Insurance   [employer] Co. Name :   Medicare nad BCBS out of state   CM Name of Patient Pharmacy :   [facility]   CM Date and Time Inpatient Order :   04/11/2021 22:19 EDT   CM Date and Time Admission IM :   04/12/2021 8:25 EDT   CM Date of 3rd [place] Occurrence :   04/14/2021 EDT   CM Patient has PCP :   Yes   CM PCP Search :   Rudell Cobb   CM Assessment Discussion With :   Patient   CM Home/Lay Caregiver Name/Relationship :   spouse: Zacchaeus Halm   [facility] Home/Lay Caregiver Contact Number :   [phone]   CM Living Situation :   Home with no services, Family support   CM Patient Admitted From :   Lives with his spouse   CM Veteran :   No   Current Equipment and Treatments :   Cane   CM Initial Tentative Discharge Plan :   home   CM Alternate Discharge Plan :   home with [facility]   Anticipated Hosp Related Barriers to DC :   None identified   CM Home Barriers :   None   Anticipated Discharge Date :   04/15/2021 EST   CM Progress Note :   11/04: Patient admitted for cervical seroma. Patient is a/o and lives at home with his spouse. Patient has a cane at home. Patient has no HH.  Address and insurance verified. IM signed and copy given. PT/OT to eval. Awaiitng recommendations. CM to follow.(JLD)     CM Admission Assessment Complete :   Yes   Elie Confer M-RN - 04/12/2021 8:48 EDT

## 2021-04-13 NOTE — Progress Notes (Signed)
Inpatient PT Examination - Text       Inpatient PT Examination Entered On:  04/13/2021 10:27 EDT    Performed On:  04/13/2021 10:27 EDT by Margo Aye, PT, LAURA A               Reason for Treatment   Subjective Statement :   Pt agreeable to OOB/amb "I'm not going back to [facility], it was awful"     *Reason for Referral :   s/p C5-6 bilateral laminectomy for decompression of spinal cord per Dr. 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. Now s/p s/p incision and drainage of post operative seroma of the posterior cervical spine    Precautions: Cervical spine prec, soft c-collar PRN for pain, PAIN LIMITING FNX       HALL, PT, LAURA A - 04/13/2021 10:27 EDT   General Info   Physical Therapy Orders :   PT Evaluation and Treatment Acute - 04/12/21 11:48:00 EDT, Stop date 04/12/21 11:48:00 EDT, A consult cannot be completed if there is a bedrest activity order; check for bedrest order.     Precautions RTF :    Notify Provider, 04/12/21 11:48:00 EDT, if patient unable to void, bladder scan residual volume is greater than , and indwelling foley placed, 04/12/21 11:48:00 EDT, 04/12/21 11:48:00 EDT, Ordered   Notify Provider, 04/12/21 11:48:00 EDT, Any Change In Patient's Neurological Status, 04/12/21 11:48:00 EDT, 04/12/21 11:48:00 EDT, Ordered   Notify Provider, 04/12/21 11:48:00 EDT, If indwelling catheter is placed due to inability to void and bladder scan volume = after 1st in and out catheterization, 04/12/21 11:48:00 EDT, 04/12/21 11:48:00 EDT, Ordered   Notify Provider, 04/12/21 11:48:00 EDT, If the patient has symptoms that are unrelieved by in and out catheterization or has bleeding at any time, notify the physician for additional orders., 04/12/21 11:48:00 EDT, 04/12/21 11:48:00 EDT, Ordered   Notify Provider Vital Signs, 04/12/21 11:48:00 EDT, SBP greater than 180, SBP less than 90, 04/12/21 11:48:00 EDT, Ordered   Notify Provider Vital Signs, 04/12/21 11:48:00 EDT, RR greater  than 24, RR less than 10, 04/12/21 11:48:00 EDT, Ordered   Notify Provider Vital Signs, 04/12/21 11:48:00 EDT, O2 sat less than 92, 04/12/21 11:48:00 EDT, Ordered   Notify Provider Vital Signs, 04/12/21 11:48:00 EDT, Notify Provider Vital Signs for Temp greater than 101.5 degree F, 38.6 degree C, 04/12/21 11:48:00 EDT, Ordered   Notify Provider Vital Signs, 04/12/21 11:48:00 EDT, HR greater than 120, HR less than 60, 04/12/21 11:48:00 EDT, Ordered   Notify Rapid Response Team, 04/12/21 11:48:00 EDT, For concerns regarding patient condition & notify MD, 04/12/21 11:48:00 EDT, 04/12/21 11:48:00 EDT, Ordered   Notify Anesthesia, 04/12/21 11:30:00 EDT, After monitoring complete, notify anesthesiologist for discharge orders., 04/12/21 11:30:00 EDT, 04/12/21 11:30:00 EDT, Ordered   Communication to Nursing, 04/12/21 10:37:00 EDT, Discharge: see comments, 04/12/21 10:37:00 EDT, 04/12/21 10:37:00 EDT, Ordered   Notify Provider, 04/12/21 10:37:00 EDT, abnormal CXR result, 04/12/21 10:37:00 EDT, 04/12/21 10:37:00 EDT, Ordered   Notify Provider Laboratory Results, 04/12/21 10:37:00 EDT, abnormal lab results, 04/12/21 10:37:00 EDT, Ordered   Notify Provider, 04/11/21 22:18:00 EDT, Any Change In Patient's Neurological Status, 04/11/21 22:18:00 EDT, 04/11/21 22:18:00 EDT, Ordered   Notify Provider, 04/11/21 22:18:00 EDT, if patient unable to void, bladder scan residual volume is greater than , and indwelling foley placed, 04/11/21 22:18:00 EDT, 04/11/21 22:18:00 EDT, Ordered   Notify Provider Laboratory Results, 04/11/21 22:18:00 EDT, Blood glucose less than 70 or greater than 200 mg/dL (  x 2 consecutive measurements), 04/11/21 22:18:00 EDT, Ordered   Notify Provider Vital Signs, 04/11/21 22:18:00 EDT, UO less than 120 mL per 4hrs, 04/11/21 22:18:00 EDT, Ordered   Notify Provider Vital Signs, 04/11/21 22:18:00 EDT, O2 sat less than 92, 04/11/21 22:18:00 EDT, Ordered   Notify Provider Vital Signs, 04/11/21 22:18:00 EDT, RR  greater than 24, RR less than 10, 04/11/21 22:18:00 EDT, Ordered   Notify Provider Vital Signs, 04/11/21 22:18:00 EDT, HR greater than 120, HR less than 60, 04/11/21 22:18:00 EDT, Ordered   Notify Provider Vital Signs, 04/11/21 22:18:00 EDT, Temp greater than 38.3, 04/11/21 22:18:00 EDT, Ordered   Notify Provider Vital Signs, 04/11/21 22:18:00 EDT, SBP greater than 180, SBP less than 90, 04/11/21 22:18:00 EDT, Ordered   Notify Rapid Response Team, 04/11/21 22:18:00 EDT, For concerns regarding patient condition & notify MD, 04/11/21 22:18:00 EDT, 04/11/21 22:18:00 EDT, Ordered     Orientation Assessment :   Oriented x 4   Affect/Behavior :   Appropriate   Basic Command Following :   Intact   Safety/Judgment :   Intact   Pain Present :   Yes actual or suspected pain   HALL, PT, LAURA A - 04/13/2021 10:29 EDT   Problem List   (As Of: 04/13/2021 10:35:59 EDT)   Problems(Active)    Acute myelopathy (SNOMED CT  :9147829562 )  Name of Problem:   Acute myelopathy ; Recorder:   LEWEY,  JENNIFER L-FNP; Confirmation:   Confirmed ; Classification:   Medical ; Code:   [id number] ; Contributor System:   Dietitian ; Last Updated:   08/24/2019 9:04 EDT ; Life Cycle Status:   Active ; Responsible Provider:   Demetrius Revel L-FNP; Vocabulary:   SNOMED CT        Back pain (SNOMED CT  :962952841 )  Name of Problem:   Back pain ; Recorder:   PYLE, RN, JOYCE; Confirmation:   Confirmed ; Classification:   Patient Stated ; Code:   [id number] ; Contributor System:   Dietitian ; Last Updated:   06/28/2019 11:25 EST ; Life Cycle Date:   06/28/2019 ; Life Cycle Status:   Active ; Vocabulary:   SNOMED CT        History of DVT of lower extremity (SNOMED CT  :2536644034 )  Name of Problem:   History of DVT of lower extremity ; Recorder:   Demetrius Revel L-FNP; Confirmation:   Confirmed ; Classification:   Medical ; Code:   [id number] ; Contributor System:   Dietitian ; Last Updated:   03/21/2021 11:03 EDT ; Life Cycle Date:   03/21/2021 ; Life  Cycle Status:   Active ; Vocabulary:   SNOMED CT        OSA (obstructive sleep apnea) (SNOMED CT  :564332951 )  Name of Problem:   OSA (obstructive sleep apnea) ; Recorder:   PYLE, RN, JOYCE; Confirmation:   Confirmed ; Classification:   Patient Stated ; Code:   [id number] ; Contributor System:   PowerChart ; Last Updated:   06/28/2019 11:25 EST ; Life Cycle Date:   06/28/2019 ; Life Cycle Status:   Active ; Vocabulary:   SNOMED CT        Sciatica (SNOMED CT  :01601093 )  Name of Problem:   Sciatica ; Recorder:   PYLE, RN, JOYCE; Confirmation:   Confirmed ; Classification:   Patient Stated ; Code:   [id number] ; Contributor System:   PowerChart ; Last Updated:   06/28/2019 11:25 EST ;  Life Cycle Date:   06/28/2019 ; Life Cycle Status:   Active ; Vocabulary:   SNOMED CT          Diagnoses(Active)    Unsteadiness on feet  Date:   04/13/2021 ; Diagnosis Type:   Other ; Confirmation:   Differential ; Clinical Dx:   Unsteadiness on feet ; Classification:   Interdisciplinary ; Clinical Service:   Non-Specified ; Code:   ICD-10-CM ; Probability:   0 ; Diagnosis Code:   R26.81        Pain Assessment   Pain Present :   Yes actual or suspected pain   Pain Present :   Neck   HALL, PT, LAURA A - 04/13/2021 10:29 EDT   Pain Assessment Detail   Interventions Implemented :   Repositioning, Rest   HALL, PT, LAURA A - 04/13/2021 10:29 EDT   Home Environment   Living Environment :   Home Environment  Sensory Deficits:  None  Performed By:  CHUBB, RN, ELIZABETH K 04/12/2021     Lives With :   Family   Lives In :   Single level home   [place], [place], [place] A - 04/13/2021 10:29 EDT   Stairs     Outside Stairs          Number of Stairs :    3                 HALL, PT, LAURA A - 04/13/2021 10:29 EDT         Home Environment II   Living Environment :   Home Environment  Sensory Deficits:  None  Performed By:  CHUBB, RN, Ashby Dawes 04/12/2021     HALL, PT, LAURA A - 04/13/2021 10:29 EDT   Prior Functional Status   ADL :   Independent   Mobility :    [place], PT, LAURA A - 04/13/2021 10:29 EDT   Additional Information :   pt was admitted from [facility], PT, LAURA A - 04/13/2021 10:29 EDT   LE Range/Strength   LE Overall Range of Motion Grid   Left Lower Extremity Passive Range :   Within functional limits   Left Lower Extremity Active Range :   Within functional limits   Right Lower Extremity Passive Range :   Within functional limits   Right Lower Extremity Active Range :   Within functional limits   HALL, PT, LAURA A - 04/13/2021 10:29 EDT   Mobility   Mobility Grid   Transfer Sit to Stand :   1, Rehab Minimal assistance   Transfer Stand to Sit :   1, Rehab Minimal assistance   HALL, PT, LAURA A - 04/13/2021 10:29 EDT   Ambulation Level Acute :   Minimal assistance   Ambulation Quality :   slow cadence, no major LOB, slight forward lean, no major LOB   Ambulation Distance :   150 ft   Device :   Rolling walker   HALL, PT, LAURA A - 04/13/2021 10:29 EDT   Balance   Balance Tests Performed :   Other: good sitting and good-fair standing balance   HALL, PT, LAURA A - 04/13/2021 10:29 EDT   AM-PAC Basic Mobility   AM-PAC Basic Mobility   Turning from your back on your side while in a flat bed without using bedrails? :   A little = 3 points   Moving from lying on your back to  sitting on the side of a flat bed without using bedrails? :   A little = 3 points   Moving to and from a bed to a chair (including a wheelchair)? :   A little = 3 points   Standing up from a chair using your arms (e.g. wheelchair or bedside chair)? :   A little = 3 points   To walk in hospital room? :   A little = 3 points   Climbing 3-5 steps with a railing?* :   A little = 3 points   HALL, PT, LAURA A - 04/13/2021 10:29 EDT   AM-PAC Mobility Raw Score :   18    HALL, PT, LAURA A - 04/13/2021 10:29 EDT   Assessment   PT Impairments or Limitations :   Ambulation deficits, Balance deficits, Bed mobility deficits, Pain limiting function, Strength deficits, Transfer deficits    Discharge Recommendations :   eval: pending pt progress     D/CTransportation Recommendations :   No stretcher   D/C Transportation Recommendations Reviewed :   Yes   PT Treatment Recommendations :   Pt has impairments with functional mobility and ambulation. Pt would benefit from acute skilled PT services for bed mobility, transfer and gait training. Rec for d/c pending pt progress     HALL, PT, LAURA A - 04/13/2021 10:29 EDT   Education   Home Caregiver Name/Relationship :   Patient   Stark Klein - 04/13/2021 10:29 EDT   Physical Therapy Education Grid   Physical Therapy Plan of Care :   Verbalizes understanding   Castor, PT, LAURA A - 04/13/2021 10:29 EDT   Long Term Goals   PT LT Goals Reviewed :   Rodman Key, PT, LAURA A - 04/13/2021 10:29 EDT   Outpatient PT Long Term Goals Rehab     Long Term Goal 1  Long Term Goal 2  Long Term Goal 3      Goal :    ind with supine to sit to stand   ind with amb 325ft with RW   ind with asend/desend 4 steps with rail        Comments :    8 days from 04/13/21                [place], PT, LAURA A - 04/13/2021 10:29 EDT  HALL, PT, LAURA A - 04/13/2021 10:29 EDT  HALL, PT, LAURA A - 04/13/2021 10:29 EDT       Short Term Goals   PT ST Goals Reviewed :   Yes   HALL, PT, LAURA A - 04/13/2021 10:29 EDT   Other PT Goals Grid     Goal #1  Goal #2  Goal #3      Goal :    SBA supine to sit   SBA sit to stand   CGA for amb 351ft with RW        Comments :    4 days from 04/13/21                HALL, PT, LAURA A - 04/13/2021 10:29 EDT  HALL, PT, LAURA A - 04/13/2021 10:29 EDT  HALL, PT, LAURA A - 04/13/2021 10:29 EDT       Plan   PT Frequency Acute :   Daily   Duration :   30    PT Duration Unit Rehab :   Days   Treatments Planned :   Balance  training, Bed mobility training, Functional training, Gait training, Stair training, Therapeutic activities, Therapeutic exercises, Transfer training   Treatment Plan/Goals Established With Patient/Caregiver :   Yes   Other PT Treatment Provided :   pt will be  seen 5-7 days per week for PT   Evaluation Complete :   Yes   HALL, PT, LAURA A - 04/13/2021 10:29 EDT   Time Spent With Patient   PT Individual Eval Time, Moderate Complexity :   15 minutes   PT Evaluation Units, Moderate Complexity :   1 Unit   PT Treatment Time Comment :   sit to stand, amb in hallway, left pt sitting up in chair with needs in reach and nsg updated + alarm     PT Functional Training Units :   1 units   PT Functional Training Time :   10 minutes   PT Total Timed Code Treatment Units :   1 units   PT Total Timed Code Min :   10    PT Total Untimed Min :   15    PT Total Treatment Time Acute/OP :   25    HALL, PT, LAURA A - 04/13/2021 10:29 EDT

## 2021-04-14 MED ORDER — LIDOCAINE 5 % EX PTCH
5 % | Freq: Every day | CUTANEOUS | Status: DC
Start: 2021-04-14 — End: 2021-04-14

## 2021-04-14 MED FILL — OXYCODONE HCL 5 MG PO TABS: 5 MG | ORAL | Qty: 2

## 2021-04-14 MED FILL — BD POSIFLUSH 0.9 % IV SOLN: 0.9 % | INTRAVENOUS | Qty: 10

## 2021-04-14 MED FILL — TIZANIDINE HCL 4 MG PO TABS: 4 MG | ORAL | Qty: 1

## 2021-04-14 MED FILL — BISACODYL 10 MG RE SUPP: 10 MG | RECTAL | Qty: 1

## 2021-04-14 MED FILL — MIRALAX 17 G PO PACK: 17 g | ORAL | Qty: 1

## 2021-04-14 MED FILL — DIAZEPAM 5 MG PO TABS: 5 MG | ORAL | Qty: 1

## 2021-04-14 MED FILL — ACETAMINOPHEN 325 MG PO TABS: 325 MG | ORAL | Qty: 2

## 2021-04-14 NOTE — Progress Notes (Deleted)
Pt armband not scanning into system. FSBG 140 at 1130.

## 2021-04-14 NOTE — Discharge Summary (Signed)
DISCHARGE SUMMARY    Admission Date : 04/11/2021  Discharge Date : 04/14/2021  Admitting Diagnosis : Post operative seroma  Discharge Diagnosis : same as above  Consultations : PT, OT    Chief Complaint : severe bilateral posterior neck/shoulder and upper back pain    History of Present Illness : Please see the medical record for full details. Jesse Savage is a 60 year old male with a PMH chronic back pain, OSA and s/p ACDF at C5-6 with Dr. Alcide Goodness on 08/26/2019 for cervical myelopathy. He underwent a posterior cervical decompression at C5-6 for ongoing myelopathy on 04/01/21 and did well in the initial post operative phase however he did have moderately severe musculoskeletal pain in the posterior shoulders, neck and upper back. He was discharged to [facility] on 10/27 and continued to have ongoing and worsening post operative pain prohibiting him from being able to participate in therapy. He c/o severe pain across the upper back and shoulders and was unable to even sit up in bed without having severe pain despite being on pain medication. A CT scan of the soft tissue of the neck was completed on 10/28 which showed a large fluid collection at the surgical site, seroma vs. pseudomeningocele. A subsequent cervical MRI showed the fluid collection resulting in mild cord deformity at the level of the decompression. IR drainage of the fluid collection was performed on 11/2 at which time approximately 100 ml of serous fluid was aspirated and a culture was sent to the lab. The patient experienced a transient improvement in his pain for approximately 12 hours however the severity quickly returned. A repeat cervical MRI and thoracic MRI were completed on 11/2 which showed unchanged fluid collection at the surgical site with moderate to moderately severe cord deformity. The thoracic MRI showed a T4/T5, disc/osteophyte complex left recess, mild left ventral cord deformity. Given the progression of symptoms and the failure  of conservative treatment patient elected to undergo operative intervention in the form of a incision and drainage for post operative seroma of the posterior cervical spine.    Hospital and Postoperative Course : This patient was observed briefly in the PACU and transferred postoperatively to the Neurospine Unit for the reminder of hospitalization. On day of discharge pain was well controlled, the patient was ambulating, and voiding freely without difficulty. Tolerating a regular diet. Patient was deemed acceptable for discharge. Please refer to the progress notes for detailed physical examination on the day of discharge.    Discharge Medications: Please see the medication reconciliation.    Discharge Instructions: The patient was given comprehensive instructions regarding the routine care of dressing and incision. Patient was instructed to avoid driving, heavy lifting greater than 10 lbs, and to avoid any excessive bending or twisting of the neck or back. The patient was instructed to avoid any bending, stooping, or vigorous activity. Patient was instructed to exercise daily in the form of light walking. Patient was instructed to call with any questions, concerns, wound redness, fever, any new numbness, tingling, or weakness.    Follow-up : The patient was given a follow-up appointment to see me back in the office in 2 weeks.

## 2021-04-14 NOTE — Discharge Instructions (Signed)
Cervical Discectomy: What to Expect at Home  Your Recovery       You can expect your neck to feel stiff or sore. This should improve in the weeks after surgery. You may have trouble sitting or standing in one position for very long. It usually takes a couple of weeks before you can get back to doing simple activities like light housework, office work, or longer walks. It may take a few months or longer before you can go back to a job that requires heavy labor or back to contact sports or activities where you could fall. How long it takes may depend on what kind of surgery you had and the type of work you do.  This care sheet gives you a general idea about how long it will take for you to recover. But each person recovers at a different pace. Follow the steps below to get better as quickly as possible.  How can you care for yourself at home?  Activity    Rest when you feel tired. Getting enough sleep will help you recover.     Try to walk each day. Start by walking a little more than you did the day before. Bit by bit, increase the amount you walk. Walking boosts blood flow and helps prevent pneumonia and constipation. Walking may also decrease your muscle soreness after surgery.     Avoid lifting anything that would make you strain. This may include grocery bags and milk containers, a heavy briefcase or backpack, cat litter or dog food bags, a vacuum cleaner, or a child.     Avoid strenuous activities, such as bicycle riding, jogging, weightlifting, or aerobic exercise, until your doctor says it is okay.     Ask your doctor when you can drive again.     Avoid taking long car trips for 2 to 4 weeks after surgery. Your neck may become tired and painful from sitting too long in one position.     Your time off from work depends on how quickly you feel better and on the type of work you do. If you work in an office, you likely can go back to work sooner than if you have a job where you are very active. Talk with  your doctor about your work needs.     You may have sex as soon as you feel able, but avoid positions that put stress on your neck or cause pain.   Diet    You can eat your normal diet. If your stomach is upset, try bland, low-fat foods like plain rice, broiled chicken, toast, and yogurt.     Drink plenty of fluids (unless your doctor tells you not to).     You may notice that your bowel movements are not regular right after your surgery. This is common. Try to avoid constipation and straining with bowel movements. You may want to take a fiber supplement every day. If you have not had a bowel movement after a couple of days, ask your doctor about taking a mild laxative.   Medicines    Be safe with medicines. Take pain medicines exactly as directed.  If the doctor gave you a prescription medicine for pain, take it as prescribed.  If you are not taking a prescription pain medicine, ask your doctor if you can take an over-the-counter medicine.     If you think your pain medicine is making you sick to your stomach:  Take your medicine after meals (unless  your doctor has told you not to).  Ask your doctor for a different pain medicine.   Incision care         Wash the area daily by showering with warm, soapy water, and pat it dry. Don't use hydrogen peroxide or alcohol, which can slow healing. Leave site open to air if possible.     Keep the area clean and dry.   Exercise    Do exercises as instructed by your doctor or physical therapist to improve your strength and flexibility.   Other instructions    Follow your doctor's instructions if you are told to wear a brace or collar to support your neck.        Follow-up care is a key part of your treatment and safety. Be sure to make and go to all appointments, and call your doctor if you are having problems. It's also a good idea to know your test results and keep a list of the medicines you take.  When should you call for help?   Call 911 anytime you think you may need  emergency care. For example, call if:    You passed out (lost consciousness).     You have chest pain, are short of breath, or cough up blood.     You are unable to move an arm or a leg at all.   Call your doctor now or seek immediate medical care if:    You have pain that does not get better after you take pain medicine.     You have loose stitches, or your incision comes open.     Bright red blood has soaked through the bandage over your incision.     You have signs of a blood clot in your leg (called a deep vein thrombosis), such as:  Pain in your calf, back of the knee, thigh, or groin.  Redness or swelling in your leg.     You have signs of infection, such as:  Increased pain, swelling, warmth, or redness.  Red streaks leading from the incision.  Pus draining from the incision.  A fever.     You have new or worse symptoms in your arms, legs, chest, belly, or buttocks. Symptoms may include:  Numbness or tingling.  Weakness.  Pain.     You lose bladder or bowel control.   Watch closely for any changes in your health, and be sure to contact your doctor if:    You do not get better as expected.   Where can you learn more?  Go to [email or web address] and sign in to your MyChart account. Enter R489 in the Search Health Information box to learn more about "Cervical Discectomy: What to Expect at Home."     If you do not have an account, please click on the "Sign Up Now" link.  Current as of: August 15, 2020               Content Version: 13.4  ?? 2006-2022 [employer].   Care instructions adapted under license by [facility]. If you have questions about a medical condition or this instruction, always ask your healthcare professional. [employer] disclaims any warranty or liability for your use of this information.

## 2021-04-14 NOTE — Discharge Instructions (Addendum)
Continuity of Care Form    Patient Name: Jesse Savage   DOB:  [birth year]  MRN:  [id number]    Admit date:  04/11/2021  Discharge date:  04/14/21    Code Status Order: Full Code   Advance Directives:     Admitting Physician:  Augusto Gamble, MD  PCP: Fulton Mole    Discharging Nurse: [facility] Unit/Room#: 0509/01  Discharging Unit Phone Number: [phone]    Emergency Contact:   Extended Emergency Contact Information  Primary Emergency Contact: [facility]  Mobile Phone: [phone]  Relation: Spouse  Preferred language: English  Interpreter needed? No    Past Surgical History:  Past Surgical History:   Procedure Laterality Date    BACK SURGERY  06/2010    L5-S1 REMOVAL OF SCREWS    CERVICAL SPINE SURGERY  08/26/2019    C5-C6 ACDF    FACIAL SURGERY  1981    HERNIA REPAIR  2002    LUMBAR FUSION  04/22/2011    L4-L5 PLIF USING DePUY SPACER, BMP, AUTOGRAFT, LATERAL FUSION USING SYNTHES SCREWS    THUMB ARTHROSCOPY  1995    TONSILLECTOMY AND ADENOIDECTOMY  1972       Immunization History:     There is no immunization history on file for this patient.    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    Acute midline thoracic back pain M54.6    Osteophyte, vertebrae M25.78    Pain in left leg M79.605    Cervical stenosis of spinal canal M48.02       Isolation/Infection:   Isolation            No Isolation          Patient Infection Status       None to display            Nurse Assessment:  Last Vital Signs: BP 118/79    Pulse 97    Temp 98.8 ??F (37.1 ??C)  (Oral)    Resp 18    SpO2 97%     Last documented pain score (0-10 scale): Pain Level: 5  Last Weight:   Wt Readings from Last 1 Encounters:   02/20/21 170 lb (77.1 kg)     Mental Status:  oriented and alert    IV Access:  - None    Nursing Mobility/ADLs:  Walking   Independent  Transfer  Independent  Bathing  Independent  Dressing  Independent  Toileting  Independent  Feeding  Independent  Med Admin  Independent  Med Delivery   none    Wound Care Documentation and Therapy:  Incision 04/14/21 Neck Posterior (Active)   Dressing/Treatment Open to air 04/14/21 1325   Closure Open to air;Sutures 04/14/21 1325   Margins Approximated 04/14/21 1325   Incision Assessment Dry 04/14/21 1325   Drainage Amount None 04/14/21 1325   Odor None 04/14/21 1325   Peri-incision  Assessment Intact 04/14/21 1325   Number of days: 0        Elimination:  Continence:   Bowel: Yes  Bladder: Yes  Urinary Catheter: None   Colostomy/Ileostomy/Ileal Conduit: No       Date of Last BM: PTA    Intake/Output Summary (Last 24 hours) at 04/14/2021 1333  Last data filed at 04/14/2021 1236  Gross per 24 hour   Intake --   Output 1645 ml   Net -1645 ml     I/O last 3 completed shifts:  In: -   Out: 1105 [Urine:1100; Other:5]    Safety Concerns:     None    Impairments/Disabilities:      None    Nutrition Therapy:  Current Nutrition Therapy:   - Oral Diet:  General    Routes of Feeding: Oral  Liquids: No Restrictions  Daily Fluid Restriction: no  Last Modified Barium Swallow with Video (Video Swallowing Test): not done    Treatments at the Time of Hospital Discharge:   Respiratory Treatments: none  Oxygen Therapy:  is not on home oxygen therapy.  Ventilator:    - No ventilator support    Rehab Therapies: Physical Therapy and Occupational Therapy  Weight Bearing Status/Restrictions: No weight bearing restrictions  Other Medical Equipment (for information only, NOT a DME order):  walker  Other Treatments: NA    Patient's personal belongings (please select all  that are sent with patient):  Glasses    RN SIGNATURE:  Electronically signed by Glenna Durand on 04/14/21 at 1:36 PM EST

## 2021-04-14 NOTE — Care Coordination-Inpatient (Signed)
Received message from RN & provider. Patient anticipated to DC and needs a walker. No HH indicated at this time. Walker order obtained and sent to [facility] for delivery or pick up Monday (none available onsite to dispense).

## 2021-04-14 NOTE — Discharge Instructions (Addendum)
Good nutrition is important when healing from an illness, injury, or surgery.  Follow any nutrition recommendations given to you during your hospital stay.   If you were given an oral nutrition supplement while in the hospital, continue to take this supplement at home.  You can take it with meals, in-between meals, and/or before bedtime. These supplements can be purchased at most local grocery stores, pharmacies, and chain super-stores.   If you have any questions about your diet or nutrition, call the hospital and ask for the dietitian.

## 2021-04-14 NOTE — Progress Notes (Signed)
AVS medication list confusing. List was dicussed with pt, pt's wife and [facility] NP. Per [employer], plan is for the pt to continue taking percocet and flexeril as previously prescribed by Dr Alcide Goodness, but to follow up with MD about additional pain medication at outpatient visit. Pt and pt's wife both verbalized understanding and indicated that they liked this plan.

## 2021-04-14 NOTE — Plan of Care (Signed)
Problem: Discharge Planning  Goal: Discharge to home or other facility with appropriate resources  04/14/2021 1331 by Eli Hose Tauheedah Bok  Outcome: Adequate for Discharge  04/14/2021 1206 by Eli Hose Tationna Fullard  Outcome: Progressing     Problem: ABCDS Injury Assessment  Goal: Absence of physical injury  04/14/2021 1331 by Eli Hose Demitrious Mccannon  Outcome: Adequate for Discharge  04/14/2021 1206 by Eli Hose Robi Mitter  Outcome: Progressing     Problem: Pain  Goal: Verbalizes/displays adequate comfort level or baseline comfort level  Outcome: Adequate for Discharge

## 2021-04-14 NOTE — Plan of Care (Signed)
Problem: Discharge Planning  Goal: Discharge to home or other facility with appropriate resources  Outcome: Progressing     Problem: ABCDS Injury Assessment  Goal: Absence of physical injury  Outcome: Progressing

## 2021-04-14 NOTE — Progress Notes (Signed)
[facility]  [address] [place] [address]  Phone: [phone]  Fax: [phone]    Acute Care Occupational Therapy Evaluation    (Link to Caseload Tracking:    Acknowledge Orders   Time In/Out   OT Charge Capture   Rehab Caseload Tracker    Therapy Minute Tracking       Time In: 0950  Time Out: 1028  Minutes: 38     38     Jesse Savage is a 60 y.o. male   PRIMARY DIAGNOSIS:<principal problem not specified>  seroma       Reason for Referral:Low Back Pain (M54.5)  Inpatient: Payor: MEDICARE / Plan: MEDICARE PART A AND B / Product Type: *No Product type* /     SUBJECTIVE   Patient entering room with PCT after brief functional mobility around the unit upon arrival. Supportive spouse at bedside throughout session. Patient pleasantly verbose throughout session and expresses thanks for "wonderful care at [facility]. [place]".     Social/Functional Lives With: Spouse  Type of Home: House  Home Layout: One level  Home Access: Stairs to enter with rails  Entrance Stairs - Number of Steps: 3  Entrance Stairs - Rails: Both  Bathroom Shower/Tub: Pension scheme manager: Midwife: Grab bars in shower, Built-in shower seat, 3-in-1 commode, Hand-held shower    OBJECTIVE     VITAL SIGNS / OXYGEN / PAIN LINES / DRAINS / PRECAUTIONS   Vital Signs         Oxygen       Pre Treatment:   Pain Assessment: 0-10  Pain Level: 5  Pain Location: Back    Post Treatment: 5 Hemovac    Precautions/Restrictions  Required Braces or Orthoses?: Yes (soft c collar)                  COMMENTS   AROM LUE AROM (degrees)  LUE AROM : WFL  Left Hand AROM (degrees)  Left Hand AROM: WFL  RUE AROM (degrees)  RUE AROM : WFL  Right Hand AROM (degrees)  Right Hand AROM: WFL   PROM LUE PROM (degrees)  LUE PROM: WFL  Left Hand PROM (degrees)  Left Hand PROM: WFL  RUE PROM (degrees)  RUE PROM: WNL  Right Hand PROM (degrees)  Right Hand PROM: WFL    Strength LUE Strength  Gross LUE Strength: WFL  RUE  Strength  Gross RUE Strength: WFL; limited strength testing 2' spinal precautions   Hand/Fine Motor Hand Dominance  Hand Dominance: RightGood grip strength in BUEs   Balance Balance  Sitting Balance: Independent  Standing Balance: Modified independent    Sensation Impaired   Coordination Movements Are Fluid And Coordinated: Yes    Tone Tone RUE  RUE Tone: Normotonic  Tone LUE  LUE Tone: Normotonic   Edema     Activity Tolerance Activity Tolerance: Patient Tolerated treatment well     COGNITION / PERCEPTION COMMENTS   Orientation Overall Orientation Status: Within Functional Limits  Orientation Level: Oriented X4   Vision Vision: Within Functional Limits   Hearing Hearing: Within functional limits   Cognition  Overall Cognitive Status: WFL     Activities of Daily Living     Complete I Mod I Supervision/Setup CGA Min A Mod A Max A Total A Does Not Occur   Eating [x]  []  []  []  []  []  []  []  []    Grooming [x]  []  []  []  []  []  []  []  []   Bathing []  [x]  []  []  [x]  []  []  []  []    Upper Body Dressing [x]  []  []  []  []  []  []  []  []    Lower Body Dressing []  [x]  []  []  [x]  []  []  []  []    Toileting []  [x]  []  []  []  []  []  []  []    Toilet Transfer []  [x]  []  []  []  []  []  []  []    Shower Transfer []  []  []  []  []  []  []  []  []      Activities of Daily Living Comments   Educated patient on use of AE for LE ADLs; distributed long handled sponge and reacher;  patient declined need for sock aide as spouse assists with donning socks when needed  Bathing: min A s AE; mod I c AE  LE dressing: min A to doff / don footies s AE, mod I to don pants and doff footies c reacher  Toileting/toilet transfer: mod I c RW and BSC placed over low commode in room to simulate home environment         ASSESSMENT      POST ACUTE RECOMMENDATIONS   Recommendation to date pending progress:  Setting:  Home with spouse at d/c      Justification for Recommended Setting  Patient reports he believes he has a RW at home; also reports having a BSC    Equipment:    To Be Determined;  patient will need RW at d/c if he does not already have one     ASSESSMENT:  [name] presents s/p drainage of seroma; from [facility] following C5-6 bilateral laminectomy for decompression of spinal cord on 10/24; prior ACDF 3/21. Patient reports improved pain levels this date. Patient with hemovac drain and soft c collar this date. Requires min verbal cues to recall precautions. Educated patient on adhering to precautions at d/c with ADLs and IADLs. Demonstrates mod I for sit<>stand from chair and elevated commode with RW. Trial s RW for brief distance from toilet >chair; patient with impaired balance requiring HHA. Patient verbalizes compliance to use RW at d/c to increase safety and independence with functional mobility and ADLs. No further OT needs identified this date.     AM-PAC Daily Activity Inpatient      How much help for putting on and taking off regular lower body clothing? A Little   How much help for Bathing? None   How much help for Toileting? None   How much help for putting on and taking off regular upper body clothing? None   How much help for taking care of personal grooming? None   How much help for eating meals? None   AM-PAC Inpatient Daily Activity Raw Score 23   AM-PAC Inpatient ADL T-Scale Score  51.12   ADL Inpatient CMS 0-100% Score 15.86   ADL Inpatient CMS G-Code Modifier  CI           PLAN   FREQUENCY AND DURATION   for duration of hospital stay or until stated goals are met, whichever comes first.    D/C from OT Caseload      TREATMENT   EVALUATION LOW COMPLEXITY    TREATMENT Self Care (25 Minutes): Self care including lower body bathing and lower body dressing to decrease level of assistance required.      SAFETY  Patient cleared for brief functional mobility with RW and spouse present throughout room; RN agreeable and aware        Education Education Given To: Patient;Family  Education Provided: Plan of Care;Role of  Therapy;Precautions;ADL Adaptive Strategies;IADL  Safety;Equipment  Education Method: Teach Back;Demonstration;Verbal  Barriers to Learning: None  Education Outcome: Verbalized understanding;Demonstrated understanding

## 2021-04-14 NOTE — Op Note (Signed)
[facility]  [facility]    Name:  CHING, RABIDEAU  DOB:  25-Oct-1960  ACCOUNT #:  [id number]  DATE OF SERVICE:  04/12/2021    PREOPERATIVE DIAGNOSIS:  Seroma.    POSTOPERATIVE DIAGNOSIS:  Seroma.    PROCEDURE PERFORMED:  Incision and drainage of deep tissue seroma, microdissection with the use of the operating microscope.    SURGEON:  Ward Neil Crouch, MD    ASSISTANTEarlene Plater, NP    ANESTHESIA:  General, Dr. Roma Kayser    COMPLICATIONS:  None.    SPECIMENS REMOVED:  None.    IMPLANTS:  none    ESTIMATED BLOOD LOSS:  15 mL.    FINDINGS:  Large volume seroma occupying subcutaneous tissues, muscles, fascia, down to the bone.    INDICATIONS:  Seromatous collection in surgical site.    PROCEDURE:  After induction of general endotracheal anesthesia, the patient was positioned in skeletal fixation and turned prone on the operating table.  The region of the previous posterior cervical laminectomy was prepped.  The sutures were removed, and he was reprepped, and the site was draped into a sterile field.  Using scissors, the deeper subcutaneous sutures were cut and the tissue planes opened.  Suture was removed from the incision.  The opening was carried down through the fascia.  Extensive seroma was encountered, and this was all drained away.  Exploration was made of the laminectomy site.  This actually looked very good.  There was no good clear indication if there was any CSF leak of any kind but in a maximum of caution, DuraGen was placed along the dural margins and sealed in with dural glue.  Copious irrigation was carried out in the site.  A 7-mm flat Blake drain was left in the surgical site and brought out through a separate stab incision.  The muscle was closed using 0 Vicryl.  The fascia was closed using 0 Vicryl.  The subcutaneous tissues were closed using 2-0 Vicryl, and the skin was closed using 2-0 nylon running locking stitch.  A sterile dressing was applied.  The patient was turned out of  pins and went to the recovery room in satisfactory condition.      Cathrine Muster, MD      [id number]  D:  04/15/2021 13:23  T:  04/15/2021 18:18  JOB #:  [id number]

## 2021-04-14 NOTE — Care Coordination-Inpatient (Signed)
AddendumGilford Savage obtained to provide to patient prior to DC. Met with patient and daughter at bedside Delivered DME and ticket in folder for [facility]. Patient has no new meds and has instruction for follow up.

## 2021-04-14 NOTE — Progress Notes (Signed)
Post op rounding:    Subjective:  Sitting up eating in chair.  Looks much better today.  Pain controlled with current regiment  Denies chest pain, sob, cough, abdominal pain.     Objective   Blood pressure (!) 148/90, pulse 82, temperature 98.7 ??F (37.1 ??C), temperature source Oral, resp. rate 18, SpO2 97 %.     FC X 4    Incision - C/D/I, hemovac in place, minimal output         D B T WE FE Grip HI     R 5/5 5/5 5/5 5/5 5/5 5/5 5/5     L 5/5 5/5 5/5 5/5 5/5 5/5 5/5     Assessment/Plan   Cervical stenosis with myelopathy  S/p posterior cervical decompression at C5-6 for ongoing myelopathy on 04/01/21  S/p drainage of the fluid collection by IR 11/2 at which time approximately 100 ml of serous fluid was aspirated and a culture was sent to the lab  POD #2 Incision and drainage of post operative seroma of the posterior cervical spine   Analgesics  PT/OT  scds for dvt ppx  Patient does not want to return to [facility] rehab. He wants to do [facility].   He has been able to stand and walk greater than 336ft.   I spoke with PT and they are in agreement.   Will d/c drain and most likely d/c home later today or tomorrow morning.       Acute myelopathy   Back pain   History of DVT of lower extremity   OSA (obstructive sleep apnea)   Sciatica

## 2021-04-16 NOTE — Telephone Encounter (Signed)
Patient is scheduled for 05/01/2021 at 10:30am

## 2021-04-16 NOTE — Telephone Encounter (Signed)
I called and left a voice message informing patient that he doesn't need to come to the appointment on 04/17/2021. I asked that he return the call to confirm that he received the message.

## 2021-05-01 ENCOUNTER — Ambulatory Visit: Admit: 2021-05-01 | Discharge: 2021-05-01 | Payer: MEDICARE | Attending: Nurse Practitioner | Primary: Internal Medicine

## 2021-05-01 DIAGNOSIS — G959 Disease of spinal cord, unspecified: Secondary | ICD-10-CM

## 2021-05-01 NOTE — Progress Notes (Signed)
Subjective:        Chief Complaints:   C5-C6 bilateral laminectomy for decompression of spinal cord, microdissection with use of the operating microscope. 03/31/2021  Incision and drainage of deep tissue seroma, microdissection with the use of the operating microscope. 04/14/2021  Patient presents today for their first post-operative follow up and suture removal   Patient reports incisional pain.    Patient reports right shoulder and some incisional pain.  Right posterior leg pain.   Patient rates pain a 4/10 on the pain scale today; this is after taking 2 pain pills.                HPI:  Jesse Savage is a 60 year old Caucasian male who presents today for his first postoperative visit after undergoing a posterior cervical decompression at C5-6 on 04/01/2021 for cervical myelopathy and subsequent incision and drainage of postoperative seroma on 04/12/2021.  Today, the patient presents with a significant improvement in his posterior neck and bilateral shoulder pain.  He reports having some complaints of right-sided neck and shoulder pain, however this is mostly after being active.  He does report that his right-sided sciatica pain has recently returned and reports having numbness and tingling in the fingertips of his left thumb and forefinger.  He denies any complaints of fever, chills, redness or drainage from his incision.          Review of Systems   All systems reviewed and significant for occasional right-sided posterior neck and trapezius pain, right lower extremity radicular pain.  All other systems reviewed and negative for complaint.  Current Medications:    Current Outpatient Medications:     cyclobenzaprine (FLEXERIL) 10 MG tablet, Take 10 mg by mouth 3 times daily as needed for Muscle spasms, Disp: , Rfl:     oxyCODONE-acetaminophen (PERCOCET) 7.5-325 MG per tablet, , Disp: , Rfl:     sennosides-docusate sodium (SENOKOT-S) 8.6-50 MG tablet, Take 1 tablet by mouth 2 times daily, Disp: , Rfl:     lactulose  (CHRONULAC) 10 GM/15ML solution, Take by mouth daily as needed, Disp: , Rfl:     Medical History:  Past Medical History:   Diagnosis Date    Back pain     Lumbar disc disease     OSA (obstructive sleep apnea)     Sciatica        Allergies/Intolerance:  Allergies   Allergen Reactions    Codeine      Other reaction(s): itch  Other reaction(s): NA    Dilaudid [Hydromorphone Hcl]      Other reaction(s): malaise    Duloxetine Hcl      Other reaction(s): hypertension  Other reaction(s): NA    Gabapentin      Other reaction(s): sleepy  Other reaction(s): dizzy  Other reaction(s): NA    Hydrocodone      Other reaction(s): itch    Pregabalin      Other reaction(s): neurotic episodes    Tramadol Hcl      Other reaction(s): malaise        Surgical History:  Past Surgical History:   Procedure Laterality Date    BACK SURGERY  06/2010    L5-S1 REMOVAL OF SCREWS    CERVICAL SPINE SURGERY  08/26/2019    C5-C6 ACDF    FACIAL SURGERY  1981    HERNIA REPAIR  2002    LUMBAR FUSION  04/22/2011    L4-L5 PLIF USING DePUY SPACER, BMP, AUTOGRAFT, LATERAL FUSION USING SYNTHES SCREWS  THUMB ARTHROSCOPY  1995    TONSILLECTOMY AND ADENOIDECTOMY  1972        Family History:  Family History   Problem Relation Age of Onset    Heart Failure Father     Diabetes Father     Diabetes Paternal Grandmother     Heart Failure Paternal Grandfather        Social History:  Social History       Tobacco History       Smoking Status  Never      Smokeless Tobacco Use  Never              Alcohol History       Alcohol Use Status  Never              Drug Use       Drug Use Status  Never              Sexual Activity       Sexually Active  Not Asked                       Objective:  Ht 5\' 8"  (1.727 m)    Wt 167 lb (75.8 kg)    BMI 25.39 kg/m??   Body mass index is 25.39 kg/m??.        General Examination:  SURGICAL SITE The incision is well-healed with no erythema or discharge.     Motor Strength:  Deltoid bilateral 5/5.   Biceps bilateral 5/5.   Triceps bilateral  5/5.   Wrist extensor bilateral 5/5.   Finger extension bilateral 5/5.   Grip bilateral 5/5.   Interossii bilateral 5/5.   Opponens pollicis bilateral 5/5.          Assessment/Plan:  Problem List Items Addressed This Visit    None  Visit Diagnoses       Cervical myelopathy (HCC)    -  Primary    Relevant Orders    Amb External Referral To Physical Therapy               The patient presents for follow up s/p posterior cervical decompression at C5-6 and subsequent incision and drainage of postoperative seroma.  Overall, he is doing significantly better since surgery.  His pain has improved however some of his prior chronic pain symptoms have recently returned.  The incision appears to be well-healed and the sutures were removed at today's visit.  I have sent a referral to outpatient physical therapy at [facility].  I will plan to see him back in the office in approximately 6 weeks in follow-up.     New Prescriptions    No medications on file      Orders Placed This Encounter   Procedures    Amb External Referral To Physical Therapy     Referral Priority:   Routine     Referral Type:   Consult for Advice and Opinion     Referral Reason:   Specialty Services Required     Referral Location:   PT/OT, [facility]     Requested Specialty:   Physical Therapist     Number of Visits Requested:   1                Follow Up:  Return in about 6 weeks (around 06/12/2021).                 Electronically signed by 08/10/2021  Jenness Corner, APRN - NP on 05/01/2021 at 12:41 PM

## 2021-06-12 NOTE — Progress Notes (Signed)
Subjective:  C5-C6 bilateral laminectomy for decompression of spinal cord, microdissection with use of the operating microscope. 03/31/2021      Chief Complaints:   Patient was last seen on 05/01/2021 at which time Physical Therapy was ordered.   Patient states that Physical Therapy never called him.    Patient presents today with complaints of bilateral low back, radiating to right underneath right buttock. Neck pain is much better.  Patient rates pain 4/10 on the pain scale today.                       HPI:  Jesse Savage is a very pleasant 61 year old Caucasian male who returns today in follow-up after last being seen on 05/01/2021.  He is status post posterior cervical decompression at C5-6 on 04/01/2021 for cervical myelopathy and subsequent incision and drainage of postoperative seroma on 04/12/2021.  He was referred for outpatient physical therapy at his last appointment, however he reports that he was never contacted.  Today, he has very little complaints of neck pain.  He reports that occasionally he has some stiffness in his posterior neck muscles but overall is feeling significantly better since his surgery.  His main complaint today is his right-sided buttock pain radiating down the posterior lateral aspect of the right leg to his ankle.  He also reports having some associated stiffness in his low back.  His pain is worse with ambulation and prolonged standing and he also reports having associated muscle spasms in the bilateral posterior thighs.        Review of Systems   Musculoskeletal:  Positive for back pain and myalgias. Negative for neck pain and neck stiffness.   Neurological:  Negative for weakness and numbness.      Current Medications:    Current Outpatient Medications:     oxyCODONE-acetaminophen (PERCOCET) 7.5-325 MG per tablet, , Disp: , Rfl:     cyclobenzaprine (FLEXERIL) 10 MG tablet, Take 10 mg by mouth 3 times daily as needed for Muscle spasms, Disp: , Rfl:     sennosides-docusate sodium  (SENOKOT-S) 8.6-50 MG tablet, Take 1 tablet by mouth 2 times daily, Disp: , Rfl:     lactulose (CHRONULAC) 10 GM/15ML solution, Take by mouth daily as needed, Disp: , Rfl:     Medical History:  Past Medical History:   Diagnosis Date    Back pain     Lumbar disc disease     OSA (obstructive sleep apnea)     Sciatica        Allergies/Intolerance:  Allergies   Allergen Reactions    Codeine      Other reaction(s): itch  Other reaction(s): NA    Dilaudid [Hydromorphone Hcl]      Other reaction(s): malaise    Duloxetine Hcl      Other reaction(s): hypertension  Other reaction(s): NA    Gabapentin      Other reaction(s): sleepy  Other reaction(s): dizzy  Other reaction(s): NA    Hydrocodone      Other reaction(s): itch    Pregabalin      Other reaction(s): neurotic episodes    Tramadol Hcl      Other reaction(s): malaise        Surgical History:  Past Surgical History:   Procedure Laterality Date    BACK SURGERY  06/2010    L5-S1 REMOVAL OF SCREWS    CERVICAL SPINE SURGERY  08/26/2019    C5-C6 ACDF    FACIAL SURGERY  1981    HERNIA REPAIR  2002    LUMBAR FUSION  04/22/2011    L4-L5 PLIF USING DePUY SPACER, BMP, AUTOGRAFT, LATERAL FUSION USING SYNTHES SCREWS    THUMB ARTHROSCOPY  1995    TONSILLECTOMY AND ADENOIDECTOMY  1972          Family History:  Family History   Problem Relation Age of Onset    Heart Failure Father     Diabetes Father     Diabetes Paternal Grandmother     Heart Failure Paternal Grandfather        Social History:  Social History       Tobacco History       Smoking Status  Never      Smokeless Tobacco Use  Never              Alcohol History       Alcohol Use Status  Never              Drug Use       Drug Use Status  Never              Sexual Activity       Sexually Active  Not Asked                       Objective:  Ht 5\' 8"  (1.727 m)    Wt 166 lb 3.2 oz (75.4 kg)    BMI 25.27 kg/m??   Body mass index is 25.27 kg/m??.        General Examination:  SURGICAL SITE The posterior cervical incision is well-healed  with no erythema or discharge.     Motor Strength:  Deltoid bilateral 5/5.   Biceps right 4+/5, left 5/5.   Triceps bilateral 5/5.  External rotators bilateral 4/5   Wrist extensor bilateral 5/5.   Finger extension bilateral 5/5.   Grip bilateral 5/5.   Interossii bilateral 5/5.   Opponens pollicis bilateral 5/5.      Examination:   Motor Strength:  Iliopsoas right 4+/5, 5/5.   Hip flexor bilateral 5/5.   Quadriceps bilateral 5/5.   Hamstrings bilateral 5/5.   Tibialis anterior bilateral 5/5.   Plantar Flexion right 4/5, left 5/5.   Extensor Hallicus Longus bilateral 5/5.   Gastrocnemius bilateral 5/5.     Lumbar Spine/Lower back:  LOWER BACK: No palpable abnormality, no paraspinal tenderness, no paravertebral spasm, no percussion tenderness. Normal lumbar flexion.                    Assessment/Plan:  Problem List Items Addressed This Visit    None  Visit Diagnoses       Cervical myelopathy (HCC)    -  Primary    Relevant Orders    Amb External Referral To Physical Therapy    Lumbar radiculopathy        Relevant Orders    Amb External Referral To Physical Therapy               The patient presents for follow up s/p posterior cervical decompression at C5-6 on 04/01/2021.  He was referred for outpatient physical therapy at his last visit, however unfortunately he was not contacted.  Today, he has very minimal neck pain and feels that his overall strength is improving.  He does occasionally have some posterior neck stiffness.  He also continues to have chronic back pain as well as right buttock and right posterior leg  pain.  He will require surgical intervention for his lumbar spine once he is fully recovered from his prior cervical surgeries.  I do think he would benefit from outpatient physical therapy and have sent a new referral to [facility] as this is much closer to his home.  I have requested both cervical and lumbar stabilization as he could benefit from additional conservative management for his  lumbar spine and radicular symptoms until he is able to have additional surgical intervention.  I will plan to see him back in the office in approximately 8 weeks in follow-up.     New Prescriptions    No medications on file      Orders Placed This Encounter   Procedures    Amb External Referral To Physical Therapy     Referral Priority:   Routine     Referral Type:   Consult for Advice and Opinion     Referral Reason:   Specialty Services Required     Referral Location:   [facility], Physical Therapy     Requested Specialty:   Physical Therapist     Number of Visits Requested:   1                Follow Up:  Return in about 8 weeks (around 08/07/2021).                 Electronically signed by Amado Nash, APRN - NP on 06/12/2021 at 2:18 PM

## 2021-08-07 NOTE — Progress Notes (Unsigned)
[address]  [place], [place] [address]  [phone]    Subjective:     Chief Complaints:   Patient was last seen on 06/12/2021 at which time Physical Therapy was ordered.  Patient returns today in follow up; he reports  [facility]: 06/20/2021  Pain level:        HPI:  HPI    Current Medications:    Current Outpatient Medications:     oxyCODONE-acetaminophen (PERCOCET) 7.5-325 MG per tablet, , Disp: , Rfl:     cyclobenzaprine (FLEXERIL) 10 MG tablet, Take 10 mg by mouth 3 times daily as needed for Muscle spasms, Disp: , Rfl:     sennosides-docusate sodium (SENOKOT-S) 8.6-50 MG tablet, Take 1 tablet by mouth 2 times daily, Disp: , Rfl:     lactulose (CHRONULAC) 10 GM/15ML solution, Take by mouth daily as needed, Disp: , Rfl:     Medical History:  Past Medical History:   Diagnosis Date    Back pain     Lumbar disc disease     OSA (obstructive sleep apnea)     Sciatica        Allergies/Intolerance:  Allergies   Allergen Reactions    Codeine      Other reaction(s): itch  Other reaction(s): NA    Dilaudid [Hydromorphone Hcl]      Other reaction(s): malaise    Duloxetine Hcl      Other reaction(s): hypertension  Other reaction(s): NA    Gabapentin      Other reaction(s): sleepy  Other reaction(s): dizzy  Other reaction(s): NA    Hydrocodone      Other reaction(s): itch    Pregabalin      Other reaction(s): neurotic episodes    Tramadol Hcl      Other reaction(s): malaise        Surgical History:  Past Surgical History:   Procedure Laterality Date    BACK SURGERY  06/2010    L5-S1 REMOVAL OF SCREWS    CERVICAL SPINE SURGERY  08/26/2019    C5-C6 ACDF    FACIAL SURGERY  1981    HERNIA REPAIR  2002    LUMBAR FUSION  04/22/2011    L4-L5 PLIF USING DePUY SPACER, BMP, AUTOGRAFT, LATERAL FUSION USING SYNTHES SCREWS    THUMB ARTHROSCOPY  1995    TONSILLECTOMY AND ADENOIDECTOMY  1972        Family History:  Family History   Problem Relation Age of Onset    Heart Failure Father     Diabetes Father      Diabetes Paternal Grandmother     Heart Failure Paternal Grandfather        Social History:  Social History       Tobacco History       Smoking Status  Never      Smokeless Tobacco Use  Never              Alcohol History       Alcohol Use Status  Never              Drug Use       Drug Use Status  Never              Sexual Activity       Sexually Active  Not Asked                     Objective:  There were no vitals taken for this visit.  There is no height or weight on file to calculate BMI.      General Examination:  GENERAL APPEARANCE: pleasant, well nourished, well developed; in no acute distress  GCS 15.  HEENT:  atraumatic, normocephalic  Cardiac:  regular rate and rhythm; normal S1, S2  Respiratory:  clear to auscultation bilaterally     Lumbar Spine/Lower back:  INSPECTION: No abnormal curvature of spine. No muscular atrophy.   PALPATION: No palpable abnormality. No paraspinal tenderness. No paravertebral spasm. No vertebral spine tenderness.   SENSORY EXAM: Sensory is grossly normal to light touch throughout.   GAIT: Within normal limits.   LOWER EXTREMITY TONE Normal tone bilaterally.     Motor Strength:  Iliopsoas bilateral 5/5.   Quadriceps bilateral 5/5.   Hamstrings bilateral 5/5  Tibialis anterior bilateral 5/5.   Extensor Hallicus Longus bilateral 5/5.   Gastrocnemius bilateral 5/5.     RADIOGRAPHS   ***         Assessment/Plan:  Problem List Items Addressed This Visit    None           Follow Up:  No follow-ups on file.          Electronically signed by Meta Hatchet, MA on 08/06/2021 at 3:41 PM

## 2021-08-12 ENCOUNTER — Other Ambulatory Visit: Payer: Self-pay | Admitting: Cardiovascular Disease

## 2021-08-12 NOTE — Telephone Encounter (Signed)
Scheduled

## 2021-08-12 NOTE — Telephone Encounter (Signed)
Please schedule 12 month F/U appointment. Thank you! 

## 2021-09-23 ENCOUNTER — Other Ambulatory Visit: Payer: Self-pay | Admitting: Cardiovascular Disease

## 2021-09-25 ENCOUNTER — Ambulatory Visit: Payer: Self-pay | Admitting: Cardiovascular Disease

## 2021-10-28 NOTE — Progress Notes (Unsigned)
Cardiology Office Note  Date:  10/29/2021   ID:  Erik Perkins, DOB [birth year], MRN [id number]  [facility]P:  [facility]   Chief Complaint  Patient presents with   Follow-up    12 month follow up. Medications verbally reviewed with patient. Dizziness and Blood Pressure fluctuations.    HPI:  Mr. Bloodgood is a very pleasant 61 year old gentleman with a long history of  Former smoker 11/2009 hyperlipidemia,  hypertension  [facility] on January 21 2010 with stuttering worsening chest discomfort,  troponin elevation 0.33,  cardiac catheterization showing severe 99% distal RCA disease.  DES stent was placed . 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  Reports recent episode where his blood pressure dropped from 120 systolic in a sitting position down to 90 systolic with standing He cut back on his metoprolol to tartrate down to 12.5 daily, reports feeling better, no orthostasis symptoms  No regular exercise program, walks more than 10,000 steps a day at work Denies chest pain on exertion concerning for angina Denies significant lower extremity edema Not taking Lasix/potassium  Has not had recent follow-up with the [place] for lab work Lab work reviewed from the [facility]  hemoglobin A1c 5.6 Total cholesterol 140 Direct LDL 81  EKG personally reviewed by myself on today's visit Shows normal sinus rhythm 53 bpm.  No significant ST-T wave changes  Other past medical history reviewed Cardiac catheterization showed 99% distal RCA disease, moderate to severe proximal RCA disease estimated at 60-70%, mild diffuse LAD and left circumflex disease, moderate ostial D2 and D3 disease, moderate distal inferior wall hypokinesis on LV gram. A Xience 3.5 x 12 mm DES stent was placed to his distal RCA.}  PMH:   has a past medical  history of Coronary artery disease, GERD (gastroesophageal reflux disease), Hyperlipidemia, Hypertension, and MI (myocardial infarction) (HCC).  PSH:    Past Surgical History:  Procedure Laterality Date   CARDIAC CATHETERIZATION     FRACTURE SURGERY Right    wrist  61yo   SHOULDER ARTHROSCOPY WITH OPEN ROTATOR CUFF REPAIR AND DISTAL CLAVICLE ACROMINECTOMY Left 10/23/2020   Procedure: LEFT SHOULDER ARTHROSCOPY, DEBRIDEMENT, MINI OPEN ROTATOR CUFF TEAR REPAIR, BICEPS TENODESIS;  Surgeon: Cammy Copa, MD;  Location: [facility] OR;  Service: Orthopedics;  Laterality: Left;   VASECTOMY  1984    Current Outpatient Medications  Medication Sig Dispense Refill   aspirin 81 MG tablet Take 81 mg by mouth daily.     lisinopril (ZESTRIL) 5 MG tablet TAKE 1 TABLET DAILY 90 tablet 0   metoprolol tartrate (LOPRESSOR) 25 MG tablet Take 12.5 mg by mouth daily.     omeprazole (PRILOSEC) 20 MG capsule Take 40 mg by mouth daily.     rosuvastatin (CRESTOR) 40 MG tablet TAKE 1 TABLET DAILY 90 tablet 0   tamsulosin (FLOMAX) 0.4 MG CAPS capsule Take 1 capsule (0.4 mg total) by mouth 2 (two) times daily.     benzonatate (TESSALON) 100 MG capsule Take 100 mg by mouth 2 (two) times daily as needed for cough. (Patient not taking: Reported on 10/29/2021)     furosemide (LASIX) 20 MG tablet Take 1 tablet (20 mg total) by mouth daily as needed. (Patient not taking: Reported on 10/12/2020) 90 tablet 3   meloxicam (MOBIC) 15 MG tablet Take 1 tablet (15 mg total) by mouth daily as needed for  pain. (Patient not taking: Reported on 10/29/2021) 30 tablet 0   methocarbamol (ROBAXIN) 500 MG tablet Take 1 tablet (500 mg total) by mouth every 8 (eight) hours as needed. (Patient not taking: Reported on 10/29/2021) 30 tablet 0   nitroGLYCERIN (NITROSTAT) 0.4 MG SL tablet Place 1 tablet (0.4 mg total) under the tongue every 5 (five) minutes as needed for chest pain. (Patient not taking: Reported on 10/29/2021) 25 tablet 0   potassium  chloride SA (K-DUR,KLOR-CON) 20 MEQ tablet Take 1 tablet (20 mEq total) by mouth daily as needed. (Patient not taking: Reported on 10/12/2020) 90 tablet 3   No current facility-administered medications for this visit.     Allergies:   Aspartame, Cephradine, and Other   Social History:  The patient  reports that he quit smoking about 11 years ago. He has a 25.00 pack-year smoking history. He has never used smokeless tobacco. He reports current alcohol use of about 4.0 standard drinks per week. He reports that he does not use drugs.   Family History:   family history includes Cancer in his father; Coronary artery disease in his sister; Diabetes in his sister; Hyperlipidemia in his father and sister.    Review of Systems: Review of Systems  Constitutional: Negative.   HENT: Negative.    Respiratory: Negative.    Cardiovascular: Negative.   Gastrointestinal: Negative.   Musculoskeletal: Negative.   Neurological: Negative.   Psychiatric/Behavioral: Negative.    All other systems reviewed and are negative.  PHYSICAL EXAM: VS:  BP 120/76 (BP Location: Left Arm, Patient Position: Sitting, Cuff Size: Normal)   Pulse (!) 53   Ht 5\' 10"  (1.778 m)   Wt 222 lb 9.6 oz (101 kg)   SpO2 98%   BMI 31.94 kg/m  , BMI Body mass index is 31.94 kg/m. Constitutional:  oriented to person, place, and time. No distress.  HENT:  Head: Grossly normal Eyes:  no discharge. No scleral icterus.  Neck: No JVD, no carotid bruits  Cardiovascular: Regular rate and rhythm, no murmurs appreciated Pulmonary/Chest: Clear to auscultation bilaterally, no wheezes or rails Abdominal: Soft.  no distension.  no tenderness.  Musculoskeletal: Normal range of motion Neurological:  normal muscle tone. Coordination normal. No atrophy Skin: Skin warm and dry Psychiatric: normal affect, pleasant  Recent Labs: No results found for requested labs within last 8760 hours.    Lipid Panel No results found for: CHOL, HDL,  LDLCALC, TRIG    Wt Readings from Last 3 Encounters:  10/29/21 222 lb 9.6 oz (101 kg)  10/18/20 218 lb 3 oz (99 kg)  09/21/20 223 lb (101.2 kg)     ASSESSMENT AND PLAN:   Atherosclerosis of native coronary artery of native heart with angina pectoris (HCC) -  Currently with no symptoms of angina. No further workup at this time. Continue current medication regimen.  Essential hypertension Recent low blood pressure/orthostasis We will continue low-dose metoprolol tartrate 12.5 daily, lisinopril 5, On Flomax twice daily for BPH No changes made  Lower extremity edema likely venous insufficiency Minimal symptoms on today's visit, for recurrence could wear compression hose  Smoking hx Stop smoking years ago, 2011  Hyperlipidemia Plans on [facility] if not this can be done through our office  Neuropathy Etiology unclear, nondiabetic On hard concrete for many years, planning on retiring Reports symptoms are stable   Total encounter time more than 30 minutes  Greater than 50% was spent in counseling and coordination of care  with the patient    Orders Placed This Encounter  Procedures   EKG 12-Lead      Signed, Dossie Arbour, M.D., Ph.D. 10/29/2021  [facility] [place], [place] [phone]

## 2021-10-29 ENCOUNTER — Encounter: Payer: Self-pay | Admitting: Cardiovascular Disease

## 2021-10-29 ENCOUNTER — Ambulatory Visit: Admitting: Cardiovascular Disease

## 2021-10-29 VITALS — BP 120/76 | HR 53 | Ht 70.0 in | Wt 222.6 lb

## 2021-10-29 DIAGNOSIS — I10 Essential (primary) hypertension: Secondary | ICD-10-CM

## 2021-10-29 DIAGNOSIS — I25118 Atherosclerotic heart disease of native coronary artery with other forms of angina pectoris: Secondary | ICD-10-CM | POA: Diagnosis not present

## 2021-10-29 DIAGNOSIS — E782 Mixed hyperlipidemia: Secondary | ICD-10-CM

## 2021-10-29 MED ORDER — NITROGLYCERIN 0.4 MG SL SUBL: 0.4000 mg | SUBLINGUAL_TABLET | SUBLINGUAL | 1 refills | Status: AC | PRN

## 2021-10-29 MED ORDER — ROSUVASTATIN CALCIUM 40 MG PO TABS: 40.0000 mg | ORAL_TABLET | Freq: Every day | ORAL | 3 refills | Status: DC

## 2021-10-29 MED ORDER — LISINOPRIL 5 MG PO TABS: 5.0000 mg | ORAL_TABLET | Freq: Every day | ORAL | 3 refills | Status: DC

## 2021-10-29 MED ORDER — METOPROLOL TARTRATE 25 MG PO TABS: 12.5000 mg | ORAL_TABLET | Freq: Every day | ORAL | 3 refills | Status: DC

## 2021-10-29 NOTE — Patient Instructions (Signed)
Medication Instructions:  No changes  If you need a refill on your cardiac medications before your next appointment, please call your pharmacy.   Lab work: No new labs needed  Testing/Procedures: No new testing needed  Follow-Up: At [facility], you and your health needs are our priority.  As part of our continuing mission to provide you with exceptional heart care, we have created designated Provider Care Teams.  These Care Teams include your primary Cardiologist (physician) and Advanced Practice Providers (APPs -  Physician Assistants and Nurse Practitioners) who all work together to provide you with the care you need, when you need it.  You will need a follow up appointment in 12 months  Providers on your designated Care Team:   Christopher Berge, NP Ryan Dunn, PA-C Cadence Furth, PA-C  COVID-19 Vaccine Information can be found at: [email or web address] For questions related to vaccine distribution or appointments, please email [email or web address] or call [phone].   

## 2022-01-22 NOTE — Telephone Encounter (Signed)
Left VM. I called regarding appointment for 01/23/2022 to ask what pt is to be seen for.

## 2022-01-22 NOTE — Telephone Encounter (Signed)
-----   Message from Roselind Messier, MD sent at 01/22/2022  2:51 PM EDT -----  New referral. No tracks. What gives? Is it not a requirement at for the visit reason??

## 2022-01-22 NOTE — Telephone Encounter (Signed)
Spoke with pt, he stated he just wants to get established with a cardiologist. His wife wanted him to be seen. He does not have any family history of any cardiac issues, he did not recall having any tests with another physician that warranted a referral.

## 2022-01-23 ENCOUNTER — Ambulatory Visit
Admit: 2022-01-23 | Discharge: 2022-01-23 | Payer: BLUE CROSS/BLUE SHIELD | Attending: Cardiovascular Disease | Primary: Family Medicine

## 2022-01-23 DIAGNOSIS — I10 Essential (primary) hypertension: Secondary | ICD-10-CM

## 2022-01-23 NOTE — Progress Notes (Addendum)
[facility]  [address], [address]  [place], [place] [address]  PHONE: [phone]        01/23/22        NAME:  Hayden Franco  DOB: Sep 16, 1960  MRN: [id number]     CHIEF COMPLAINT:    Hypertension      SUBJECTIVE:     61 yo male self referred for of fear CV disease. Asymptomatic.    Phx:  Negative    Fhx:  Father alive 47  Mother alive and well 48  2 sisters alive and well.    Shx:  Married. Non smoker. Owns a [employer].    Ros:  O/w NC       Medications were all reviewed with the patient today and updated as necessary.   No current outpatient medications on file.     No current facility-administered medications for this visit.        No Known Allergies        PHYSICAL EXAM:     Wt Readings from Last 3 Encounters:   01/23/22 261 lb 11.2 oz (118.7 kg)     BP Readings from Last 3 Encounters:   01/23/22 (!) 130/100       BP (!) 130/100   Pulse 65   Ht 6\' 4"  (1.93 m)   Wt 261 lb 11.2 oz (118.7 kg)   BMI 31.86 kg/m     Physical Exam  Vitals reviewed.   HENT:      Head: Normocephalic and atraumatic.   Eyes:      Extraocular Movements: Extraocular movements intact.      Pupils: Pupils are equal, round, and reactive to light.   Cardiovascular:      Rate and Rhythm: Normal rate.      Heart sounds: Normal heart sounds.   Pulmonary:      Effort: Pulmonary effort is normal.      Breath sounds: Normal breath sounds.   Abdominal:      General: Abdomen is flat.      Palpations: Abdomen is soft. There is no mass.   Musculoskeletal:         General: Normal range of motion.      Cervical back: Normal range of motion.   Skin:     General: Skin is warm and dry.   Neurological:      General: No focal deficit present.      Mental Status: He is alert and oriented to person, place, and time.   Psychiatric:         Mood and Affect: Mood normal.         RECENT LABS AND RECORDS REVIEW    Sinus  Rhythm   -RSR(V1) -nondiagnostic.     PROBABLY NORMAL      ASSESSMENT and PLAN    Hayden Franco was seen today for  hypertension.    Diagnoses and all orders for this visit:    Primary hypertension    Diet, exercise, weight loss, fu PCP  Check CACS  Check lipids    Return in 4 weeks (on 02/20/2022).       02/22/2022, MD  01/23/2022  12:13 PM

## 2022-01-23 NOTE — Addendum Note (Signed)
Addended byEarvin Hansen on: 01/23/2022 12:15 PM     Modules accepted: Orders

## 2022-10-24 DIAGNOSIS — C787 Secondary malignant neoplasm of liver and intrahepatic bile duct: Principal | ICD-10-CM

## 2022-10-24 NOTE — ED Provider Notes (Signed)
[facility] EMERGENCY DEPT  EMERGENCY DEPARTMENT ENCOUNTER      Pt Name: Jesse Savage  MRN: [id number]  Birthdate [birth year]  Date of evaluation: 10/24/2022  Provider evaluation time: 10/25/22 0017  Provider: Mora Bellman, PA-C    CHIEF COMPLAINT       Chief Complaint   Patient presents with    Fever     Went to Dr and urine culture said staph  epidermidis.  Given antibiotics, but running a fever still. He has been on 2 antibiotics and now has been changed to sulfam/TMP. He is now feeling worse again.          HISTORY OF PRESENT ILLNESS    Patient presents for evaluation for history of intermittent fevers.  Patient states he followed up with his primary care doctor who performed a urine culture that was positive for "staph epidermidis".  Patient states he has been on 2 different antibiotics and now on the third.  Patient also complains of midepigastric abdominal pain that has been present for approximately 2 months.  Patient denies any nausea or vomiting.  Patient does have a history of chronic back pain.  Patient otherwise is stable at this time    The history is provided by the patient.       Nursing Notes were reviewed.    REVIEW OF SYSTEMS       Review of Systems   Constitutional:  Positive for fatigue and fever. Negative for chills.   HENT:  Negative for ear pain and sore throat.    Eyes:  Negative for pain and visual disturbance.   Respiratory:  Negative for apnea, chest tightness and shortness of breath.    Cardiovascular:  Negative for chest pain and palpitations.   Gastrointestinal:  Positive for abdominal pain. Negative for nausea and vomiting.   Genitourinary:  Negative for dysuria and urgency.   Musculoskeletal:  Negative for arthralgias and myalgias.   Skin:  Negative for color change, rash and wound.   Neurological:  Negative for speech difficulty, weakness, numbness and headaches.   Psychiatric/Behavioral:  Negative for behavioral problems and confusion.    All other systems reviewed and are  negative.      Except as noted above the remainder of the review of systems was reviewed and negative.       PAST MEDICAL HISTORY     Past Medical History:   Diagnosis Date    Back pain     Lumbar disc disease     OSA (obstructive sleep apnea)     Sciatica        SURGICAL HISTORY       Past Surgical History:   Procedure Laterality Date    BACK SURGERY  06/2010    L5-S1 REMOVAL OF SCREWS    CERVICAL SPINE SURGERY  08/26/2019    C5-C6 ACDF    FACIAL SURGERY  1981    HERNIA REPAIR  2002    LUMBAR FUSION  04/22/2011    L4-L5 PLIF USING DePUY SPACER, BMP, AUTOGRAFT, LATERAL FUSION USING SYNTHES SCREWS    THUMB ARTHROSCOPY  1995    TONSILLECTOMY AND ADENOIDECTOMY  1972       CURRENT MEDICATIONS       Previous Medications    CYCLOBENZAPRINE (FLEXERIL) 10 MG TABLET    Take 10 mg by mouth 3 times daily as needed for Muscle spasms    LACTULOSE (CHRONULAC) 10 GM/15ML SOLUTION    Take by mouth daily as needed  OXYCODONE-ACETAMINOPHEN (PERCOCET) 7.5-325 MG PER TABLET        SENNOSIDES-DOCUSATE SODIUM (SENOKOT-S) 8.6-50 MG TABLET    Take 1 tablet by mouth 2 times daily       ALLERGIES     Codeine, Dilaudid [hydromorphone hcl], Duloxetine hcl, Gabapentin, Hydrocodone, Pregabalin, and Tramadol hcl    FAMILY HISTORY       Family History   Problem Relation Age of Onset    Heart Failure Father     Diabetes Father     Diabetes Paternal Grandmother     Heart Failure Paternal Grandfather         SOCIAL HISTORY       Social History     Socioeconomic History    Marital status: Married     Spouse name: None    Number of children: None    Years of education: None    Highest education level: None   Tobacco Use    Smoking status: Never    Smokeless tobacco: Never   Substance and Sexual Activity    Alcohol use: Never    Drug use: Never       SCREENINGS         Glasgow Coma Scale  Eye Opening: Spontaneous  Best Verbal Response: Oriented  Best Motor Response: Obeys commands  Glasgow Coma Scale Score: 15                     CIWA Assessment  BP:  (!) 102/58  Pulse: 100                 PHYSICAL EXAM    (up to 7 for level 4, 8 or more for level 5)     ED Triage Vitals [10/24/22 2330]   BP Temp Temp Source Pulse Respirations SpO2 Height Weight   112/73 99.4 F (37.4 C) Oral 100 16 99 % -- --       Physical Exam  Vitals and nursing note reviewed.   Constitutional:       Appearance: Normal appearance.   HENT:      Head: Normocephalic.      Right Ear: Tympanic membrane normal.      Left Ear: Tympanic membrane normal.      Nose: Nose normal.      Mouth/Throat:      Mouth: Mucous membranes are moist.      Pharynx: Oropharynx is clear.   Eyes:      Extraocular Movements: Extraocular movements intact.      Pupils: Pupils are equal, round, and reactive to light.   Cardiovascular:      Rate and Rhythm: Normal rate and regular rhythm.      Pulses: Normal pulses.      Heart sounds: Normal heart sounds.   Pulmonary:      Effort: Pulmonary effort is normal.      Breath sounds: Normal breath sounds.   Abdominal:      General: Abdomen is flat. Bowel sounds are normal.      Palpations: Abdomen is soft.          Comments: Midepigastric abdominal pain on exam.  Mild guarding.  No distention.  Normal bowel sounds.  No lower abdominal pain.   Musculoskeletal:         General: No tenderness or signs of injury. Normal range of motion.      Cervical back: Normal range of motion and neck supple.   Skin:     General:  Skin is warm and dry.   Neurological:      General: No focal deficit present.      Mental Status: He is alert and oriented to person, place, and time.   Psychiatric:         Mood and Affect: Mood normal.         Behavior: Behavior normal.         DIAGNOSTIC RESULTS       PROCEDURES:  Unless otherwise noted below, none     Procedures    EKG: All EKG's are interpreted by the Emergency Department Physician who either signs or Co-signs this chart in the absence of a cardiologist.    LABS:  Labs Reviewed   COMPREHENSIVE METABOLIC PANEL - Abnormal; Notable for the following  components:       Result Value    Sodium 129 (*)     Chloride 95 (*)     Glucose 115 (*)     Osmolaliy Calculated 260 (*)     Albumin 3.1 (*)     Albumin/Globulin Ratio 0.86 (*)     Alk Phosphatase 167 (*)     All other components within normal limits   CBC WITH AUTO DIFFERENTIAL - Abnormal; Notable for the following components:    WBC 14.0 (*)     Hemoglobin 10.8 (*)     Hematocrit 33.0 (*)     MCV 82.3 (*)     MCH 26.9 (*)     Neutrophils % 76.0 (*)     Lymphocytes 8.1 (*)     Monocytes % 15.0 (*)     Neutrophils Absolute 10.6 (*)     Monocytes Absolute 2.1 (*)     All other components within normal limits   COVID-19 & INFLUENZA COMBO [facility])    Narrative:     Is this test for diagnosis or screening?->Diagnosis of ill patient  Symptomatic for COVID-19 as defined by CDC?->Yes  Date of Symptom Onset->10/18/22  Hospitalized for COVID-19?->No  Admitted to ICU for COVID-19?->No  Previously tested for COVID-19?->Yes  Pregnant:->No   CULTURE, BLOOD 1   CULTURE, BLOOD 2   LACTIC ACID   URINALYSIS W/ RFLX MICROSCOPIC   LIPASE   PROCALCITONIN   ADD ON LAB TEST   ADD ON LAB TEST   ADD ON LAB TEST       All other labs were within normal range or not returned as of this dictation.    RADIOLOGY:   Non-plain film images such as CT, Ultrasound and MRI are read by the radiologist. Plain radiographic images are visualized and preliminarily interpreted by the emergency physician with the below findings:    Interpretation per the Radiologist below, if available at the time of this note:    CT ABDOMEN PELVIS W IV CONTRAST Additional Contrast? None    (Results Pending)   XR CHEST PORTABLE    (Results Pending)   CTA Chest W/WO Contrast PE Eval    (Results Pending)         EMERGENCY DEPARTMENT COURSE/REASSESSMENT and MDM:   Vitals:    Vitals:    10/25/22 0133 10/25/22 0134 10/25/22 0200 10/25/22 0230   BP: 103/68  106/69 (!) 102/58   Pulse:       Resp:       Temp:       TempSrc:       SpO2: 95% 94%  94%       Medical Decision  Making  Had a  very lengthy discussion with the patient regarding all test results.  I discussed in detail CT findings.  I discussed my conversation with the oncologist and the recommendation for transfer and admission.  Patient states that he needs to go home and talk to his wife before he gets admitted to the hospital.  Patient states that this is non-negotiable.  Patient states that he will sign out AMA and will follow-up with [facility] ER for admission.  I discussed this in detail with the ER attending.    Amount and/or Complexity of Data Reviewed  External Data Reviewed: labs.  Labs: ordered.  Radiology: ordered.  Discussion of management or test interpretation with external provider(s): I discussed in detail patient history physical test results with the oncologist Dr. Durwin Reges    Risk  Prescription drug management.  Decision regarding hospitalization.  Risk Details: I did advise the patient of the oncology recommendation for patient transfer and admission.  Patient deferring admission at this time.  Patient will sign out AMA.        ED Course:    ED Course as of 10/25/22 0431   Sat Oct 25, 2022   0305 I had a Very lengthy discussion with the patient regarding CT abdomen/pelvis findings.  I will scan his the chest as well due to concerning chest x-ray. [PF]   0404 I had a lengthy Discussion with the oncologist Dr. Durwin Reges who is recommending transfer to [facility] downtown and admission for further evaluation. [PF]   0404 I had a very lengthy discussion with the patient again regarding all test results.  Also discussed my conversation with the oncologist and recommendation for transfer and admission.  Patient states at this time he does not want to be admitted but needs to go home and talk to his wife first.  Patient states he will check back into the ER at [facility] downtown for admission.  Patient states that this was nonnegotiable.  Patient will sign out [facility] [PF]      ED Course User Index  [PF]  Mora Bellman, PA-C         FINAL IMPRESSION      1. Gastric mass    2. 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 Pulmonary nodules    4. Hyponatremia    5. Leukocytosis, unspecified type          DISPOSITION/PLAN   DISPOSITION Decision To Discharge 10/25/2022 04:22:12 AM      PATIENT REFERRED TO:  Evalyn Casco., MD  [address]  [address]  [place] [address]  [phone]    In 3 days      [facility]  ER  Patient is to follow-up with [facility] for reevaluation and for admission.  For metastatic disease  Today        DISCHARGE MEDICATIONS:  New Prescriptions    No medications on file     Controlled Substances Monitoring:          No data to display                (Please note that portions of this note were completed with a voice recognition program.  Efforts were made to edit the dictations but occasionally words are mis-transcribed.)    Mora Bellman, PA-C (electronically signed)  Attending Emergency Physician          Mora Bellman, PA-C  10/25/22 [phone]

## 2022-10-25 ENCOUNTER — Emergency Department: Admit: 2022-10-25 | Payer: MEDICARE | Primary: Internal Medicine

## 2022-10-25 ENCOUNTER — Inpatient Hospital Stay: Admit: 2022-10-25 | Discharge: 2022-10-25 | Disposition: A | Discharge status: Home / Self Care | Payer: MEDICARE

## 2022-10-25 DIAGNOSIS — K3189 Other diseases of stomach and duodenum: Secondary | ICD-10-CM

## 2022-10-25 LAB — CBC WITH AUTO DIFFERENTIAL
Basophils %: 0.4 % (ref 0.0–2.0)
Basophils Absolute: 0.1 10*3/uL (ref 0.0–0.2)
Eosinophils %: 0.1 % (ref 0.0–7.0)
Eosinophils Absolute: 0 10*3/uL (ref 0.0–0.5)
Hematocrit: 33 % — ABNORMAL LOW (ref 38.0–52.0)
Hemoglobin: 10.8 g/dL — ABNORMAL LOW (ref 13.0–17.3)
Immature Grans (Abs): 0.05 10*3/uL (ref 0.00–0.06)
Immature Granulocytes %: 0.4 % (ref 0.0–0.6)
Lymphocytes Absolute: 1.1 10*3/uL (ref 1.0–3.2)
Lymphocytes: 8.1 % — ABNORMAL LOW (ref 15.0–45.0)
MCH: 26.9 pg — ABNORMAL LOW (ref 27.0–34.5)
MCHC: 32.7 g/dL (ref 32.0–36.0)
MCV: 82.3 fL — ABNORMAL LOW (ref 84.0–100.0)
MPV: 8.7 fL (ref 7.2–13.2)
Monocytes %: 15 % — ABNORMAL HIGH (ref 4.0–12.0)
Monocytes Absolute: 2.1 10*3/uL — ABNORMAL HIGH (ref 0.3–1.0)
NRBC Absolute: 0 10*3/uL (ref 0.000–0.012)
NRBC Automated: 0 % (ref 0.0–0.2)
Neutrophils %: 76 % — ABNORMAL HIGH (ref 42.0–74.0)
Neutrophils Absolute: 10.6 10*3/uL — ABNORMAL HIGH (ref 1.6–7.3)
Platelets: 438 10*3/uL (ref 140–440)
RBC: 4.01 x10e6/mcL (ref 4.00–5.60)
RDW: 12.9 % (ref 11.0–16.0)
WBC: 14 10*3/uL — ABNORMAL HIGH (ref 3.8–10.6)

## 2022-10-25 LAB — COMPREHENSIVE METABOLIC PANEL
ALT: 22 U/L (ref 0–50)
AST: 43 U/L (ref 0–50)
Albumin/Globulin Ratio: 0.86 — ABNORMAL LOW (ref 1.00–2.70)
Albumin: 3.1 g/dL — ABNORMAL LOW (ref 3.5–5.2)
Alk Phosphatase: 167 U/L — ABNORMAL HIGH (ref 40–130)
Anion Gap: 12 mmol/L (ref 2–17)
BUN: 14 mg/dL (ref 8–23)
CALCIUM,CORRECTED,CCA: 9.4 mg/dL (ref 8.5–10.7)
CO2: 22 mmol/L (ref 22–29)
Calcium: 8.7 mg/dL (ref 8.5–10.7)
Chloride: 95 mmol/L — ABNORMAL LOW (ref 98–107)
Creatinine: 0.8 mg/dL (ref 0.7–1.3)
Est, Glom Filt Rate: 100 mL/min/1.73m?? (ref >=60)
Globulin: 3.6 g/dL (ref 1.9–4.4)
Glucose: 115 mg/dL — ABNORMAL HIGH (ref 70–99)
Osmolaliy Calculated: 260 mOsm/kg — ABNORMAL LOW (ref 270–287)
Potassium: 4.6 mmol/L (ref 3.5–5.3)
Sodium: 129 mmol/L — ABNORMAL LOW (ref 135–145)
Total Bilirubin: 0.3 mg/dL (ref 0.00–1.20)
Total Protein: 6.7 g/dL (ref 5.7–8.3)

## 2022-10-25 LAB — URINALYSIS W/ RFLX MICROSCOPIC
Bilirubin, Urine: NEGATIVE
Blood, Urine: NEGATIVE
Glucose, Ur: NEGATIVE mg/dL
Ketones, Urine: NEGATIVE mg/dL
Leukocyte Esterase, Urine: NEGATIVE
Nitrite, Urine: NEGATIVE
Protein, UA: NEGATIVE
Specific Gravity, UA: 1.02 (ref 1.003–1.035)
Urobilinogen, Urine: 0.2 EU/dL (ref >1.0)
pH, Urine: 6.5 (ref 4.5–8.0)

## 2022-10-25 LAB — COVID-19 & INFLUENZA COMBO (LIAT HOSPITAL)
Influenza A: NOT DETECTED
Influenza B: NOT DETECTED
SARS-CoV-2: NOT DETECTED

## 2022-10-25 LAB — PROCALCITONIN: Procalcitonin: 0.19 ng/mL (ref <=0.24)

## 2022-10-25 LAB — LIPASE: Lipase: 20 U/L (ref 13–60)

## 2022-10-25 LAB — LACTIC ACID: Lactic Acid: 0.9 mmol/L (ref 0.5–2.0)

## 2022-10-25 MED ORDER — IOPAMIDOL 61 % IV SOLN
61 | Freq: Once | INTRAVENOUS | Status: AC | PRN
Start: 2022-10-25 — End: 2022-10-25
  Administered 2022-10-25: 05:00:00 100 mL via INTRAVENOUS

## 2022-10-25 MED ORDER — IOPAMIDOL 76 % IV SOLN
76 | Freq: Once | INTRAVENOUS | Status: AC | PRN
Start: 2022-10-25 — End: 2022-10-25
  Administered 2022-10-25: 07:00:00 100 mL via INTRAVENOUS

## 2022-10-25 MED ORDER — SODIUM CHLORIDE 0.9 % IV BOLUS
0.9 | Freq: Once | INTRAVENOUS | Status: AC
Start: 2022-10-25 — End: 2022-10-25
  Administered 2022-10-25: 05:00:00 1000 mL via INTRAVENOUS

## 2022-10-25 NOTE — Other (Signed)
Called pt, informed of test results and rx. (See MD notes). Contacted [facility] [phone] ,  rx prescribed ordered. Left on v/m. Pt understands instructions. Pt stated pcp started him on Bactrim for + urine culture of staph epidermidis, I checked with Dr. Malvin Johns and he v/o stated to continue with Bactrim and start Cefdinir.  Will f/u with pcp.

## 2022-10-26 LAB — CULTURE, BLOOD 1

## 2022-10-26 LAB — CULTURE, BLOOD 2

## 2022-10-26 LAB — CULTURE, URINE: FINAL REPORT: NO GROWTH

## 2022-10-27 ENCOUNTER — Emergency Department: Admit: 2022-10-27 | Payer: MEDICARE | Primary: Internal Medicine

## 2022-10-27 ENCOUNTER — Inpatient Hospital Stay
Admit: 2022-10-27 | Discharge: 2022-10-27 | Disposition: A | Discharge status: Home / Self Care | Payer: MEDICARE | Attending: Emergency Medicine

## 2022-10-27 DIAGNOSIS — R0600 Dyspnea, unspecified: Secondary | ICD-10-CM

## 2022-10-27 DIAGNOSIS — E871 Hypo-osmolality and hyponatremia: Secondary | ICD-10-CM

## 2022-10-27 LAB — CBC WITH AUTO DIFFERENTIAL
Basophils %: 0.4 % (ref 0.0–2.0)
Basophils Absolute: 0.1 10*3/uL (ref 0.0–0.2)
Eosinophils %: 0.1 % (ref 0.0–7.0)
Eosinophils Absolute: 0 10*3/uL (ref 0.0–0.5)
Hematocrit: 35.2 % — ABNORMAL LOW (ref 38.0–52.0)
Hemoglobin: 11.2 g/dL — ABNORMAL LOW (ref 13.0–17.3)
Immature Grans (Abs): 0.1 10*3/uL — ABNORMAL HIGH (ref 0.00–0.06)
Immature Granulocytes %: 0.6 % (ref 0.0–0.6)
Lymphocytes Absolute: 1.2 10*3/uL (ref 1.0–3.2)
Lymphocytes: 7.7 % — ABNORMAL LOW (ref 15.0–45.0)
MCH: 26.5 pg — ABNORMAL LOW (ref 27.0–34.5)
MCHC: 31.8 g/dL — ABNORMAL LOW (ref 32.0–36.0)
MCV: 83.4 fL — ABNORMAL LOW (ref 84.0–100.0)
MPV: 9.1 fL (ref 7.2–13.2)
Monocytes %: 12.9 % — ABNORMAL HIGH (ref 4.0–12.0)
Monocytes Absolute: 2 10*3/uL — ABNORMAL HIGH (ref 0.3–1.0)
NRBC Absolute: 0 10*3/uL (ref 0.000–0.012)
NRBC Automated: 0 % (ref 0.0–0.2)
Neutrophils %: 78.3 % — ABNORMAL HIGH (ref 42.0–74.0)
Neutrophils Absolute: 12.2 10*3/uL — ABNORMAL HIGH (ref 1.6–7.3)
Platelets: 549 10*3/uL — ABNORMAL HIGH (ref 140–440)
RBC: 4.22 x10e6/mcL (ref 4.00–5.60)
RDW: 13.2 % (ref 11.0–16.0)
WBC: 15.6 10*3/uL — ABNORMAL HIGH (ref 3.8–10.6)

## 2022-10-27 LAB — BASIC METABOLIC PANEL W/ REFLEX TO MG FOR LOW K
Anion Gap: 14 mmol/L (ref 2–17)
BUN: 14 mg/dL (ref 8–23)
CO2: 23 mmol/L (ref 22–29)
Calcium: 8.9 mg/dL (ref 8.5–10.7)
Chloride: 92 mmol/L — ABNORMAL LOW (ref 98–107)
Creatinine: 0.9 mg/dL (ref 0.7–1.3)
Est, Glom Filt Rate: 97 mL/min/1.73m (ref >=60)
Glucose: 94 mg/dL (ref 70–99)
Osmolaliy Calculated: 259 mOsm/kg — ABNORMAL LOW (ref 270–287)
Potassium: 4.3 mmol/L (ref 3.5–5.3)
Sodium: 129 mmol/L — ABNORMAL LOW (ref 135–145)

## 2022-10-27 NOTE — ED Provider Notes (Signed)
[facility] EMERGENCY DEPT  EMERGENCY DEPARTMENT ENCOUNTER      Pt Name: Jesse Savage  MRN: [id number]  Birthdate Apr 25, 1961  Date of evaluation: 10/27/2022  Provider evaluation time: 10/27/22 1057  Provider: Fenton Foy, MD    CHIEF COMPLAINT       Chief Complaint   Patient presents with    Shortness of Breath     Pt states he was in the ER on Saturday & was told he has a mass on his lung & stomach  Pt c/o sob & abdominal pain         HISTORY OF PRESENT ILLNESS      62 year old male.  Patient presenting here to the hospital for admission.  He was seen a couple days ago at [facility] and diagnosed with what appears to be a tumor that has metastatic components.  Patient did not want to get admitted at that time despite the advice from on-call oncology.  Today, the patient presents for admission to this hospital.  No change in his shortness of breath.  He is not vomiting.  No reported fevers.          Nursing Notes were reviewed.    REVIEW OF SYSTEMS       Review of Systems  As Per HPI        PAST MEDICAL HISTORY     Past Medical History:   Diagnosis Date    Back pain     Lumbar disc disease     OSA (obstructive sleep apnea)     Sciatica          SURGICAL HISTORY       Past Surgical History:   Procedure Laterality Date    BACK SURGERY  06/2010    L5-S1 REMOVAL OF SCREWS    CERVICAL SPINE SURGERY  08/26/2019    C5-C6 ACDF    FACIAL SURGERY  1981    HERNIA REPAIR  2002    LUMBAR FUSION  04/22/2011    L4-L5 PLIF USING DePUY SPACER, BMP, AUTOGRAFT, LATERAL FUSION USING SYNTHES SCREWS    THUMB ARTHROSCOPY  1995    TONSILLECTOMY AND ADENOIDECTOMY  1972         CURRENT MEDICATIONS       Previous Medications    CYCLOBENZAPRINE (FLEXERIL) 10 MG TABLET    Take 10 mg by mouth 3 times daily as needed for Muscle spasms    LACTULOSE (CHRONULAC) 10 GM/15ML SOLUTION    Take by mouth daily as needed    OXYCODONE-ACETAMINOPHEN (PERCOCET) 7.5-325 MG PER TABLET        SENNOSIDES-DOCUSATE SODIUM (SENOKOT-S) 8.6-50 MG TABLET     Take 1 tablet by mouth 2 times daily       ALLERGIES     Codeine, Dilaudid [hydromorphone hcl], Duloxetine hcl, Gabapentin, Hydrocodone, Pregabalin, and Tramadol hcl    FAMILY HISTORY       Family History   Problem Relation Age of Onset    Heart Failure Father     Diabetes Father     Diabetes Paternal Grandmother     Heart Failure Paternal Grandfather           SOCIAL HISTORY       Social History     Socioeconomic History    Marital status: Married     Spouse name: None    Number of children: None    Years of education: None    Highest education level: None   Tobacco  Use    Smoking status: Never    Smokeless tobacco: Never   Substance and Sexual Activity    Alcohol use: Never    Drug use: Never         PHYSICAL EXAM       ED Triage Vitals [10/27/22 1101]   BP Temp Temp Source Pulse Respirations SpO2 Height Weight - Scale   139/84 98.3 F (36.8 C) Oral 90 18 98 % 1.753 m (5\' 9" ) 73.5 kg (162 lb)       Physical Exam  General: Patient is awake, alert, and in no acute distress  Skin: Warm, dry, no rashes  Head: Normocephalic and atraumatic  Eyes: Extraocular movements are intact, conjunctiva are nonicteric  ENMT: Oral mucosa is moist  Cardiovascular: Regular rate and rhythm, normal peripheral perfusion  Respiratory: Symmetrical expansion, respirations unlabored  Musculoskeletal: No deformity, normal strength  Neurological: Symmetrical face, sensation and motor grossly intact  Psychiatric: Patient is cooperative, there is an appropriate mood and affect      DIAGNOSTIC RESULTS       Interpretation per the Radiologist below, if available at the time of this note:    XR CHEST PORTABLE   Final Result   No evidence of acute cardiac pulmonary process.   Known bilateral lower lung pulmonary nodules are better evaluated on the recent    chest CT.            ED BEDSIDE ULTRASOUND:   Performed by ED Physician - none    LABS:  Labs Reviewed   BASIC METABOLIC PANEL W/ REFLEX TO MG FOR LOW K - Abnormal; Notable for the following  components:       Result Value    Sodium 129 (*)     Chloride 92 (*)     Osmolaliy Calculated 259 (*)     All other components within normal limits   CBC WITH AUTO DIFFERENTIAL - Abnormal; Notable for the following components:    WBC 15.6 (*)     Hemoglobin 11.2 (*)     Hematocrit 35.2 (*)     MCV 83.4 (*)     MCH 26.5 (*)     MCHC 31.8 (*)     Platelets 549 (*)     Neutrophils % 78.3 (*)     Lymphocytes 7.7 (*)     Monocytes % 12.9 (*)     Neutrophils Absolute 12.2 (*)     Monocytes Absolute 2.0 (*)     Immature Grans (Abs) 0.10 (*)     All other components within normal limits       All other labs were within normal range or not returned as of this dictation.    EMERGENCY DEPARTMENT COURSE and DIFFERENTIAL DIAGNOSIS/MDM:   Vitals:    Vitals:    10/27/22 1101 10/27/22 1110 10/27/22 1130 10/27/22 1131   BP: 139/84 128/74 123/76    Pulse: 90 80  78   Resp: 18 13  17    Temp: 98.3 F (36.8 C)      TempSrc: Oral      SpO2: 98% 99%  99%   Weight: 73.5 kg (162 lb)      Height: 1.753 m (5\' 9" )            MDM  Patient overall appears well.  We will draw some screening labs for him.  Also get chest x-ray.  Will talk to hospitalist service for admission.      REASSESSMENT  ED Course as of 10/27/22 1207   Mon Oct 27, 2022   1206 Patient's lab work seems consistent with what it has been over the past couple of days.  Chest x-ray without any acute process.  Spoke with hospitalist service who spoke with oncologist, Dr. Durwin Reges.  Actually no current indication to admit the patient at this time.  Oncologist office will reach out to the patient to make a follow-up appointment. [WR]      ED Course User Index  [WR] Fenton Foy, MD           CONSULTS:  None    PROCEDURES:  Unless otherwise noted below, none     Procedures        FINAL IMPRESSION      1. Dyspnea, unspecified type    2. Gastric mass    3. Hyponatremia          DISPOSITION/PLAN   DISPOSITION Decision To Discharge 10/27/2022 12:02:08 PM      PATIENT REFERRED  TO:  Evalyn Casco., MD  [address]  [address]  [place] [address]  [phone]    Call in 2 days        DISCHARGE MEDICATIONS:  New Prescriptions    No medications on file         (Please note that portions of this note were completed with a voice recognition program.  Efforts were made to edit the dictations but occasionally words are mis-transcribed.)    Fenton Foy, MD (electronically signed)  Attending Emergency Physician            Fenton Foy, MD  10/27/22 [phone]

## 2022-10-29 ENCOUNTER — Ambulatory Visit
Admit: 2022-10-29 | Discharge: 2022-10-29 | Payer: MEDICARE | Attending: Hematology & Oncology | Primary: Internal Medicine

## 2022-10-29 ENCOUNTER — Other Ambulatory Visit: Payer: Self-pay | Admitting: Cardiovascular Disease

## 2022-10-29 DIAGNOSIS — K3189 Other diseases of stomach and duodenum: Secondary | ICD-10-CM

## 2022-10-29 DIAGNOSIS — C169 Malignant neoplasm of stomach, unspecified: Secondary | ICD-10-CM

## 2022-10-29 MED ORDER — OXYCODONE HCL ER 10 MG PO T12A: 10 mg | ORAL | 0 refills | Status: DC

## 2022-10-29 MED ORDER — OLANZAPINE 5 MG PO TABS: 5 mg | ORAL | 3 refills | Status: DC

## 2022-10-29 NOTE — Progress Notes (Signed)
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  He is accompanied by his wife.  Over the past 2 months, he has developed worsening fatigue, a 20 pound unexplained weight loss, abdominal discomfort, and low-grade fevers.  He was recently prescribed an antibiotic for what was thought to be a urinary tract infection, but his fevers persisted which prompted evaluation in the emergency department over the weekend.  CT imaging was obtained in the emergency department which showed a gastric mass measuring 4.2 x 4 cm with multiple hepatic masses as well as enlarged celiac axis lymph nodes and pulmonary nodules.  He was advised to present for short-term follow-up in our clinic for further assessment and management.  He underwent endoscopic evaluation yesterday with Dr. Darrin Luis.  There was a large ulcerated mass of the mucosa in the cardia, and biopsies were obtained.  He states that his abdominal discomfort has continued and is worsened by deep inspiration.  He continues to experience  fevers as high as 101.8 F associated with malaise.  He denies nausea or vomiting but is struggling with constipation.  He has not had a bowel movement since Friday, and his last stool was dark.  He struggles with chronic back and neck pain and has had multiple surgeries for degenerative disc disease.  He takes Percocet for pain control, but his abdominal pain has persisted despite regular Percocet use.  He has no history of tobacco use but does admit to former heavy EtOH use.  He has never had a colonoscopy.  His family history is notable for bladder cancer in his father.    PAST MEDICAL HISTORY:  Past Medical  History:   Diagnosis Date    Back pain     Lumbar disc disease     OSA (obstructive sleep apnea)     Sciatica        PAST SURGICAL HISTORY:  Past Surgical History:   Procedure Laterality Date    BACK SURGERY  06/2010    L5-S1 REMOVAL OF SCREWS    CERVICAL SPINE SURGERY  08/26/2019    C5-C6 ACDF    FACIAL SURGERY  1981    HERNIA REPAIR  2002    LUMBAR FUSION  04/22/2011    L4-L5 PLIF USING DePUY SPACER, BMP, AUTOGRAFT, LATERAL FUSION USING SYNTHES SCREWS    THUMB ARTHROSCOPY  1995    TONSILLECTOMY AND ADENOIDECTOMY  1972       CURRENT MEDICATIONS:  Current Outpatient Medications   Medication Sig Dispense Refill    oxyCODONE-acetaminophen (PERCOCET) 7.5-325 MG per tablet       cyclobenzaprine (FLEXERIL) 10 MG tablet Take 10 mg by mouth 3 times daily as needed for Muscle spasms      sennosides-docusate sodium (SENOKOT-S) 8.6-50 MG tablet Take 1 tablet by mouth 2 times daily      lactulose (CHRONULAC) 10 GM/15ML solution Take by mouth daily as needed       No current facility-administered medications for this visit.        ALLERGIES  Codeine, Dilaudid [hydromorphone hcl], Duloxetine hcl, Gabapentin, Hydrocodone, Pregabalin, and Tramadol hcl    SOCIAL HISTORY:  Social History     Socioeconomic  History    Marital status: Married   Tobacco Use    Smoking status: Never    Smokeless tobacco: Never   Substance and Sexual Activity    Alcohol use: Never    Drug use: Never        FAMILY HISTORY:  Family History   Problem Relation Age of Onset    Heart Failure Father     Diabetes Father     Diabetes Paternal Grandmother     Heart Failure Paternal Grandfather         REVIEW OF SYSTEMS:  A comprehensive 12 system review of systems is negative with the exception of pertinent findings below:   Constitutional: 20 pound weight loss, fevers, malaise   Eyes:  No diplopia, no transient or permanent loss of vision, no scotomata   ENT/Mouth:  No tinnitus, normal hearing, no epistaxis, no hoarseness, no oral ulcers, no gingival bleeding, no  sore throat, no post nasal drip, no nasal drip, no mouth pain, no sinus pain, no dysphagia   Cardiovascular:  No chest pain, no palpitations, no syncope, no upper extremity edema, no lower extremity edema, no calf discomfort   Respiratory: Dyspnea with exertion; no cough, no hemoptysis, no pleurisy, no wheezing   Gastrointestinal: Upper abdominal pain exacerbated by deep inspiration, constipation; no nausea, no vomiting, no hematochezia, no jaundice, no abdominal cramping, no hematemesis, no diarrhea, no melena, no dyspepsia, no dysphagia   Urinary:  No dysuria, no hematuria, no nocturia, no urinary incontinence   Musculoskeletal: Chronic neck and back pain   Skin:  No rash, no nodules, no pruritus, no lesions   Neurologic: Left upper extremity and  and left lower extremity tremors; no confusion, no seizure, no syncope, no speech change, no headache, no hiccups, no abnormal gate, no weakness, no sensory changes   Psychiatric:  No depression, no anxiety, concentration normal   Endocrine:  No polyuria, no polydipsia, no thyroid disease symptoms, no hot flashes   Hematologic:  No epistaxis, no gingival bleeding, no petechiae, no ecchymoses   Lymphatic:  No lymphadenopathy, no lymphedema   Allergy/Immunologic:  No eczema, no frequent mucous membrane infections, no frequent respiratory infections, no recurrent urticaria, no frequent skin infections       VITAL SIGNS:    BP 107/70 (Site: Right Upper Arm, Position: Sitting, Cuff Size: Medium Adult)   Pulse 81   Temp 97.7 F (36.5 C) (Temporal)   Ht 1.753 m (5\' 9" )   Wt 72.7 kg (160 lb 3.2 oz)   SpO2 97%   BMI 23.66 kg/m     PHYSICAL EXAM:  Constitutional: Alert no apparent distress, appears stated age  Eyes:  Conjunctiva normal; eyelids normal; PERRLA; sclera anicteric  Ear, Nose, Throat:  External ears and nose normal; hearing grossly normal; oropharynx unremarkable  Neck:  Supple; no masses; no adenopathy  Respiratory: Lungs clear to auscultation bilaterally,  symmetric chest rise, nonlabored respirations  Cardiovascular:  Normal heart sounds; regular rate and rhythm; no peripheral edema  Abdomen: Abdomen mildly distended, hypoactive bowel sounds, tender to palpation of right upper and left upper quadrants  Lymph nodes:  No cervical adenopathy; no axillary adenopathy; no supraclavicular adenopathy; no inguinal adenopathy  Skin:  No rash; no petechiae; no ecchymoses; no pallor; no cutaneous nodules  Musculoskeletal:  Normal muscle strength  Neurologic:  No focal motor or sensory deficit; cranial nerves intact; normal gait; no abnormal mental status  Psychiatric:  Oriented to person time and place; mood and affect appropriate to situation;  appropriate judgment and insight; memory intact    LABORATORY DATA:  No results found for this or any previous visit (from the past 24 hour(s)).     Impression & Plan:    I interviewed and examined the patient in conjunction with Loura Pardon, NP.  I concur with the history and exam as outlined above.    Impression: 62 year old gentleman with a radiographically apparent gastric mass, hepatic and pulmonary lesions consistent with advanced, metastatic gastric carcinoma.  He underwent EGD and biopsy by Dr. Darrin Luis on Monday.  The path is pending.    I had a lengthy and frank discussion with the patient and his wife that I feel strongly that this represents metastatic gastric carcinoma.  I discussed the incurable but treatable nature of this disease.  I am concerned regarding abdominal pain and tenderness possibly reflecting peritoneal disease.  It would be worthwhile to obtain a PET scan which will be ordered.  He will require port placement for chemotherapy which will be arranged.  I anticipate FOLFOX-based chemotherapy initially, pending the confirmatory path.    I suspect that the fever is "tumor fever" as is seen in patients with solid tumors, particularly with hepatic metastases.  Accordingly I have recommended that he begin Aleve  twice daily.    His pain is inadequately addressed with 7.5 mg Percocet.  He is to begin OxyContin 10 mg twice daily and a prescription will be sent.  He will tentatively return in 1 week, anticipating that the path report will be available at that time, for detailed discussion regarding palliative chemotherapy.    Evalyn Casco., MD    Elements of this note have been dictated using speech recognition software. As a result, errors of speech recognition may have occurred.

## 2022-10-30 NOTE — Addendum Note (Signed)
Addended by: Yvonne Kendall on: 10/30/2022 01:36 PM     Modules accepted: Orders

## 2022-10-31 ENCOUNTER — Inpatient Hospital Stay: Admit: 2022-10-31 | Discharge: 2022-10-31 | Payer: MEDICARE | Primary: Internal Medicine

## 2022-10-31 ENCOUNTER — Ambulatory Visit: Payer: MEDICARE | Primary: Internal Medicine

## 2022-10-31 ENCOUNTER — Inpatient Hospital Stay: Admit: 2022-10-31 | Payer: MEDICARE | Primary: Internal Medicine

## 2022-10-31 DIAGNOSIS — C7889 Secondary malignant neoplasm of other digestive organs: Secondary | ICD-10-CM

## 2022-10-31 DIAGNOSIS — K3189 Other diseases of stomach and duodenum: Secondary | ICD-10-CM

## 2022-10-31 DIAGNOSIS — C169 Malignant neoplasm of stomach, unspecified: Secondary | ICD-10-CM

## 2022-10-31 LAB — PROTIME-INR
INR: 1.1 — ABNORMAL LOW (ref 1.5–3.5)
Protime: 14.4 seconds (ref 11.6–14.5)

## 2022-10-31 LAB — CBC
Hematocrit: 28.4 % — ABNORMAL LOW (ref 38.0–52.0)
Hemoglobin: 8.9 g/dL — ABNORMAL LOW (ref 13.0–17.3)
MCH: 26.7 pg — ABNORMAL LOW (ref 27.0–34.5)
MCHC: 31.3 g/dL — ABNORMAL LOW (ref 32.0–36.0)
MCV: 85.3 fL (ref 84.0–100.0)
MPV: 9.2 fL (ref 7.2–13.2)
Platelets: 476 10*3/uL — ABNORMAL HIGH (ref 140–440)
RBC: 3.33 x10e6/mcL — ABNORMAL LOW (ref 4.00–5.60)
RDW: 13.2 % (ref 11.0–16.0)
WBC: 9.7 10*3/uL (ref 3.8–10.6)

## 2022-10-31 MED ORDER — FENTANYL CITRATE (PF) 100 MCG/2ML IJ SOLN
100 | INTRAMUSCULAR | Status: AC
Start: 2022-10-31 — End: ?

## 2022-10-31 MED ORDER — HEPARIN NA (PORK) LOCK FLSH PF 100 UNIT/ML IV SOLN
100 | INTRAVENOUS | Status: AC
Start: 2022-10-31 — End: ?

## 2022-10-31 MED ORDER — LIDOCAINE-EPINEPHRINE 2 %-1:100000 IJ SOLN
INTRAMUSCULAR | Status: AC | PRN
Start: 2022-10-31 — End: 2022-10-31
  Administered 2022-10-31: 20:00:00 12 via INTRADERMAL

## 2022-10-31 MED ORDER — FENTANYL CITRATE (PF) 100 MCG/2ML IJ SOLN
100 | INTRAMUSCULAR | Status: AC | PRN
Start: 2022-10-31 — End: 2022-10-31
  Administered 2022-10-31: 20:00:00 50 via INTRAVENOUS
  Administered 2022-10-31 (×2): 25 via INTRAVENOUS

## 2022-10-31 MED ORDER — MIDAZOLAM HCL 2 MG/2ML IJ SOLN
2 | INTRAMUSCULAR | Status: AC | PRN
Start: 2022-10-31 — End: 2022-10-31
  Administered 2022-10-31: 20:00:00 .5 via INTRAVENOUS
  Administered 2022-10-31: 20:00:00 1 via INTRAVENOUS
  Administered 2022-10-31: 20:00:00 .5 via INTRAVENOUS

## 2022-10-31 MED ORDER — FLUDEOXYGLUCOSE F 18 INJECTION
Freq: Once | Status: AC | PRN
Start: 2022-10-31 — End: 2022-10-31
  Administered 2022-10-31: 12:00:00 10 via INTRAVENOUS

## 2022-10-31 MED ORDER — HEPARIN (PORCINE) IN NACL 1000-0.9 UT/500ML-% IV SOLN
INTRAVENOUS | Status: AC
Start: 2022-10-31 — End: ?

## 2022-10-31 MED ORDER — LIDOCAINE-EPINEPHRINE 1 %-1:100000 IJ SOLN
1 | INTRAMUSCULAR | Status: AC
Start: 2022-10-31 — End: ?

## 2022-10-31 MED ORDER — MIDAZOLAM HCL 2 MG/2ML IJ SOLN
2 | INTRAMUSCULAR | Status: AC
Start: 2022-10-31 — End: ?

## 2022-10-31 MED ORDER — ACETAMINOPHEN 325 MG PO TABS
325 | ORAL | Status: DC | PRN
Start: 2022-10-31 — End: 2022-11-04

## 2022-10-31 MED ORDER — SODIUM CHLORIDE 0.9 % IV SOLN
0.9 | INTRAVENOUS | Status: DC
Start: 2022-10-31 — End: 2022-11-04

## 2022-10-31 MED ORDER — CEFAZOLIN SODIUM 2 G SOLR (MIXTURES ONLY)
2 | Freq: Once | INTRAVENOUS | Status: AC
Start: 2022-10-31 — End: 2022-10-31
  Administered 2022-10-31: 20:00:00 2000 mg via INTRAVENOUS

## 2022-10-31 MED FILL — FENTANYL CITRATE (PF) 100 MCG/2ML IJ SOLN: 100 MCG/2ML | INTRAMUSCULAR | Qty: 2

## 2022-10-31 MED FILL — CEFAZOLIN SODIUM 2 G IJ SOLR: 2 g | INTRAMUSCULAR | Qty: 2000

## 2022-10-31 MED FILL — MIDAZOLAM HCL 2 MG/2ML IJ SOLN: 2 MG/ML | INTRAMUSCULAR | Qty: 2

## 2022-10-31 MED FILL — HEPARIN NA (PORK) LOCK FLSH PF 100 UNIT/ML IV SOLN: 100 UNIT/ML | INTRAVENOUS | Qty: 5

## 2022-10-31 MED FILL — LIDOCAINE-EPINEPHRINE 1 %-1:100000 IJ SOLN: 1 %-:00000 | INTRAMUSCULAR | Qty: 20

## 2022-10-31 MED FILL — HEPARIN (PORCINE) IN NACL 1000-0.9 UT/500ML-% IV SOLN: INTRAVENOUS | Qty: 500

## 2022-10-31 NOTE — H&P (Signed)
[facility]  Sedation/Analgesia History & Physical    Pt Name: Jesse Savage  MRN: [id number]  Date of Birth: [birth year]  Provider Performing Procedure: Tana Conch, MD, MD  Primary Care Physician: Fulton Mole, MD    PRE-PROCEDURE   Brief History/Pre-Procedure Diagnosis: Jesse Savage is a 62 y.o. male with a history of metastatic gastric cancer. Patient presents for port placement. After discussion of risks/benefits of procedure, patient consents to procedure.          MEDICAL HISTORY       has a past medical history of Back pain, Lumbar disc disease, OSA (obstructive sleep apnea), and Sciatica.    SURGICAL HISTORY   has a past surgical history that includes lumbar fusion (04/22/2011); back surgery (06/2010); hernia repair (2002); Facial Surgery (1981); Tonsillectomy and adenoidectomy (1972); Cervical spine surgery (08/26/2019); and Thumb arthroscopy (1995).       ALLERGIES   Allergies as of 10/31/2022 - Fully Reviewed 10/31/2022   Allergen Reaction Noted    Codeine  12/09/2020    Dilaudid [hydromorphone hcl]  02/08/2021    Duloxetine hcl  12/09/2020    Gabapentin  12/09/2020    Hydrocodone  12/09/2020    Pregabalin  12/09/2020    Tramadol hcl  12/09/2020          MEDICATIONS     Current Outpatient Medications:     Polyethylene Glycol 3350 (MIRALAX PO), Take 17 g by mouth every morning, Disp: , Rfl:     Naproxen Sodium (ALEVE) 220 MG CAPS, Take 1 tablet by mouth every morning, Disp: , Rfl:     sulfamethoxazole-trimethoprim (BACTRIM DS;SEPTRA DS) 800-160 MG per tablet, , Disp: , Rfl:     cefdinir (OMNICEF) 300 MG capsule, Take 1 capsule by mouth 2 times daily (Patient not taking: Reported on 10/31/2022), Disp: , Rfl:     acetaminophen (TYLENOL) 500 MG tablet, Take 2 tablets by mouth every 6 hours as needed for Pain, Disp: , Rfl:     OLANZapine (ZYPREXA) 5 MG tablet, Take 1 tablet by mouth nightly (Patient not taking: Reported on 10/31/2022), Disp: 30 tablet, Rfl: 3    oxyCODONE (OXYCONTIN) 10 MG  extended release tablet, Take 1 tablet by mouth 2 times daily for 30 days. Intended supply: 30 days Max Daily Amount: 20 mg, Disp: 60 tablet, Rfl: 0    oxyCODONE-acetaminophen (PERCOCET) 7.5-325 MG per tablet, Take 1 tablet by mouth every 6 hours as needed., Disp: , Rfl:     cyclobenzaprine (FLEXERIL) 10 MG tablet, Take 1 tablet by mouth 3 times daily as needed for Muscle spasms, Disp: , Rfl:     sennosides-docusate sodium (SENOKOT-S) 8.6-50 MG tablet, Take 1 tablet by mouth 2 times daily, Disp: , Rfl:     Current Facility-Administered Medications:     ceFAZolin (ANCEF) 2,000 mg in sodium chloride 0.9 % 100 mL IVPB (mini-bag), 2,000 mg, IntraVENous, Once, Fortino Sic, APRN - NP    0.9 % sodium chloride infusion, , IntraVENous, Continuous, Fortino Sic, APRN - NP  Prior to Admission medications    Medication Sig Start Date End Date Taking? Authorizing Provider   Polyethylene Glycol 3350 (MIRALAX PO) Take 17 g by mouth every morning 04/11/21  Yes [provider]   Naproxen Sodium (ALEVE) 220 MG CAPS Take 1 tablet by mouth every morning   Yes [provider]   sulfamethoxazole-trimethoprim (BACTRIM DS;SEPTRA DS) 800-160 MG per tablet  10/23/22   [provider]   cefdinir (  OMNICEF) 300 MG capsule Take 1 capsule by mouth 2 times daily  Patient not taking: Reported on 10/31/2022 10/25/22   [provider]   acetaminophen (TYLENOL) 500 MG tablet Take 2 tablets by mouth every 6 hours as needed for Pain    [provider]   OLANZapine (ZYPREXA) 5 MG tablet Take 1 tablet by mouth nightly  Patient not taking: Reported on 10/31/2022 10/29/22   Yvonne Kendall, APRN - NP   oxyCODONE (OXYCONTIN) 10 MG extended release tablet Take 1 tablet by mouth 2 times daily for 30 days. Intended supply: 30 days Max Daily Amount: 20 mg 10/29/22 11/28/22  Yvonne Kendall, APRN - NP   oxyCODONE-acetaminophen (PERCOCET) 7.5-325 MG per tablet Take 1 tablet by mouth every 6 hours as needed.  04/29/21   [provider]   cyclobenzaprine (FLEXERIL) 10 MG tablet Take 1 tablet by mouth 3 times daily as needed for Muscle spasms    [provider]   sennosides-docusate sodium (SENOKOT-S) 8.6-50 MG tablet Take 1 tablet by mouth 2 times daily    [provider]         VITAL SIGNS   Vitals:    10/31/22 1311   BP: 114/73   Pulse: 81   Resp: 12   Temp: 98.1 F (36.7 C)   SpO2: 96%       PHYSICAL:   General: Alert and oriented, no acute distress.  HEENT: Normocephalic, atraumatic.  Cardiovascular: Regular rate.  Lungs: No increased work of breathing.  Abdomen: Soft, Nontender.  Skin: Warn, dry.  Neuro: Alert and oriented.     SEDATION  ASA Classification:  [] 1 [] 2 [x] 3 [] 4 [] 5  Class 1: A normal healthy patient  Class 2: Pt with mild to moderate systemic disease  Class 3: Severe systemic disease or disturbance  Class 4: Severe systemic disorders that are already life threatening.  Class 5: Moribund pt with little chances of survival, for more than 24 hours.  Mallampati I Airway Classification:   [] 1 [x] 2 [] 3 [] 4      The patient is an appropriate candidate to undergo the planned procedure sedation and anesthesia. (Refer to nursing sedation/analgesia documentation record)    Tana Conch, MD, MD  Electronically signed 10/31/2022 at 3:17 PM

## 2022-10-31 NOTE — Other (Signed)
Denies SI

## 2022-10-31 NOTE — Progress Notes (Signed)
Pre Procedure Patient Instructions     Procedure Location hospital:[facility]: [address], [place] - [place] in front of Hospital Main Entrance and check in at [employer].    Procedure Date 10/31/22  Arrival Time  1300    Your doctor determines your scheduled start time.  Depending on the facility where your procedure is taking place and the scheduled start time, you may be required to arrive up to 2 hours prior.  This is to allow time for registration, your preop assessment, any day of procedure testing and to meet with your care teams members.  This also allows your procedure to be performed earlier should there be a cancellation that day.  We appreciate your patience as we work to provide you with excellent service.    Medications:  Medication to be taken the morning of surgery with a few sips of water only: Percocet, OxyContin, Cefdinir      Stop all supplements, vitamins and herbal remedies one week prior to your procedure, unless your doctor told you to continue taking.  Do not take over the counter pain medications except plain Tylenol or Acetaminophen unless your doctor told you to do so.  If you are taking blood thinners, call the doctor performing your procedure and prescribing physician for instructions on when to stop.      Procedure Preparation    Diet Restrictions:No food or drink including gum or mints -after midnight      If you have questions on the day of your procedure call [facility] Preop at [phone].    Skin Preparation:  Wash with Hibiclens or an antibacterial soap (e.g. Dial soap) the night before and morning of procedure.    Do not put on any deodorants, lotions, powders, or oils on the day of procedure.  Be sure to put on clean, comfortable loose fitting clothing.    Other Preparation:  Call your doctor right away if you have any fever, cold or flu symptoms      No test required before surgery          Day of Procedure Patient Instruction:  Do not  smoke, vape, chew tobacco, drink alcohol or use recreational drugs on the day of your procedure  Remove all jewelry, piercings, and metal accessories  Do not wear artificial nails and only clear nail polish on natural nails. Nails must be trimmed to fingertip length to make sure the oxygen probe fits properly and to avoid injury  Do not wear artificial eye lashes because they can cause dry eyes, eye scratches, eye infections and allergic reactions  Do not use hair extensions with metal clips or hairstyles near the back of your neck, as it can make it difficult to safely manage your breathing during anesthesia  If your hair is tightly braided or styled in a complex way, it may need to be undone for your safety  Do not wear contacts, tampons, make-up, lotions, creams, powders, fragrances or deodorant  Wear loose fit clothing  Do not bring valuables or money  Bring a copy of your Living Will and/or Medical Durable Power of Attorney if you have one  Bring a list of current medications including name and dosage  Bring a picture ID and insurance card     Bring a storage case for eyeglasses, hearing aids, dentures, etc          If you are going home the same day as your procedure, a support person should  accompany you to the facility and must transport you home.  If you plan to take public transportation of any sort, your support person must accompany you home.  You will need someone to stay with you for 24 hours after your procedure with sedation of any kind.     Comments:     This information was reviewed with you during your Pre-Admission Testing interview and you verbalized understanding. If you have any additional questions please contact [phone].    To pre-register for your procedure please call [phone] Option 1.    For financial questions regarding your procedure at a [facility] facility, please contact [phone].    For financial questions regarding anesthesia at a [facility] facility,  please  contact [phone].    For MyChart Patient Portal help please call [phone].

## 2022-10-31 NOTE — Discharge Instructions (Signed)
You may remove dressing in 24-48 hrs.   Do not soak in pool or bath.   You may shower after dressing is removed, let water run on your back and not down your front.  You may use Ice packs 20 min at a time 3-4 times a day for discomfort

## 2022-11-06 ENCOUNTER — Ambulatory Visit: Admit: 2022-11-06 | Discharge: 2022-11-06 | Payer: MEDICARE | Primary: Internal Medicine

## 2022-11-06 ENCOUNTER — Ambulatory Visit: Payer: MEDICARE | Attending: Hematology & Oncology | Primary: Internal Medicine

## 2022-11-06 ENCOUNTER — Encounter

## 2022-11-06 ENCOUNTER — Ambulatory Visit
Admit: 2022-11-06 | Discharge: 2022-11-06 | Payer: MEDICARE | Attending: Hematology & Oncology | Primary: Internal Medicine

## 2022-11-06 ENCOUNTER — Ambulatory Visit: Payer: MEDICARE | Primary: Internal Medicine

## 2022-11-06 DIAGNOSIS — K3189 Other diseases of stomach and duodenum: Secondary | ICD-10-CM

## 2022-11-06 DIAGNOSIS — C169 Malignant neoplasm of stomach, unspecified: Secondary | ICD-10-CM

## 2022-11-06 DIAGNOSIS — D649 Anemia, unspecified: Secondary | ICD-10-CM

## 2022-11-06 LAB — CBC WITH AUTO DIFFERENTIAL
Basophils %: 1.4 % — ABNORMAL HIGH (ref 0.0–1.2)
Basophils Absolute: 0.2 10*3/uL — ABNORMAL HIGH (ref 0.0–0.1)
Eosinophils %: 0.5 % (ref 0.0–5.4)
Eosinophils Absolute: 0.1 10*3/uL (ref 0.0–0.6)
Hematocrit: 28.1 % — ABNORMAL LOW (ref 37.2–47.8)
Hemoglobin: 8.5 g/dL — ABNORMAL LOW (ref 12.5–16.1)
Lymphocytes Absolute: 2.1 10*3/uL (ref 0.5–5.0)
Lymphocytes: 17.2 % (ref 10.0–50.0)
MCH: 26 pg — ABNORMAL LOW (ref 27–33)
MCHC: 30.3 g/dL — ABNORMAL LOW (ref 32.5–34.8)
MCV: 84 fL (ref 81–97)
MPV: 5.86 fL — ABNORMAL LOW (ref 7.00–10.00)
Monocytes %: 6.3 % (ref 4.0–12.7)
Monocytes Absolute: 0.8 10*3/uL (ref 0.1–1.3)
Neutrophils %: 74.7 % (ref 40.1–76.4)
Neutrophils Absolute: 9.2 10*3/uL — ABNORMAL HIGH (ref 1.3–8.4)
Platelets: 552 10*3/uL — ABNORMAL HIGH (ref 142–424)
RBC: 3.34 M/uL — ABNORMAL LOW (ref 4.00–5.58)
RDW: 13.3 % (ref 11.6–14.8)
WBC: 12.3 10*3/uL — ABNORMAL HIGH (ref 3.2–12.0)

## 2022-11-06 LAB — COMPREHENSIVE METABOLIC PANEL
ALT: 58 U/L — ABNORMAL HIGH (ref 0–50)
AST: 88 U/L — ABNORMAL HIGH (ref 0–50)
Albumin: 3.2 g/dl — ABNORMAL LOW (ref 3.50–5.20)
Alk Phosphatase: 402 U/L — ABNORMAL HIGH (ref 40–130)
BUN: 17 mg/dl (ref 8.0–23.0)
CO2: 27.8 mmol/L (ref 22.0–29.0)
Calcium: 8.6 mg/dl — ABNORMAL LOW (ref 8.80–10.20)
Chloride: 100 mmol/L (ref 98.0–107.0)
Creatinine: 0.6 mg/dl — ABNORMAL LOW (ref 0.70–1.30)
Glucose: 115.9 mg/dl — ABNORMAL HIGH (ref 70.0–99.0)
Potassium: 4.54 mmol/L (ref 3.50–5.30)
Sodium: 133 mmol/L — ABNORMAL LOW (ref 135.0–145.0)
Total Bilirubin: 0.5 mg/dl (ref 0.00–1.20)
Total Protein: 6.3 g/dL — ABNORMAL LOW (ref 6.40–8.30)

## 2022-11-06 LAB — RESPIRATORY PANEL, MOLECULAR
Adenovirus: NOT DETECTED
Bordetella Parapertussis: NOT DETECTED
Bordetella Pertussis: NOT DETECTED
Chlamydia Pneumoniae: NOT DETECTED
Coronavirus 299E: NOT DETECTED
Coronavirus HKU1: NOT DETECTED
Coronavirus NL63: NOT DETECTED
Coronavirus OC43: NOT DETECTED
Human Metapneumovirus: NOT DETECTED
Human Rhinovirus/Enterovirus: NOT DETECTED
Influenza A: NOT DETECTED
Influenza B: NOT DETECTED
Mycoplasma pneumoniae: NOT DETECTED
Parainfluenza 1: NOT DETECTED
Parainfluenza 2: NOT DETECTED
Parainfluenza 3: NOT DETECTED
Parainfluenza: NOT DETECTED
Respiratory Syncytial Virus: NOT DETECTED
SARS-CoV-2: NOT DETECTED

## 2022-11-06 LAB — IRON AND TIBC
% SATURATION: 12 % — ABNORMAL LOW (ref 20.00–60.00)
Iron: 22 ug/dl — ABNORMAL LOW (ref 59–158)
TIBC: 180 ug/dl — ABNORMAL LOW (ref 250–450)

## 2022-11-06 LAB — FERRITIN: Ferritin: 710 ng/mL — ABNORMAL HIGH (ref 22–415)

## 2022-11-06 MED ORDER — ONDANSETRON 8 MG PO TBDP
8 | ORAL_TABLET | Freq: Three times a day (TID) | ORAL | 1 refills | Status: AC | PRN
Start: 2022-11-06 — End: ?

## 2022-11-06 MED ORDER — PROMETHAZINE HCL 25 MG PO TABS
25 | ORAL_TABLET | Freq: Four times a day (QID) | ORAL | 1 refills | Status: AC | PRN
Start: 2022-11-06 — End: ?

## 2022-11-06 MED ORDER — LEVOFLOXACIN 500 MG PO TABS
500 | ORAL_TABLET | Freq: Every day | ORAL | 0 refills | Status: AC
Start: 2022-11-06 — End: 2022-11-13

## 2022-11-06 NOTE — Progress Notes (Unsigned)
Patient: Jesse Savage  DOB: 1960-08-10 (62 y.o.)  Date: 11/06/2022  Attending Physician: Evalyn Casco., MD  Advanced Practice Provider: Yvonne Kendall, APRN - NP      PRINCIPAL DIAGNOSIS:  No diagnosis found.     CHIEF COMPLAINT:      HISTORY OF PRESENT ILLNESS:   Add iron studies  Taking miralax BID, now having normal bowel movements  Eating better  Throat sore   Still having fevers, night sweats, shallow breaths  RVP    ONCOLOGIC HISTORY:  Originally presenting to the emergency department with a 20 pound weight loss, abdominal discomfort, and low grade fevers  CT abdomen/pelvis 10/25/22: There is a gastric mass at the gastric fundus measuring approximately 4.2 x 4.0 cm in size with multiple abnormal hepatic masses, multiple abnormally enlarged   lymph nodes, and multiple abnormal pulmonary nodules likely representing   extensive metastatic disease.  CTA chest 10/25/22: No evidence of acute pulmonary embolism as clinically queried. No pneumonia. No pleural effusion. No pneumothorax. Multiple bilateral pulmonary nodules with multiple hepatic lesions most likely representing extensive metastatic disease.  5/22  PET/CT 10/31/22: Intensely hypermetabolic gastric cardia mass with associated regional   hypermetabolic lymphadenopathy and more distant, fairly extensive pulmonary and   hepatic metastases.  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, squamous cell  10/31/22 IR Port placement        PAST MEDICAL HISTORY:  Past Medical History:   Diagnosis Date    Back pain     Lumbar disc disease     OSA (obstructive sleep apnea)     Doesnt use machine    Sciatica        PAST SURGICAL HISTORY:  Past Surgical History:   Procedure Laterality Date    BACK SURGERY  06/2010    L5-S1 REMOVAL OF SCREWS    CERVICAL SPINE SURGERY  08/26/2019    C5-C6 ACDF    FACIAL SURGERY  1981    HERNIA  REPAIR  2002    IR PORT PLACEMENT EQUAL OR GREATER THAN 5 YEARS  10/31/2022    IR PORT PLACEMENT EQUAL OR GREATER THAN 5 YEARS 10/31/2022 Tana Conch, MD [facility]    LUMBAR FUSION  04/22/2011    L4-L5 PLIF USING DePUY SPACER, BMP, AUTOGRAFT, LATERAL FUSION USING SYNTHES SCREWS    THUMB ARTHROSCOPY  1995    TONSILLECTOMY AND ADENOIDECTOMY  1972       HOME MEDICATIONS:  Current Outpatient Medications   Medication Sig Dispense Refill    Polyethylene Glycol 3350 (MIRALAX PO) Take 17 g by mouth every morning      Naproxen Sodium (ALEVE) 220 MG CAPS Take 1 tablet by mouth every morning      sulfamethoxazole-trimethoprim (BACTRIM DS;SEPTRA DS) 800-160 MG per tablet  (Patient not taking: Reported on 10/30/2022)      cefdinir (OMNICEF) 300 MG capsule Take 1 capsule by mouth 2 times daily (Patient not taking: Reported on 10/31/2022)      acetaminophen (TYLENOL) 500 MG tablet Take 2 tablets by mouth every 6 hours as needed for Pain      OLANZapine (ZYPREXA) 5 MG tablet Take 1 tablet by mouth nightly (Patient not taking: Reported on 10/31/2022) 30 tablet 3    oxyCODONE (OXYCONTIN) 10 MG extended release tablet Take 1 tablet by mouth 2 times daily for 30 days. Intended supply: 30 days Max Daily Amount: 20 mg 60 tablet 0  oxyCODONE-acetaminophen (PERCOCET) 7.5-325 MG per tablet Take 1 tablet by mouth every 6 hours as needed.      cyclobenzaprine (FLEXERIL) 10 MG tablet Take 1 tablet by mouth 3 times daily as needed for Muscle spasms      sennosides-docusate sodium (SENOKOT-S) 8.6-50 MG tablet Take 1 tablet by mouth 2 times daily       No current facility-administered medications for this visit.        ALLERGIES:  Codeine, Dilaudid [hydromorphone hcl], Duloxetine hcl, Gabapentin, Hydrocodone, Pregabalin, and Tramadol hcl    SOCIAL HISTORY:  Social History     Socioeconomic History    Marital status: Married   Tobacco Use    Smoking status: Never    Smokeless tobacco: Never   Vaping Use    Vaping Use: Never used   Substance and  Sexual Activity    Alcohol use: Not Currently    Drug use: Never        FAMILY HISTORY:  Family History   Problem Relation Age of Onset    Heart Failure Father     Diabetes Father     Cancer Father         Bladder Cancer    Diabetes Paternal Grandmother     Heart Failure Paternal Grandfather         REVIEW OF SYSTEMS:   A comprehensive 12 system review of systems is negative with the exception of pertinent findings above.     VITAL SIGNS:    There were no vitals taken for this visit.    PHYSICAL EXAM:    Constitutional:  Normal appearance; no acute distress  Eyes:  Conjunctiva normal; eyelids normal; PERRLA; sclera anicteric  Ear, Nose, Throat:  External ears and nose normal; hearing grossly normal; oropharynx unremarkable  Neck:  Supple; no masses; no adenopathy  Respiratory:  Breathing comfortably; lungs clear to auscultation and percussion  Cardiovascular:  Normal heart sounds; regular rate and rhythm; no peripheral edema  Abdomen:  No masses; no tenderness; no hepatomegaly; no splenomegaly; bowel sounds normal  Lymph nodes:  No cervical adenopathy; no axillary adenopathy; no supraclavicular adenopathy; no inguinal adenopathy  Skin:  No rash; no petechiae; no ecchymoses; no pallor; no cutaneous nodules  Musculoskeletal:  Normal muscle strength  Neurologic:  No focal motor or sensory deficit; cranial nerves intact; normal gait; no abnormal mental status  Psychiatric:  Oriented to person time and place; mood and affect appropriate to situation; appropriate judgment and insight; memory intact    LABORATORY DATA:  No results found for this or any previous visit (from the past 24 hour(s)).        Impression & Plan:            Dr. Berneta Sages. was onsite and available during this office visit.    Yvonne Kendall, APRN - NP    Elements of this note have been dictated using speech recognition software. As a result, errors of speech recognition may have occurred.

## 2022-11-07 NOTE — Progress Notes (Signed)
Date:  11/06/2022    Patient Name:   Jesse Savage    Date of Birth:  [birth year]    Primary Diagnosis:   Diagnosis Orders   1. 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  Information on Oxaliplatin, 5-FU, and Leukovorin  from [email or web address] was provided and discussed in detail the patient and his wife.  He will receive Oxaliplatin 85 mg/m2, Leukovorin 400 mg/m2, and 5-FU 400 mg/m2 IV followed by 5-FU 2400 mg/m2 via continuous infusion pump for 46 hours, every 2 weeks.     Oxaliplatin was reviewed and side effects were discussed, including but not limited to peripheral neuropathy, nausea, vomiting, diarrhea, mouth sores, low blood counts, fatigue, loss of appetite, constipation, fever, cough, unusual bleeding such as black tarry stools or bloody stools,  or headache.  Discussed the importance of avoiding cold temperatures as well as cold beverages or foods or any cold contacts with the skin.   Reactions that might be encountered due to exposure to cold while on Oxaliplatin were discussed in great detail.    5-FU was also reviewed and discussed at length.  We discussed its continuous infusion for 46 hours via pump. Side effects were reviewed including but not limited to anemia, fatigue, diarrhea, hand-foot syndrome, nausea, vomiting, elevated liver enzymes, poor appetite, leukopenia, thrombocytopenia, shortness of breath, headache, dehydration, nail changes, skin changes, or fever.    We discussed leukovorin and its mechanism of action with 5-FU.  We discussed the possibility of allergic reaction and/or nausea and vomiting (rare).    See below for prescriptions provided at this visit.    The patient is instructed to call for fever greater than 100.5 F.  He express understanding.    We discussed small frequent meals and staying hydrated with 8-10 glasses of non-caffeinated beverages per  day.    All of his questions were answered and he  instructed to call if any further questions arise.  He expresses understanding of the above treatment plan.    Jesse Savage consents to treatment.    The patient was educated for greater than 30 minutes.  100% of the time was spent in counseling and coordination of care.    Prescription provided during today's visit:  Promethazine  Ondansetron  Levaquin    Yvonne Kendall, APRN - NP

## 2022-11-10 ENCOUNTER — Inpatient Hospital Stay: Admit: 2022-11-10 | Discharge: 2022-11-10 | Payer: MEDICARE | Primary: Internal Medicine

## 2022-11-10 DIAGNOSIS — C160 Malignant neoplasm of cardia: Secondary | ICD-10-CM

## 2022-11-10 DIAGNOSIS — Z5111 Encounter for antineoplastic chemotherapy: Secondary | ICD-10-CM

## 2022-11-10 MED ORDER — OXALIPLATIN 50 MG/10ML IV SOLN
50 | Freq: Once | INTRAVENOUS | Status: AC
Start: 2022-11-10 — End: 2022-11-10
  Administered 2022-11-10: 16:00:00 160 mg/m2 via INTRAVENOUS

## 2022-11-10 MED ORDER — SODIUM CHLORIDE 0.9 % IV SOLN
0.9 % | INTRAVENOUS | Status: AC
Start: 2022-11-10 — End: 2022-11-12
  Administered 2022-11-10: 19:00:00 4500 mg/m2 via INTRAVENOUS

## 2022-11-10 MED ORDER — NORMAL SALINE FLUSH 0.9 % IV SOLN
0.9 % | INTRAVENOUS | Status: AC | PRN
Start: 2022-11-10 — End: 2022-11-11
  Administered 2022-11-10: 16:00:00 10 mL via INTRAVENOUS
  Administered 2022-11-10: 19:00:00 20 mL via INTRAVENOUS

## 2022-11-10 MED ORDER — FLUOROURACIL 500 MG/10ML IV SOLN
500 | Freq: Once | INTRAVENOUS | Status: AC
Start: 2022-11-10 — End: 2022-11-10
  Administered 2022-11-10: 19:00:00 750 mg/m2 via INTRAVENOUS

## 2022-11-10 MED ORDER — DEXAMETHASONE 12 MG IN NS 50 ML IVPB
12 | Freq: Once | INTRAVENOUS | Status: AC
Start: 2022-11-10 — End: 2022-11-10
  Administered 2022-11-10: 16:00:00 12 mg via INTRAVENOUS

## 2022-11-10 MED ORDER — PALONOSETRON HCL 0.25 MG/5ML IV SOLN
0.25 | Freq: Once | INTRAVENOUS | Status: AC
Start: 2022-11-10 — End: 2022-11-10
  Administered 2022-11-10: 16:00:00 0.25 mg via INTRAVENOUS

## 2022-11-10 MED ORDER — DEXTROSE 5 % IV SOLN
5 % | Freq: Once | INTRAVENOUS | Status: AC
Start: 2022-11-10 — End: 2022-11-10
  Administered 2022-11-10: 16:00:00 750 mg/m2 via INTRAVENOUS

## 2022-11-10 MED FILL — FLUOROURACIL 5 GM/100ML IV SOLN: 5 GM/100ML | INTRAVENOUS | Qty: 15

## 2022-11-10 MED FILL — PALONOSETRON HCL 0.25 MG/5ML IV SOLN: 0.25 MG/5ML | INTRAVENOUS | Qty: 5

## 2022-11-10 MED FILL — FLUOROURACIL 5 GM/100ML IV SOLN: 5 GM/100ML | INTRAVENOUS | Qty: 90

## 2022-11-10 MED FILL — OXALIPLATIN 100 MG/20ML IV SOLN: 100 MG/20ML | INTRAVENOUS | Qty: 32

## 2022-11-10 MED FILL — DEXAMETHASONE 12 MG IN NS 50 ML IVPB: 12 mg/50 mL | INTRAVENOUS | Qty: 50

## 2022-11-10 MED FILL — FLUOROURACIL 500 MG/10ML IV SOLN: 500 MG/10ML | INTRAVENOUS | Qty: 90

## 2022-11-10 MED FILL — LEUCOVORIN CALCIUM 350 MG IJ SOLR: 350 MG | INTRAMUSCULAR | Qty: 17.5

## 2022-11-10 NOTE — Plan of Care (Signed)
Knowledge deficit care plan and new medication, treatment plan, and reportable signs and symptoms was Initiated with the patient and significant other and the outcome was reinforced and ongoing plan of care.    Patient is here for cycle 1, day 1 of FOLFOX treatment plan.

## 2022-11-11 NOTE — Telephone Encounter (Signed)
Patient called stating that so far he has been doing well on his chemotherapy. Patient's only complaint is he has recently started experiencing a dry mouth. Patient wanted to know if this was normal and if so, what can he do to alleviate it. Please advise.

## 2022-11-12 ENCOUNTER — Inpatient Hospital Stay: Admit: 2022-11-12 | Discharge: 2022-11-12 | Payer: Medicare Other | Primary: Internal Medicine

## 2022-11-12 DIAGNOSIS — C160 Malignant neoplasm of cardia: Principal | ICD-10-CM

## 2022-11-12 MED ORDER — NORMAL SALINE FLUSH 0.9 % IV SOLN
0.9 | INTRAVENOUS | Status: DC | PRN
Start: 2022-11-12 — End: 2022-11-13
  Administered 2022-11-12: 18:00:00 20 mL via INTRAVENOUS

## 2022-11-12 NOTE — Plan of Care (Signed)
Knowledge deficit care plan type treatment plan was reviewed with the patient and the outcome was education for treatment plan was reinforced. Patient with questions on why the chemotherapy needs to run continuously over 46 hours. States he could not sleep well with pump and port accessed, complaints of night sweats. Educated patient on treatment plan including risk of toxicities and efficacy of chemotherapy given as continuous dose. Patient also voiced concerns about avoidance of cold beverages, reporting that he cannot drink his ensure protein drinks at room temperature and is concerned about not getting the calories and protein he needs. Educated patient on symptoms of cold intolerance and when to call for help.

## 2022-11-13 NOTE — Telephone Encounter (Signed)
Called and informed the patient of Caroline's message. Patient verbalized understanding. No further action is needed at this time.

## 2022-11-14 ENCOUNTER — Encounter

## 2022-11-14 NOTE — Telephone Encounter (Signed)
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 PET/CT which confirmed metastatic disease involving the liver as well as the lungs. He is planned to proceed with FOLFOX for management of his disease.     RD reached out to offer nutrition support services. No response - RD left voice message with call back number. MyChart messaging not available. Will mail contact information with mini meals, high protein breakfast and lunch ideas, shakes and smoothies, and ONS education material.     Wt Readings from Last 10 Encounters:   11/10/22 73.2 kg (161 lb 4.8 oz)   11/06/22 72.6 kg (160 lb)   10/31/22 72.5 kg (159 lb 13.3 oz)   10/29/22 72.7 kg (160 lb 3.2 oz)   10/27/22 73.5 kg (162 lb)   06/12/21 75.4 kg (166 lb 3.2 oz)   05/01/21 75.8 kg (167 lb)   02/20/21 77.1 kg (170 lb)   02/12/21 76.9 kg (169 lb 9.6 oz)   Weight appears to be overall stable this year. No significant weight loss present.     Patient/family/caregiver may reach out in the interim. RD contact information has been provided.    Ammie Ferrier, RD, LD  Registered Dietitian  [facility]  [facility]  [address]  [place], [place] [address]  [phone] O  [phone] F

## 2022-11-17 ENCOUNTER — Ambulatory Visit
Admit: 2022-11-17 | Discharge: 2022-11-17 | Payer: MEDICARE | Attending: Hematology & Oncology | Primary: Internal Medicine

## 2022-11-17 ENCOUNTER — Ambulatory Visit: Admit: 2022-11-17 | Discharge: 2022-11-17 | Payer: MEDICARE | Primary: Internal Medicine

## 2022-11-17 DIAGNOSIS — C160 Malignant neoplasm of cardia: Secondary | ICD-10-CM

## 2022-11-17 LAB — CBC WITH AUTO DIFFERENTIAL
Basophils %: 0.2 % (ref 0.0–1.2)
Basophils Absolute: 0 10*3/uL (ref 0.0–0.1)
Eosinophils %: 1 % (ref 0.0–5.4)
Eosinophils Absolute: 0.1 10*3/uL (ref 0.0–0.6)
Hematocrit: 26.8 % — ABNORMAL LOW (ref 37.2–47.8)
Hemoglobin: 8.2 g/dL — ABNORMAL LOW (ref 12.5–16.1)
Lymphocytes Absolute: 1 10*3/uL (ref 0.5–5.0)
Lymphocytes: 10.6 % (ref 10.0–50.0)
MCH: 25 pg — ABNORMAL LOW (ref 27–33)
MCHC: 30.6 g/dL — ABNORMAL LOW (ref 32.5–34.8)
MCV: 83 fL (ref 81–97)
MPV: 9 fL (ref 7.00–10.00)
Monocytes %: 10.5 % (ref 4.0–12.7)
Monocytes Absolute: 1 10*3/uL (ref 0.1–1.3)
Neutrophils %: 76.9 % — ABNORMAL HIGH (ref 40.1–76.4)
Neutrophils Absolute: 7 10*3/uL (ref 1.3–8.4)
Platelets: 268 10*3/uL (ref 142–424)
RBC: 3.24 M/uL — ABNORMAL LOW (ref 4.00–5.58)
RDW: 14.4 % (ref 11.6–14.8)
WBC: 9.1 10*3/uL (ref 3.2–12.0)

## 2022-11-17 LAB — COMPREHENSIVE METABOLIC PANEL
ALT: 33 U/L (ref 0–50)
AST: 55 U/L — ABNORMAL HIGH (ref 0–50)
Albumin: 2.9 g/dl — ABNORMAL LOW (ref 3.50–5.20)
Alk Phosphatase: 385 U/L — ABNORMAL HIGH (ref 40–130)
BUN: 17 mg/dl (ref 8.0–23.0)
CO2: 24.7 mmol/L (ref 22.0–29.0)
Calcium: 7.9 mg/dl — ABNORMAL LOW (ref 8.80–10.20)
Chloride: 101 mmol/L (ref 98.0–107.0)
Creatinine: 0.5 mg/dl — ABNORMAL LOW (ref 0.70–1.30)
Glucose: 106.4 mg/dl — ABNORMAL HIGH (ref 70.0–99.0)
Potassium: 4.14 mmol/L (ref 3.50–5.30)
Sodium: 132 mmol/L — ABNORMAL LOW (ref 135.0–145.0)
Total Bilirubin: 0.7 mg/dl (ref 0.00–1.20)
Total Protein: 5.8 g/dL — ABNORMAL LOW (ref 6.40–8.30)

## 2022-11-17 LAB — FOLATE: Folate: 15.37 ng/mL (ref 4.80–24.20)

## 2022-11-17 LAB — TSH WITH REFLEX: TSH: 1.67 mcIU/mL (ref 0.358–3.740)

## 2022-11-17 LAB — VITAMIN B12: Vitamin B-12: 1144 pg/mL — ABNORMAL HIGH (ref 230–1050)

## 2022-11-17 MED ORDER — OLANZAPINE 5 MG PO TABS
5 MG | ORAL_TABLET | Freq: Every evening | ORAL | 3 refills | Status: AC
Start: 2022-11-17 — End: 2022-12-30

## 2022-11-17 MED ORDER — MAGIC MOUTHWASH
Freq: Four times a day (QID) | 4 refills | Status: AC | PRN
Start: 2022-11-17 — End: ?

## 2022-11-17 NOTE — Progress Notes (Signed)
Patient: Jesse Savage  DOB: 04-01-1961 (62 y.o.)  Date: 11/17/2022  Attending Physician: Evalyn Casco., MD  Advanced Practice Provider: Stefan Church Jissel Slavens, APRN - NP      PRINCIPAL DIAGNOSIS:    ICD-10-CM    1. 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 Anemia, unspecified type  D64.9 Vitamin B12     Methylmalonic Acid, Serum     Folate     TSH with Reflex      3. Anorexia  R63.0       4. CINV (chemotherapy-induced nausea and vomiting)  R11.2     T45.1X5A       5. Mucositis  K12.30       6. Other insomnia  G47.09              CHIEF COMPLAINT:  "The first cycle was rough"     HISTORY OF PRESENT ILLNESS:   Jesse Savage returns today for comprehensive reassessment regarding stage IV gastric SCC. On 11/10/22, he started cycle 1 day 1 of FOLFOX. He presents today for a toxicity check.  He states around day 4, he developed severe fatigue and malaise.  Yesterday, he experienced nausea but did not take any antiemetics.  He has also had poor p.o. intake over the last 5 or so days.  In the last week, he is lost approximately 8 pounds.  He states that he is drinking approximately 2-3 protein shakes a day.  He denies any emesis.  He endorses oral mucositis.  He notes by Friday he was able to have cool drinks and by Saturday he was able to eat ice.  He notices a more noticeable fine tremor of the left hand.  He continues compliance with OxyContin 10 mg twice daily as well as Percocet with breakthrough pain.  He notes adequate pain coverage.  He continues to take Aleve for night sweats which she states have improved and are not as severe.  However, they are still daily. He also continues to endorse insomnia.  He states it is exacerbated on the days that he had the 5-FU pump as he was fearful of the pump would fall off the bed.      ONCOLOGIC HISTORY:  Metastatic gastric cancer, squamous cell differentiation  Originally  presenting to the emergency department with a 20 pound weight loss, abdominal discomfort, and low grade fevers  CT abdomen/pelvis 10/25/22: There is a gastric mass at the gastric fundus measuring approximately 4.2 x 4.0 cm in size with multiple abnormal hepatic masses, multiple abnormally enlarged   lymph nodes, and multiple abnormal pulmonary nodules likely representing   extensive metastatic disease.  CTA chest 10/25/22: No evidence of acute pulmonary embolism as clinically queried. No pneumonia. No pleural effusion. No pneumothorax. Multiple bilateral pulmonary nodules with multiple hepatic lesions most likely representing extensive metastatic disease.  5/22  PET/CT 10/31/22: Intensely hypermetabolic gastric cardia mass with associated regional   hypermetabolic lymphadenopathy and more distant, fairly extensive pulmonary and   hepatic metastases.  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, squamous cell  10/31/22 IR Port placement        PAST MEDICAL HISTORY:  Past Medical History:   Diagnosis Date    Back pain     Lumbar disc disease     OSA (obstructive sleep apnea)     Doesnt use machine    Sciatica  PAST SURGICAL HISTORY:  Past Surgical History:   Procedure Laterality Date    BACK SURGERY  06/2010    L5-S1 REMOVAL OF SCREWS    CERVICAL SPINE SURGERY  08/26/2019    C5-C6 ACDF    FACIAL SURGERY  1981    HERNIA REPAIR  2002    IR PORT PLACEMENT EQUAL OR GREATER THAN 5 YEARS  10/31/2022    IR PORT PLACEMENT EQUAL OR GREATER THAN 5 YEARS 10/31/2022 Tana Conch, MD [facility] IR    LUMBAR FUSION  04/22/2011    L4-L5 PLIF USING DePUY SPACER, BMP, AUTOGRAFT, LATERAL FUSION USING SYNTHES SCREWS    THUMB ARTHROSCOPY  1995    TONSILLECTOMY AND ADENOIDECTOMY  1972       HOME MEDICATIONS:  Current Outpatient Medications   Medication Sig Dispense Refill    OMEPRAZOLE PO Take 40 mg by mouth      Simethicone (GAS-X PO) Take by mouth 2 times  daily      BISACODYL PO Take by mouth      DOCUSATE SODIUM PO Take by mouth      ondansetron (ZOFRAN-ODT) 8 MG TBDP disintegrating tablet Take 1 tablet by mouth every 8 hours as needed for Nausea or Vomiting (nausea, vomiting) 30 tablet 1    promethazine (PHENERGAN) 25 MG tablet Take 1 tablet by mouth every 6 hours as needed for Nausea 30 tablet 1    Polyethylene Glycol 3350 (MIRALAX PO) Take 17 g by mouth 2 times daily      Naproxen Sodium (ALEVE) 220 MG CAPS Take 1 tablet by mouth 2 times daily      oxyCODONE (OXYCONTIN) 10 MG extended release tablet Take 1 tablet by mouth 2 times daily for 30 days. Intended supply: 30 days Max Daily Amount: 20 mg 60 tablet 0    oxyCODONE-acetaminophen (PERCOCET) 7.5-325 MG per tablet Take 1 tablet by mouth every 6 hours as needed.       No current facility-administered medications for this visit.        ALLERGIES:  Codeine, Dilaudid [hydromorphone hcl], Duloxetine hcl, Gabapentin, Hydrocodone, Pregabalin, and Tramadol hcl    SOCIAL HISTORY:  Social History     Socioeconomic History    Marital status: Married   Tobacco Use    Smoking status: Never    Smokeless tobacco: Never   Vaping Use    Vaping Use: Never used   Substance and Sexual Activity    Alcohol use: Not Currently    Drug use: Never        FAMILY HISTORY:  Family History   Problem Relation Age of Onset    Heart Failure Father     Diabetes Father     Cancer Father         Bladder Cancer    Diabetes Paternal Grandmother     Heart Failure Paternal Grandfather         REVIEW OF SYSTEMS:   A comprehensive 12 system review of systems is negative with the exception of pertinent findings above.     VITAL SIGNS:    BP 103/70 (Site: Left Upper Arm, Position: Sitting, Cuff Size: Medium Adult)   Pulse (!) 104   Temp 98.1 F (36.7 C) (Oral)   Ht 1.753 m (5\' 9" )   Wt 69.4 kg (153 lb)   SpO2 95%   BMI 22.59 kg/m     PHYSICAL EXAM:    Constitutional: NAD. Appears older than stated age; temporal wasting present  Eyes:   Conjunctiva  normal; eyelids normal; PERRLA; sclera anicteric  Ear, Nose, Throat:  External ears and nose normal; hearing grossly normal; bilateral buccal mucositis present; poor dentition  Neck:  Supple; no masses; no adenopathy  Respiratory: Lungs clear to auscultation bilaterally, symmetric chest rise, nonlabored respirations  Cardiovascular:  Normal heart sounds; regular rate and rhythm; no peripheral edema  Abdomen: Abdomen mildly distended, hypoactive bowel sounds, tender to palpation of right upper and left upper quadrants  Lymph nodes:  No cervical adenopathy; no axillary adenopathy; no supraclavicular adenopathy  Skin:  No rash; no petechiae; no ecchymoses; no pallor; no cutaneous nodules  Musculoskeletal:  Normal muscle strength  Neurologic:  No focal motor or sensory deficit; cranial nerves intact; normal gait; no abnormal mental status  Psychiatric:  Oriented to person time and place; mood and affect appropriate to situation; appropriate judgment and insight; memory intact  Access: Right upper chest port    LABORATORY DATA:  Recent Results (from the past 24 hour(s))   Comprehensive Metabolic Panel    Collection Time: 11/17/22  2:18 PM   Result Value Ref Range    Sodium 132.0 (L) 135.0 - 145.0 mmol/L    Potassium 4.14 3.50 - 5.30 mmol/L    Chloride 101.0 98.0 - 107.0 mmol/L    CO2 24.7 22.0 - 29.0 mmol/L    Calcium 7.90 (L) 8.80 - 10.20 mg/dl    Glucose 098.1 (H) 19.1 - 99.0 mg/dl    BUN 47.8 8.0 - 29.5 mg/dl    Creatinine 6.21 (L) 0.70 - 1.30 mg/dl    ALT 33 0 - 50 U/L    AST 55 (H) 0 - 50 U/L    Alk Phosphatase 385 (H) 40 - 130 U/L    Total Bilirubin 0.70 0.00 - 1.20 mg/dl    Total Protein 3.08 (L) 6.40 - 8.30 g/dL    Albumin 6.57 (L) 8.46 - 5.20 g/dl   CBC With Auto Differential    Collection Time: 11/17/22  2:18 PM   Result Value Ref Range    WBC 9.1 3.2 - 12.0 K/uL    Neutrophils Absolute 7.0 1.3 - 8.4 K/uL    Neutrophils % 76.9 (H) 40.1 - 76.4 %    Lymphocytes Absolute 1.0 0.5 - 5.0 K/uL     Lymphocytes 10.6 10.0 - 50.0 %    Monocytes Absolute 1.0 0.1 - 1.3 K/uL    Monocytes % 10.5 4.0 - 12.7 %    Eosinophils Absolute 0.1 0.0 - 0.6 K/uL    Eosinophils % 1.0 0.0 - 5.4 %    Basophils Absolute 0.0 0.0 - 0.1 k/uL    Basophils % 0.2 0.0 - 1.2 %    RBC 3.24 (L) 4.00 - 5.58 M/uL    Hemoglobin 8.2 (L) 12.5 - 16.1 g/dL    Hematocrit 96.2 (L) 37.2 - 47.8 %    MCV 83 81 - 97 fL    MCH 25 (L) 27 - 33 pg    MCHC 30.6 (L) 32.5 - 34.8 g/dL    RDW 95.2 84.1 - 32.4 %    Platelets 268 142 - 424 K/uL    MPV 9.00 7.00 - 10.00 fL             Impression & Plan:      Stage IV gastric cancer, squamous cell: Status post cycle 1 day 1 of FOLFOX.  We will tentatively proceed with cycle 2 next Monday.  I have again reached out to [facility] GI pathology to further inquire about PD-L1  testing as well as HER2 testing.      Nausea: I have educated the patient to take Zofran every 8 hours as needed for nausea.  He is very hesitant to take pills.  We have agreed that he would take 1 Zofran in the morning.    Constipation: The patient will continue with bisacodyl and increase MiraLAX.     Normocytic anemia: Iron studies demonstrate a pattern of chronic disease.  I have added on a B12, folate, MMA and TSH for further evaluation.    Fever of unknown origin with drenching night sweats: Likely tumor fever.  Symptoms continue to improve with the initiation of chemotherapy as well as the addition of Aleve.      Chemotherapy-induced mucositis: I have sent a prescription for Magic mouthwash    Anorexia: 8 pound weight loss in the last week. I have sent a prescription for Zyprexa 5 mg nightly I have also encouraged the patient to drink 4-6 Ensure/boost a day.    Pain of metastatic malignancy: Pain improved with the addition of OxyContin 10 mg twice daily.  He will also continue Percocet for breakthrough pain.    Follow-up: 6/17 labs, provider visit, infusion FOLFOX    Dr. Berneta Sages. was onsite and available during this office visit.    Stefan Church Kyaira Trantham, APRN - NP    Elements of this note have been dictated using speech recognition software. As a result, errors of speech recognition may have occurred.

## 2022-11-19 ENCOUNTER — Encounter

## 2022-11-23 LAB — METHYLMALONIC ACID, SERUM: Methylmalonic Acid: 215 nmol/L (ref 0–378)

## 2022-11-24 ENCOUNTER — Ambulatory Visit: Admit: 2022-11-24 | Discharge: 2022-11-24 | Payer: MEDICARE | Primary: Internal Medicine

## 2022-11-24 ENCOUNTER — Inpatient Hospital Stay: Admit: 2022-11-24 | Discharge: 2022-11-24 | Payer: MEDICARE | Primary: Internal Medicine

## 2022-11-24 ENCOUNTER — Ambulatory Visit
Admit: 2022-11-24 | Discharge: 2022-11-24 | Payer: MEDICARE | Attending: Hematology & Oncology | Primary: Internal Medicine

## 2022-11-24 ENCOUNTER — Encounter

## 2022-11-24 VITALS — BP 106/65 | HR 109 | Temp 98.80000°F | Wt 156.2 lb

## 2022-11-24 DIAGNOSIS — C160 Malignant neoplasm of cardia: Principal | ICD-10-CM

## 2022-11-24 DIAGNOSIS — Z5111 Encounter for antineoplastic chemotherapy: Secondary | ICD-10-CM

## 2022-11-24 LAB — COMPREHENSIVE METABOLIC PANEL
ALT: 34 U/L (ref 0–50)
AST: 82 U/L — ABNORMAL HIGH (ref 0–50)
Albumin: 3.1 g/dl — ABNORMAL LOW (ref 3.50–5.20)
Alk Phosphatase: 381 U/L — ABNORMAL HIGH (ref 40–130)
BUN: 16 mg/dl (ref 8.0–23.0)
CO2: 25.6 mmol/L (ref 22.0–29.0)
Calcium: 8.8 mg/dl (ref 8.80–10.20)
Chloride: 100 mmol/L (ref 98.0–107.0)
Creatinine: 0.6 mg/dl — ABNORMAL LOW (ref 0.70–1.30)
Glucose: 131.8 mg/dl — ABNORMAL HIGH (ref 70.0–99.0)
Potassium: 4.35 mmol/L (ref 3.50–5.30)
Sodium: 133 mmol/L — ABNORMAL LOW (ref 135.0–145.0)
Total Bilirubin: 0.5 mg/dl (ref 0.00–1.20)
Total Protein: 6 g/dL — ABNORMAL LOW (ref 6.40–8.30)

## 2022-11-24 LAB — CBC WITH AUTO DIFFERENTIAL
Basophils %: 1.1 % (ref 0.0–1.2)
Basophils Absolute: 0.1 10*3/uL (ref 0.0–0.1)
Eosinophils %: 2.7 % (ref 0.0–5.4)
Eosinophils Absolute: 0.2 10*3/uL (ref 0.0–0.6)
Hematocrit: 27.4 % — ABNORMAL LOW (ref 37.2–47.8)
Hemoglobin: 8 g/dL — ABNORMAL LOW (ref 12.5–16.1)
Lymphocytes Absolute: 0.9 10*3/uL (ref 0.5–5.0)
Lymphocytes: 14.3 % (ref 10.0–50.0)
MCH: 24 pg — ABNORMAL LOW (ref 27–33)
MCHC: 29.2 g/dL — ABNORMAL LOW (ref 32.5–34.8)
MCV: 83 fL (ref 81–97)
MPV: 9.5 fL (ref 7.00–10.00)
Monocytes %: 49 % — ABNORMAL HIGH (ref 4.0–12.7)
Monocytes Absolute: 3.1 10*3/uL — ABNORMAL HIGH (ref 0.1–1.3)
Neutrophils %: 31.5 % — ABNORMAL LOW (ref 40.1–76.4)
Neutrophils Absolute: 2 10*3/uL (ref 1.3–8.4)
Platelets: 740 10*3/uL (ref 142–424)
RBC: 3.29 M/uL — ABNORMAL LOW (ref 4.00–5.58)
RDW: 15.6 % — ABNORMAL HIGH (ref 11.6–14.8)
WBC: 6.4 10*3/uL (ref 3.2–12.0)

## 2022-11-24 MED ORDER — OXYCODONE HCL ER 10 MG PO T12A
10 MG | ORAL_TABLET | Freq: Two times a day (BID) | ORAL | 0 refills | Status: DC
Start: 2022-11-24 — End: 2022-12-30

## 2022-11-24 MED ORDER — DEXTROSE 5 % IV SOLN
5 | Freq: Once | INTRAVENOUS | Status: AC
Start: 2022-11-24 — End: 2022-11-24
  Administered 2022-11-24: 17:00:00 160 mg/m2 via INTRAVENOUS

## 2022-11-24 MED ORDER — NIVOLUMAB 10 MG/ML IV (MIXTURES ONLY)
100 | Freq: Once | INTRAVENOUS | Status: AC
Start: 2022-11-24 — End: 2022-11-24
  Administered 2022-11-24: 16:00:00 240 mg via INTRAVENOUS

## 2022-11-24 MED ORDER — FLUOROURACIL 500 MG/10ML IV SOLN
500 | Freq: Once | INTRAVENOUS | Status: AC
Start: 2022-11-24 — End: 2022-11-24
  Administered 2022-11-24: 19:00:00 750 mg/m2 via INTRAVENOUS

## 2022-11-24 MED ORDER — PALONOSETRON HCL 0.25 MG/5ML IV SOLN
0.25 | Freq: Once | INTRAVENOUS | Status: AC
Start: 2022-11-24 — End: 2022-11-24
  Administered 2022-11-24: 16:00:00 0.25 mg via INTRAVENOUS

## 2022-11-24 MED ORDER — FLUOROURACIL 500 MG/10ML IV SOLN
500 | INTRAVENOUS | Status: DC
Start: 2022-11-24 — End: 2022-11-25
  Administered 2022-11-24: 19:00:00 4500 mg/m2 via INTRAVENOUS

## 2022-11-24 MED ORDER — NORMAL SALINE FLUSH 0.9 % IV SOLN
0.9 | INTRAVENOUS | Status: DC | PRN
Start: 2022-11-24 — End: 2022-11-25
  Administered 2022-11-24: 19:00:00 30 mL via INTRAVENOUS

## 2022-11-24 MED ORDER — DEXAMETHASONE 12 MG IN NS 50 ML IVPB
12 | Freq: Once | INTRAVENOUS | Status: AC
Start: 2022-11-24 — End: 2022-11-24
  Administered 2022-11-24: 16:00:00 12 mg via INTRAVENOUS

## 2022-11-24 MED ORDER — DEXTROSE 5 % IV SOLN
5 | Freq: Once | INTRAVENOUS | Status: AC
Start: 2022-11-24 — End: 2022-11-24
  Administered 2022-11-24: 17:00:00 750 mg/m2 via INTRAVENOUS

## 2022-11-24 MED FILL — FLUOROURACIL 5 GM/100ML IV SOLN: 5 GM/100ML | INTRAVENOUS | Qty: 15

## 2022-11-24 MED FILL — LEUCOVORIN CALCIUM 200 MG IJ SOLR: 200 MG | INTRAMUSCULAR | Qty: 20

## 2022-11-24 MED FILL — FLUOROURACIL 5 GM/100ML IV SOLN: 5 GM/100ML | INTRAVENOUS | Qty: 90

## 2022-11-24 MED FILL — PALONOSETRON HCL 0.25 MG/5ML IV SOLN: 0.25 MG/5ML | INTRAVENOUS | Qty: 5

## 2022-11-24 MED FILL — DEXAMETHASONE 12 MG IN NS 50 ML IVPB: 12 mg/50 mL | INTRAVENOUS | Qty: 50

## 2022-11-24 MED FILL — FLUOROURACIL 500 MG/10ML IV SOLN: 500 MG/10ML | INTRAVENOUS | Qty: 90

## 2022-11-24 MED FILL — OXALIPLATIN 100 MG/20ML IV SOLN: 100 MG/20ML | INTRAVENOUS | Qty: 32

## 2022-11-24 MED FILL — OPDIVO 240 MG/24ML IV SOLN: 240 MG/24ML | INTRAVENOUS | Qty: 24

## 2022-11-24 MED FILL — MONOJECT FLUSH SYRINGE 0.9 % IV SOLN: 0.9 % | INTRAVENOUS | Qty: 30

## 2022-11-24 NOTE — Progress Notes (Signed)
Date:  11/24/2022    Patient Name:   Jesse Savage  Date of Birth:  1960-09-30     Primary Diagnosis:    ICD-10-CM    1. 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 Pain of metastatic malignancy  G89.3       3. Abnormal laboratory test result  R89.9 MISCELLANEOUS SENDOUT     Flow Cytometry      4. Abnormal histological findings in specimens from other organs, systems and tissues  R89.7 Flow Cytometry           Treatment Regimen:  Nivolumab    Education:    The patient presents for education on the nivolumab regimen.  Information on nivolumab from [email or web address] was provided and discussed in detail.  See above for treatment details.    Mechanism of action of nivolumab was explained, and side effects reviewed, including but not limited to fatigue, elevated liver functions, nausea, hyponatremia, rash, hyperkalemia, itching, cough, swelling, upper respiratory infection, as well as the following  immune mediated conditions:  pneumonitis, colitis, hepatitis, nephritis, and hypo- and hyperthyroidism.  We discussed symptoms of those immune mediated conditions and when to notify our office.    Written information on how to manage nausea, vomiting, diarrhea, mouth sores, and hand-foot syndrome was provided and reviewed in detail. Previously, Jesse Savage was prescribed generic Zofran 8 mg by mouth every 6-8 hours as needed and generic Phenergan 12.5 mg by mouth every 8 hours as needed for nausea.  Risks and side effects of these two medications were reviewed respectively.    All of Jesse Savage questions were answered and Jesse Savage instructed to call if any further questions arise.  Jesse Savage expresses understanding of the above treatment plan.    Jesse Savage consents to treatment.    Kerry Kass, APRN - NP

## 2022-11-24 NOTE — Plan of Care (Signed)
Knowledge deficit care plan type diagnosis/disease process, treatment plan, self-care needs, reportable signs and symptoms, and psychosocial support resources and options was reviewed with the patient and family member and the outcome was ongoing plan of care. Pt verbalized understanding of opdivo and folfox tx.

## 2022-11-24 NOTE — Progress Notes (Signed)
Patient: Jesse Savage  DOB: 26-Oct-1960 (62 y.o.)  Date: 11/24/2022  Attending Physician: Evalyn Casco., MD  Advanced Practice Provider: Stefan Church Nefertari Rebman, APRN - NP      PRINCIPAL DIAGNOSIS:    ICD-10-CM    1. 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 Pain of metastatic malignancy  G89.3              CHIEF COMPLAINT:  "My energy bounced back a little bit and then I was tired again"    HISTORY OF PRESENT ILLNESS:   Jesse Savage returns today for comprehensive reassessment regarding stage IV gastric SCC.  Today, he presents prior to cycle 2 of FOLFOX.  Since cycle 1, we have had further pathology results returned which show that he is PD-L1 positive.  Since his last visit, he states that he had several good days but also several bad days.  He has been taking Zyprexa every evening.  He has had a 3 pound weight gain.  He has been taking Zofran in the morning.  Although the patient states that he does not want to take a lot of medication, he is agreeable to take Zofran around-the-clock several days after chemo.  He has been using MiraLAX approximately 1-3 times a day.  He has been having bowel movements approximately every other day with incomplete emptying.  He notes mild abdominal distention in the setting of incomplete evacuation.  He states that he tripped and twisted his ankle.  He also continues to endorse tremors of his left hand.  Pain is well-controlled with OxyContin 10 mg twice daily as well as Percocet for breakthrough pain.  Otherwise, comprehensive review of systems is unremarkable.        ONCOLOGIC HISTORY:  Metastatic gastric cancer, squamous cell differentiation-PD-L1 positive, HER2 positive  Originally presenting to the emergency department with a 20 pound weight loss, abdominal discomfort, and low grade fevers  CT abdomen/pelvis 10/25/22: There is a gastric mass at the gastric fundus measuring approximately  4.2 x 4.0 cm in size with multiple abnormal hepatic masses, multiple abnormally enlarged   lymph nodes, and multiple abnormal pulmonary nodules likely representing   extensive metastatic disease.  CTA chest 10/25/22: No evidence of acute pulmonary embolism as clinically queried. No pneumonia. No pleural effusion. No pneumothorax. Multiple bilateral pulmonary nodules with multiple hepatic lesions most likely representing extensive metastatic disease.  5/22  PET/CT 10/31/22: Intensely hypermetabolic gastric cardia mass with associated regional   hypermetabolic lymphadenopathy and more distant, fairly extensive pulmonary and   hepatic metastases.  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, squamous cell  10/31/22 IR Port placement  11/10/22 cycle 1 day 1 FOLFOX  11/24/22 added Nivolumab         PAST MEDICAL HISTORY:  Past Medical History:   Diagnosis Date    Back pain     Lumbar disc disease     OSA (obstructive sleep apnea)     Doesnt use machine    Sciatica        PAST SURGICAL HISTORY:  Past Surgical History:   Procedure Laterality Date    BACK SURGERY  06/2010    L5-S1 REMOVAL OF SCREWS    CERVICAL SPINE SURGERY  08/26/2019    C5-C6 ACDF    FACIAL SURGERY  1981    HERNIA REPAIR  2002    IR PORT PLACEMENT EQUAL OR  GREATER THAN 5 YEARS  10/31/2022    IR PORT PLACEMENT EQUAL OR GREATER THAN 5 YEARS 10/31/2022 Tana Conch, MD [facility]    LUMBAR FUSION  04/22/2011    L4-L5 PLIF USING DePUY SPACER, BMP, AUTOGRAFT, LATERAL FUSION USING SYNTHES SCREWS    THUMB ARTHROSCOPY  1995    TONSILLECTOMY AND ADENOIDECTOMY  1972       HOME MEDICATIONS:  Current Outpatient Medications   Medication Sig Dispense Refill    Magic Mouthwash (MIRACLE MOUTHWASH) Swish and spit 5 mLs 4 times daily as needed for Irritation Diphenhyddramine: nystatin: lidocaine: maalox 1200 mL 4    OLANZapine (ZYPREXA) 5 MG tablet Take 1 tablet by mouth nightly 30 tablet 3     OMEPRAZOLE PO Take 40 mg by mouth      Simethicone (GAS-X PO) Take by mouth 2 times daily      BISACODYL PO Take by mouth      DOCUSATE SODIUM PO Take by mouth      ondansetron (ZOFRAN-ODT) 8 MG TBDP disintegrating tablet Take 1 tablet by mouth every 8 hours as needed for Nausea or Vomiting (nausea, vomiting) 30 tablet 1    promethazine (PHENERGAN) 25 MG tablet Take 1 tablet by mouth every 6 hours as needed for Nausea 30 tablet 1    Polyethylene Glycol 3350 (MIRALAX PO) Take 17 g by mouth 2 times daily      Naproxen Sodium (ALEVE) 220 MG CAPS Take 1 tablet by mouth 2 times daily      oxyCODONE (OXYCONTIN) 10 MG extended release tablet Take 1 tablet by mouth 2 times daily for 30 days. Intended supply: 30 days Max Daily Amount: 20 mg 60 tablet 0    oxyCODONE-acetaminophen (PERCOCET) 7.5-325 MG per tablet Take 1 tablet by mouth every 6 hours as needed.       No current facility-administered medications for this visit.        ALLERGIES:  Codeine, Dilaudid [hydromorphone hcl], Duloxetine hcl, Gabapentin, Hydrocodone, Pregabalin, and Tramadol hcl    SOCIAL HISTORY:  Social History     Socioeconomic History    Marital status: Married   Tobacco Use    Smoking status: Never    Smokeless tobacco: Never   Vaping Use    Vaping Use: Never used   Substance and Sexual Activity    Alcohol use: Not Currently    Drug use: Never        FAMILY HISTORY:  Family History   Problem Relation Age of Onset    Heart Failure Father     Diabetes Father     Cancer Father         Bladder Cancer    Diabetes Paternal Grandmother     Heart Failure Paternal Grandfather         REVIEW OF SYSTEMS:   A comprehensive 12 system review of systems is negative with the exception of pertinent findings above.     VITAL SIGNS:    BP 106/65 (Site: Left Upper Arm, Position: Sitting, Cuff Size: Medium Adult)   Pulse (!) 109   Temp 98.8 F (37.1 C)   Wt 70.9 kg (156 lb 3.2 oz)   SpO2 95%   BMI 23.07 kg/m     PHYSICAL EXAM:    Constitutional: NAD. Appears  older than stated age; temporal wasting present  Eyes:  Conjunctiva normal; eyelids normal; PERRLA; sclera anicteric  Ear, Nose, Throat:  External ears and nose normal; hearing grossly normal; bilateral buccal mucositis present; poor  dentition  Neck:  Supple; no masses; no adenopathy  Respiratory: Lungs clear to auscultation bilaterally, symmetric chest rise, nonlabored respirations  Cardiovascular:  Normal heart sounds; regular rate and rhythm; no peripheral edema  Abdomen: Abdomen mildly distended, hypoactive bowel sounds, tender to palpation of right upper and left upper quadrants  Lymph nodes:  No cervical adenopathy; no axillary adenopathy; no supraclavicular adenopathy  Skin:  No rash; no petechiae; no ecchymoses; no pallor; no cutaneous nodules; SOA left ankle  Musculoskeletal:  Normal muscle strength  Neurologic:  No focal motor or sensory deficit; cranial nerves intact; normal gait; no abnormal mental status  Psychiatric:  Oriented to person time and place; mood and affect appropriate to situation; appropriate judgment and insight; memory intact  Access: Right upper chest port    LABORATORY DATA:  Recent Results (from the past 24 hour(s))   CBC With Auto Differential    Collection Time: 11/24/22 10:27 AM   Result Value Ref Range    WBC 6.4 3.2 - 12.0 K/uL    Neutrophils Absolute 2.0 1.3 - 8.4 K/uL    Neutrophils % 31.5 (L) 40.1 - 76.4 %    Lymphocytes Absolute 0.9 0.5 - 5.0 K/uL    Lymphocytes 14.3 10.0 - 50.0 %    Monocytes Absolute 3.1 (H) 0.1 - 1.3 K/uL    Monocytes % 49.0 (H) 4.0 - 12.7 %    Eosinophils Absolute 0.2 0.0 - 0.6 K/uL    Eosinophils % 2.7 0.0 - 5.4 %    Basophils Absolute 0.1 0.0 - 0.1 k/uL    Basophils % 1.1 0.0 - 1.2 %    RBC 3.29 (L) 4.00 - 5.58 M/uL    Hemoglobin 8.0 (L) 12.5 - 16.1 g/dL    Hematocrit 96.0 (L) 37.2 - 47.8 %    MCV 83 81 - 97 fL    MCH 24 (L) 27 - 33 pg    MCHC 29.2 (L) 32.5 - 34.8 g/dL    RDW 45.4 (H) 09.8 - 14.8 %    Platelets 740 (HH) 142 - 424 K/uL    MPV 9.50 7.00 -  10.00 fL   Comprehensive metabolic panel    Collection Time: 11/24/22 10:27 AM   Result Value Ref Range    Sodium 133.0 (L) 135.0 - 145.0 mmol/L    Potassium 4.35 3.50 - 5.30 mmol/L    Chloride 100.0 98.0 - 107.0 mmol/L    CO2 25.6 22.0 - 29.0 mmol/L    Calcium 8.80 8.80 - 10.20 mg/dl    Glucose 119.1 (H) 47.8 - 99.0 mg/dl    BUN 29.5 8.0 - 62.1 mg/dl    Creatinine 3.08 (L) 0.70 - 1.30 mg/dl    ALT 34 0 - 50 U/L    AST 82 (H) 0 - 50 U/L    Alk Phosphatase 381 (H) 40 - 130 U/L    Total Bilirubin 0.50 0.00 - 1.20 mg/dl    Total Protein 6.57 (L) 6.40 - 8.30 g/dL    Albumin 8.46 (L) 9.62 - 5.20 g/dl               Impression & Plan:      Stage IV gastric cancer, squamous cell: Proceed with cycle 2-day 1 of FOLFOX + nivolumab.  Today, we underwent nivolumab education.  Please see chemo education note which is filed separately.  After cycle 4, we will proceed with imaging.      Nausea: For the first several days after chemotherapy, he will take Zofran  every 8 hours around-the-clock.  He will continue Zyprexa nightly.     Constipation: Patient will increase MiraLAX dosing to 3-4 times a day    Normocytic anemia: Previous workup including iron studies, B12, MMA, folate and TSH have been unremarkable.  Most likely anemia of chronic disease.  We will continue to monitor.      Fever of unknown origin with drenching night sweats: Likely tumor fever.  Continue with Aleve.     Chemotherapy-induced mucositis: Continue Magic mouthwash    Anorexia: Continue Zyprexa 5 mg nightly.  He will continue 4-6 boost/Ensure a day.  .    Pain of metastatic malignancy: Pain improved with the addition of OxyContin 10 mg twice daily.  He will also continue Percocet for breakthrough pain.  A refill has been sent by Dr. Durwin Reges for OxyContin 10 mg twice daily.    Follow-up: 2 weeks     Dr. Berneta Sages. was onsite and available during this office visit.    Stefan Church Alliyah Roesler, APRN - NP    Elements of this note have been dictated using speech  recognition software. As a result, errors of speech recognition may have occurred.

## 2022-11-25 ENCOUNTER — Other Ambulatory Visit: Payer: Self-pay | Admitting: Cardiovascular Disease

## 2022-11-25 NOTE — Telephone Encounter (Signed)
Last visit 10/30/22 Next visit--none  Will send 30 day supply requesting pt to call for appt 1st attempt.

## 2022-11-26 ENCOUNTER — Inpatient Hospital Stay: Admit: 2022-11-26 | Discharge: 2022-11-26 | Payer: MEDICARE | Primary: Internal Medicine

## 2022-11-26 DIAGNOSIS — C160 Malignant neoplasm of cardia: Secondary | ICD-10-CM

## 2022-11-26 LAB — LEUKEMIA / LYMPHOMA PHENOTYPE

## 2022-11-26 MED ORDER — NORMAL SALINE FLUSH 0.9 % IV SOLN
0.9 | INTRAVENOUS | Status: DC | PRN
Start: 2022-11-26 — End: 2022-11-27
  Administered 2022-11-26: 18:00:00 20 mL via INTRAVENOUS

## 2022-11-26 MED FILL — MONOJECT FLUSH SYRINGE 0.9 % IV SOLN: 0.9 % | INTRAVENOUS | Qty: 20

## 2022-11-26 NOTE — Plan of Care (Signed)
Knowledge deficit care plan type treatment plan was reviewed with the patient and the outcome was ongoing plan of care.

## 2022-11-26 NOTE — Discharge Instructions (Signed)
Discharge Note: Patient tolerated treatment well, condition unchanged throughout. Patient was discharged in stable condition. Reinforced post treatment care and patient verbalized understanding. Patient instructed to call for any questions or concerns.

## 2022-12-01 NOTE — Telephone Encounter (Signed)
Attempted to call the patient back in regards to [facility] response but the patient does not have a voicemail box. Will attempt to call the patient again at a later time.

## 2022-12-01 NOTE — Telephone Encounter (Signed)
Patient called wanting to inform the clinic that he is concerned about his continuous weight loss. Patient stated he has lost 8 pounds since last Monday. Patient stated he reached out to the registered dietician Sue Lush and was told she will call him back shortly. Please advise.

## 2022-12-02 NOTE — Telephone Encounter (Signed)
Called and informed the patient of Kelly's message. Patient verbalized understanding. Patient stated he is not nauseous. Patient did also want to inform us that starting yesterday he has noticed a pain in his left side starting from under his ribs to his navel. Patient stated the pain is only noticeable when he takes a deep breath. Patient states the pain is a 6/10. Patient also wanted to note that he backed off his Miralax as he was having 5-6 bowel movements a day and now he is constipated again. I advised the patient to increase his miralax again. Please advise.

## 2022-12-02 NOTE — Telephone Encounter (Signed)
Called and verified that the patient is taking his pain medications. Patient verbalized he takes Oxycontin 10mg  twice daily and takes Percocet 7.5-325mg  every 6 hours as well. Patient verbalized the pain is still 6/10 after he takes the pain medication and that the pain medication wears off after 3 hours. Patient did state the pain is bearable but just wanted [place] to know that he has started having pain in a new location. Please advise.

## 2022-12-02 NOTE — Telephone Encounter (Signed)
Patient called back and was informed of Kelly's message. Patient verbalized understanding. No further action is needed at this time.

## 2022-12-02 NOTE — Telephone Encounter (Signed)
Outpatient Oncology Registered Dietitian  Nutrition Assessment and Patient Education Note    Reason for Referral: Weight loss  Referral Source: Loura Pardon  Date of Initial Encounter: 11/14/2022  Date of Current Encounter: 12/02/2022  Type of Encounter: Telephone    Assessment: Jesse Savage is dx with stage IV gastric cancer dx on 10/25/2022. PET/CT which confirmed metastatic disease involving the liver as well as the lungs. He is planned to proceed with FOLFOX for management of his disease.     PMHx:  Back pain, OSA, sciatica  Social History: Reviewed  Social/Environmental/Physical Needs: Lives with his wife at home. She makes food for him and assists with things around the house. He states he is not able to walk more than 30 yards before getting out of breath. Everything hurts when he stands up     Nutrition-related labs:  sodium 133 (low), creatinine 0.6 (low), AST 82 (high), alk phos 381 (high)  Nutrition-related medications:  oxycontin, miracle mouth wash, omeprazole, gas-x, docusate, zofran, phenergan, miralax, oxycodone  Skin: intact  GI: some nausea, no vomiting. +constipation, using miralax  Chewing/Swallowing: no difficulties chewing or swallowing    Oral Nutrition History:   Reports appetite has be low and "wanting a pill for protein." . Mr. Foxx reports altered taste currently. His wife makes him food - banana shakes (2 frozen bananas, 1 scoop ice cream, 1/2 cup milk, + 30 g protein powder). He reports not drinking this on days he has chemo and 5 days after chemo. He reports liking to eat peanut butter and jelly sandwiches, eggs with bacon and biscuits. He is able to drink 2 boost per day - this varies most days.  Nutrition Support History: no hx of nutrition support    Anthropometrics:   Ht Readings from Last 1 Encounters:   11/17/22 1.753 m (5\' 9" )       Wt Readings from Last 1 Encounters:   11/24/22 70.9 kg (156 lb 3.2 oz)      BMI: 23.07 (normal)    Ideal body weight: 70.7 kg (155 lb 13.8 oz)      Weight History:   UBW: 180 lb in May 2024. He reports he was 146 lb this morning (12/02/2022). This reflects 19% weight loss in 1 month, severe weight loss     Wt Readings from Last 10 Encounters:   11/24/22 70.9 kg (156 lb 3.2 oz)   11/17/22 69.4 kg (153 lb)   11/10/22 73.2 kg (161 lb 4.8 oz)   11/06/22 72.6 kg (160 lb)   10/31/22 72.5 kg (159 lb 13.3 oz)   10/29/22 72.7 kg (160 lb 3.2 oz)   10/27/22 73.5 kg (162 lb)   06/12/21 75.4 kg (166 lb 3.2 oz)   05/01/21 75.8 kg (167 lb)   02/20/21 77.1 kg (170 lb)      Nutrition Focused Physical Exam: deferred, phone call    Diagnosis: Severe malnutrition in the context of chronic illness related to Stage IV Gastric Cancer as evidence by severe weight loss of 19% in 1 month, meeting less than 75% of EER for greater than 1 month    Estimated Nutrition Needs:  EER (30-35 kcal/kg): 2127-2482 kcal/day  EPR (1.5-2.0 g/kg): 106-141 g pro/day  Carbohydrate (50% EER): 266-310 g carbohydrate per day  Fluid (1 ml/kcal): 2100-2500 ml/day or per MD    Goals:  Drink 2 protein drinks or smoothies per day  Eat 1 source of protein at each meal/snack  Maintain weight     Interventions:  Nutrition Education:  Meal patterns:  Recommend eating smaller meals during the day. We discussed trial and error of foods and sticking with foods that work well for him. I educated on importance of meeting kcal and protein needs. Educated on importance of protein and kcals to help preserve muscle mass.  Nutrient dense foods:   I recommended nutrient dense foods that provide the most kcal and protein in a small serving - example: peanut butter. Recommend adding peanut butter to banana shakes for additional kcal and protein. We discussed the use of protein drinks. Encourage the use of whole milk, cream, full fat dairy products, sour cream, avocados  Supplements:   He is currently drinking Boost original. I recommended switching to Boost VHC (530 kcal and 22 g protein) and Reason (450 kcal and 18 g  protein) as this provides more kcal and protein than the Boost original. Recommend mixing this with banana shakes and/or smoothies. Discussed the use protein powder - recommended unflavored protein powder. Add protein powder to sauces like gravy, eggs, grits, etc to increase kcal and protein intake  Taste changes:   Recommend sticking with foods that work well for him currently. Educated on using certain foods to help combat some altered taste. Example: if something is too sour, add something sweet like maple syrup, if something is too salty, add a squeeze of lemon, lime, or orange.   Constipation:   Continue with miralax to help with bowel movements.     Education Handouts: Previously mailed  mini meals, high protein breakfast and lunch ideas, shakes and smoothies, and ONS education material. Will follow up on 12/08/2022 to provide education material   Supplements: Will provide samples of Boost VHC (530 kcal and 22 g protein) and Reason (450 kcal and 18 g protein)   Collaboration of care: Wilson Singer, APRN-NP with Dr. Durwin Reges    Compliance: Good  Engagement: Good, asked appropriate questions   Further needs: Nutrition education PRN    Monitoring and Evaluation: PO intake/adequacy, labs/lytes, bowel movements, weight trends, GI distress, and/or need for additional nutrition education    Follow-Up: RD to follow-up as able, patient/family/caregiver may reach out in the interim. RD contact information has been provided. RD remains available PRN.     Ammie Ferrier, RD, LD  Registered Dietitian  [facility] Ser. [facility]  [facility]  [address], [address]  [place], [place] [address]  [phone] O  [phone] F

## 2022-12-02 NOTE — Telephone Encounter (Signed)
Attempted to call the patient in regards to [facility] response but the patient did not answer and I was unable to leave a voicemail. Will attempt to call the patient again at a later time.

## 2022-12-05 ENCOUNTER — Encounter

## 2022-12-08 ENCOUNTER — Ambulatory Visit
Admit: 2022-12-08 | Discharge: 2022-12-08 | Payer: MEDICARE | Attending: Hematology & Oncology | Primary: Internal Medicine

## 2022-12-08 ENCOUNTER — Ambulatory Visit: Admit: 2022-12-08 | Discharge: 2022-12-08 | Payer: MEDICARE | Primary: Internal Medicine

## 2022-12-08 ENCOUNTER — Ambulatory Visit: Payer: MEDICARE | Primary: Internal Medicine

## 2022-12-08 VITALS — BP 101/64 | HR 100 | Temp 97.60000°F | Ht 69.0 in | Wt 152.4 lb

## 2022-12-08 DIAGNOSIS — C160 Malignant neoplasm of cardia: Principal | ICD-10-CM

## 2022-12-08 DIAGNOSIS — Z5111 Encounter for antineoplastic chemotherapy: Secondary | ICD-10-CM

## 2022-12-08 LAB — COMPREHENSIVE METABOLIC PANEL
ALT: 25 U/L (ref 0–50)
AST: 68 U/L — ABNORMAL HIGH (ref 0–50)
Albumin/Globulin Ratio: 0.9 — ABNORMAL LOW (ref 1.0–2.7)
Albumin: 2.9 g/dL — ABNORMAL LOW (ref 3.5–5.2)
Alk Phosphatase: 348 U/L — ABNORMAL HIGH (ref 40–130)
Anion Gap: 4 mmol/L (ref 2–17)
BUN: 19 mg/dL (ref 8–23)
CO2: 25 mmol/L (ref 22–29)
Calcium: 8.6 mg/dL — ABNORMAL LOW (ref 8.8–10.2)
Chloride: 101 mmol/L (ref 98–107)
Creatinine: 0.6 mg/dL — ABNORMAL LOW (ref 0.7–1.0)
Est, Glom Filt Rate: 108 mL/min/1.73m?? (ref >=60)
Globulin: 3.1 g/dL (ref 1.9–4.4)
Glucose: 143 mg/dL — ABNORMAL HIGH (ref 70–99)
Potassium: 4.3 mmol/L (ref 3.5–5.3)
Sodium: 130 mmol/L — ABNORMAL LOW (ref 135–145)
Total Bilirubin: 0.41 mg/dL (ref 0.00–1.20)
Total Protein: 6 g/dL — ABNORMAL LOW (ref 6.4–8.3)

## 2022-12-08 LAB — CBC WITH AUTO DIFFERENTIAL
Basophils %: 1 % (ref 0–2)
Basophils Absolute: 0 10*3/uL (ref 0–2)
Eosinophils %: 3 % (ref 0–7)
Eosinophils Absolute: 0.1 10*3/uL (ref 0.0–0.5)
Hematocrit: 24.4 % — ABNORMAL LOW (ref 38.0–52.0)
Hemoglobin: 7.1 g/dL — ABNORMAL LOW (ref 13.0–17.3)
Immature Grans (Abs): 0.05 10*3/uL (ref 0.00–0.06)
Immature Granulocytes %: 1 % — ABNORMAL HIGH (ref 0.0–0.6)
Lymphocytes Absolute: 1.1 10*3/uL (ref 1.0–3.2)
Lymphocytes: 21.7 % (ref 15.0–45.0)
MCH: 23.2 pg — ABNORMAL LOW (ref 27.0–34.5)
MCHC: 29.1 g/dL — ABNORMAL LOW (ref 32.0–36.0)
MCV: 79.7 fL — ABNORMAL LOW (ref 84.0–100.0)
MPV: 9.4 fL (ref 7.2–13.2)
Monocytes %: 47.1 10*3/uL — ABNORMAL HIGH (ref 4.0–12.0)
Monocytes Absolute: 2.3 10*3/uL — ABNORMAL HIGH (ref 0.3–1.0)
Neutrophils %: 26.9 % — ABNORMAL LOW (ref 42.0–74.0)
Neutrophils Absolute: 1.3 10*3/uL — ABNORMAL LOW (ref 1.6–7.3)
Platelets: 483 10*3/uL — ABNORMAL HIGH (ref 140–440)
RBC: 3.06 10*3/uL — ABNORMAL LOW (ref 4.00–5.60)
RDW: 17 % — ABNORMAL HIGH (ref 11.0–16.0)
WBC: 4.9 10*3/uL (ref 3.8–10.6)

## 2022-12-08 MED ORDER — LEUCOVORIN CALCIUM 350 MG IJ SOLR
350 MG | Freq: Once | INTRAMUSCULAR | Status: AC
Start: 2022-12-08 — End: 2022-12-08
  Administered 2022-12-08: 17:00:00 750 mg/m2 via INTRAVENOUS

## 2022-12-08 MED ORDER — NORMAL SALINE FLUSH 0.9 % IV SOLN
0.9 % | INTRAVENOUS | Status: AC | PRN
Start: 2022-12-08 — End: 2022-12-09
  Administered 2022-12-08: 16:00:00 10 mL via INTRAVENOUS
  Administered 2022-12-08: 20:00:00 20 mL via INTRAVENOUS

## 2022-12-08 MED ORDER — PALONOSETRON HCL 0.25 MG/5ML IV SOLN
0.25 | Freq: Once | INTRAVENOUS | Status: AC
Start: 2022-12-08 — End: 2022-12-08
  Administered 2022-12-08: 16:00:00 0.25 mg via INTRAVENOUS

## 2022-12-08 MED ORDER — FLUOROURACIL 500 MG/10ML IV SOLN
500 | Freq: Once | INTRAVENOUS | Status: AC
Start: 2022-12-08 — End: 2022-12-08
  Administered 2022-12-08: 20:00:00 750 mg/m2 via INTRAVENOUS

## 2022-12-08 MED ORDER — DEXAMETHASONE 12 MG IN NS 50 ML IVPB
12 | Freq: Once | INTRAVENOUS | Status: AC
Start: 2022-12-08 — End: 2022-12-08
  Administered 2022-12-08: 16:00:00 12 mg via INTRAVENOUS

## 2022-12-08 MED ORDER — FLUOROURACIL 500 MG/10ML IV SOLN
50010 MG/10ML | INTRAVENOUS | Status: AC
Start: 2022-12-08 — End: 2022-12-10
  Administered 2022-12-08: 20:00:00 4500 mg/m2 via INTRAVENOUS

## 2022-12-08 MED ORDER — DEXTROSE 5 % IV SOLN
5 % | Freq: Once | INTRAVENOUS | Status: AC
Start: 2022-12-08 — End: 2022-12-08
  Administered 2022-12-08: 17:00:00 150 mg via INTRAVENOUS

## 2022-12-08 MED ORDER — NIVOLUMAB 10 MG/ML IV (MIXTURES ONLY)
10010 MG/10ML | Freq: Once | INTRAVENOUS | Status: AC
Start: 2022-12-08 — End: 2022-12-08
  Administered 2022-12-08: 17:00:00 240 mg via INTRAVENOUS

## 2022-12-08 MED FILL — LEUCOVORIN CALCIUM 350 MG IJ SOLR: 350 MG | INTRAMUSCULAR | Qty: 37.5

## 2022-12-08 MED FILL — FLUOROURACIL 5 GM/100ML IV SOLN: 5 GM/100ML | INTRAVENOUS | Qty: 90

## 2022-12-08 MED FILL — OXALIPLATIN 100 MG/20ML IV SOLN: 100 MG/20ML | INTRAVENOUS | Qty: 20

## 2022-12-08 MED FILL — DEXAMETHASONE 12 MG IN NS 50 ML IVPB: 12 mg/50 mL | INTRAVENOUS | Qty: 50

## 2022-12-08 MED FILL — PALONOSETRON HCL 0.25 MG/5ML IV SOLN: 0.25 MG/5ML | INTRAVENOUS | Qty: 5

## 2022-12-08 MED FILL — OPDIVO 240 MG/24ML IV SOLN: 240 MG/24ML | INTRAVENOUS | Qty: 24

## 2022-12-08 MED FILL — FLUOROURACIL 5 GM/100ML IV SOLN: 5 GM/100ML | INTRAVENOUS | Qty: 15

## 2022-12-08 NOTE — Progress Notes (Signed)
Patient: Jesse Savage  DOB: 06/01/1961 (62 y.o.)  Date: 12/08/2022  Attending Physician: Evalyn Casco., MD  Advanced Practice Provider: Yvonne Kendall, APRN - NP      PRINCIPAL DIAGNOSIS:    ICD-10-CM    1. 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 Anemia associated with chemotherapy  D64.81     T45.1X5A       3. Leg swelling  M79.89 Vascular duplex lower extremity venous bilateral          CHIEF COMPLAINT:  Fatigue, night sweats, left sided abdominal pain    HISTORY OF PRESENT ILLNESS:   Mr. Jesse Savage returns today for comprehensive reassessment regarding stage IV gastric SCC.  He is due for cycle 3 FOLFOX, nivolumab was added to cycle 2 given PD-L1 positivity.  He states that he is having increased fatigue, persistent night sweats, and worsening abdominal pain.  His abdominal pain is particular left-sided and is exacerbated with movement.  He denies any nausea or vomiting.  He has occasional constipation but has been compliant with MiraLAX 3-4 times daily.  His appetite remains poor, but he is trying to maintain protein intake with boost supplements in addition to his regular diet.  He denies any fevers, chills, cough, or dyspnea.  He denies any unexplained bleeding or bruising.  He has developed lower extremity edema.  He states he has had a blood clot in the past following a back surgery.    ONCOLOGIC HISTORY:  Metastatic gastric cancer, squamous cell differentiation-PD-L1 positive, HER2 positive  Originally presenting to the emergency department with a 20 pound weight loss, abdominal discomfort, and low grade fevers  CT abdomen/pelvis 10/25/22: There is a gastric mass at the gastric fundus measuring approximately 4.2 x 4.0 cm in size with multiple abnormal hepatic masses, multiple abnormally enlarged   lymph nodes, and multiple abnormal pulmonary nodules likely  representing   extensive metastatic disease.  CTA chest 10/25/22: No evidence of acute pulmonary embolism as clinically queried. No pneumonia. No pleural effusion. No pneumothorax. Multiple bilateral pulmonary nodules with multiple hepatic lesions most likely representing extensive metastatic disease.  5/22  PET/CT 10/31/22: Intensely hypermetabolic gastric cardia mass with associated regional   hypermetabolic lymphadenopathy and more distant, fairly extensive pulmonary and   hepatic metastases.  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, squamous cell  10/31/22 IR Port placement  11/10/22 cycle 1 day 1 FOLFOX  11/24/22 added Nivolumab     PAST MEDICAL HISTORY:  Past Medical History:   Diagnosis Date    Back pain     Lumbar disc disease     OSA (obstructive sleep apnea)     Doesnt use machine    Sciatica        PAST SURGICAL HISTORY:  Past Surgical History:   Procedure Laterality Date    BACK SURGERY  06/2010    L5-S1 REMOVAL OF SCREWS    CERVICAL SPINE SURGERY  08/26/2019    C5-C6 ACDF    FACIAL SURGERY  1981    HERNIA REPAIR  2002    IR PORT PLACEMENT EQUAL OR GREATER THAN 5 YEARS  10/31/2022    IR PORT PLACEMENT EQUAL OR GREATER THAN 5 YEARS 10/31/2022 Tana Conch, MD [facility]    LUMBAR FUSION  04/22/2011    L4-L5 PLIF USING DePUY SPACER, BMP,  AUTOGRAFT, LATERAL FUSION USING SYNTHES SCREWS    THUMB ARTHROSCOPY  1995    TONSILLECTOMY AND ADENOIDECTOMY  1972       HOME MEDICATIONS:  Current Outpatient Medications   Medication Sig Dispense Refill    oxyCODONE (OXYCONTIN) 10 MG extended release tablet Take 1 tablet by mouth 2 times daily for 30 days. Intended supply: 30 days Max Daily Amount: 20 mg 60 tablet 0    Magic Mouthwash (MIRACLE MOUTHWASH) Swish and spit 5 mLs 4 times daily as needed for Irritation Diphenhyddramine: nystatin: lidocaine: maalox 1200 mL 4    OLANZapine (ZYPREXA) 5 MG tablet Take 1 tablet by mouth nightly 30 tablet 3     OMEPRAZOLE PO Take 40 mg by mouth      Simethicone (GAS-X PO) Take by mouth 2 times daily      BISACODYL PO Take by mouth      DOCUSATE SODIUM PO Take by mouth      ondansetron (ZOFRAN-ODT) 8 MG TBDP disintegrating tablet Take 1 tablet by mouth every 8 hours as needed for Nausea or Vomiting (nausea, vomiting) 30 tablet 1    promethazine (PHENERGAN) 25 MG tablet Take 1 tablet by mouth every 6 hours as needed for Nausea 30 tablet 1    Polyethylene Glycol 3350 (MIRALAX PO) Take 17 g by mouth 2 times daily      Naproxen Sodium (ALEVE) 220 MG CAPS Take 1 tablet by mouth 2 times daily      oxyCODONE-acetaminophen (PERCOCET) 7.5-325 MG per tablet Take 1 tablet by mouth every 6 hours as needed.       No current facility-administered medications for this visit.        ALLERGIES:  Codeine, Dilaudid [hydromorphone hcl], Duloxetine hcl, Gabapentin, Hydrocodone, Pregabalin, and Tramadol hcl    SOCIAL HISTORY:  Social History     Socioeconomic History    Marital status: Married   Tobacco Use    Smoking status: Never    Smokeless tobacco: Never   Vaping Use    Vaping Use: Never used   Substance and Sexual Activity    Alcohol use: Not Currently    Drug use: Never        FAMILY HISTORY:  Family History   Problem Relation Age of Onset    Heart Failure Father     Diabetes Father     Cancer Father         Bladder Cancer    Diabetes Paternal Grandmother     Heart Failure Paternal Grandfather         REVIEW OF SYSTEMS:   A comprehensive 12 system review of systems is negative with the exception of pertinent findings above.     VITAL SIGNS:    BP 101/64 (Site: Right Upper Arm, Position: Sitting, Cuff Size: Medium Adult)   Pulse 100   Temp 97.6 F (36.4 C) (Oral)   Ht 1.753 m (5\' 9" )   Wt 69.1 kg (152 lb 6.4 oz)   SpO2 94%   BMI 22.51 kg/m     PHYSICAL EXAM:    Constitutional: NAD. Appears older than stated age; temporal wasting present  Eyes:  Conjunctiva normal; eyelids normal; PERRLA; sclera anicteric  Ear, Nose, Throat:   External ears and nose normal; hearing grossly normal; bilateral buccal mucositis present; poor dentition  Neck:  Supple; no masses; no adenopathy  Respiratory: Lungs clear to auscultation bilaterally, symmetric chest rise, nonlabored respirations  Cardiovascular:  Normal heart sounds; regular rate and rhythm; 2+ bilateral  lower extremity edema, left greater than right   Abdomen: Abdomen mildly distended, hypoactive bowel sounds, tender to palpation of right upper and left upper quadrants  Lymph nodes:  No cervical adenopathy; no axillary adenopathy; no supraclavicular adenopathy  Skin:  No rash; no petechiae; no ecchymoses; no pallor; no cutaneous nodules;  Musculoskeletal:  Normal muscle strength  Neurologic:  No focal motor or sensory deficit; cranial nerves intact; normal gait; no abnormal mental status  Psychiatric:  Oriented to person time and place; mood and affect appropriate to situation; appropriate judgment and insight; memory intact  Access: Right upper chest port    LABORATORY DATA:  Recent Results (from the past 24 hour(s))   Comprehensive Metabolic Panel    Collection Time: 12/08/22 10:29 AM   Result Value Ref Range    Sodium 130 (L) 135 - 145 mmol/L    Potassium 4.3 3.5 - 5.3 mmol/L    Chloride 101 98 - 107 mmol/L    CO2 25 22 - 29 mmol/L    Glucose 143 (H) 70 - 99 mg/dL    BUN 19 8 - 23 mg/dL    Creatinine 0.6 (L) 0.7 - 1.0 mg/dL    Anion Gap 4 2 - 17 mmol/L    Calcium 8.6 (L) 8.8 - 10.2 mg/dL    Total Protein 6.0 (L) 6.4 - 8.3 g/dL    Albumin 2.9 (L) 3.5 - 5.2 g/dL    Globulin 3.1 1.9 - 4.4 g/dL    Albumin/Globulin Ratio 0.9 (L) 1.0 - 2.7    Total Bilirubin 0.41 0.00 - 1.20 mg/dL    Alk Phosphatase 161 (H) 40 - 130 unit/L    AST 68 (H) 0 - 50 unit/L    ALT 25 0 - 50 unit/L    Est, Glom Filt Rate 108 >=60 mL/min/1.39m   CBC With Auto Differential    Collection Time: 12/08/22 10:29 AM   Result Value Ref Range    WBC 4.9 3.8 - 10.6 x10e3/mcL    RBC 3.06 (L) 4.00 - 5.60 x10e3/mcL    Hemoglobin 7.1 (L)  13.0 - 17.3 g/dL    Hematocrit 09.6 (L) 38.0 - 52.0 %    MCV 79.7 (L) 84.0 - 100.0 fL    MCH 23.2 (L) 27.0 - 34.5 pg    MCHC 29.1 (L) 32.0 - 36.0 g/dL    RDW 04.5 (H) 40.9 - 16.0 %    Platelets 483 (H) 140 - 440 x10e3/mcL    MPV 9.4 7.2 - 13.2 fL    Neutrophils % 26.9 (L) 42.0 - 74.0 %    Lymphocytes 21.7 15.0 - 45.0 %    Monocytes % 47.1 (H) 4.0 - 12.0 x10e3/mcL    Eosinophils % 3 0 - 7 %    Basophils % 1 0 - 2 %    Neutrophils Absolute 1.3 (L) 1.6 - 7.3 x10e3/mcL    Lymphocytes Absolute 1.1 1.0 - 3.2 x10e3/mcL    Monocytes Absolute 2.3 (H) 0.3 - 1.0 x10e3/mcL    Eosinophils Absolute 0.1 0.0 - 0.5 x10e3/mcL    Basophils Absolute 0 0 - 2 x10e3/mcL    Immature Granulocytes % 1.0 (H) 0.0 - 0.6 %    Immature Grans (Abs) 0.05 0.00 - 0.06 x10e3/mcL         Impression & Plan:      Stage IV gastric cancer, squamous cell: Proceed with cycle 3 FOLFOX plus nivolumab.  We will plan to repeat imaging after 4 cycles.    Microcytic  anemia: Previous iron studies were not indicative of iron deficiency.  He has had further workup of his anemia to include B12, folate, TSH which were all unremarkable.  At his last office visit, we were notified that there was a blast flag on his CBC so a peripheral flow cytometry was obtained.  There is an absolute monocytosis, and these findings were reviewed with Dr. Berneta Sages.  There could be an underlying myeloproliferative disorder, however for now they remain no indication for treatment given his stage IV gastric cancer and absence of other cytopenias.  We will plan to initiate Aranesp at his next office visit.   Of note he has declined any blood transfusions.    Nausea: Well-controlled, continue ondansetron as needed.    Constipation: Continue MiraLAX 3-4 times daily.    Fever of unknown origin with drenching night sweats: Likely tumor fever.  Fevers have resolved, however he continues to experience drenching night sweats.  Continue Aleve.     Chemotherapy-induced mucositis, improved: Continue  Magic mouthwash    Anorexia: Continue Zyprexa 5 mg nightly.  He will continue 4-6 boost/Ensure a day.  We may increase olanzapine at his next office visit if his appetite has not improved.    Pain of metastatic malignancy:  He is having worsening abdominal pain.  Will discuss increasing OxyContin to 20 mg twice daily.  He will continue to use Percocet as needed for breakthrough pain.    Bilateral lower extremity edema: Obtain venous Doppler to rule out DVT.    Follow-up: 2 weeks     Dr. Berneta Sages. was onsite and available during this office visit.    Yvonne Kendall, APRN - NP    Elements of this note have been dictated using speech recognition software. As a result, errors of speech recognition may have occurred.

## 2022-12-08 NOTE — Plan of Care (Signed)
Knowledge deficit care plan and Opdivo/Folfox treatment plan was reviewed with the patient and significant other and the outcome was reinforced and ongoing plan of care.

## 2022-12-08 NOTE — Progress Notes (Signed)
Outpatient Oncology Registered Dietitian    Reason for Referral: Weight loss  Referral Source: Loura Pardon  Date of Initial Encounter: 11/14/2022  Date of Current Encounter: 12/08/2022  Type of Encounter: In-Person    I dropped off education material and ONS samples during infusion. Please see full RD assessment on 12/02/2022.     Ammie Ferrier, RD, LD  Registered Dietitian  [facility]ina Ser[facility]  [facility]  [address], [address]  [place], [place] [address]  [phone] O  [phone] F

## 2022-12-09 ENCOUNTER — Ambulatory Visit: Payer: MEDICARE | Primary: Internal Medicine

## 2022-12-10 ENCOUNTER — Inpatient Hospital Stay: Admit: 2022-12-10 | Discharge: 2022-12-10 | Payer: MEDICARE | Primary: Internal Medicine

## 2022-12-10 ENCOUNTER — Inpatient Hospital Stay: Admit: 2022-12-10 | Payer: MEDICARE | Primary: Internal Medicine

## 2022-12-10 DIAGNOSIS — C160 Malignant neoplasm of cardia: Principal | ICD-10-CM

## 2022-12-10 DIAGNOSIS — M7989 Other specified soft tissue disorders: Secondary | ICD-10-CM

## 2022-12-10 MED ORDER — NORMAL SALINE FLUSH 0.9 % IV SOLN
0.9 | INTRAVENOUS | Status: DC | PRN
Start: 2022-12-10 — End: 2022-12-11
  Administered 2022-12-10: 18:00:00 20 mL via INTRAVENOUS

## 2022-12-15 ENCOUNTER — Encounter

## 2022-12-15 MED ORDER — OXYCODONE HCL ER 20 MG PO T12A
20 MG | ORAL_TABLET | Freq: Two times a day (BID) | ORAL | 0 refills | Status: AC
Start: 2022-12-15 — End: 2023-01-14

## 2022-12-15 MED ORDER — OXYCODONE-ACETAMINOPHEN 7.5-325 MG PO TABS
7.5-325 MG | ORAL_TABLET | Freq: Four times a day (QID) | ORAL | 0 refills | Status: AC | PRN
Start: 2022-12-15 — End: 2022-12-29

## 2022-12-15 NOTE — Telephone Encounter (Signed)
Called and left a voicemail advising the patient to call the office back. Will attempt to call the patient again at a later time.

## 2022-12-15 NOTE — Telephone Encounter (Signed)
Patient called stating his pain in the left side of his abdomen is getting worse. Patient states it is an 8/10 but jumps to a 10/10 when taking deep breaths. Patient states due to not being able to take deep breaths he is having trouble breathing properly due to the pain. Patient states he needs refills called in on both pain medications since we increased his dosages so he is running out. Patient to take OxyContin 20mg  BID, and Percocet 7.5-325mg  every 4-6 hrs PRN.     Patient also mentioned he is dealing with dry mouth and would like to know what we advise for that. Please advise.

## 2022-12-15 NOTE — Telephone Encounter (Signed)
Patient called back and was informed of Caroline's message. Patient denies nausea and vomiting but does state he is still dealing with constipation. Patient confirmed he is still taking Miralax but only has one small soft bowel movement a day. Patient stated he was evaluated at his doctors today and noticed he is having weight loss. Patient stated his weight today was 145lb. Per [place] Citizen, I informed the patient we will be ordering a CT to see if we can find the cause of his abdominal pain. Patient verbalized understanding and will await the call from the schedulers. No further action is needed at this time.

## 2022-12-16 ENCOUNTER — Inpatient Hospital Stay: Admit: 2022-12-16 | Discharge: 2022-12-16 | Payer: MEDICARE | Primary: Internal Medicine

## 2022-12-16 DIAGNOSIS — R1012 Left upper quadrant pain: Secondary | ICD-10-CM

## 2022-12-16 MED ORDER — BARIUM SULFATE 2 % PO SUSP
2 | Freq: Once | ORAL | Status: AC | PRN
Start: 2022-12-16 — End: 2022-12-16
  Administered 2022-12-16: 18:00:00 450 via ORAL

## 2022-12-16 MED ORDER — IOPAMIDOL 61 % IV SOLN
61 | Freq: Once | INTRAVENOUS | Status: AC | PRN
Start: 2022-12-16 — End: 2022-12-16
  Administered 2022-12-16: 19:00:00 100 via INTRAVENOUS

## 2022-12-18 ENCOUNTER — Encounter

## 2022-12-22 ENCOUNTER — Ambulatory Visit
Admit: 2022-12-22 | Discharge: 2022-12-22 | Payer: MEDICARE | Attending: Hematology & Oncology | Primary: Internal Medicine

## 2022-12-22 ENCOUNTER — Ambulatory Visit: Admit: 2022-12-22 | Discharge: 2022-12-22 | Payer: MEDICARE | Primary: Internal Medicine

## 2022-12-22 ENCOUNTER — Inpatient Hospital Stay: Admit: 2022-12-22 | Discharge: 2022-12-22 | Payer: MEDICARE | Primary: Internal Medicine

## 2022-12-22 DIAGNOSIS — C160 Malignant neoplasm of cardia: Secondary | ICD-10-CM

## 2022-12-22 DIAGNOSIS — D6481 Anemia due to antineoplastic chemotherapy: Secondary | ICD-10-CM

## 2022-12-22 DIAGNOSIS — T451X5A Adverse effect of antineoplastic and immunosuppressive drugs, initial encounter: Secondary | ICD-10-CM

## 2022-12-22 LAB — COMPREHENSIVE METABOLIC PANEL
ALT: 101 U/L — ABNORMAL HIGH (ref 0–50)
AST: 107 U/L — ABNORMAL HIGH (ref 0–50)
Albumin/Globulin Ratio: 0.9 — ABNORMAL LOW (ref 1.0–2.7)
Albumin: 3.1 g/dL — ABNORMAL LOW (ref 3.5–5.2)
Alk Phosphatase: 560 U/L — ABNORMAL HIGH (ref 40–130)
Anion Gap: 7 mmol/L (ref 2–17)
BUN: 14 mg/dL (ref 8–23)
CO2: 23 mmol/L (ref 22–29)
Calcium: 8.8 mg/dL (ref 8.8–10.2)
Chloride: 100 mmol/L (ref 98–107)
Creatinine: 0.6 mg/dL — ABNORMAL LOW (ref 0.7–1.0)
Est, Glom Filt Rate: 111 mL/min/1.73m?? (ref >=60)
Globulin: 3.3 g/dL (ref 1.9–4.4)
Glucose: 100 mg/dL — ABNORMAL HIGH (ref 70–99)
Potassium: 4.5 mmol/L (ref 3.5–5.3)
Sodium: 130 mmol/L — ABNORMAL LOW (ref 135–145)
Total Bilirubin: 0.58 mg/dL (ref 0.00–1.20)
Total Protein: 6.4 g/dL (ref 6.4–8.3)

## 2022-12-22 LAB — CBC WITH AUTO DIFFERENTIAL
Basophils %: 1 % (ref 0–2)
Basophils Absolute: 0 10*3/uL (ref 0–2)
Eosinophils %: 2 % (ref 0–7)
Eosinophils Absolute: 0.1 10*3/uL (ref 0.0–0.5)
Hematocrit: 27.7 % — ABNORMAL LOW (ref 38.0–52.0)
Hemoglobin: 7.7 g/dL — ABNORMAL LOW (ref 13.0–17.3)
Immature Grans (Abs): 0.06 10*3/uL (ref 0.00–0.06)
Immature Granulocytes %: 1 % — ABNORMAL HIGH (ref 0.0–0.6)
Lymphocytes Absolute: 1.1 10*3/uL (ref 1.0–3.2)
Lymphocytes: 17.7 % (ref 15.0–45.0)
MCH: 22.3 pg — ABNORMAL LOW (ref 27.0–34.5)
MCHC: 27.8 g/dL — ABNORMAL LOW (ref 30.0–36.0)
MCV: 80.1 fL — ABNORMAL LOW (ref 84.0–100.0)
MPV: 9.4 fL (ref 7.0–12.2)
Monocytes %: 55.3 10*3/uL — ABNORMAL HIGH (ref 4.0–12.0)
Monocytes Absolute: 3.5 10*3/uL — ABNORMAL HIGH (ref 0.3–1.0)
Neutrophils %: 23.8 % — ABNORMAL LOW (ref 42.0–74.0)
Neutrophils Absolute: 1.5 10*3/uL — ABNORMAL LOW (ref 1.6–7.3)
Platelets: 442 10*3/uL — ABNORMAL HIGH (ref 140–440)
RBC: 3.46 10*3/uL — ABNORMAL LOW (ref 4.00–5.60)
RDW: 19.7 % — ABNORMAL HIGH (ref 10.0–17.0)
WBC: 6.3 10*3/uL (ref 3.8–10.6)

## 2022-12-22 MED ORDER — HANDICAP PLACARD
0 refills | Status: AC
Start: 2022-12-22 — End: ?

## 2022-12-22 MED ORDER — DARBEPOETIN ALFA 150 MCG/0.3ML IJ SOSY
150 | Freq: Once | INTRAMUSCULAR | Status: AC
Start: 2022-12-22 — End: 2022-12-22
  Administered 2022-12-22: 16:00:00 150 ug via SUBCUTANEOUS

## 2022-12-22 MED FILL — ARANESP (ALBUMIN FREE) 150 MCG/0.3ML IJ SOSY: 150 MCG/0.3ML | INTRAMUSCULAR | Qty: 0.3

## 2022-12-22 NOTE — Telephone Encounter (Signed)
Patient called stating he cannot get in for his ECHO until 01/03/23. Patient feels this is too far away and wanted to know your thoughts and if you felt this was appropriate. Please advise.

## 2022-12-22 NOTE — Progress Notes (Signed)
Patient: Jesse Savage  DOB: 09-03-1960 (62 y.o.)  Date: 12/22/2022  Attending Physician: Evalyn Casco., MD  Advanced Practice Provider: Yvonne Kendall, APRN - NP    PRINCIPAL DIAGNOSIS:    ICD-10-CM    1. 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 Encounter for other specified special examinations  Z01.89 Echo (TTE) complete (PRN contrast/bubble/strain/3D)          CHIEF COMPLAINT:  Fatigue, weight loss, night sweats    HISTORY OF PRESENT ILLNESS:   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  He has completed 3 cycles of FOLFOX with nivolumab.  He has had a CT abdomen/pelvis in the interim which unfortunately showed progression in all sites of metastatic disease.  We discussed that we would need to discuss second line therapy given disease progression on first-line therapy.  He notes persistent fatigue and has continued to lose weight.  He is trying to maintain oral intake by adding boost and Ensure supplements to his diet.  He notes mild abdominal discomfort, but his pain has been better controlled after increasing OxyContin to 20 mg twice daily.  He continues to experience night sweats most nights.  He remains compliant with MiraLAX twice daily and is having a daily soft bowel movement.  He denies any unexplained bleeding or bruising.  Previous lower extremity edema has improved with compression stocking use.    ONCOLOGIC HISTORY:  Metastatic gastric cancer, squamous cell differentiation-PD-L1 positive, HER2 positive  Originally presenting to the emergency department with a 20 pound weight loss, abdominal discomfort, and low grade fevers  CT abdomen/pelvis 10/25/22: There is a gastric mass at the gastric fundus measuring approximately 4.2 x 4.0 cm in size with multiple  abnormal hepatic masses, multiple abnormally enlarged   lymph nodes, and multiple abnormal pulmonary nodules likely representing   extensive metastatic disease.  CTA chest 10/25/22: No evidence of acute pulmonary embolism as clinically queried. No pneumonia. No pleural effusion. No pneumothorax. Multiple bilateral pulmonary nodules with multiple hepatic lesions most likely representing extensive metastatic disease.  5/22  PET/CT 10/31/22: Intensely hypermetabolic gastric cardia mass with associated regional   hypermetabolic lymphadenopathy and more distant, fairly extensive pulmonary and   hepatic metastases.  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, squamous cell  10/31/22 IR Port placement  11/10/22 cycle 1 day 1 FOLFOX  11/24/22 added Nivolumab   12/16/22 CT abdomen/pelvis: Progression of neoplasm at all pre-existing sites, multiple coiling lesions in the lung bases, dominant left-sided lesion 3.3 cm previously 2.4 cm, extensive intrahepatic metastatic burden also increased, representative lesion 5 cm previously 4 cm, increased size of bulky low-density upper abdominal adenopathy including periaortic distribution, prominent gastric cardia mass increased in prominence    PAST MEDICAL HISTORY:  Past Medical History:   Diagnosis Date    Back pain     Lumbar disc disease     OSA (obstructive sleep apnea)     Doesnt use machine    Sciatica        PAST SURGICAL HISTORY:  Past Surgical History:   Procedure Laterality Date    BACK SURGERY  06/2010    L5-S1 REMOVAL OF SCREWS    CERVICAL SPINE SURGERY  08/26/2019    C5-C6 ACDF    FACIAL SURGERY  1981    HERNIA REPAIR  2002    IR PORT PLACEMENT EQUAL OR GREATER THAN 5 YEARS  10/31/2022    IR PORT PLACEMENT EQUAL OR GREATER THAN 5 YEARS 10/31/2022 Tana Conch, MD [facility]    LUMBAR FUSION  04/22/2011    L4-L5 PLIF USING DePUY SPACER, BMP, AUTOGRAFT, LATERAL FUSION USING SYNTHES SCREWS    THUMB  ARTHROSCOPY  1995    TONSILLECTOMY AND ADENOIDECTOMY  1972       HOME MEDICATIONS:  Current Outpatient Medications   Medication Sig Dispense Refill    oxyCODONE-acetaminophen (PERCOCET) 7.5-325 MG per tablet Take 1 tablet by mouth every 6 hours as needed for Pain (abdominal pain) for up to 14 days. Max Daily Amount: 4 tablets 60 tablet 0    oxyCODONE (OXYCONTIN) 20 MG extended release tablet Take 1 tablet by mouth 2 times daily for 30 days. Intended supply: 30 days Max Daily Amount: 40 mg 60 tablet 0    omeprazole (PRILOSEC) 40 MG delayed release capsule       oxyCODONE (OXYCONTIN) 10 MG extended release tablet Take 1 tablet by mouth 2 times daily for 30 days. Intended supply: 30 days Max Daily Amount: 20 mg 60 tablet 0    Magic Mouthwash (MIRACLE MOUTHWASH) Swish and spit 5 mLs 4 times daily as needed for Irritation Diphenhyddramine: nystatin: lidocaine: maalox 1200 mL 4    OLANZapine (ZYPREXA) 5 MG tablet Take 1 tablet by mouth nightly 30 tablet 3    Simethicone (GAS-X PO) Take by mouth 2 times daily      BISACODYL PO Take by mouth      DOCUSATE SODIUM PO Take by mouth      ondansetron (ZOFRAN-ODT) 8 MG TBDP disintegrating tablet Take 1 tablet by mouth every 8 hours as needed for Nausea or Vomiting (nausea, vomiting) 30 tablet 1    promethazine (PHENERGAN) 25 MG tablet Take 1 tablet by mouth every 6 hours as needed for Nausea 30 tablet 1    Polyethylene Glycol 3350 (MIRALAX PO) Take 17 g by mouth 2 times daily      Naproxen Sodium (ALEVE) 220 MG CAPS Take 1 tablet by mouth 2 times daily       No current facility-administered medications for this visit.        ALLERGIES:  Codeine, Dilaudid [hydromorphone hcl], Duloxetine hcl, Gabapentin, Hydrocodone, Pregabalin, and Tramadol hcl    SOCIAL HISTORY:  Social History     Socioeconomic History    Marital status: Married   Tobacco Use    Smoking status: Never    Smokeless tobacco: Never   Vaping Use    Vaping Use: Never used   Substance and Sexual Activity    Alcohol  use: Not Currently    Drug use: Never        FAMILY HISTORY:  Family History   Problem Relation Age of Onset    Heart Failure Father     Diabetes Father     Cancer Father         Bladder Cancer    Diabetes Paternal Grandmother     Heart Failure Paternal Grandfather         REVIEW OF SYSTEMS:   A comprehensive 12 system review of systems is negative with the exception of pertinent findings above.     VITAL SIGNS:    BP 94/62 (Site: Left Upper Arm, Position: Sitting, Cuff Size: Medium Adult)   Pulse (!) 119   Temp 97.5 F (36.4 C) (  Temporal)   Ht 1.753 m (5\' 9" )   Wt 67 kg (147 lb 9.6 oz)   SpO2 95%   BMI 21.80 kg/m     PHYSICAL EXAM:    Constitutional: NAD. Appears older than stated age; temporal wasting present  Eyes:  Conjunctiva normal; eyelids normal; PERRLA; sclera anicteric  Ear, Nose, Throat:  External ears and nose normal; hearing grossly normal; bilateral buccal mucositis present; poor dentition  Neck:  Supple; no masses; no adenopathy  Respiratory: Lungs clear to auscultation bilaterally, symmetric chest rise, nonlabored respirations  Cardiovascular:  Normal heart sounds; regular rate and rhythm; 2+ bilateral lower extremity edema, left greater than right   Abdomen: Abdomen mildly distended, hypoactive bowel sounds, tender to palpation of right upper and left upper quadrants  Lymph nodes:  No cervical adenopathy; no axillary adenopathy; no supraclavicular adenopathy  Skin:  No rash; no petechiae; no ecchymoses; no pallor; no cutaneous nodules;  Musculoskeletal:  Normal muscle strength  Neurologic:  No focal motor or sensory deficit; cranial nerves intact; normal gait; no abnormal mental status  Psychiatric:  Oriented to person time and place; mood and affect appropriate to situation; appropriate judgment and insight; memory intact  Access: Right upper chest port    LABORATORY DATA:  Recent Results (from the past 24 hour(s))   Comprehensive Metabolic Panel    Collection Time: 12/22/22 10:35 AM    Result Value Ref Range    Sodium 130 (L) 135 - 145 mmol/L    Potassium 4.5 3.5 - 5.3 mmol/L    Chloride 100 98 - 107 mmol/L    CO2 23 22 - 29 mmol/L    Glucose 100 (H) 70 - 99 mg/dL    BUN 14 8 - 23 mg/dL    Creatinine 0.6 (L) 0.7 - 1.0 mg/dL    Anion Gap 7 2 - 17 mmol/L    Calcium 8.8 8.8 - 10.2 mg/dL    Total Protein 6.4 6.4 - 8.3 g/dL    Albumin 3.1 (L) 3.5 - 5.2 g/dL    Globulin 3.3 1.9 - 4.4 g/dL    Albumin/Globulin Ratio 0.9 (L) 1.0 - 2.7    Total Bilirubin 0.58 0.00 - 1.20 mg/dL    Alk Phosphatase 161 (H) 40 - 130 unit/L    AST 107 (H) 0 - 50 unit/L    ALT 101 (H) 0 - 50 unit/L    Est, Glom Filt Rate 111 >=60 mL/min/1.73m   CBC With Auto Differential    Collection Time: 12/22/22 10:35 AM   Result Value Ref Range    WBC 6.3 3.8 - 10.6 x10e3/mcL    RBC 3.46 (L) 4.00 - 5.60 x10e3/mcL    Hemoglobin 7.7 (L) 13.0 - 17.3 g/dL    Hematocrit 09.6 (L) 38.0 - 52.0 %    MCV 80.1 (L) 84.0 - 100.0 fL    MCH 22.3 (L) 27.0 - 34.5 pg    MCHC 27.8 (L) 30.0 - 36.0 g/dL    RDW 04.5 (H) 40.9 - 17.0 %    Platelets 442 (H) 140 - 440 x10e3/mcL    MPV 9.4 7.0 - 12.2 fL    Neutrophils % 23.8 (L) 42.0 - 74.0 %    Lymphocytes 17.7 15.0 - 45.0 %    Monocytes % 55.3 (H) 4.0 - 12.0 x10e3/mcL    Eosinophils % 2 0 - 7 %    Basophils % 1 0 - 2 %    Neutrophils Absolute 1.5 (L) 1.6 - 7.3 x10e3/mcL  Lymphocytes Absolute 1.1 1.0 - 3.2 x10e3/mcL    Monocytes Absolute 3.5 (H) 0.3 - 1.0 x10e3/mcL    Eosinophils Absolute 0.1 0.0 - 0.5 x10e3/mcL    Basophils Absolute 0 0 - 2 x10e3/mcL    Immature Granulocytes % 1.0 (H) 0.0 - 0.6 %    Immature Grans (Abs) 0.06 0.00 - 0.06 x10e3/mcL           Impression & Plan:      Stage IV gastric cancer, squamous cell: He unfortunately has had disease progression following 3 cycles of FOLFOX and nivolumab as detailed above.  Findings were reviewed with Dr. Berneta Sages. at length.  We will discuss further second-line therapy which may consist of Herceptin therapy given HER2 positivity.  I will obtain a baseline  echocardiogram as well as a CT chest to complete restaging.  He will return in 1 week for further discussions regarding salvage therapy.    Microcytic anemia: Previous iron studies were not indicative of iron deficiency.  He has had further workup of his anemia to include B12, folate, TSH which were all unremarkable.  A peripheral flow cytometry has been obtained as there was a blast flag on his CBC.  This confirmed an absolute monocytosis which could represent an underlying myeloproliferative disorder however there is no indication for treatment at this time given no new cytopenias and the state of his metastatic disease.      Nausea: Well-controlled, continue ondansetron as needed.    Constipation: Continue MiraLAX 3-4 times daily.    Fever of unknown origin with drenching night sweats: Likely tumor fever.  Fevers have resolved but he continues to experience drenching night sweats likely due to refractory disease.  He will continue to use Aleve which does improve his symptoms some.    Chemotherapy-induced mucositis, improved: Continue Magic mouthwash as needed.    Anorexia: He will increase Zyprexa to 10 mg every night.  He will continue 4-6 boost/Ensure a day.     Pain of metastatic malignancy:   Pain has improved after increasing OxyContin to 20 mg twice daily.  He also continues to use Percocet as needed for breakthrough pain.    Bilateral lower extremity edema: Venous Doppler negative for DVT.  Edema has improved with compression stockings.    Follow-up: 1 week    Dr. Berneta Sages. was onsite and available during this office visit.    Yvonne Kendall, APRN - NP    Elements of this note have been dictated using speech recognition software. As a result, errors of speech recognition may have occurred.

## 2022-12-25 ENCOUNTER — Ambulatory Visit: Payer: MEDICARE | Primary: Internal Medicine

## 2022-12-26 ENCOUNTER — Encounter

## 2022-12-26 MED ORDER — SIMETHICONE 80 MG PO CHEW
80 | ORAL_TABLET | Freq: Four times a day (QID) | ORAL | 3 refills | Status: DC | PRN
Start: 2022-12-26 — End: 2022-12-30

## 2022-12-26 MED ORDER — OXYCODONE HCL ER 20 MG PO T12A
20 | ORAL_TABLET | Freq: Two times a day (BID) | ORAL | 0 refills | Status: DC
Start: 2022-12-26 — End: 2023-01-08

## 2022-12-26 MED ORDER — OMEPRAZOLE 40 MG PO CPDR
40 | ORAL_CAPSULE | Freq: Every day | ORAL | 3 refills | Status: AC
Start: 2022-12-26 — End: ?

## 2022-12-26 NOTE — Telephone Encounter (Signed)
Called and informed the patient of Caroline's message. Patient verbalized understanding. Patient stated he needs a refill on his Omeprazole as well. No further action is needed at this time.    Requested Prescriptions     Pending Prescriptions Disp Refills    omeprazole (PRILOSEC) 40 MG delayed release capsule 30 capsule 3     Sig: Take 1 capsule by mouth daily     Signed Prescriptions Disp Refills    oxyCODONE (OXYCONTIN) 20 MG extended release tablet 60 tablet 0     Sig: Take 1 tablet by mouth 2 times daily for 30 days. Intended supply: 30 days Max Daily Amount: 40 mg     Authorizing Provider: FLAKE, CAROLINE M    simethicone (MYLICON) 80 MG chewable tablet 180 tablet 3     Sig: Take 1 tablet by mouth 4 times daily as needed (bloating, flatulence)     Authorizing Provider: Yvonne Kendall

## 2022-12-26 NOTE — Addendum Note (Signed)
Addended by: Elenore Paddy R on: 12/26/2022 01:09 PM     Modules accepted: Orders

## 2022-12-26 NOTE — Telephone Encounter (Signed)
Patient called stating he is experiencing stomach bloating and terrible dry mouth. Patient stated he is still taking Miralax and has been having at least 1 bowel movement daily. Patient stated he is using Biotene OTC but it is not working. Patient also stated he has been taking Olanzapine 10mg  at bedtime but that is not helping his appetite so he feels like just stopping the medication. Patient also only got a partial fill on his Oxycodone and is going to run out. Patient needs a refill to be called in. Please advise.    Requested Prescriptions     Pending Prescriptions Disp Refills    oxyCODONE (OXYCONTIN) 20 MG extended release tablet 60 tablet 0     Sig: Take 1 tablet by mouth 2 times daily for 30 days. Intended supply: 30 days Max Daily Amount: 40 mg

## 2022-12-29 ENCOUNTER — Inpatient Hospital Stay
Admission: EM | Admit: 2022-12-29 | Discharge: 2023-01-02 | Disposition: A | Discharge status: Home / Self Care | Payer: Medicare Other | Admitting: Internal Medicine

## 2022-12-29 ENCOUNTER — Encounter

## 2022-12-29 ENCOUNTER — Inpatient Hospital Stay: Admit: 2022-12-29 | Discharge: 2022-12-29 | Payer: MEDICARE | Primary: Internal Medicine

## 2022-12-29 ENCOUNTER — Ambulatory Visit
Admit: 2022-12-29 | Discharge: 2022-12-29 | Payer: MEDICARE | Attending: Hematology & Oncology | Primary: Internal Medicine

## 2022-12-29 ENCOUNTER — Other Ambulatory Visit: Admit: 2022-12-29 | Discharge: 2022-12-29 | Payer: MEDICARE | Primary: Internal Medicine

## 2022-12-29 ENCOUNTER — Emergency Department: Admit: 2022-12-29 | Payer: MEDICARE | Primary: Internal Medicine

## 2022-12-29 DIAGNOSIS — C160 Malignant neoplasm of cardia: Secondary | ICD-10-CM

## 2022-12-29 DIAGNOSIS — C786 Secondary malignant neoplasm of retroperitoneum and peritoneum: Secondary | ICD-10-CM

## 2022-12-29 DIAGNOSIS — A419 Sepsis, unspecified organism: Secondary | ICD-10-CM

## 2022-12-29 LAB — MANUAL DIFFERENTIAL
Absolute Bands: 1.2 10*3/uL
Bands Relative: 4 % (ref 0–5)
Basophils %: 1 % (ref 0–2)
Basophils Absolute: 0.3 10*3/uL — ABNORMAL HIGH (ref 0.0–0.2)
Lymphocytes Absolute: 0.9 10*3/uL — ABNORMAL LOW (ref 1.0–3.2)
Lymphocytes: 3 % — ABNORMAL LOW (ref 15–45)
Monocytes %: 7 % (ref 4–12)
Monocytes Absolute: 2.1 10*3/uL — ABNORMAL HIGH (ref 0.3–1.0)
Myelocyte Percent: 1 %
Myelocytes Absolute: 0.3 10*3/uL
Neutrophils %. Manual count: 84 % — ABNORMAL HIGH (ref 42–74)
Neutrophils Absolute: 25.6 10*3/uL — ABNORMAL HIGH (ref 1.6–7.3)
Platelet Estimate: ADEQUATE
RBC Morphology: ABNORMAL — AB

## 2022-12-29 LAB — CBC WITH AUTO DIFFERENTIAL
Basophils %: 0 % (ref 0–2)
Basophils %: 0.3 % (ref 0.0–2.0)
Basophils Absolute: 0 10*3/uL (ref 0–2)
Basophils Absolute: 0.1 10*3/uL (ref 0.0–0.2)
Eosinophils %: 0 % (ref 0–7)
Eosinophils %: 0.1 % (ref 0.0–7.0)
Eosinophils Absolute: 0 10*3/uL (ref 0.0–0.5)
Eosinophils Absolute: 0 10*3/uL (ref 0.0–0.5)
Hematocrit: 28.1 % — ABNORMAL LOW (ref 38.0–52.0)
Hematocrit: 30.8 % — ABNORMAL LOW (ref 38.0–52.0)
Hemoglobin: 8.1 g/dL — ABNORMAL LOW (ref 13.0–17.3)
Hemoglobin: 9 g/dL — ABNORMAL LOW (ref 13.0–17.3)
Immature Grans (Abs): 1.42 10*3/uL — ABNORMAL HIGH (ref 0.00–0.06)
Immature Grans (Abs): 1.61 10*3/uL — ABNORMAL HIGH (ref 0.00–0.06)
Immature Granulocytes %: 4.8 % — ABNORMAL HIGH (ref 0.0–0.6)
Immature Granulocytes %: 5.3 % — ABNORMAL HIGH (ref 0.0–0.6)
Lymphocytes Absolute: 1.3 10*3/uL (ref 1.0–3.2)
Lymphocytes Absolute: 1.4 10*3/uL (ref 1.0–3.2)
Lymphocytes: 4.4 % — ABNORMAL LOW (ref 15.0–45.0)
Lymphocytes: 4.7 % — ABNORMAL LOW (ref 15.0–45.0)
MCH: 22.8 pg — ABNORMAL LOW (ref 27.0–34.5)
MCH: 22.8 pg — ABNORMAL LOW (ref 27.0–34.5)
MCHC: 28.8 g/dL — ABNORMAL LOW (ref 30.0–36.0)
MCHC: 29.2 g/dL — ABNORMAL LOW (ref 30.0–36.0)
MCV: 78.9 fL — ABNORMAL LOW (ref 84.0–100.0)
MCV: 78.2 fL — ABNORMAL LOW (ref 84.0–100.0)
MPV: 9.4 fL (ref 7.0–12.2)
MPV: 9 fL (ref 7.0–12.2)
Monocytes %: 10.9 10*3/uL (ref 4.0–12.0)
Monocytes %: 11.3 % (ref 4.0–12.0)
Monocytes Absolute: 3.2 10*3/uL — ABNORMAL HIGH (ref 0.3–1.0)
Monocytes Absolute: 3.5 10*3/uL — ABNORMAL HIGH (ref 0.3–1.0)
NRBC Absolute: 0.02 mOsm/kg — ABNORMAL HIGH (ref 0.000–0.012)
NRBC Absolute: 0.06 10*3/uL — ABNORMAL HIGH (ref 0.000–0.012)
NRBC Automated: 0.1 % (ref 0.0–0.2)
NRBC Automated: 0.2 % (ref 0.0–0.2)
Neutrophils %: 79.5 % — ABNORMAL HIGH (ref 42.0–74.0)
Neutrophils %: 78.3 % — ABNORMAL HIGH (ref 42.0–74.0)
Neutrophils Absolute: 23.8 10*3/uL — ABNORMAL HIGH (ref 1.6–7.3)
Neutrophils Absolute: 23.9 10*3/uL — ABNORMAL HIGH (ref 1.6–7.3)
Platelets: 387 10*3/uL (ref 140–440)
Platelets: 434 10*3/uL (ref 140–440)
RBC: 3.56 10*3/uL — ABNORMAL LOW (ref 4.00–5.60)
RBC: 3.94 x10e6/mcL — ABNORMAL LOW (ref 4.00–5.60)
RDW: 20.5 % — ABNORMAL HIGH (ref 10.0–17.0)
RDW: 21 % — ABNORMAL HIGH (ref 10.0–17.0)
WBC: 29.9 10*3/uL — ABNORMAL HIGH (ref 3.8–10.6)
WBC: 30.5 10*3/uL — ABNORMAL HIGH (ref 3.8–10.6)

## 2022-12-29 LAB — COMPREHENSIVE METABOLIC PANEL
ALT: 25 U/L (ref 0–50)
ALT: 29 U/L (ref 0–50)
AST: 139 U/L — ABNORMAL HIGH (ref 0–50)
AST: 159 U/L — ABNORMAL HIGH (ref 0–50)
Albumin/Globulin Ratio: 0.8 — ABNORMAL LOW (ref 1.0–2.7)
Albumin/Globulin Ratio: 0.5 — ABNORMAL LOW (ref 1.00–2.70)
Albumin: 2.8 g/dL — ABNORMAL LOW (ref 3.5–5.2)
Albumin: 2.3 g/dL — ABNORMAL LOW (ref 3.5–5.2)
Alk Phosphatase: 736 U/L — ABNORMAL HIGH (ref 40–130)
Alk Phosphatase: 692 U/L — ABNORMAL HIGH (ref 40–130)
Anion Gap: 8 mmol/L (ref 2–17)
Anion Gap: 18 mmol/L — ABNORMAL HIGH (ref 2–17)
BUN: 16 mg/dL (ref 8–23)
BUN: 17 mg/dL (ref 8–23)
CALCIUM,CORRECTED,CCA: 10.1 mg/dL (ref 8.5–10.7)
CO2: 23 mmol/L (ref 22–29)
CO2: 21 mmol/L — ABNORMAL LOW (ref 22–29)
Calcium: 8.2 mg/dL — ABNORMAL LOW (ref 8.8–10.2)
Calcium: 8.7 mg/dL (ref 8.5–10.7)
Chloride: 98 mmol/L (ref 98–107)
Chloride: 91 mmol/L — ABNORMAL LOW (ref 98–107)
Creatinine: 0.8 mg/dL (ref 0.7–1.0)
Creatinine: 0.7 mg/dL (ref 0.7–1.3)
Est, Glom Filt Rate: 102 mL/min/1.73m?? (ref >=60)
Est, Glom Filt Rate: 104 mL/min/1.73m?? (ref >=60)
Globulin: 3.3 g/dL (ref 1.9–4.4)
Globulin: 4.4 g/dL (ref 1.9–4.4)
Glucose: 98 mg/dL (ref 70–99)
Glucose: 103 mg/dL — ABNORMAL HIGH (ref 70–99)
Osmolaliy Calculated: 262 mOsm/kg — ABNORMAL LOW (ref 270–287)
Potassium: 4.5 mmol/L (ref 3.5–5.3)
Potassium: 4.7 mmol/L (ref 3.5–5.3)
Sodium: 129 mmol/L — ABNORMAL LOW (ref 135–145)
Sodium: 130 mmol/L — ABNORMAL LOW (ref 135–145)
Total Bilirubin: 0.81 mg/dL (ref 0.00–1.20)
Total Bilirubin: 0.59 mg/dL (ref 0.00–1.20)
Total Protein: 6.1 g/dL — ABNORMAL LOW (ref 6.4–8.3)
Total Protein: 6.7 g/dL (ref 5.7–8.3)

## 2022-12-29 LAB — PROCALCITONIN: Procalcitonin: 0.65 ng/mL — ABNORMAL HIGH (ref <=0.24)

## 2022-12-29 LAB — LACTATE, SEPSIS
Lactate, Sepsis: 4.1 mmol/L (ref 0.5–2.0)
Lactate, Sepsis: 6.3 mmol/L (ref 0.5–2.0)

## 2022-12-29 MED ORDER — MORPHINE SULFATE (PF) 4 MG/ML IJ SOLN
4 | INTRAMUSCULAR | Status: AC
Start: 2022-12-29 — End: 2022-12-29
  Administered 2022-12-30: 4 mg via INTRAVENOUS

## 2022-12-29 MED ORDER — SODIUM CHLORIDE 0.9 % IV BOLUS
0.9 | Freq: Once | INTRAVENOUS | Status: AC
Start: 2022-12-29 — End: 2022-12-29
  Administered 2022-12-30: 1000 mL via INTRAVENOUS

## 2022-12-29 MED ORDER — ONDANSETRON HCL 4 MG/2ML IJ SOLN
4 | Freq: Once | INTRAMUSCULAR | Status: AC
Start: 2022-12-29 — End: 2022-12-29
  Administered 2022-12-30: 4 mg via INTRAVENOUS

## 2022-12-29 MED ORDER — IOPAMIDOL 61 % IV SOLN
61 | Freq: Once | INTRAVENOUS | Status: AC | PRN
Start: 2022-12-29 — End: 2022-12-29
  Administered 2022-12-29: 22:00:00 100 mL via INTRAVENOUS

## 2022-12-29 MED ORDER — SODIUM CHLORIDE 0.9 % IV BOLUS
0.9 | Freq: Once | INTRAVENOUS | Status: AC
Start: 2022-12-29 — End: 2022-12-29
  Administered 2022-12-29: 22:00:00 1000 mL via INTRAVENOUS

## 2022-12-29 MED ORDER — IOPAMIDOL 61 % IV SOLN
61 | Freq: Once | INTRAVENOUS | Status: AC | PRN
Start: 2022-12-29 — End: 2022-12-29
  Administered 2022-12-29: 20:00:00 100 mL via INTRAVENOUS

## 2022-12-29 MED ORDER — SODIUM CHLORIDE 0.9 % IV SOLN (MINI-BAG)
0.9 | Freq: Once | INTRAVENOUS | Status: AC
Start: 2022-12-29 — End: 2022-12-29
  Administered 2022-12-29: 23:00:00 3375 mg via INTRAVENOUS

## 2022-12-29 MED FILL — ONDANSETRON HCL 4 MG/2ML IJ SOLN: 4 MG/2ML | INTRAMUSCULAR | Qty: 2

## 2022-12-29 MED FILL — PIPERACILLIN SOD-TAZOBACTAM SO 3.375 (3-0.375) G IV SOLR: 3.375 (3-0.375) g | INTRAVENOUS | Qty: 3375

## 2022-12-29 MED FILL — MORPHINE SULFATE (PF) 4 MG/ML IJ SOLN: 4 mg/mL | INTRAMUSCULAR | Qty: 1

## 2022-12-29 MED FILL — MORPHINE SULFATE (PF) 4 MG/ML IJ SOLN: 4 MG/ML | INTRAMUSCULAR | Qty: 1

## 2022-12-29 NOTE — ED Provider Notes (Signed)
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 Has been having low BP, fatigue, fever, and intermittent SOB.          HISTORY OF PRESENT ILLNESS    Patient is a 62 year old male with a history of stomach cancer with metastases presented emergency department with generalized weakness fatigue abdominal pain and distention shortness of breath with standing up.  He was hypotensive and tachycardic in the clinic advised to come to the emergency department for further evaluation workup.  Has not been able to eat or drink much because he has no appetite.  Denies any vomiting or fevers.  He was unable to get his last dose of chemo due to lack of response to previous dose.  Sounds as though he has failed first-line therapy.  Follows with Dr. Phineas Douglas, [facility].    The history is provided by the patient, medical records and the spouse.       Nursing Notes were reviewed.    REVIEW OF SYSTEMS       Review of Systems   All other systems reviewed and are negative.      Except as noted above the remainder of the review of systems was reviewed and negative.       PAST MEDICAL HISTORY     Past Medical History:   Diagnosis Date    Back pain     Lumbar disc disease     OSA (obstructive sleep apnea)     Doesnt use machine    Sciatica        SURGICAL HISTORY       Past Surgical History:   Procedure Laterality Date    BACK SURGERY  06/2010    L5-S1 REMOVAL OF SCREWS    CERVICAL SPINE SURGERY  08/26/2019    C5-C6 ACDF    FACIAL SURGERY  1981    HERNIA REPAIR  2002    IR PORT PLACEMENT EQUAL OR GREATER THAN 5 YEARS  10/31/2022    IR PORT PLACEMENT EQUAL OR GREATER THAN 5 YEARS 10/31/2022 Tana Conch, MD [facility]    LUMBAR FUSION   04/22/2011    L4-L5 PLIF USING DePUY SPACER, BMP, AUTOGRAFT, LATERAL FUSION USING SYNTHES SCREWS    THUMB ARTHROSCOPY  1995    TONSILLECTOMY AND ADENOIDECTOMY  1972       CURRENT MEDICATIONS       Current Discharge Medication List        CONTINUE these medications which have NOT CHANGED    Details   oxyCODONE (OXYCONTIN) 20 MG extended release tablet Take 1 tablet by mouth 2 times daily for 30 days. Intended supply: 30 days Max Daily Amount: 40 mg  Qty: 60 tablet, Refills: 0    Comments: Reduce doses taken as pain becomes manageable  Associated Diagnoses: Pain of metastatic malignancy      !! simethicone (MYLICON) 80 MG chewable tablet Take 1 tablet by mouth 4 times daily as needed (bloating, flatulence)  Qty: 180 tablet, Refills: 3      omeprazole (PRILOSEC) 40 MG delayed release capsule Take 1  capsule by mouth daily  Qty: 30 capsule, Refills: 3      Handicap Placard MISC by Does not apply route  Qty: 1 each, Refills: 0      oxyCODONE-acetaminophen (PERCOCET) 7.5-325 MG per tablet Take 1 tablet by mouth every 6 hours as needed for Pain (abdominal pain) for up to 14 days. Max Daily Amount: 4 tablets  Qty: 60 tablet, Refills: 0    Comments: Reduce doses taken as pain becomes manageable  Associated Diagnoses: Pain of metastatic malignancy      Magic Mouthwash (MIRACLE MOUTHWASH) Swish and spit 5 mLs 4 times daily as needed for Irritation Diphenhyddramine: nystatin: lidocaine: maalox  Qty: 1200 mL, Refills: 4      OLANZapine (ZYPREXA) 5 MG tablet Take 1 tablet by mouth nightly  Qty: 30 tablet, Refills: 3      !! Simethicone (GAS-X PO) Take by mouth 2 times daily      BISACODYL PO Take by mouth      DOCUSATE SODIUM PO Take by mouth      ondansetron (ZOFRAN-ODT) 8 MG TBDP disintegrating tablet Take 1 tablet by mouth every 8 hours as needed for Nausea or Vomiting (nausea, vomiting)  Qty: 30 tablet, Refills: 1      promethazine (PHENERGAN) 25 MG tablet Take 1 tablet by mouth every 6 hours as needed for Nausea  Qty: 30  tablet, Refills: 1      Polyethylene Glycol 3350 (MIRALAX PO) Take 17 g by mouth 2 times daily      Naproxen Sodium (ALEVE) 220 MG CAPS Take 1 tablet by mouth 2 times daily       !! - Potential duplicate medications found. Please discuss with provider.          ALLERGIES     Codeine, Dilaudid [hydromorphone hcl], Duloxetine hcl, Gabapentin, Hydrocodone, Pregabalin, and Tramadol hcl    FAMILY HISTORY       Family History   Problem Relation Age of Onset    Heart Failure Father     Diabetes Father     Cancer Father         Bladder Cancer    Diabetes Paternal Grandmother     Heart Failure Paternal Grandfather         SOCIAL HISTORY       Social History     Socioeconomic History    Marital status: Married   Tobacco Use    Smoking status: Never    Smokeless tobacco: Never   Vaping Use    Vaping Use: Never used   Substance and Sexual Activity    Alcohol use: Not Currently    Drug use: Never       SCREENINGS         Glasgow Coma Scale  Eye Opening: Spontaneous  Best Verbal Response: Oriented  Best Motor Response: Obeys commands  Glasgow Coma Scale Score: 15                     CIWA Assessment  BP: 96/69  Pulse: 99                 PHYSICAL EXAM    (up to 7 for level 4, 8 or more for level 5)     ED Triage Vitals   BP Temp Temp src Pulse Respirations SpO2 Height Weight - Scale   12/29/22 1729 12/29/22 1729 -- 12/29/22 1729 12/29/22 1729 12/29/22 1729 12/29/22 1732 12/29/22 1732   97/65 97.7 F (36.5  C)  (!) 130 20 100 % 1.753 m (5\' 9" ) 65.8 kg (145 lb)       Physical Exam  Vitals and nursing note reviewed.   Constitutional:       Appearance: He is not toxic-appearing.   HENT:      Head: Normocephalic.      Right Ear: External ear normal.      Left Ear: External ear normal.      Nose: Nose normal.      Mouth/Throat:      Mouth: Mucous membranes are dry.   Eyes:      Pupils: Pupils are equal, round, and reactive to light.   Cardiovascular:      Rate and Rhythm: Regular rhythm. Tachycardia present.      Pulses: Normal pulses.       Heart sounds: Normal heart sounds.   Pulmonary:      Effort: Pulmonary effort is normal. Tachypnea present.      Breath sounds: Normal breath sounds.   Abdominal:      General: Bowel sounds are normal.      Palpations: Abdomen is soft. There is mass.      Tenderness: There is abdominal tenderness.   Musculoskeletal:         General: Normal range of motion.      Cervical back: Neck supple.   Skin:     General: Skin is warm and dry.      Capillary Refill: Capillary refill takes less than 2 seconds.   Neurological:      General: No focal deficit present.      Mental Status: He is alert and oriented to person, place, and time.   Psychiatric:         Mood and Affect: Mood normal.         Behavior: Behavior normal.         DIAGNOSTIC RESULTS     EKG: All EKG's are interpreted by the Emergency Department Physician who either signs or Co-signs this chart in the absence of a cardiologist.    RADIOLOGY:   Non-plain film images such as CT, Ultrasound and MRI are read by the radiologist. Plain radiographic images are visualized and preliminarily interpreted by the emergency physician with the below findings:    Interpretation per the Radiologist below, if available at the time of this note:    CT ABDOMEN PELVIS W IV CONTRAST Additional Contrast? None   Final Result   1.  Redemonstrated soft tissue lesion at the GE junction and gastric cardia, not    significantly changed. Redemonstrated metastatic disease with no significant    progression in hepatic disease burden or upper abdominal lymphadenopathy.    Partially visualized metastases in the lung bases, some of which have increased    in size (more completely evaluated on the chest CT from earlier today).    2.  Small volume abdominopelvic free fluid with findings concerning for    peritoneal thickening and enhancement. In the setting of prior metastatic    disease, findings are most suspicious for developing peritoneal carcinomatosis.   3.  Moderately large stool burden,  consistent with constipation.   4.  Degenerative and postoperative changes of the lumbar spine, as above.            ED BEDSIDE ULTRASOUND:   Performed by ED Physician - none    LABS:  Labs Reviewed   CBC WITH AUTO DIFFERENTIAL - Abnormal; Notable for the following components:  Result Value    WBC 30.5 (*)     RBC 3.94 (*)     Hemoglobin 9.0 (*)     Hematocrit 30.8 (*)     MCV 78.2 (*)     MCH 22.8 (*)     MCHC 29.2 (*)     RDW 21.0 (*)     NRBC Absolute 0.060 (*)     Neutrophils % 78.3 (*)     Lymphocytes 4.7 (*)     Neutrophils Absolute 23.9 (*)     Monocytes Absolute 3.5 (*)     Immature Granulocytes % 5.3 (*)     Immature Grans (Abs) 1.61 (*)     All other components within normal limits   COMPREHENSIVE METABOLIC PANEL - Abnormal; Notable for the following components:    Sodium 130 (*)     Chloride 91 (*)     CO2 21 (*)     Glucose 103 (*)     Anion Gap 18 (*)     Osmolaliy Calculated 262 (*)     Albumin 2.3 (*)     Albumin/Globulin Ratio 0.50 (*)     Alk Phosphatase 692 (*)     AST 159 (*)     All other components within normal limits   LACTATE, SEPSIS - Abnormal; Notable for the following components:    Lactate, Sepsis 6.3 (*)     All other components within normal limits   LACTATE, SEPSIS - Abnormal; Notable for the following components:    Lactate, Sepsis 4.1 (*)     All other components within normal limits   MANUAL DIFFERENTIAL - Abnormal; Notable for the following components:    Neutrophils %. Manual count 84 (*)     Lymphocytes 3 (*)     Neutrophils Absolute 25.6 (*)     Lymphocytes Absolute 0.9 (*)     Monocytes Absolute 2.1 (*)     Basophils Absolute 0.3 (*)     RBC Morphology Abnormal (*)     Polychromasia Few (*)     Ovalocytes Few (*)     Target Cells Few (*)     All other components within normal limits   PROCALCITONIN - Abnormal; Notable for the following components:    Procalcitonin 0.65 (*)     All other components within normal limits   CULTURE, URINE   CULTURE, BLOOD 1   URINALYSIS W/  RFLX MICROSCOPIC   ADD ON LAB TEST   LACTATE, SEPSIS   LACTATE, SEPSIS   COMPREHENSIVE METABOLIC PANEL W/ REFLEX TO MG FOR LOW K   CBC WITH AUTO DIFFERENTIAL       All other labs were within normal range or not returned as of this dictation.    EMERGENCY DEPARTMENT COURSE/REASSESSMENT and MDM:   Vitals:    Vitals:    12/29/22 1830 12/29/22 1845 12/29/22 1900 12/29/22 2000   BP: 96/64 105/66 103/64 96/69   Pulse: (!) 105 (!) 101 (!) 101 99   Resp: 14 12 19 20    Temp:       SpO2: 97% 97% 98% 97%   Weight:       Height:           ED Course:    ED Course as of 12/29/22 2031   Mon Dec 29, 2022   1830 Given the CT findings lactic acidosis leukocytosis, suspect could be a back to course so Zosyn was ordered repeat fluids were ordered as well repeat lactic after 1  L was 4.1.  I performed a sepsis reevaluation heart rate is improving blood pressure is also improving patient remains awake and alert. [MB]   1930 I discussed the case with the Dr. Vanessa Barbara also discussed the case with Dr. Janalyn Rouse the hospitalist service agreeable to admission.  Will get another lactic after second liter of IV fluids.     [MB]   2019 WBC(!): 30.5 [MB]   2019 Lactate, Sepsis(!!): 4.1 [MB]   2019 Sodium(!): 130 [MB]   2019 Procalcitonin(!): 0.65 [MB]   2030 Will repeat another lactic after the second liter of fluids.     [MB]      ED Course User Index  [MB] Thayer Dallas, DO       MDM  Number of Diagnoses or Management Options  Lactic acidemia  Peritoneal carcinomatosis (HCC)  Septicemia [facility])  Diagnosis management comments: Patient presented to emergency department tachycardic hypotensive at the setting of known and somewhat new diagnosis of metastatic gastric fundus cancer.    He has received a couple treatments but it sounds as though he had a lack of response.  He has not been eating and drinking much but no vomiting or diarrhea.       Amount and/or Complexity of Data Reviewed  Decide to obtain previous medical records or to obtain history  from someone other than the patient: yes        CONSULTS:  IP CONSULT TO PALLIATIVE CARE  IP CONSULT TO HEM/ONC  IP CONSULT TO PHARMACY    PROCEDURES:  Unless otherwise noted below, none     Critical Care    Performed by: Thayer Dallas, DO  Authorized by: Thayer Dallas, DO    Critical care provider statement:     Critical care time (minutes):  37    Critical care time was exclusive of:  Separately billable procedures and treating other patients    Critical care was necessary to treat or prevent imminent or life-threatening deterioration of the following conditions:  Cardiac failure, circulatory failure, CNS failure or compromise, dehydration, metabolic crisis, renal failure, respiratory failure, sepsis and shock    Critical care was time spent personally by me on the following activities:  Development of treatment plan with patient or surrogate, discussions with consultants, evaluation of patient's response to treatment, examination of patient, obtaining history from patient or surrogate, pulse oximetry, ordering and review of radiographic studies, ordering and review of laboratory studies, ordering and performing treatments and interventions, review of old charts and re-evaluation of patient's condition    Care discussed with: admitting provider        FINAL IMPRESSION      1. Peritoneal carcinomatosis (HCC)    2. Septicemia (HCC)    3. Lactic acidemia          DISPOSITION/PLAN   DISPOSITION Admitted 12/29/2022 08:08:21 PM      PATIENT REFERRED TO:  No follow-up provider specified.    DISCHARGE MEDICATIONS:  Current Discharge Medication List        Controlled Substances Monitoring:          No data to display                (Please note that portions of this note were completed with a voice recognition program.  Efforts were made to edit the dictations but occasionally words are mis-transcribed.)    Thayer Dallas, DO (electronically signed)  Attending Emergency Physician  Thayer Dallas, [place]  12/29/22 2031

## 2022-12-29 NOTE — Progress Notes (Signed)
Patient: Jesse Savage  DOB: 12/02/1960 (62 y.o.)  Date: 12/29/2022  Attending Physician: Evalyn Casco., MD  Advanced Practice Provider: Ida Rogue, ACNP    PRINCIPAL DIAGNOSIS:    ICD-10-CM    1. 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  He has completed 3 cycles of FOLFOX with nivolumab.  He has had a CT abdomen/pelvis in the interim which unfortunately showed progression in all sites of metastatic disease.  At his last visit, we discussed the potential for a second line therapy given disease progression on first-line therapy.  He continues to endorse increased fatigue and anorexia.  Today, he endorses increased dyspnea on minimal exertion and abdominal discomfort.  He continues to require OxyContin 20 mg twice daily for pain control.  He states he is also been having low-grade fevers every night, he states that he feels febrile today.  He is experiencing malaise.  He denies any new or unexplained bleeding or bruising, melena, or hematochezia.  A comprehensive review of systems is otherwise negative.      ONCOLOGIC HISTORY:  Metastatic gastric cancer, squamous cell differentiation-PD-L1 positive, HER2 positive  Originally presenting to the emergency department with a 20 pound weight loss, abdominal discomfort, and low grade fevers  CT abdomen/pelvis 10/25/22: There is a gastric mass at the gastric fundus measuring approximately 4.2 x 4.0 cm in size with multiple abnormal hepatic masses, multiple abnormally enlarged   lymph nodes, and multiple abnormal pulmonary nodules likely representing   extensive metastatic disease.  CTA chest 10/25/22: No evidence of acute pulmonary embolism as clinically queried. No pneumonia. No  pleural effusion. No pneumothorax. Multiple bilateral pulmonary nodules with multiple hepatic lesions most likely representing extensive metastatic disease.  5/22  PET/CT 10/31/22: Intensely hypermetabolic gastric cardia mass with associated regional   hypermetabolic lymphadenopathy and more distant, fairly extensive pulmonary and   hepatic metastases.  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, squamous cell  10/31/22 IR Port placement  11/10/22 cycle 1 day 1 FOLFOX  11/24/22 added Nivolumab   12/16/22 CT abdomen/pelvis: Progression of neoplasm at all pre-existing sites, multiple coiling lesions in the lung bases, dominant left-sided lesion 3.3 cm previously 2.4 cm, extensive intrahepatic metastatic burden also increased, representative lesion 5 cm previously 4 cm, increased size of bulky low-density upper abdominal adenopathy including periaortic distribution, prominent gastric cardia mass increased in prominence    PAST MEDICAL HISTORY:  Past Medical History:   Diagnosis Date    Back pain     Lumbar disc disease     OSA (obstructive sleep apnea)     Doesnt use machine    Sciatica        PAST SURGICAL HISTORY:  Past Surgical History:   Procedure Laterality Date    BACK SURGERY  06/2010    L5-S1 REMOVAL OF SCREWS    CERVICAL SPINE SURGERY  08/26/2019    C5-C6 ACDF    FACIAL SURGERY  1981    HERNIA REPAIR  2002    IR PORT PLACEMENT EQUAL OR GREATER THAN 5 YEARS  10/31/2022    IR PORT PLACEMENT EQUAL OR GREATER THAN 5 YEARS 10/31/2022 Tana Conch, MD [facility]  LUMBAR FUSION  04/22/2011    L4-L5 PLIF USING DePUY SPACER, BMP, AUTOGRAFT, LATERAL FUSION USING SYNTHES SCREWS    THUMB ARTHROSCOPY  1995    TONSILLECTOMY AND ADENOIDECTOMY  1972       HOME MEDICATIONS:  Current Outpatient Medications   Medication Sig Dispense Refill    oxyCODONE (OXYCONTIN) 20 MG extended release tablet Take 1 tablet by mouth 2 times daily for 30 days. Intended  supply: 30 days Max Daily Amount: 40 mg 60 tablet 0    simethicone (MYLICON) 80 MG chewable tablet Take 1 tablet by mouth 4 times daily as needed (bloating, flatulence) 180 tablet 3    omeprazole (PRILOSEC) 40 MG delayed release capsule Take 1 capsule by mouth daily 30 capsule 3    Handicap Placard MISC by Does not apply route 1 each 0    oxyCODONE-acetaminophen (PERCOCET) 7.5-325 MG per tablet Take 1 tablet by mouth every 6 hours as needed for Pain (abdominal pain) for up to 14 days. Max Daily Amount: 4 tablets 60 tablet 0    oxyCODONE (OXYCONTIN) 10 MG extended release tablet Take 1 tablet by mouth 2 times daily for 30 days. Intended supply: 30 days Max Daily Amount: 20 mg 60 tablet 0    Magic Mouthwash (MIRACLE MOUTHWASH) Swish and spit 5 mLs 4 times daily as needed for Irritation Diphenhyddramine: nystatin: lidocaine: maalox 1200 mL 4    OLANZapine (ZYPREXA) 5 MG tablet Take 1 tablet by mouth nightly 30 tablet 3    Simethicone (GAS-X PO) Take by mouth 2 times daily      BISACODYL PO Take by mouth      DOCUSATE SODIUM PO Take by mouth      ondansetron (ZOFRAN-ODT) 8 MG TBDP disintegrating tablet Take 1 tablet by mouth every 8 hours as needed for Nausea or Vomiting (nausea, vomiting) 30 tablet 1    promethazine (PHENERGAN) 25 MG tablet Take 1 tablet by mouth every 6 hours as needed for Nausea 30 tablet 1    Polyethylene Glycol 3350 (MIRALAX PO) Take 17 g by mouth 2 times daily      Naproxen Sodium (ALEVE) 220 MG CAPS Take 1 tablet by mouth 2 times daily       No current facility-administered medications for this visit.        ALLERGIES:  Codeine, Dilaudid [hydromorphone hcl], Duloxetine hcl, Gabapentin, Hydrocodone, Pregabalin, and Tramadol hcl    SOCIAL HISTORY:  Social History     Socioeconomic History    Marital status: Married   Tobacco Use    Smoking status: Never    Smokeless tobacco: Never   Vaping Use    Vaping Use: Never used   Substance and Sexual Activity    Alcohol use: Not Currently    Drug use: Never         FAMILY HISTORY:  Family History   Problem Relation Age of Onset    Heart Failure Father     Diabetes Father     Cancer Father         Bladder Cancer    Diabetes Paternal Grandmother     Heart Failure Paternal Grandfather         REVIEW OF SYSTEMS:   A comprehensive 12 system review of systems is negative with the exception of pertinent findings above.     VITAL SIGNS:    BP (!) 89/60 (Site: Left Upper Arm, Position: Sitting, Cuff Size: Medium Adult)   Pulse (!) 128   Temp 99.1 F (  37.3 C) (Temporal)   Ht 1.753 m (5\' 9" )   Wt 66.8 kg (147 lb 3.2 oz)   SpO2 96%   BMI 21.74 kg/m     PHYSICAL EXAM:    Constitutional: NAD. Appears older than stated age; temporal wasting present  Eyes:  Conjunctiva normal; eyelids normal; PERRLA; sclera anicteric  Ear, Nose, Throat:  External ears and nose normal; hearing grossly normal; bilateral buccal mucositis present; poor dentition  Neck:  Supple; no masses; no adenopathy  Respiratory: Slight bilateral basilar crackles, symmetric chest rise, accessory muscle use  Cardiovascular:  Normal heart sounds; regular rate and rhythm; 2+ bilateral lower extremity edema, left greater than right   Abdomen: Abdomen distended, hypoactive bowel sounds, tender to palpation of right upper and left upper quadrants  Lymph nodes:  No cervical adenopathy; no axillary adenopathy; no supraclavicular adenopathy  Skin:  No rash; no petechiae; no ecchymoses; no pallor; no cutaneous nodules;  Musculoskeletal:  Normal muscle strength  Neurologic:  No focal motor or sensory deficit; cranial nerves intact; normal gait; no abnormal mental status  Psychiatric:  Oriented to person time and place; mood and affect appropriate to situation; appropriate judgment and insight; memory intact  Access: Right upper chest port    LABORATORY DATA:  Recent Results (from the past 24 hour(s))   CBC With Auto Differential    Collection Time: 12/29/22  4:07 PM   Result Value Ref Range    WBC 29.9 (H) 3.8 - 10.6  x10e3/mcL    RBC 3.56 (L) 4.00 - 5.60 x10e3/mcL    Hemoglobin 8.1 (L) 13.0 - 17.3 g/dL    Hematocrit 14.7 (L) 38.0 - 52.0 %    MCV 78.9 (L) 84.0 - 100.0 fL    MCH 22.8 (L) 27.0 - 34.5 pg    MCHC 28.8 (L) 30.0 - 36.0 g/dL    RDW 82.9 (H) 56.2 - 17.0 %    Platelets 387 140 - 440 x10e3/mcL    MPV 9.4 7.0 - 12.2 fL    NRBC Automated 0.1 0.0 - 0.2 %    NRBC Absolute 0.020 (H) 0.000 - 0.012 mOsm/kg    Neutrophils % 79.5 (H) 42.0 - 74.0 %    Lymphocytes 4.4 (L) 15.0 - 45.0 %    Monocytes % 10.9 4.0 - 12.0 x10e3/mcL    Eosinophils % 0 0 - 7 %    Basophils % 0 0 - 2 %    Neutrophils Absolute 23.8 (H) 1.6 - 7.3 x10e3/mcL    Lymphocytes Absolute 1.3 1.0 - 3.2 x10e3/mcL    Monocytes Absolute 3.2 (H) 0.3 - 1.0 x10e3/mcL    Eosinophils Absolute 0.0 0.0 - 0.5 x10e3/mcL    Basophils Absolute 0 0 - 2 x10e3/mcL    Immature Granulocytes % 4.8 (H) 0.0 - 0.6 %    Immature Grans (Abs) 1.42 (H) 0.00 - 0.06 x10e3/mcL             Impression & Plan:      Stage IV gastric cancer, squamous cell: He unfortunately has had disease progression following 3 cycles of FOLFOX and nivolumab as detailed above.  Findings were reviewed with Dr. Berneta Sages. at length.  We will discuss further second-line therapy which may consist of Herceptin therapy given HER2 positivity.  Echocardiogram pending.  CT chest pending.  We will discuss salvage therapy upon review of this most recent scan.    Microcytic anemia: Previous iron studies were not indicative of iron deficiency.  He has had further workup  of his anemia to include B12, folate, TSH which were all unremarkable.  A peripheral flow cytometry has been obtained as there was a blast flag on his CBC.  This confirmed an absolute monocytosis which could represent an underlying myeloproliferative disorder however there is no indication for treatment at this time given no new cytopenias and the state of his metastatic disease.      Nausea: Well-controlled, continue ondansetron as needed.    Constipation: Continue  MiraLAX 3-4 times daily.    Fever of unknown origin with drenching night sweats: Patient continues to endorse low-grade fevers, worsening at night.  Today his white count is elevated at 29.9 with an ANC of 23.8.  He is noted to be hypotensive and tachycardic in clinic.  The patient has been encouraged to present to the emergency department for further evaluation and management of suspected bacteremia.    Chemotherapy-induced mucositis, improved: Continue Magic mouthwash as needed.    Anorexia: Continue Zyprexa 10 mg every night.  He will continue 4-6 boost/Ensure a day.     Pain of metastatic malignancy:   Pain has improved after increasing OxyContin to 20 mg twice daily.  He also continues to use Percocet as needed for breakthrough pain.    Bilateral lower extremity edema: Venous Doppler negative for DVT.  Edema has improved with compression stockings.    Follow-up: 1 week for labs and provider follow-up    Dr. Berneta Sages. was onsite and available during this office visit.    Ida Rogue, ACNP    Elements of this note have been dictated using speech recognition software. As a result, errors of speech recognition may have occurred.

## 2022-12-29 NOTE — H&P (Signed)
HISTORY AND PHYSICAL             Date: 12/29/2022        Patient Name: Jesse Savage     Date of Birth: 1961/01/27      Age:  62 y.o.    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  Patient seen and evaluated at Dr.Geil's office as an unscheduled walk-in due to symptoms.  He describes primary worsening symptoms as being "winded".  He states he has been able to only ambulate short distances without becoming acutely short of breath.  Noted bilateral calf pain.  Left hand tremor.  Has had low-grade fever, diaphoresis, chills.  Has had abdominal distention without appreciable nausea vomiting or diarrhea.  No pleuritic chest pain, cough, sputum production.  No rashes or skin breakdown.  Seen and evaluated in ED after office evaluation by oncology and found to be relatively hypotensive with MAP responding from 70s to 83 after additional volume.  Noted to have lactic acidemia which improved after volume resuscitation and empiric antibiotic therapy with Zosyn.  With concern regarding SIRS with underlying immunosuppression from active gastric carcinoma, was referred for inpatient admission and continued medical stabilization.    Past Medical History     Past Medical History:   Diagnosis Date    Back pain     Lumbar disc disease     OSA (obstructive sleep apnea)     Doesnt use machine    Sciatica         Past Surgical History     Past Surgical History:   Procedure Laterality Date    BACK SURGERY  06/2010    L5-S1 REMOVAL OF SCREWS    CERVICAL SPINE SURGERY  08/26/2019    C5-C6 ACDF    FACIAL SURGERY  1981    HERNIA REPAIR  2002    IR PORT PLACEMENT EQUAL OR GREATER THAN 5 YEARS  10/31/2022    IR PORT PLACEMENT EQUAL OR GREATER THAN 5 YEARS 10/31/2022 Tana Conch, MD  [facility]    LUMBAR FUSION  04/22/2011    L4-L5 PLIF USING DePUY SPACER, BMP, AUTOGRAFT, LATERAL FUSION USING SYNTHES SCREWS    THUMB ARTHROSCOPY  1995    TONSILLECTOMY AND ADENOIDECTOMY  1972        Medications Prior to Admission     Prior to Admission medications    Medication Sig Start Date End Date Taking? Authorizing Provider   oxyCODONE (OXYCONTIN) 20 MG extended release tablet Take 1 tablet by mouth 2 times daily for 30 days. Intended supply: 30 days Max Daily Amount: 40 mg 12/26/22 01/25/23  Yvonne Kendall, APRN - NP   simethicone (MYLICON) 80 MG chewable tablet Take 1 tablet by mouth 4 times daily as needed (bloating, flatulence) 12/26/22   Yvonne Kendall, APRN - NP   omeprazole (PRILOSEC) 40 MG delayed release capsule Take 1 capsule by mouth daily 12/26/22   Yvonne Kendall, APRN - NP   Handicap Placard MISC by Does not apply route 12/22/22   Yvonne Kendall, APRN - NP   oxyCODONE-acetaminophen (PERCOCET) 7.5-325 MG per tablet Take 1 tablet by mouth every 6 hours as needed for Pain (abdominal pain) for up to 14 days. Max Daily  Amount: 4 tablets 12/15/22 12/29/22  Yvonne Kendall, APRN - NP   oxyCODONE (OXYCONTIN) 10 MG extended release tablet Take 1 tablet by mouth 2 times daily for 30 days. Intended supply: 30 days Max Daily Amount: 20 mg 11/24/22 12/24/22  Evalyn Casco., MD   Magic Mouthwash (MIRACLE MOUTHWASH) Swish and spit 5 mLs 4 times daily as needed for Irritation Diphenhyddramine: nystatin: lidocaine: maalox 11/17/22   Dollinger, Stefan Church, APRN - NP   OLANZapine (ZYPREXA) 5 MG tablet Take 1 tablet by mouth nightly  Patient not taking: Reported on 12/29/2022 11/17/22   Dollinger, Stefan Church, APRN - NP   Simethicone (GAS-X PO) Take by mouth 2 times daily    [provider]   BISACODYL PO Take by mouth    [provider]   DOCUSATE SODIUM PO Take by mouth    [provider]   ondansetron (ZOFRAN-ODT) 8 MG TBDP disintegrating tablet Take 1 tablet by mouth every 8  hours as needed for Nausea or Vomiting (nausea, vomiting) 11/06/22   Yvonne Kendall, APRN - NP   promethazine (PHENERGAN) 25 MG tablet Take 1 tablet by mouth every 6 hours as needed for Nausea 11/06/22   Yvonne Kendall, APRN - NP   Polyethylene Glycol 3350 (MIRALAX PO) Take 17 g by mouth 2 times daily 04/11/21   [provider]   Naproxen Sodium (ALEVE) 220 MG CAPS Take 1 tablet by mouth 2 times daily    [provider]        Allergies   Codeine, Dilaudid [hydromorphone hcl], Duloxetine hcl, Gabapentin, Hydrocodone, Pregabalin, and Tramadol hcl    Social History     Social History       Tobacco History       Smoking Status  Never      Smokeless Tobacco Use  Never              Alcohol History       Alcohol Use Status  Not Currently              Drug Use       Drug Use Status  Never              Sexual Activity       Sexually Active  Not Asked                    Family History     Family History   Problem Relation Age of Onset    Heart Failure Father     Diabetes Father     Cancer Father         Bladder Cancer    Diabetes Paternal Grandmother     Heart Failure Paternal Grandfather        Review of Systems   All 14 review of systems have been reviewed and are negative except as mentioned in HPI.    Physical Exam   BP 103/64   Pulse (!) 101   Temp 97.7 F (36.5 C)   Resp 19   Ht 1.753 m (5\' 9" )   Wt 65.8 kg (145 lb)   SpO2 98%   BMI 21.41 kg/m     General: Alert and oriented x3, appropriately conversant.  Appears chronically ill.  Eye: PERRL, EOMI. Injected sclera on left.  HENT: Normocephalic, clear tympanic membranes, normal hearing, lingual mucosa thickened and fissured, no scleral icterus, no sinus tenderness.    Neck: Supple, non-tender, no carotid  bruits, no JVD, no lymphadenopathy.  CV: Regular rate and rhythm without murmur rub or appreciable gallop.  Pulses 2+ no appreciable edema.  Lungs: Clear to auscultation without wheeze rales rhonchi or crackles.  Normal respiratory  effort.  Abdomen: Distended.  No significant rebound tenderness or palpable mass.  Musculoskeletal: Normal range of motion and strength, no tenderness or swelling.  Skin: Skin is warm, dry and pink, no rashes or lesions.  Neurologic: No appreciable focal localizing neurologic deficits or paresthesias.  Psychiatric: Cooperative, appropriate mood and affect.    Labs      Recent Results (from the past 24 hour(s))   Comprehensive Metabolic Panel    Collection Time: 12/29/22  4:07 PM   Result Value Ref Range    Sodium 129 (L) 135 - 145 mmol/L    Potassium 4.5 3.5 - 5.3 mmol/L    Chloride 98 98 - 107 mmol/L    CO2 23 22 - 29 mmol/L    Glucose 98 70 - 99 mg/dL    BUN 16 8 - 23 mg/dL    Creatinine 0.8 0.7 - 1.0 mg/dL    Anion Gap 8 2 - 17 mmol/L    Calcium 8.2 (L) 8.8 - 10.2 mg/dL    Total Protein 6.1 (L) 6.4 - 8.3 g/dL    Albumin 2.8 (L) 3.5 - 5.2 g/dL    Globulin 3.3 1.9 - 4.4 g/dL    Albumin/Globulin Ratio 0.8 (L) 1.0 - 2.7    Total Bilirubin 0.81 0.00 - 1.20 mg/dL    Alk Phosphatase 119 (H) 40 - 130 unit/L    AST 139 (H) 0 - 50 unit/L    ALT 25 0 - 50 unit/L    Est, Glom Filt Rate 102 >=60 mL/min/1.76m   CBC With Auto Differential    Collection Time: 12/29/22  4:07 PM   Result Value Ref Range    WBC 29.9 (H) 3.8 - 10.6 x10e3/mcL    RBC 3.56 (L) 4.00 - 5.60 x10e3/mcL    Hemoglobin 8.1 (L) 13.0 - 17.3 g/dL    Hematocrit 14.7 (L) 38.0 - 52.0 %    MCV 78.9 (L) 84.0 - 100.0 fL    MCH 22.8 (L) 27.0 - 34.5 pg    MCHC 28.8 (L) 30.0 - 36.0 g/dL    RDW 82.9 (H) 56.2 - 17.0 %    Platelets 387 140 - 440 x10e3/mcL    MPV 9.4 7.0 - 12.2 fL    NRBC Automated 0.1 0.0 - 0.2 %    NRBC Absolute 0.020 (H) 0.000 - 0.012 mOsm/kg    Neutrophils % 79.5 (H) 42.0 - 74.0 %    Lymphocytes 4.4 (L) 15.0 - 45.0 %    Monocytes % 10.9 4.0 - 12.0 x10e3/mcL    Eosinophils % 0 0 - 7 %    Basophils % 0 0 - 2 %    Neutrophils Absolute 23.8 (H) 1.6 - 7.3 x10e3/mcL    Lymphocytes Absolute 1.3 1.0 - 3.2 x10e3/mcL    Monocytes Absolute 3.2 (H) 0.3 - 1.0  x10e3/mcL    Eosinophils Absolute 0.0 0.0 - 0.5 x10e3/mcL    Basophils Absolute 0 0 - 2 x10e3/mcL    Immature Granulocytes % 4.8 (H) 0.0 - 0.6 %    Immature Grans (Abs) 1.42 (H) 0.00 - 0.06 x10e3/mcL   EKG 12 Lead    Collection Time: 12/29/22  5:40 PM   Result Value Ref Range    Ventricular Rate 121 BPM    P-R  Interval 128 ms    QRS Duration 84 ms    Q-T Interval 296 ms    QTc Calculation (Bazett) 368 ms    P Axis 62 degrees    R Axis 17 degrees    T Axis 57 degrees    Diagnosis       SINUS TACHYCARDIA  LOW QRS VOLTAGE [QRS DEFLECTION < 0.5/1.0 MV IN LIMB/CHEST LEADS]  ABNORMAL RHYTHM ECG     CBC with Auto Differential    Collection Time: 12/29/22  5:51 PM   Result Value Ref Range    WBC 30.5 (H) 3.8 - 10.6 x10e3/mcL    RBC 3.94 (L) 4.00 - 5.60 x10e6/mcL    Hemoglobin 9.0 (L) 13.0 - 17.3 g/dL    Hematocrit 84.1 (L) 38.0 - 52.0 %    MCV 78.2 (L) 84.0 - 100.0 fL    MCH 22.8 (L) 27.0 - 34.5 pg    MCHC 29.2 (L) 30.0 - 36.0 g/dL    RDW 32.4 (H) 40.1 - 17.0 %    Platelets 434 140 - 440 x10e3/mcL    MPV 9.0 7.0 - 12.2 fL    NRBC Automated 0.2 0.0 - 0.2 %    NRBC Absolute 0.060 (H) 0.000 - 0.012 x10e3/mcL    Neutrophils % 78.3 (H) 42.0 - 74.0 %    Lymphocytes 4.7 (L) 15.0 - 45.0 %    Monocytes % 11.3 4.0 - 12.0 %    Eosinophils % 0.1 0.0 - 7.0 %    Basophils % 0.3 0.0 - 2.0 %    Neutrophils Absolute 23.9 (H) 1.6 - 7.3 x10e3/mcL    Lymphocytes Absolute 1.4 1.0 - 3.2 x10e3/mcL    Monocytes Absolute 3.5 (H) 0.3 - 1.0 x10e3/mcL    Eosinophils Absolute 0.0 0.0 - 0.5 x10e3/mcL    Basophils Absolute 0.1 0.0 - 0.2 x10e3/mcL    Immature Granulocytes % 5.3 (H) 0.0 - 0.6 %    Immature Grans (Abs) 1.61 (H) 0.00 - 0.06 x10e3/mcL   Comprehensive Metabolic Panel    Collection Time: 12/29/22  5:51 PM   Result Value Ref Range    Sodium 130 (L) 135 - 145 mmol/L    Potassium 4.7 3.5 - 5.3 mmol/L    Chloride 91 (L) 98 - 107 mmol/L    CO2 21 (L) 22 - 29 mmol/L    Glucose 103 (H) 70 - 99 mg/dL    BUN 17 8 - 23 mg/dL    Creatinine 0.7 0.7 - 1.3  mg/dL    Anion Gap 18 (H) 2 - 17 mmol/L    Osmolaliy Calculated 262 (L) 270 - 287 mOsm/kg    Calcium 8.7 8.5 - 10.7 mg/dL    CALCIUM,CORRECTED,CCA 10.1 8.5 - 10.7 mg/dL    Total Protein 6.7 5.7 - 8.3 g/dL    Albumin 2.3 (L) 3.5 - 5.2 g/dL    Globulin 4.4 1.9 - 4.4 g/dL    Albumin/Globulin Ratio 0.50 (L) 1.00 - 2.70    Total Bilirubin 0.59 0.00 - 1.20 mg/dL    Alk Phosphatase 027 (H) 40 - 130 unit/L    AST 159 (H) 0 - 50 unit/L    ALT 29 0 - 50 unit/L    Est, Glom Filt Rate 104 >=60 mL/min/1.41m   Lactate, Sepsis    Collection Time: 12/29/22  5:51 PM   Result Value Ref Range    Lactate, Sepsis 6.3 (HH) 0.5 - 2.0 mmol/L   Manual Differential    Collection Time: 12/29/22  5:51 PM   Result Value Ref Range    Neutrophils %. Manual count 84 (H) 42 - 74 %    Bands Relative 4 0 - 5 %    Lymphocytes 3 (L) 15 - 45 %    Monocytes % 7 4 - 12 %    Basophils % 1 0 - 2 %    Myelocyte Percent 1 %    Neutrophils Absolute 25.6 (H) 1.6 - 7.3 x10e3/mcL    Absolute Bands 1.2 x10e3/mcL    Lymphocytes Absolute 0.9 (L) 1.0 - 3.2 x10e3/mcL    Monocytes Absolute 2.1 (H) 0.3 - 1.0 x10e3/mcL    Basophils Absolute 0.3 (H) 0.0 - 0.2 x10e3/mcL    Myelocytes Absolute 0.3 x10e3/mcL    Platelet Estimate Adequate     RBC Morphology Abnormal (A) Normal    Polychromasia Few (A)     Ovalocytes Few (A)     Target Cells Few (A)    Procalcitonin    Collection Time: 12/29/22  5:51 PM   Result Value Ref Range    Procalcitonin 0.65 (H) <=0.24 ng/mL   Lactate, Sepsis    Collection Time: 12/29/22  7:45 PM   Result Value Ref Range    Lactate, Sepsis 4.1 (HH) 0.5 - 2.0 mmol/L        Imaging/Diagnostics Last 24 Hours   CT ABDOMEN PELVIS W IV CONTRAST Additional Contrast? None    Result Date: 12/29/2022  CT abdomen pelvis with contrast: 12/29/22 INDICATION: "abd pain worsening". History of metastatic gastric cancer. COMPARISON: CT 12/16/2022 TECHNIQUE: Routine protocol (Axial portal venous phase imaging from the lung bases to the pubic symphysis following IV contrast  with coronal reconstruction.)  CT scanning was performed using radiation dose reduction techniques when appropriate, per system protocols. FINDINGS: Lung bases: Partially visualized pulmonary metastatic disease with the reference  lesion in the right lower lobe measuring 3.4 cm however, some of the other nodules in the visualized lower lungs have increased in size. Overall, pulmonary  findings are better evaluated on the chest CT from earlier this afternoon (this  demonstrated progression). Liver: Widespread hepatic metastases, not significantly changed from prior with the reference lesion in the left hepatic lobe measuring 5.3 cm (previously 5.2 cm when measured in a similar fashion). Gallbladder: No calcified gallstones. Pericholecystic fluid which may be related  to underlying hepatic pathology as the gallbladder is not abnormally distended. Spleen: Normal. Pancreas: No evidence of measurable lesion and no ductal dilatation. Adrenal glands: No evidence of adrenal gland metastases although there are lesions closely adjacent to the left adrenal gland with the reference lesion measuring 2.5 cm (previously 2.3 cm). Kidneys: Renal collecting systems opacified by processed IV contrast. No hydrocephalus. Symmetric nephrograms. Bowel: Abnormal soft tissue thickening in the region of the GE junction and gastric cardia, correlating with prior findings and not appreciably changed. No evidence of bowel obstruction. Moderately large stool burden suggesting constipation. The appendix is noninflamed. Free fluid: Small volume pelvic free fluid with findings concerning for peritonitis as the peritoneal lining is thickened and enhancing. There is also a  trace amount of perihepatic fluid and fluid along the left paracolic gutter where there are also findings concerning for mild peritoneal thickening. Findings may indicate carcinomatosis. Free air: None. Lymphadenopathy: Multiple pathologically enlarged upper abdominal and  retroperitoneal lymph nodes. A reference lesion adjacent to the left adrenal gland measures 2.5 cm (previously 2.3 cm) and a lesion along the posterior margin of the IVC measures 2.1 cm (previously 2.2 cm). Pelvic  organs: No abnormal filling defect in the opacified portion of the bladder. Prostamegaly with mild impression on the base the bladder. Bones: No concerning osseous lesion. Prior left L4-5 transpedicular fixation with interbody fixation at L4-5 and L5-S1. Osseous union at these levels as well  as L2-3. There is likely significant canal stenosis at L3-4 and L2-3. Vasculature: Portal vein remains patent. Unchanged flattening of the intrahepatic portion of the IVC. Body wall: Anasarca.     1.  Redemonstrated soft tissue lesion at the GE junction and gastric cardia, not  significantly changed. Redemonstrated metastatic disease with no significant progression in hepatic disease burden or upper abdominal lymphadenopathy. Partially visualized metastases in the lung bases, some of which have increased in size (more completely evaluated on the chest CT from earlier today). 2.  Small volume abdominopelvic free fluid with findings concerning for peritoneal thickening and enhancement. 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 3.  Moderately large stool burden, consistent with constipation. 4.  Degenerative and postoperative changes of the lumbar spine, as above.    CT CHEST W CONTRAST    Result Date: 12/29/2022  CT CHEST WITH IV CONTRAST:   12/29/22 INDICATION: metastatic gastric cancer   COMPARISON: Chest CT 12/16/2022, PET 10/31/2022, CTA chest 10/25/2022 TECHNIQUE: Images were obtained through the chest with IV contrast without adverse reaction.  CT scanning was performed using radiation dose reduction techniques when appropriate, per system protocol. FINDINGS: Significant increase in size and number of previously noted pulmonary metastases. Mass in  the left posterior costophrenic sulcus that previously measured 22 x 20 mm now measures about 39 x 31 mm. Previously noted nodule on the right that measured 24 mm in maximum dimension now measures about 36 mm. Trace right pleural fluid. Right IJ port. No thyroid nodule. No enlarged nodes. Moderate coronary artery calcification. No cardiomegaly or pericardial effusion. Largest lesion in liver included on the study is in the right lobe and measures about 86 mm, previously measuring about 80 mm on 12/16/2022 exam. Large gastric cardia mass again seen similar to 12/16/2022. Extensive adenopathy is seen in the upper abdomen, only partially included on the study, similar to 12/16/2022 CT. No bone lesion appreciated.     Significant worsening of pulmonary metastatic disease. Little change or some worsening of hepatic metastases from 12/16/2022. No change appreciated in extensive adenopathy in the upper abdomen or mass in the gastric cardia.      Assessment /Plan   1.  SIRS associated with stage IV gastric carcinoma-concern with lactic acidemia, hypotension, neutrophilic leukocytosis at 30.5 K on presentation -CT of chest abdomen pelvis performed with no recognized pulmonary embolus or appreciable pulmonary infiltrate with patient's concern for worsening dyspnea on exertion.  By abdominopelvic CT imaging has concern for peritoneal thickening concerning for early peritonitis.  Will continue empiric Zosyn and add vancomycin for SIRS pending blood cultures.  Continue volume resuscitation with limited p.o. intake as lactic acidemia likely also associated with volume depletion.  2.Stage IV gastric carcinoma squamous -oncology evaluation requested for further recommendations given extensive metastatic disease.  3.  OSA-CPAP intolerant.  4. Calf discomfort- had previous lower extremity provoked DVT cervical fusion.  Will repeat this of lower extremity to assess.  Hold anticoagulation in interim given extensive metastatic disease and risk  for uncontrolled hemorrhage.    Prophylaxis: SCDs  Code Status: Full    Electronically signed by Audie Clear, MD on 12/29/22 at 8:09 PM EDT

## 2022-12-30 ENCOUNTER — Inpatient Hospital Stay: Admit: 2022-12-30 | Discharge: 2023-01-13 | Payer: MEDICARE | Primary: Internal Medicine

## 2022-12-30 LAB — LACTIC ACID
Lactic Acid: 4.6 mmol/L (ref 0.5–2.0)
Lactic Acid: 3.8 mmol/L (ref 0.5–2.0)

## 2022-12-30 LAB — HEMOGLOBIN: Hemoglobin: 6.8 g/dL — CL (ref 13.0–17.3)

## 2022-12-30 LAB — COMPREHENSIVE METABOLIC PANEL W/ REFLEX TO MG FOR LOW K
ALT: 20 U/L (ref 0–50)
AST: 123 U/L — ABNORMAL HIGH (ref 0–50)
Albumin/Globulin Ratio: 0.5 — ABNORMAL LOW (ref 1.00–2.70)
Albumin: 1.7 g/dL — ABNORMAL LOW (ref 3.5–5.2)
Alk Phosphatase: 514 U/L — ABNORMAL HIGH (ref 40–130)
Anion Gap: 13 mmol/L (ref 2–17)
BUN: 15 mg/dL (ref 8–23)
CALCIUM,CORRECTED,CCA: 9.1 mg/dL (ref 8.5–10.7)
CO2: 18 mmol/L — ABNORMAL LOW (ref 22–29)
Calcium: 7.3 mg/dL — ABNORMAL LOW (ref 8.5–10.7)
Chloride: 99 mmol/L (ref 98–107)
Creatinine: 0.5 mg/dL — ABNORMAL LOW (ref 0.7–1.3)
Est, Glom Filt Rate: 115 mL/min/1.73m?? (ref >=60)
Globulin: 3.5 g/dL (ref 1.9–4.4)
Glucose: 125 mg/dL — ABNORMAL HIGH (ref 70–99)
Osmolaliy Calculated: 263 mOsm/kg — ABNORMAL LOW (ref 270–287)
Potassium: 4.4 mmol/L (ref 3.5–5.3)
Sodium: 130 mmol/L — ABNORMAL LOW (ref 135–145)
Total Bilirubin: 0.48 mg/dL (ref 0.00–1.20)
Total Protein: 5.2 g/dL — ABNORMAL LOW (ref 5.7–8.3)

## 2022-12-30 LAB — MICROSCOPIC URINALYSIS: MUCUS, URINE: NONE SEEN /LPF

## 2022-12-30 LAB — CBC WITH AUTO DIFFERENTIAL
Basophils %: 0.3 % (ref 0.0–2.0)
Basophils Absolute: 0.1 10*3/uL (ref 0.0–0.2)
Eosinophils %: 0.2 % (ref 0.0–7.0)
Eosinophils Absolute: 0 10*3/uL (ref 0.0–0.5)
Hematocrit: 23.5 % — ABNORMAL LOW (ref 38.0–52.0)
Hemoglobin: 6.8 g/dL — CL (ref 13.0–17.3)
Immature Grans (Abs): 0.9 10*3/uL — ABNORMAL HIGH (ref 0.00–0.06)
Immature Granulocytes %: 4.1 % — ABNORMAL HIGH (ref 0.0–0.6)
Lymphocytes Absolute: 1.3 10*3/uL (ref 1.0–3.2)
Lymphocytes: 5.9 % — ABNORMAL LOW (ref 15.0–45.0)
MCH: 22.7 pg — ABNORMAL LOW (ref 27.0–34.5)
MCHC: 28.9 g/dL — ABNORMAL LOW (ref 30.0–36.0)
MCV: 78.6 fL — ABNORMAL LOW (ref 84.0–100.0)
MPV: 9.9 fL (ref 7.0–12.2)
Monocytes %: 13.3 % — ABNORMAL HIGH (ref 4.0–12.0)
Monocytes Absolute: 2.9 10*3/uL — ABNORMAL HIGH (ref 0.3–1.0)
NRBC Absolute: 0.02 10*3/uL — ABNORMAL HIGH (ref 0.000–0.012)
NRBC Automated: 0.1 % (ref 0.0–0.2)
Neutrophils %: 76.2 % — ABNORMAL HIGH (ref 42.0–74.0)
Neutrophils Absolute: 16.8 10*3/uL — ABNORMAL HIGH (ref 1.6–7.3)
Platelets: 303 10*3/uL (ref 140–440)
RBC: 2.99 x10e6/mcL — ABNORMAL LOW (ref 4.00–5.60)
RDW: 20.7 % — ABNORMAL HIGH (ref 10.0–17.0)
WBC: 22 10*3/uL — ABNORMAL HIGH (ref 3.8–10.6)

## 2022-12-30 LAB — URINALYSIS W/ RFLX MICROSCOPIC
Bilirubin, Urine: NEGATIVE
Blood, Urine: NEGATIVE
Glucose, Ur: NEGATIVE
Ketones, Urine: NEGATIVE mg/dL
Leukocyte Esterase, Urine: NEGATIVE
Nitrite, Urine: NEGATIVE
Protein, UA: 30 — AB
Specific Gravity, UA: 1.044 (ref 1.003–1.035)
Urobilinogen, Urine: 1 EU/dL (ref >1.0)
pH, Urine: 5.5 (ref 4.5–8.0)

## 2022-12-30 LAB — IRON AND TIBC
Iron % Saturation: 15 % — ABNORMAL LOW (ref 20–40)
Iron: 21 ug/dL — ABNORMAL LOW (ref 59–158)
TIBC: 138 ug/dL — ABNORMAL LOW (ref 250–450)
UIBC: 117 ug/dL (ref 112.0–347.0)

## 2022-12-30 LAB — ANTIBODY SCREEN: Antibody Screen: NEGATIVE

## 2022-12-30 LAB — PREPARE RBC (CROSSMATCH)
Blood Bank ISBT Product Blood Type: 6200
Expiration Date: 202408112359

## 2022-12-30 LAB — PROTIME-INR
INR: 1.3 — ABNORMAL LOW (ref 1.5–3.5)
Protime: 15.4 seconds — ABNORMAL HIGH (ref 11.6–14.5)

## 2022-12-30 LAB — LACTATE, SEPSIS
Lactate, Sepsis: 3.7 mmol/L (ref 0.5–2.0)
Lactate, Sepsis: 4 mmol/L (ref 0.5–2.0)

## 2022-12-30 LAB — HAPTOGLOBIN: Haptoglobin: 345 mg/dL — ABNORMAL HIGH (ref 30.0–200.0)

## 2022-12-30 LAB — MRSA BY PCR: MRSA SCREEN RT-PCR: NOT DETECTED

## 2022-12-30 LAB — FERRITIN: Ferritin: 2372 ng/mL — ABNORMAL HIGH (ref 30.0–400.0)

## 2022-12-30 LAB — ABO/RH: ABO/Rh: A POS

## 2022-12-30 LAB — D-DIMER, QUANTITATIVE: D-Dimer, Quant: >20 CD:295178345 — ABNORMAL HIGH (ref 0.19–0.50)

## 2022-12-30 LAB — LACTATE DEHYDROGENASE: LD: 1822 U/L — ABNORMAL HIGH (ref 135–225)

## 2022-12-30 LAB — PTT: APTT: 36 seconds — ABNORMAL HIGH (ref 23.3–34.5)

## 2022-12-30 LAB — VAS DUP LOWER EXTREMITY VENOUS BILATERAL: Body Surface Area: 1.79 m2

## 2022-12-30 MED ORDER — DEXTROSE-SODIUM CHLORIDE 5-0.45 % IV SOLN
INTRAVENOUS | Status: DC
Start: 2022-12-30 — End: 2022-12-30
  Administered 2022-12-30: 01:00:00 via INTRAVENOUS

## 2022-12-30 MED ORDER — MORPHINE SULFATE (PF) 4 MG/ML IJ SOLN
4 | INTRAMUSCULAR | Status: DC | PRN
Start: 2022-12-30 — End: 2022-12-30
  Administered 2022-12-30 (×3): 2 mg via INTRAVENOUS

## 2022-12-30 MED ORDER — POLYETHYLENE GLYCOL 3350 17 G PO PACK
17 | Freq: Every day | ORAL | Status: DC | PRN
Start: 2022-12-30 — End: 2023-01-02

## 2022-12-30 MED ORDER — POLYVINYL ALCOHOL-POVIDONE PF 1.4-0.6 % OP SOLN
OPHTHALMIC | Status: DC | PRN
Start: 2022-12-30 — End: 2023-01-02

## 2022-12-30 MED ORDER — LORAZEPAM 2 MG/ML IJ SOLN
2 MG/ML | Freq: Four times a day (QID) | INTRAMUSCULAR | Status: AC | PRN
Start: 2022-12-30 — End: 2023-01-02

## 2022-12-30 MED ORDER — OXYCODONE HCL ER 20 MG PO T12A
20 | Freq: Two times a day (BID) | ORAL | Status: DC
Start: 2022-12-30 — End: 2023-01-02
  Administered 2022-12-30 – 2023-01-02 (×8): 20 mg via ORAL

## 2022-12-30 MED ORDER — LACTATED RINGERS IV SOLN
INTRAVENOUS | Status: DC
Start: 2022-12-30 — End: 2022-12-31
  Administered 2022-12-30 – 2022-12-31 (×2): via INTRAVENOUS

## 2022-12-30 MED ORDER — SODIUM CHLORIDE 0.9 % IV SOLN
0.9 | INTRAVENOUS | Status: DC | PRN
Start: 2022-12-30 — End: 2023-01-02

## 2022-12-30 MED ORDER — POLYMYXIN B-TRIMETHOPRIM 10000-0.1 UNIT/ML-% OP SOLN
10000-0.1- UNIT/ML-% | Freq: Every day | OPHTHALMIC | Status: AC
Start: 2022-12-30 — End: 2023-01-02
  Administered 2022-12-30 – 2023-01-02 (×22): 1 [drp] via OPHTHALMIC

## 2022-12-30 MED ORDER — NA FERRIC GLUC CPLX IN SUCROSE 12.5 MG/ML IV SOLN
12.5 | Freq: Once | INTRAVENOUS | Status: AC
Start: 2022-12-30 — End: 2022-12-30
  Administered 2022-12-30: 16:00:00 125 mg via INTRAVENOUS

## 2022-12-30 MED ORDER — ACETAMINOPHEN 650 MG RE SUPP
650 | Freq: Four times a day (QID) | RECTAL | Status: DC | PRN
Start: 2022-12-30 — End: 2023-01-02

## 2022-12-30 MED ORDER — MORPHINE SULFATE 4 MG/ML IV SOLN
4 MG/ML | INTRAVENOUS | Status: AC | PRN
Start: 2022-12-30 — End: 2023-01-02
  Administered 2022-12-31 – 2023-01-02 (×7): 4 mg via INTRAVENOUS

## 2022-12-30 MED ORDER — SIMETHICONE 80 MG PO CHEW
80 | Freq: Four times a day (QID) | ORAL | Status: DC | PRN
Start: 2022-12-30 — End: 2023-01-02

## 2022-12-30 MED ORDER — ONDANSETRON 4 MG PO TBDP
4 | Freq: Three times a day (TID) | ORAL | Status: DC | PRN
Start: 2022-12-30 — End: 2023-01-02

## 2022-12-30 MED ORDER — OXYCODONE-ACETAMINOPHEN 7.5-325 MG PO TABS
Freq: Four times a day (QID) | ORAL | Status: DC | PRN
Start: 2022-12-30 — End: 2022-12-30
  Administered 2022-12-30 (×3): 1 via ORAL

## 2022-12-30 MED ORDER — SENNOSIDES 8.6 MG PO TABS
8.6 | Freq: Every evening | ORAL | Status: DC
Start: 2022-12-30 — End: 2022-12-31
  Administered 2022-12-31: 01:00:00 8.6 via ORAL

## 2022-12-30 MED ORDER — NORMAL SALINE FLUSH 0.9 % IV SOLN
0.9 | INTRAVENOUS | Status: DC | PRN
Start: 2022-12-30 — End: 2023-01-02
  Administered 2022-12-31 – 2023-01-02 (×3): 10 mL via INTRAVENOUS

## 2022-12-30 MED ORDER — VANCOMYCIN HCL 1 G IV SOLR
1 | Freq: Two times a day (BID) | INTRAVENOUS | Status: DC
Start: 2022-12-30 — End: 2022-12-31
  Administered 2022-12-30 – 2022-12-31 (×2): 1000 mg via INTRAVENOUS

## 2022-12-30 MED ORDER — POTASSIUM BICARB-CITRIC ACID 20 MEQ PO TBEF
20 MEQ | ORAL | Status: AC | PRN
Start: 2022-12-30 — End: 2023-01-02

## 2022-12-30 MED ORDER — MAGNESIUM SULFATE 2 GM/50ML IV SOLN
250 GM/50ML | INTRAVENOUS | Status: AC | PRN
Start: 2022-12-30 — End: 2023-01-02

## 2022-12-30 MED ORDER — POTASSIUM CHLORIDE 10 MEQ/100ML IV SOLN
10100 MEQ/0ML | INTRAVENOUS | Status: AC | PRN
Start: 2022-12-30 — End: 2023-01-02

## 2022-12-30 MED ORDER — SODIUM CHLORIDE 0.9 % IV BOLUS
0.9 | Freq: Once | INTRAVENOUS | Status: AC
Start: 2022-12-30 — End: 2022-12-30
  Administered 2022-12-30: 05:00:00 1000 mL via INTRAVENOUS

## 2022-12-30 MED ORDER — ONDANSETRON HCL 4 MG/2ML IJ SOLN
4 | Freq: Four times a day (QID) | INTRAMUSCULAR | Status: DC | PRN
Start: 2022-12-30 — End: 2023-01-02
  Administered 2022-12-30: 17:00:00 4 mg via INTRAVENOUS

## 2022-12-30 MED ORDER — OXYCODONE HCL ER 10 MG PO T12A
10 | Freq: Two times a day (BID) | ORAL | Status: DC
Start: 2022-12-30 — End: 2022-12-29

## 2022-12-30 MED ORDER — ACETAMINOPHEN 325 MG PO TABS
325 | Freq: Four times a day (QID) | ORAL | Status: DC | PRN
Start: 2022-12-30 — End: 2023-01-02

## 2022-12-30 MED ORDER — POLYETHYLENE GLYCOL 3350 17 G PO PACK
17 g | Freq: Two times a day (BID) | ORAL | Status: AC
Start: 2022-12-30 — End: 2023-01-02
  Administered 2022-12-30 – 2023-01-02 (×5): 17 g via ORAL

## 2022-12-30 MED ORDER — POTASSIUM CHLORIDE CRYS ER 20 MEQ PO TBCR
20 MEQ | ORAL | Status: AC | PRN
Start: 2022-12-30 — End: 2023-01-02

## 2022-12-30 MED ORDER — MAGIC MOUTHWASH
Freq: Four times a day (QID) | Status: DC | PRN
Start: 2022-12-30 — End: 2022-12-29

## 2022-12-30 MED ORDER — PANTOPRAZOLE SODIUM 40 MG PO TBEC
40 | Freq: Every day | ORAL | Status: DC
Start: 2022-12-30 — End: 2023-01-02
  Administered 2022-12-30 – 2023-01-02 (×4): 40 mg via ORAL

## 2022-12-30 MED ORDER — MORPHINE SULFATE 4 MG/ML IV SOLN
4 | INTRAVENOUS | Status: DC | PRN
Start: 2022-12-30 — End: 2022-12-30

## 2022-12-30 MED ORDER — MAGIC MOUTHWASH WITH NYSTATIN SIMPLE
Freq: Four times a day (QID) | Status: DC | PRN
Start: 2022-12-30 — End: 2023-01-02
  Administered 2022-12-30 – 2023-01-01 (×3): 15 mL via ORAL

## 2022-12-30 MED ORDER — SODIUM CHLORIDE 0.9 % IV BOLUS
0.9 | Freq: Once | INTRAVENOUS | Status: AC
Start: 2022-12-30 — End: 2022-12-30
  Administered 2022-12-30: 11:00:00 500 mL via INTRAVENOUS

## 2022-12-30 MED ORDER — NORMAL SALINE FLUSH 0.9 % IV SOLN
0.9 | Freq: Two times a day (BID) | INTRAVENOUS | Status: DC
Start: 2022-12-30 — End: 2023-01-02
  Administered 2022-12-30 – 2023-01-01 (×6): 10 mL via INTRAVENOUS
  Administered 2023-01-01: 20 mL via INTRAVENOUS
  Administered 2023-01-02: 13:00:00 10 mL via INTRAVENOUS

## 2022-12-30 MED ORDER — OXYCODONE-ACETAMINOPHEN 7.5-325 MG PO TABS
ORAL | Status: DC | PRN
Start: 2022-12-30 — End: 2022-12-31
  Administered 2022-12-30 – 2022-12-31 (×6): 1 via ORAL

## 2022-12-30 MED ORDER — SODIUM CHLORIDE 0.9 % IV SOLN (MINI-BAG)
0.9 | Freq: Three times a day (TID) | INTRAVENOUS | Status: DC
Start: 2022-12-30 — End: 2023-01-02
  Administered 2022-12-30 – 2023-01-02 (×11): 3375 mg via INTRAVENOUS

## 2022-12-30 MED ORDER — SODIUM CHLORIDE 0.9 % IV SOLN
0.9 | Freq: Once | INTRAVENOUS | Status: AC
Start: 2022-12-30 — End: 2022-12-29
  Administered 2022-12-30: 01:00:00 1500 mg via INTRAVENOUS

## 2022-12-30 MED ORDER — LUBRIFRESH P.M. OP OINT
OPHTHALMIC | Status: DC | PRN
Start: 2022-12-30 — End: 2022-12-29

## 2022-12-30 MED FILL — ONDANSETRON HCL 4 MG/2ML IJ SOLN: 4 MG/2ML | INTRAMUSCULAR | Qty: 2

## 2022-12-30 MED FILL — NA FERRIC GLUC CPLX IN SUCROSE 12.5 MG/ML IV SOLN: 12.5 MG/ML | INTRAVENOUS | Qty: 10

## 2022-12-30 MED FILL — MORPHINE SULFATE (PF) 4 MG/ML IJ SOLN: 4 mg/mL | INTRAMUSCULAR | Qty: 1

## 2022-12-30 MED FILL — NORMAL SALINE FLUSH 0.9 % IV SOLN: 0.9 % | INTRAVENOUS | Qty: 30

## 2022-12-30 MED FILL — PANTOPRAZOLE SODIUM 40 MG PO TBEC: 40 MG | ORAL | Qty: 1

## 2022-12-30 MED FILL — OXYCODONE-ACETAMINOPHEN 7.5-325 MG PO TABS: ORAL | Qty: 1

## 2022-12-30 MED FILL — NORMAL SALINE FLUSH 0.9 % IV SOLN: 0.9 % | INTRAVENOUS | Qty: 10

## 2022-12-30 MED FILL — MAGIC MOUTHWASH WITH NYSTATIN SIMPLE: Qty: 15

## 2022-12-30 MED FILL — NYSTATIN 100000 UNIT/ML MT SUSP: 100000 UNIT/ML | OROMUCOSAL | Qty: 1.88

## 2022-12-30 MED FILL — PIPERACILLIN SOD-TAZOBACTAM SO 3.375 (3-0.375) G IV SOLR: 3.375 (3-0.375) g | INTRAVENOUS | Qty: 3375

## 2022-12-30 MED FILL — VANCOMYCIN HCL 1.5 G IV SOLR: 1.5 g | INTRAVENOUS | Qty: 1500

## 2022-12-30 MED FILL — OXYCONTIN 20 MG PO T12A: 20 MG | ORAL | Qty: 1

## 2022-12-30 MED FILL — POLYETHYLENE GLYCOL 3350 17 G PO PACK: 17 g | ORAL | Qty: 1

## 2022-12-30 MED FILL — POLYMYXIN B-TRIMETHOPRIM 10000-0.1 UNIT/ML-% OP SOLN: OPHTHALMIC | Qty: 10

## 2022-12-30 MED FILL — VANCOMYCIN HCL 1 G IV SOLR: 1 g | INTRAVENOUS | Qty: 1000

## 2022-12-30 NOTE — Care Coordination-Inpatient (Signed)
12/30/22 1519   IMM Letter   IMM Letter given to Patient/Family/Significant other/Guardian/POA/by: BPaulson BSN RN   IMM Letter date given: 12/30/22   IMM Letter time given: 1519     Placed in chart.

## 2022-12-30 NOTE — Consults (Signed)
Palliative Medicine Consultation      Reason for Consult:    Goals of Care    Requesting Physician:  Odis Luster  Primary Care Provider: Fulton Mole, MD     CHIEF COMPLAINT:    Chief Complaint   Patient presents with    Hypotension    Fatigue    Fever    Shortness of Breath     Pt currently has metastatic stomach cancer. Has been having low BP, fatigue, fever, and intermittent SOB.         HISTORY OF PRESENT ILLNESS:    61yoM with recently diagnosed metastatic gastric cancer and OSA not on CPAP who presented with weakness, SOB and calf pain and was found to have sepsis with lactic acidemia and concern for peritonitis.     Palliative care team was consulted for goals of care.    Chart reviewed extensively.  Patient has no previous palliative care consults or advance care planning documents in the chart.    Reviewed oncology history. Pt was admitted for back surgery 04/2021. No other admissions or ED visits until 10/2022 when he presented with weight loss, abdominal pain and fevers and was found to have gastric, pulmonary and hepatic masses. Underwent EGD with biopsy which revealed stage IV gastric squamous cell carcinoma. He was started on FOLFOX 6/3, nivolumab added 6/17. Repeat scans 7/9 unfortunately demonstrating progression of neoplasm at all pre-existing sites.     D/w Dr. Blase Mess.    D/w RN at bedside.  Pt is oriented but he is pretty lethargic. He will close his eyes and drift off mid-sentence at times.    He reports >10y back pain for which he has been on disability for 10y. 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. On his third marriage, but has been married to his current wife for 24y. He has one son -- another son died a few years ago of a heart attack. He has one granddaughter and is planning to take her on a camping trip in October in his newly purchased truck.     Pt has been on percocet 7.5/325 4x/day for 10y for his back pain. He was started on oxycontin for back and abdominal pain after he was  diagnosed in May. He was increased to 20mg  recently. His pain has been controlled on the oxycontin plus his chronic percocet. Some constipation, has not tried senna but is open to this. Some nausea today for which he is getting zofran but had previously not had nausea.     Pt seems to have appropriate diagnostic and prognostic awareness. He wishes me to come back when his wife is here tomorrow. He will call me to set up a time.    D/w RN.              Past Medical History:        Diagnosis Date    Back pain     Lumbar disc disease     OSA (obstructive sleep apnea)     Doesnt use machine    Sciatica         Past Surgical History:        Procedure Laterality Date    BACK SURGERY  06/2010    L5-S1 REMOVAL OF SCREWS    CERVICAL SPINE SURGERY  08/26/2019    C5-C6 ACDF    FACIAL SURGERY  1981    HERNIA REPAIR  2002    IR PORT PLACEMENT EQUAL OR GREATER THAN  5 YEARS  10/31/2022    IR PORT PLACEMENT EQUAL OR GREATER THAN 5 YEARS 10/31/2022 Tana Conch, MD [facility]    LUMBAR FUSION  04/22/2011    L4-L5 PLIF USING DePUY SPACER, BMP, AUTOGRAFT, LATERAL FUSION USING SYNTHES SCREWS    THUMB ARTHROSCOPY  1995    TONSILLECTOMY AND ADENOIDECTOMY  1972        Home Medications:    Prior to Admission medications    Medication Sig Start Date End Date Taking? Authorizing Provider   oxyCODONE (OXYCONTIN) 20 MG extended release tablet Take 1 tablet by mouth 2 times daily for 30 days. Intended supply: 30 days Max Daily Amount: 40 mg 12/26/22 01/25/23  Yvonne Kendall, APRN - NP   simethicone (MYLICON) 80 MG chewable tablet Take 1 tablet by mouth 4 times daily as needed (bloating, flatulence)  Patient not taking: Reported on 12/29/2022 12/26/22   Yvonne Kendall, APRN - NP   omeprazole (PRILOSEC) 40 MG delayed release capsule Take 1 capsule by mouth daily 12/26/22   Yvonne Kendall, APRN - NP   Handicap Placard MISC by Does not apply route 12/22/22   Yvonne Kendall, APRN - NP   oxyCODONE-acetaminophen (PERCOCET) 7.5-325 MG per tablet  Take 1 tablet by mouth every 6 hours as needed for Pain (abdominal pain) for up to 14 days. Max Daily Amount: 4 tablets 12/15/22 12/29/22  Yvonne Kendall, APRN - NP   oxyCODONE (OXYCONTIN) 10 MG extended release tablet Take 1 tablet by mouth 2 times daily for 30 days. Intended supply: 30 days Max Daily Amount: 20 mg 11/24/22 12/24/22  Evalyn Casco., MD   Magic Mouthwash (MIRACLE MOUTHWASH) Swish and spit 5 mLs 4 times daily as needed for Irritation Diphenhyddramine: nystatin: lidocaine: maalox 11/17/22   Dollinger, Stefan Church, APRN - NP   OLANZapine (ZYPREXA) 5 MG tablet Take 1 tablet by mouth nightly  Patient not taking: Reported on 12/29/2022 11/17/22   Dollinger, Stefan Church, APRN - NP   Simethicone (GAS-X PO) Take by mouth 2 times daily    [provider]   BISACODYL PO Take by mouth    [provider]   DOCUSATE SODIUM PO Take by mouth 2 times daily    [provider]   ondansetron (ZOFRAN-ODT) 8 MG TBDP disintegrating tablet Take 1 tablet by mouth every 8 hours as needed for Nausea or Vomiting (nausea, vomiting) 11/06/22   Yvonne Kendall, APRN - NP   promethazine (PHENERGAN) 25 MG tablet Take 1 tablet by mouth every 6 hours as needed for Nausea  Patient taking differently: Take 1 tablet by mouth every 6 hours as needed for Nausea Was just prescribed it, but does not like how it "knocks him out". Pt said he is willing to take at night. 11/06/22   Yvonne Kendall, APRN - NP   Polyethylene Glycol 3350 (MIRALAX PO) Take 17 g by mouth 2 times daily 04/11/21   [provider]   Naproxen Sodium (ALEVE) 220 MG CAPS Take 1 tablet by mouth 2 times daily    [provider]        Allergies:    Codeine, Dilaudid [hydromorphone hcl], Duloxetine hcl, Gabapentin, Hydrocodone, Pregabalin, and Tramadol hcl    Social History:    Tobacco:   reports that he has never smoked. He has never used smokeless tobacco.  Alcohol:   reports that he does not currently use  alcohol.  Drug:   Social History  Substance and Sexual Activity   Drug Use Never        Family History:       Problem Relation Age of Onset    Heart Failure Father     Diabetes Father     Cancer Father         Bladder Cancer    Diabetes Paternal Grandmother     Heart Failure Paternal Grandfather         Vitals:    BP 110/72   Pulse (!) 119   Temp 97.7 F (36.5 C) (Oral)   Resp 18   Ht 1.753 m (5\' 9" )   Wt 65.8 kg (145 lb)   SpO2 98%   BMI 21.41 kg/m     PHYSICAL EXAM:    General: oriented but somnolent  Eye: PERRL, EOMI  HEENT: dry oral mucosa, no scleral icterus, + bilateral temporal wasting noted  Neck: full ROM  Lungs: non-labored respiration on RA, equal chest rise bilaterally  Neurologic: no seizure or myoclonus  Psychiatric: normal mood and affect  Skin: no rashes or lesions    DATA:    CBC:   Recent Labs     12/29/22  1607 12/29/22  1751 12/30/22  0631   WBC 29.9* 30.5* 22.0*   HGB 8.1* 9.0* 6.8*   PLT 387 434 303     Last 3 CMP:   Recent Labs     12/29/22  1607 12/29/22  1751 12/30/22  0631   NA 129* 130* 130*   K 4.5 4.7 4.4   CL 98 91* 99   CO2 23 21* 18*   BUN 16 17 15    CREATININE 0.8 0.7 0.5*   GLUCOSE 98 103* 125*   CALCIUM 8.2* 8.7 7.3*   BILITOT 0.81 0.59 0.48   ALKPHOS 736* 692* 514*   AST 139* 159* 123*   ALT 25 29 20         Imaging:    CT ABDOMEN PELVIS W IV CONTRAST Additional Contrast? None    Result Date: 12/29/2022  1.  Redemonstrated soft tissue lesion at the GE junction and gastric cardia, not  significantly changed. Redemonstrated metastatic disease with no significant progression in hepatic disease burden or upper abdominal lymphadenopathy. Partially visualized metastases in the lung bases, some of which have increased in size (more completely evaluated on the chest CT from earlier today). 2.  Small volume abdominopelvic free fluid with findings concerning for peritoneal thickening and enhancement. In the setting of prior metastatic disease, findings are most suspicious for  developing peritoneal carcinomatosis. 3.  Moderately large stool burden, consistent with constipation. 4.  Degenerative and postoperative changes of the lumbar spine, as above.    CT CHEST W CONTRAST    Result Date: 12/29/2022  Significant worsening of pulmonary metastatic disease. Little change or some worsening of hepatic metastases from 12/16/2022. No change appreciated in extensive adenopathy in the upper abdomen or mass in the gastric cardia.         Assessment/ Plan  1. Peritoneal carcinomatosis (HCC)    2. Septicemia (HCC)    3. Lactic acidemia    4. Right calf pain       61yoM with recently diagnosed metastatic gastric cancer and OSA not on CPAP who presented with weakness, SOB and calf pain and was found to have sepsis with lactic acidemia and concern for peritonitis.     Palliative care team was consulted for goals of care.    Would continue his chronic  opioids. Added senna for constipation. Will return tomorrow when wife is here    Very guarded long-term prognosis    Goals of care: to address more with pt and wife tomorrow  Surrogate medical decision-maker: presumed wife  Code status: DNR after discussion with hospitalist  Advance directive: none on file    Amedeo Kinsman, MD  Palliative Care    Plan of care regarding patient's condition and preferences was discussed with the interdisciplinary team members to include: RN, MD    I assessed understanding of illness, reviewed information and answered questions.

## 2022-12-30 NOTE — Consults (Signed)
Pharmacy Progress Note    SUBJECTIVE     REASON FOR CONSULT: Dosing and monitoring of vancomycin.  INDICATION: sepsis unknown origin  Treatment day: 2  ------------------------------------------------------------------------------------------------------------------  OBJECTIVE   Admission weight: 65.8 kg (145 lb)   Ideal body weight: 70.7 kg (155 lb 13.8 oz)     Past Medical History:   Diagnosis Date    Back pain     Lumbar disc disease     OSA (obstructive sleep apnea)     Doesnt use machine    Sciatica        Pertinent Cultures:   MRSA PCR - ordered  Urine - pending  Blood - pending  -------------------------------------------------------------------------------------------------------------------  ASSESSMENT / PLAN     Jesse Savage  is a 62 y.o. male receiving vancomycin for the treatment of sepsis.    Current drug regimen: New start - got 1500mg  x1 last night  Recent levels: no recent levels  Renal function:  Current Scr 0.5 mg/dL  Estimated Creatinine Clearance: 143 mL/min (A) (based on SCr of 0.5 mg/dL (L)). Stable but anticipate CrCl lower than estimated d/t low Scr and patient history of gastric cancer  Goal levels: AUC 400-600, trough >10 mcg/mL  New drug regimen: vancomycin 1000 mg every 12 hours. Predicted AUC 421.  F/U monitoring and levels: A Trough Level is ordered for 7/25 @ 0500. A clinical pharmacist will continue to monitor drug levels and renal function, and make dose adjustments as appropriate.  Expected duration of therapy: to be determined by clinical course..    Thank you for allowing me to participate in the care of this patient. Please contact your Clinical Pharmacist with any questions or concerns.    Consuela Mimes, PharmD, BCCCP  Pharmacist

## 2022-12-30 NOTE — Care Coordination-Inpatient (Signed)
12/29/22:  BMP  CM Note.  IA/IM completed.  Admit:  fever, weakness, history gastric cancer.  IADLs, with 'some' assistance on a prn basis from spouse.  Pt agrees CM to follow for d/c planning.   12/30/22 1514   Service Assessment   Patient Orientation Alert and Oriented;Person;Place;Situation   Cognition Alert   History Provided By Patient   Primary Caregiver Self   Support Systems Spouse/Significant Other;Family Members   PCP Verified by CM Yes  (Dr Eduardo Osier)   Last Visit to PCP Within last 6 months   Prior Functional Level Assistance with the following:;Cooking;Mobility  (transportation)   Current Functional Level   (as above)   Can patient return to prior living arrangement Yes   Would you like for me to discuss the discharge plan with any other family members/significant others, and if so, who? Yes  (spouse:  Ms Grantham    [phone])   Financial Resources [employer] Resources None   Social/Functional History   Lives With Spouse   Type of Home House   Home Layout One level   Home Access Stairs to enter with rails   Entrance Stairs - Number of Steps 3   Entrance Stairs - Rails Left   Bathroom Shower/Tub Tub/Shower unit   Management consultant - 4-Wheeled   Receives Help From Family   ADL Assistance Independent   Homemaking Assistance Independent   Ambulation Assistance Independent   Transfer Assistance Independent   Active Driver No   Patient's Driver Info spouse asssits   Mode of Engineer, water   Occupation Retired   Type of Occupation Pipe/steam fitter

## 2022-12-30 NOTE — Consults (Signed)
Reason for Consult:    Metastatic gastric cancer    Consultation Requested by:    Dr. Janalyn Rouse    HISTORY OF PRESENT ILLNESS:    Jesse Savage is a very kind 62 y.o. male well known to Dr. Phineas Douglas and I and is currently undergoing treatment for stage IV gastric squamous cell carcinoma.  He presented to our clinic yesterday as a sick visit.  He was experiencing worsening low-grade fevers.  His white count was elevated at 29.9 with an ANC of 23.8.  He had not received growth stimulating factor.  In our clinic, it was noted that he was hypotensive and tachycardic.  He was also noting worsening shortness of breath.  He was evaluated by my colleague and Dr. Evalyn Casco.  He was encouraged to present to the [facility] downtown emergency department.  In the emergency department, he was found to be hypotensive.  He was noted to have lactic acidemia which improved after volume resuscitation and the initiation of Zosyn.  CT of the chest, abdomen and pelvis did not recognize any pulmonary emboli or appreciable pulmonary infiltrates to attribute to the patient's worsening dyspnea on exertion. It did show significant worsening pulmonary metastatic disease. There was no significant progression of hepatic disease or upper abdominal lymphadenopathy. There was small volume abdominopelvic free fluid concerning for peritoneal thickening and enhancement.  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  Current findings are concerning for peritonitis.  Blood culture x 1 are pending.  This morning, the patient states he is still feeling poorly but is feeling better than yesterday.  He states that yesterday he was fearful he was going to die.  He states that he has not slept well.  He is very hesitant to receive blood products today as he wants to be assured the donor has not had the COVID-vaccine.  He has been seen by bloodless medicine at the time of our interview.    Oncology  History  Metastatic gastric cancer, squamous cell differentiation-PD-L1 positive, HER2 positive  Originally presenting to the emergency department with a 20 pound weight loss, abdominal discomfort, and low grade fevers  CT abdomen/pelvis 10/25/22: There is a gastric mass at the gastric fundus measuring approximately 4.2 x 4.0 cm in size with multiple abnormal hepatic masses, multiple abnormally enlarged   lymph nodes, and multiple abnormal pulmonary nodules likely representing   extensive metastatic disease.  CTA chest 10/25/22: No evidence of acute pulmonary embolism as clinically queried. No pneumonia. No pleural effusion. No pneumothorax. Multiple bilateral pulmonary nodules with multiple hepatic lesions most likely representing extensive metastatic disease.  5/22  PET/CT 10/31/22: Intensely hypermetabolic gastric cardia mass with associated regional   hypermetabolic lymphadenopathy and more distant, fairly extensive pulmonary and   hepatic metastases.  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, squamous cell  10/31/22 IR Port placement  11/10/22 cycle 1 day 1 FOLFOX  11/24/22 added Nivolumab   12/16/22 CT abdomen/pelvis: Progression of neoplasm at all pre-existing sites, multiple coiling lesions in the lung bases, dominant left-sided lesion 3.3 cm previously 2.4 cm, extensive intrahepatic metastatic burden also increased, representative lesion 5 cm previously 4 cm, increased size of bulky low-density upper abdominal adenopathy including periaortic distribution, prominent gastric cardia mass increased in prominence    Past Medical History:     has a past medical history of Back pain, Lumbar disc disease, OSA (obstructive sleep apnea), and Sciatica.  Past Surgical History:    Past Surgical History:   Procedure Laterality Date    BACK SURGERY  06/2010    L5-S1 REMOVAL OF SCREWS    CERVICAL SPINE SURGERY  08/26/2019    C5-C6 ACDF    FACIAL  SURGERY  1981    HERNIA REPAIR  2002    IR PORT PLACEMENT EQUAL OR GREATER THAN 5 YEARS  10/31/2022    IR PORT PLACEMENT EQUAL OR GREATER THAN 5 YEARS 10/31/2022 Tana Conch, MD [facility]    LUMBAR FUSION  04/22/2011    L4-L5 PLIF USING DePUY SPACER, BMP, AUTOGRAFT, LATERAL FUSION USING SYNTHES SCREWS    THUMB ARTHROSCOPY  1995    TONSILLECTOMY AND ADENOIDECTOMY  1972      Current Medications:     sodium chloride  500 mL IntraVENous Once    sodium chloride flush  5-40 mL IntraVENous 2 times per day    piperacillin-tazobactam  3,375 mg IntraVENous Q8H    pantoprazole  40 mg Oral QAM AC    oxyCODONE  20 mg Oral BID    polyethylene glycol  17 g Oral BID    trimethoprim-polymyxin b  1 drop Left Eye 6x Daily     Allergies:    Allergies   Allergen Reactions    Codeine      Other reaction(s): itch  Other reaction(s): NA    Dilaudid [Hydromorphone Hcl]      Other reaction(s): malaise    Duloxetine Hcl      Other reaction(s): hypertension  Other reaction(s): NA    Gabapentin      Other reaction(s): sleepy  Other reaction(s): dizzy  Other reaction(s): NA    Hydrocodone      Other reaction(s): itch    Pregabalin      Other reaction(s): neurotic episodes    Tramadol Hcl      Other reaction(s): malaise      Social History:    reports that he has never smoked. He has never used smokeless tobacco. He reports that he does not currently use alcohol. He reports that he does not use drugs.   Family History:     family history includes Cancer in his father; Diabetes in his father and paternal grandmother; Heart Failure in his father and paternal grandfather.     REVIEW OF SYSTEMS:    A comprehensive 12 system review of systems is negative with the exception of pertinent findings above.      PHYSICAL EXAM:    Vitals:    Vitals:    12/30/22 0722   BP: 99/65   Pulse: (!) 106   Resp: 16   Temp: 98 F (36.7 C)   SpO2: 98%      Constitutional: NAD. Acute on chronically ill appearing male sitting up in recliner; temporal wasting  present  Eyes:  Conjunctiva normal; eyelids normal; PERRLA; sclera anicteric  Ear, Nose, Throat:  External ears and nose normal; hearing grossly normal; poor dentition  Neck:  Supple; no masses; no adenopathy  Respiratory: CTAB, symmetric chest rise  Cardiovascular:  Normal heart sounds; regular rate and rhythm; 2+ bilateral lower extremity edema, left greater than right   Abdomen: Abdomen distended, hypoactive bowel sounds,tenderness upon palpation throughout   Skin:  No rash; no petechiae; no ecchymoses; pallor; no cutaneous nodules;  Musculoskeletal:  Generalized weakness  Neurologic:  No focal motor or sensory deficit; gait not assessed; no abnormal mental status  Psychiatric:  Oriented to person time and place; mood and  affect appropriate to situation; appropriate judgment and insight; memory intact  Access: Right upper chest port    LABORATORY DATA:  Recent Results (from the past 24 hour(s))   Comprehensive Metabolic Panel    Collection Time: 12/29/22  4:07 PM   Result Value Ref Range    Sodium 129 (L) 135 - 145 mmol/L    Potassium 4.5 3.5 - 5.3 mmol/L    Chloride 98 98 - 107 mmol/L    CO2 23 22 - 29 mmol/L    Glucose 98 70 - 99 mg/dL    BUN 16 8 - 23 mg/dL    Creatinine 0.8 0.7 - 1.0 mg/dL    Anion Gap 8 2 - 17 mmol/L    Calcium 8.2 (L) 8.8 - 10.2 mg/dL    Total Protein 6.1 (L) 6.4 - 8.3 g/dL    Albumin 2.8 (L) 3.5 - 5.2 g/dL    Globulin 3.3 1.9 - 4.4 g/dL    Albumin/Globulin Ratio 0.8 (L) 1.0 - 2.7    Total Bilirubin 0.81 0.00 - 1.20 mg/dL    Alk Phosphatase 469 (H) 40 - 130 unit/L    AST 139 (H) 0 - 50 unit/L    ALT 25 0 - 50 unit/L    Est, Glom Filt Rate 102 >=60 mL/min/1.43m   CBC With Auto Differential    Collection Time: 12/29/22  4:07 PM   Result Value Ref Range    WBC 29.9 (H) 3.8 - 10.6 x10e3/mcL    RBC 3.56 (L) 4.00 - 5.60 x10e3/mcL    Hemoglobin 8.1 (L) 13.0 - 17.3 g/dL    Hematocrit 62.9 (L) 38.0 - 52.0 %    MCV 78.9 (L) 84.0 - 100.0 fL    MCH 22.8 (L) 27.0 - 34.5 pg    MCHC 28.8 (L) 30.0 - 36.0  g/dL    RDW 52.8 (H) 41.3 - 17.0 %    Platelets 387 140 - 440 x10e3/mcL    MPV 9.4 7.0 - 12.2 fL    NRBC Automated 0.1 0.0 - 0.2 %    NRBC Absolute 0.020 (H) 0.000 - 0.012 mOsm/kg    Neutrophils % 79.5 (H) 42.0 - 74.0 %    Lymphocytes 4.4 (L) 15.0 - 45.0 %    Monocytes % 10.9 4.0 - 12.0 x10e3/mcL    Eosinophils % 0 0 - 7 %    Basophils % 0 0 - 2 %    Neutrophils Absolute 23.8 (H) 1.6 - 7.3 x10e3/mcL    Lymphocytes Absolute 1.3 1.0 - 3.2 x10e3/mcL    Monocytes Absolute 3.2 (H) 0.3 - 1.0 x10e3/mcL    Eosinophils Absolute 0.0 0.0 - 0.5 x10e3/mcL    Basophils Absolute 0 0 - 2 x10e3/mcL    Immature Granulocytes % 4.8 (H) 0.0 - 0.6 %    Immature Grans (Abs) 1.42 (H) 0.00 - 0.06 x10e3/mcL   EKG 12 Lead    Collection Time: 12/29/22  5:40 PM   Result Value Ref Range    Ventricular Rate 121 BPM    P-R Interval 128 ms    QRS Duration 84 ms    Q-T Interval 296 ms    QTc Calculation (Bazett) 368 ms    P Axis 62 degrees    R Axis 17 degrees    T Axis 57 degrees    Diagnosis       SINUS TACHYCARDIA  LOW QRS VOLTAGE [QRS DEFLECTION < 0.5/1.0 MV IN LIMB/CHEST LEADS]  ABNORMAL RHYTHM ECG  CBC with Auto Differential    Collection Time: 12/29/22  5:51 PM   Result Value Ref Range    WBC 30.5 (H) 3.8 - 10.6 x10e3/mcL    RBC 3.94 (L) 4.00 - 5.60 x10e6/mcL    Hemoglobin 9.0 (L) 13.0 - 17.3 g/dL    Hematocrit 09.8 (L) 38.0 - 52.0 %    MCV 78.2 (L) 84.0 - 100.0 fL    MCH 22.8 (L) 27.0 - 34.5 pg    MCHC 29.2 (L) 30.0 - 36.0 g/dL    RDW 11.9 (H) 14.7 - 17.0 %    Platelets 434 140 - 440 x10e3/mcL    MPV 9.0 7.0 - 12.2 fL    NRBC Automated 0.2 0.0 - 0.2 %    NRBC Absolute 0.060 (H) 0.000 - 0.012 x10e3/mcL    Neutrophils % 78.3 (H) 42.0 - 74.0 %    Lymphocytes 4.7 (L) 15.0 - 45.0 %    Monocytes % 11.3 4.0 - 12.0 %    Eosinophils % 0.1 0.0 - 7.0 %    Basophils % 0.3 0.0 - 2.0 %    Neutrophils Absolute 23.9 (H) 1.6 - 7.3 x10e3/mcL    Lymphocytes Absolute 1.4 1.0 - 3.2 x10e3/mcL    Monocytes Absolute 3.5 (H) 0.3 - 1.0 x10e3/mcL    Eosinophils  Absolute 0.0 0.0 - 0.5 x10e3/mcL    Basophils Absolute 0.1 0.0 - 0.2 x10e3/mcL    Immature Granulocytes % 5.3 (H) 0.0 - 0.6 %    Immature Grans (Abs) 1.61 (H) 0.00 - 0.06 x10e3/mcL   Comprehensive Metabolic Panel    Collection Time: 12/29/22  5:51 PM   Result Value Ref Range    Sodium 130 (L) 135 - 145 mmol/L    Potassium 4.7 3.5 - 5.3 mmol/L    Chloride 91 (L) 98 - 107 mmol/L    CO2 21 (L) 22 - 29 mmol/L    Glucose 103 (H) 70 - 99 mg/dL    BUN 17 8 - 23 mg/dL    Creatinine 0.7 0.7 - 1.3 mg/dL    Anion Gap 18 (H) 2 - 17 mmol/L    Osmolaliy Calculated 262 (L) 270 - 287 mOsm/kg    Calcium 8.7 8.5 - 10.7 mg/dL    CALCIUM,CORRECTED,CCA 10.1 8.5 - 10.7 mg/dL    Total Protein 6.7 5.7 - 8.3 g/dL    Albumin 2.3 (L) 3.5 - 5.2 g/dL    Globulin 4.4 1.9 - 4.4 g/dL    Albumin/Globulin Ratio 0.50 (L) 1.00 - 2.70    Total Bilirubin 0.59 0.00 - 1.20 mg/dL    Alk Phosphatase 829 (H) 40 - 130 unit/L    AST 159 (H) 0 - 50 unit/L    ALT 29 0 - 50 unit/L    Est, Glom Filt Rate 104 >=60 mL/min/1.31m   Lactate, Sepsis    Collection Time: 12/29/22  5:51 PM   Result Value Ref Range    Lactate, Sepsis 6.3 (HH) 0.5 - 2.0 mmol/L   Manual Differential    Collection Time: 12/29/22  5:51 PM   Result Value Ref Range    Neutrophils %. Manual count 84 (H) 42 - 74 %    Bands Relative 4 0 - 5 %    Lymphocytes 3 (L) 15 - 45 %    Monocytes % 7 4 - 12 %    Basophils % 1 0 - 2 %    Myelocyte Percent 1 %    Neutrophils Absolute 25.6 (H) 1.6 -  7.3 x10e3/mcL    Absolute Bands 1.2 x10e3/mcL    Lymphocytes Absolute 0.9 (L) 1.0 - 3.2 x10e3/mcL    Monocytes Absolute 2.1 (H) 0.3 - 1.0 x10e3/mcL    Basophils Absolute 0.3 (H) 0.0 - 0.2 x10e3/mcL    Myelocytes Absolute 0.3 x10e3/mcL    Platelet Estimate Adequate     RBC Morphology Abnormal (A) Normal    Polychromasia Few (A)     Ovalocytes Few (A)     Target Cells Few (A)    Procalcitonin    Collection Time: 12/29/22  5:51 PM   Result Value Ref Range    Procalcitonin 0.65 (H) <=0.24 ng/mL   Lactate, Sepsis     Collection Time: 12/29/22  7:45 PM   Result Value Ref Range    Lactate, Sepsis 4.1 (HH) 0.5 - 2.0 mmol/L   Lactate, Sepsis    Collection Time: 12/29/22  9:58 PM   Result Value Ref Range    Lactate, Sepsis 3.7 (HH) 0.5 - 2.0 mmol/L   Lactate, Sepsis    Collection Time: 12/30/22 12:04 AM   Result Value Ref Range    Lactate, Sepsis 4.0 (HH) 0.5 - 2.0 mmol/L   D-Dimer, Quantitative    Collection Time: 12/30/22 12:04 AM   Result Value Ref Range    D-Dimer, Quant >20.00 (H) 0.19 - 0.50 [id number]   CBC with Auto Differential    Collection Time: 12/30/22  6:31 AM   Result Value Ref Range    WBC 22.0 (H) 3.8 - 10.6 x10e3/mcL    RBC 2.99 (L) 4.00 - 5.60 x10e6/mcL    Hemoglobin 6.8 (LL) 13.0 - 17.3 g/dL    Hematocrit 66.4 (L) 38.0 - 52.0 %    MCV 78.6 (L) 84.0 - 100.0 fL    MCH 22.7 (L) 27.0 - 34.5 pg    MCHC 28.9 (L) 30.0 - 36.0 g/dL    RDW 40.3 (H) 47.4 - 17.0 %    Platelets 303 140 - 440 x10e3/mcL    MPV 9.9 7.0 - 12.2 fL    NRBC Automated 0.1 0.0 - 0.2 %    NRBC Absolute 0.020 (H) 0.000 - 0.012 x10e3/mcL    Neutrophils % 76.2 (H) 42.0 - 74.0 %    Lymphocytes 5.9 (L) 15.0 - 45.0 %    Monocytes % 13.3 (H) 4.0 - 12.0 %    Eosinophils % 0.2 0.0 - 7.0 %    Basophils % 0.3 0.0 - 2.0 %    Neutrophils Absolute 16.8 (H) 1.6 - 7.3 x10e3/mcL    Lymphocytes Absolute 1.3 1.0 - 3.2 x10e3/mcL    Monocytes Absolute 2.9 (H) 0.3 - 1.0 x10e3/mcL    Eosinophils Absolute 0.0 0.0 - 0.5 x10e3/mcL    Basophils Absolute 0.1 0.0 - 0.2 x10e3/mcL    Immature Granulocytes % 4.1 (H) 0.0 - 0.6 %    Immature Grans (Abs) 0.90 (H) 0.00 - 0.06 x10e3/mcL   Comprehensive Metabolic Panel w/ Reflex to MG    Collection Time: 12/30/22  6:31 AM   Result Value Ref Range    Sodium 130 (L) 135 - 145 mmol/L    Potassium 4.4 3.5 - 5.3 mmol/L    Chloride 99 98 - 107 mmol/L    CO2 18 (L) 22 - 29 mmol/L    Glucose 125 (H) 70 - 99 mg/dL    BUN 15 8 - 23 mg/dL    Creatinine 0.5 (L) 0.7 - 1.3 mg/dL    Anion Gap 13 2 - 17 mmol/L  Osmolaliy Calculated 263 (L) 270 - 287  mOsm/kg    Calcium 7.3 (L) 8.5 - 10.7 mg/dL    CALCIUM,CORRECTED,CCA 9.1 8.5 - 10.7 mg/dL    Total Protein 5.2 (L) 5.7 - 8.3 g/dL    Albumin 1.7 (L) 3.5 - 5.2 g/dL    Globulin 3.5 1.9 - 4.4 g/dL    Albumin/Globulin Ratio 0.50 (L) 1.00 - 2.70    Total Bilirubin 0.48 0.00 - 1.20 mg/dL    Alk Phosphatase 712 (H) 40 - 130 unit/L    AST 123 (H) 0 - 50 unit/L    ALT 20 0 - 50 unit/L    Est, Glom Filt Rate 115 >=60 mL/min/1.60m   Lactic Acid    Collection Time: 12/30/22  6:31 AM   Result Value Ref Range    Lactic Acid 4.6 (HH) 0.5 - 2.0 mmol/L        ASSESSMENT/PLAN:    Stage IV gastric squamous cell cancer  Unfortunately, recent CT imaging shows progression of disease following 3 cycles of FOLFOX + nivolumab.  Upon discharge from the hospital, we will discuss second line therapy which may consist of Herceptin therapy given that he is HER2 positive.  All further treatment will be in the outpatient setting.    Peritonitis  Yesterday, CT abdomen pelvis was concerning for peritoneal thickening concerning for peritonitis.  He continues on empiric Zosyn as well as vancomycin.  C. difficile studies were fortunately negative  Blood cultures are pending  Continue volume resuscitation per primary team's recommendations    Anemia most likely chemotherapy and infection induced  Transfuse for hemoglobin less than 7  In the past in the outpatient setting, we have undergone a thorough workup for microcytic anemia.  Iron studies were not indicative of iron deficiency.  Additional workup included B12, folate and TSH which were all remarkable.  Previously, a peripheral flow cytometry was obtained which confirmed an absolute monocytosis which could represent an underlying myeloproliferative disorder.  However, the case was reviewed with Dr. Evalyn Casco, and there was no indication for treatment at this given time.  Will continue to monitor closely.      Stefan Church Charls Custer, APRN - NP    Elements of this note have been dictated  using speech recognition software. As a result, errors of speech recognition may have occurred.

## 2022-12-30 NOTE — Progress Notes (Signed)
Hospitalist Daily Progress Note    Subjective:   Tolerating antibiotics.  Worsening lactic acidosis and drop in hemoglobin.  He denies any history of GI bleeds or recent melena, however evidence of red or blood clots in stool.  Continued dyspnea without any cough.  Hemoglobin dropped to 6.8.  We discussed risks and benefits of blood transfusions.  He reports his primary aversion is concerned that he would get mRNA from donor he was COVID vaccinated.  We discussed multiple different life or death scenarios at which time he reports he would likely proceed with a blood transfusion if it would be to save his life.  He plans to discuss this further with his wife.  We also discussed things such as his advanced cancer and cardiopulmonary arrest.  He agrees with being a DO NOT RESUSCITATE.  Currently remains treatment focused.  Asked multiple times if he is at the point where he needs to consider palliative or hospice treatments.    Medications:   Scheduled Meds:   sodium chloride flush  5-40 mL IntraVENous 2 times per day    piperacillin-tazobactam  3,375 mg IntraVENous Q8H    pantoprazole  40 mg Oral QAM AC    oxyCODONE  20 mg Oral BID    polyethylene glycol  17 g Oral BID    trimethoprim-polymyxin b  1 drop Left Eye 6x Daily     Continuous Infusions:   lactated ringers IV soln 125 mL/hr at 12/30/22 0756    sodium chloride       PRN Meds:morphine, sodium chloride flush, sodium chloride, potassium chloride **OR** potassium alternative oral replacement **OR** potassium chloride, magnesium sulfate, ondansetron **OR** ondansetron, polyethylene glycol, acetaminophen **OR** acetaminophen, oxyCODONE-acetaminophen, simethicone, magic (miracle) mouthwash with nystatin ([facility] only), LORazepam, Polyvinyl Alcohol-Povidone PF      Objective:    BP 99/65   Pulse (!) 106   Temp 98 F (36.7 C) (Oral)   Resp 18   Ht 1.753 m (5\' 9" )   Wt 65.8 kg (145 lb)   SpO2 98%   BMI 21.41 kg/m     Physical Exam:  General: alert and oriented x  3, calm, comfortable appearing  HEENT: moist mucus membranes, normocephalic  Cardiac: Tachycardic, regular, no clicks, rubs, or murmurs   Pulmonary: clear to auscultation bilaterally, no respiratory distress  GI: soft, non-tender, no rebound or guarding, distended, active bowel sounds  MSK: no joint tenderness or swelling, no edema   Skin: no rashes or lesion, dry and warm   Neuro: no confusion, strength symmetric bilaterally, no numbness in extremities     Cardiographics  No results found for this or any previous visit.      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   No evidence of acute deep vein thrombosis in the right lower extremity.   No evidence of acute deep vein thrombosis in the left lower extremity. The right small saphenous vein was partially compressible and contained hyperechoic intraluminal material consistent with superficial vein thrombophlebitis.     CT ABDOMEN PELVIS W IV CONTRAST Additional Contrast? None    Result Date: 12/29/2022  CT abdomen pelvis with contrast: 12/29/22 INDICATION: "abd pain worsening". History of metastatic gastric cancer. COMPARISON: CT 12/16/2022 TECHNIQUE: Routine protocol (Axial portal venous phase imaging from the lung bases to the pubic symphysis following IV contrast with coronal reconstruction.)  CT scanning was performed using radiation dose  reduction techniques when appropriate, per system protocols. FINDINGS: Lung bases: Partially visualized pulmonary metastatic disease with the reference  lesion in the right lower lobe measuring 3.4 cm however, some of the other nodules in the visualized lower lungs have increased in size. Overall, pulmonary  findings are better evaluated on the chest CT from earlier this afternoon (this  demonstrated progression). Liver: Widespread hepatic metastases, not significantly changed from prior with the reference lesion in  the left hepatic lobe measuring 5.3 cm (previously 5.2 cm when measured in a similar fashion). Gallbladder: No calcified gallstones. Pericholecystic fluid which may be related  to underlying hepatic pathology as the gallbladder is not abnormally distended. Spleen: Normal. Pancreas: No evidence of measurable lesion and no ductal dilatation. Adrenal glands: No evidence of adrenal gland metastases although there are lesions closely adjacent to the left adrenal gland with the reference lesion measuring 2.5 cm (previously 2.3 cm). Kidneys: Renal collecting systems opacified by processed IV contrast. No hydrocephalus. Symmetric nephrograms. Bowel: Abnormal soft tissue thickening in the region of the GE junction and gastric cardia, correlating with prior findings and not appreciably changed. No evidence of bowel obstruction. Moderately large stool burden suggesting constipation. The appendix is noninflamed. Free fluid: Small volume pelvic free fluid with findings concerning for peritonitis as the peritoneal lining is thickened and enhancing. There is also a  trace amount of perihepatic fluid and fluid along the left paracolic gutter where there are also findings concerning for mild peritoneal thickening. Findings may indicate carcinomatosis. Free air: None. Lymphadenopathy: Multiple pathologically enlarged upper abdominal and retroperitoneal lymph nodes. A reference lesion adjacent to the left adrenal gland measures 2.5 cm (previously 2.3 cm) and a lesion along the posterior margin of the IVC measures 2.1 cm (previously 2.2 cm). Pelvic organs: No abnormal filling defect in the opacified portion of the bladder. Prostamegaly with mild impression on the base the bladder. Bones: No concerning osseous lesion. Prior left L4-5 transpedicular fixation with interbody fixation at L4-5 and L5-S1. Osseous union at these levels as well  as L2-3. There is likely significant canal stenosis at L3-4 and L2-3. Vasculature: Portal vein  remains patent. Unchanged flattening of the intrahepatic portion of the IVC. Body wall: Anasarca.     1.  Redemonstrated soft tissue lesion at the GE junction and gastric cardia, not  significantly changed. Redemonstrated metastatic disease with no significant progression in hepatic disease burden or upper abdominal lymphadenopathy. Partially visualized metastases in the lung bases, some of which have increased in size (more completely evaluated on the chest CT from earlier today). 2.  Small volume abdominopelvic free fluid with findings concerning for peritoneal thickening and enhancement. 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 3.  Moderately large stool burden, consistent with constipation. 4.  Degenerative and postoperative changes of the lumbar spine, as above.    CT CHEST W CONTRAST    Result Date: 12/29/2022  CT CHEST WITH IV CONTRAST:   12/29/22 INDICATION: metastatic gastric cancer   COMPARISON: Chest CT 12/16/2022, PET 10/31/2022, CTA chest 10/25/2022 TECHNIQUE: Images were obtained through the chest with IV contrast without adverse reaction.  CT scanning was performed using radiation dose reduction techniques when appropriate, per system protocol. FINDINGS: Significant increase in size and number of previously noted pulmonary metastases. Mass in the left posterior costophrenic sulcus that previously measured 22 x 20 mm now measures about 39 x 31 mm. Previously noted nodule on the right that measured 24 mm in maximum  dimension now measures about 36 mm. Trace right pleural fluid. Right IJ port. No thyroid nodule. No enlarged nodes. Moderate coronary artery calcification. No cardiomegaly or pericardial effusion. Largest lesion in liver included on the study is in the right lobe and measures about 86 mm, previously measuring about 80 mm on 12/16/2022 exam. Large gastric cardia mass again seen similar to 12/16/2022. Extensive adenopathy is seen  in the upper abdomen, only partially included on the study, similar to 12/16/2022 CT. No bone lesion appreciated.     Significant worsening of pulmonary metastatic disease. Little change or some worsening of hepatic metastases from 12/16/2022. No change appreciated in extensive adenopathy in the upper abdomen or mass in the gastric cardia.      Lab Review   Lab Results   Component Value Date    WBC 22.0 (H) 12/30/2022    HGB 6.8 (LL) 12/30/2022    HCT 23.5 (L) 12/30/2022    MCV 78.6 (L) 12/30/2022    PLT 303 12/30/2022     Lab Results   Component Value Date    NA 130 (L) 12/30/2022    K 4.4 12/30/2022    CL 99 12/30/2022    CO2 18 (L) 12/30/2022    BUN 15 12/30/2022    CREATININE 0.5 (L) 12/30/2022    GLUCOSE 125 (H) 12/30/2022    CALCIUM 7.3 (L) 12/30/2022    BILITOT 0.48 12/30/2022    ALKPHOS 514 (H) 12/30/2022    AST 123 (H) 12/30/2022    ALT 20 12/30/2022    LABGLOM 115 12/30/2022    GFRAA 113 09/05/2019    GLOB 3.5 12/30/2022       Assessment/Plan   Principal Problem:    Stomach cancer (HCC)  Active Problems:    SIRS (systemic inflammatory response syndrome) (HCC)  Resolved Problems:    * No resolved hospital problems. *    1.  Sepsis present on admission associated with stage IV gastric carcinoma-concern with lactic acidemia, hypotension, neutrophilic leukocytosis at 30.5 K on presentation -CT of chest abdomen pelvis performed with no recognized pulmonary embolus or appreciable pulmonary infiltrate with patient's concern for worsening dyspnea on exertion.  -By abdominopelvic CT imaging has concern for peritoneal thickening concerning for early peritonitis.  -Will continue empiric Zosyn and vancomycin.  Blood culture and urinalysis and culture pending  -Worsening lactic acidosis, continue IV fluids changed to LR from half-normal saline    2.Stage IV gastric carcinoma squamous   -oncology evaluation requested for further recommendations given extensive metastatic disease.  -Palliative consulted    3.  OSA  -CPAP  intolerant.    4. Calf discomfort  - had previous lower extremity provoked DVT.  DVT study with superficial thrombophlebitis and right small saphenous vein, negative for DVT    5.  Anemia, mixed IDA and chronic disease  -Has been reliant upon iron and EPO injections per patient as he is refused blood transfusions.  We discussed giving an additional iron infusion this morning.  He did say that in life or death situations he would want to likely proceed with blood transfusions however he plans to discuss this further with his wife and then will follow-up with palliative care and oncology on further discussions today.  -Hemoglobin 6.8.  Check type and screen as he may be agreeable for blood transfusion.  No evidence of acute blood loss      Spent 15 minutes in total and advance care planning discussing patient's chronic illnesses comorbidities, goals regarding resuscitation.  Diet: ADULT DIET; Regular  Dvt prophy: SCDs  DNR  Dispo: continued inpatient admission for sepsis and anemia  Candie Chroman, MD

## 2022-12-30 NOTE — Other (Signed)
Informed Consent for Blood Component Transfusion Note    I have discussed with the patient the rationale for blood component transfusion; its benefits in treating or preventing fatigue, organ damage, or death; and its risk which includes mild transfusion reactions, rare risk of blood borne infection, or more serious but rare reactions. I have discussed the alternatives to transfusion, including the risk and consequences of not receiving transfusion. The patient had an opportunity to ask questions and had agreed to proceed with transfusion of blood components.    Electronically signed by Candie Chroman, MD on 12/30/22 at 2:24 PM EDT

## 2022-12-31 ENCOUNTER — Inpatient Hospital Stay: Admit: 2022-12-31 | Discharge: 2023-01-04 | Payer: MEDICARE | Primary: Internal Medicine

## 2022-12-31 ENCOUNTER — Ambulatory Visit: Payer: MEDICARE | Primary: Internal Medicine

## 2022-12-31 DIAGNOSIS — C786 Secondary malignant neoplasm of retroperitoneum and peritoneum: Secondary | ICD-10-CM

## 2022-12-31 LAB — COMPREHENSIVE METABOLIC PANEL
ALT: 21 U/L (ref 0–50)
AST: 142 U/L — ABNORMAL HIGH (ref 0–50)
Albumin/Globulin Ratio: 0.5 — ABNORMAL LOW (ref 1.00–2.70)
Albumin: 1.8 g/dL — ABNORMAL LOW (ref 3.5–5.2)
Alk Phosphatase: 592 U/L — ABNORMAL HIGH (ref 40–130)
Anion Gap: 13 mmol/L (ref 2–17)
BUN: 10 mg/dL (ref 8–23)
CALCIUM,CORRECTED,CCA: 9.3 mg/dL (ref 8.5–10.7)
CO2: 20 mmol/L — ABNORMAL LOW (ref 22–29)
Calcium: 7.5 mg/dL — ABNORMAL LOW (ref 8.5–10.7)
Chloride: 98 mmol/L (ref 98–107)
Creatinine: 0.5 mg/dL — ABNORMAL LOW (ref 0.7–1.3)
Est, Glom Filt Rate: 115 mL/min/1.73m?? (ref >=60)
Globulin: 3.7 g/dL (ref 1.9–4.4)
Glucose: 73 mg/dL (ref 70–99)
Osmolaliy Calculated: 260 mOsm/kg — ABNORMAL LOW (ref 270–287)
Potassium: 4.3 mmol/L (ref 3.5–5.3)
Sodium: 131 mmol/L — ABNORMAL LOW (ref 135–145)
Total Bilirubin: 0.82 mg/dL (ref 0.00–1.20)
Total Protein: 5.5 g/dL — ABNORMAL LOW (ref 5.7–8.3)

## 2022-12-31 LAB — CULTURE, URINE: FINAL REPORT: NO GROWTH

## 2022-12-31 LAB — CBC WITH AUTO DIFFERENTIAL
Basophils %: 0.3 % (ref 0.0–2.0)
Basophils Absolute: 0.1 10*3/uL (ref 0.0–0.2)
Eosinophils %: 0.4 % (ref 0.0–7.0)
Eosinophils Absolute: 0.1 10*3/uL (ref 0.0–0.5)
Hematocrit: 28.2 % — ABNORMAL LOW (ref 38.0–52.0)
Hemoglobin: 8.1 g/dL — ABNORMAL LOW (ref 13.0–17.3)
Immature Grans (Abs): 0.81 10*3/uL — ABNORMAL HIGH (ref 0.00–0.06)
Immature Granulocytes %: 3.7 % — ABNORMAL HIGH (ref 0.0–0.6)
Lymphocytes Absolute: 1.6 10*3/uL (ref 1.0–3.2)
Lymphocytes: 7.1 % — ABNORMAL LOW (ref 15.0–45.0)
MCH: 23.3 pg — ABNORMAL LOW (ref 27.0–34.5)
MCHC: 28.7 g/dL — ABNORMAL LOW (ref 30.0–36.0)
MCV: 81.3 fL — ABNORMAL LOW (ref 84.0–100.0)
MPV: 9.1 fL (ref 7.0–12.2)
Monocytes %: 11.8 % (ref 4.0–12.0)
Monocytes Absolute: 2.6 10*3/uL — ABNORMAL HIGH (ref 0.3–1.0)
NRBC Absolute: 0 10*3/uL (ref 0.000–0.012)
NRBC Automated: 0 % (ref 0.0–0.2)
Neutrophils %: 76.7 % — ABNORMAL HIGH (ref 42.0–74.0)
Neutrophils Absolute: 16.7 10*3/uL — ABNORMAL HIGH (ref 1.6–7.3)
Platelets: 295 10*3/uL (ref 140–440)
RBC: 3.47 x10e6/mcL — ABNORMAL LOW (ref 4.00–5.60)
RDW: 20.4 % — ABNORMAL HIGH (ref 10.0–17.0)
WBC: 21.7 10*3/uL — ABNORMAL HIGH (ref 3.8–10.6)

## 2022-12-31 LAB — HEMOGLOBIN AND HEMATOCRIT
Hematocrit: 29 % — ABNORMAL LOW (ref 38.0–52.0)
Hemoglobin: 8.6 g/dL — ABNORMAL LOW (ref 13.0–17.3)

## 2022-12-31 LAB — LACTIC ACID
Lactic Acid: 2.8 mmol/L — ABNORMAL HIGH (ref 0.5–2.0)
Lactic Acid: 4 mmol/L (ref 0.5–2.0)

## 2022-12-31 LAB — CULTURE, BLOOD 1

## 2022-12-31 MED ORDER — OXYCODONE HCL 5 MG PO TABS
5 | ORAL | Status: DC | PRN
Start: 2022-12-31 — End: 2023-01-02
  Administered 2022-12-31 – 2023-01-02 (×6): 10 mg via ORAL

## 2022-12-31 MED ORDER — SENNOSIDES 8.6 MG PO TABS
8.6 MG | Freq: Two times a day (BID) | ORAL | Status: AC
Start: 2022-12-31 — End: 2023-01-02
  Administered 2023-01-01 – 2023-01-02 (×4): 17.2 via ORAL

## 2022-12-31 MED ORDER — SENNOSIDES 8.6 MG PO TABS
8.6 | Freq: Two times a day (BID) | ORAL | Status: DC
Start: 2022-12-31 — End: 2022-12-31

## 2022-12-31 MED ORDER — DOCUSATE SODIUM 100 MG PO CAPS
100 MG | Freq: Two times a day (BID) | ORAL | Status: AC
Start: 2022-12-31 — End: 2023-01-02
  Administered 2023-01-01 – 2023-01-02 (×2): 100 mg via ORAL

## 2022-12-31 MED FILL — POLYETHYLENE GLYCOL 3350 17 G PO PACK: 17 g | ORAL | Qty: 1

## 2022-12-31 MED FILL — OXYCODONE-ACETAMINOPHEN 7.5-325 MG PO TABS: ORAL | Qty: 1

## 2022-12-31 MED FILL — OXYCONTIN 20 MG PO T12A: 20 MG | ORAL | Qty: 1

## 2022-12-31 MED FILL — OXYCODONE HCL 5 MG PO TABS: 5 MG | ORAL | Qty: 2

## 2022-12-31 MED FILL — PANTOPRAZOLE SODIUM 40 MG PO TBEC: 40 MG | ORAL | Qty: 1

## 2022-12-31 MED FILL — NORMAL SALINE FLUSH 0.9 % IV SOLN: 0.9 % | INTRAVENOUS | Qty: 10

## 2022-12-31 MED FILL — MORPHINE SULFATE (PF) 4 MG/ML IJ SOLN: 4 mg/mL | INTRAMUSCULAR | Qty: 1

## 2022-12-31 MED FILL — PIPERACILLIN SOD-TAZOBACTAM SO 3.375 (3-0.375) G IV SOLR: 3.375 (3-0.375) g | INTRAVENOUS | Qty: 3375

## 2022-12-31 MED FILL — SENOKOT 8.6 MG PO TABS: 8.6 MG | ORAL | Qty: 1

## 2022-12-31 MED FILL — NORMAL SALINE FLUSH 0.9 % IV SOLN: 0.9 % | INTRAVENOUS | Qty: 40

## 2022-12-31 MED FILL — VANCOMYCIN HCL 1 G IV SOLR: 1 g | INTRAVENOUS | Qty: 1000

## 2022-12-31 NOTE — Progress Notes (Signed)
Subjective  Jesse Savage was seen and examined today.  He does thankfully report some improvement in his abdominal pain with IV pain medication, his abdomen is still tender throughout that he does have complaint of right intrascapular pain this morning.  He was eventually agreeable to receiving 1 unit PRBCs yesterday.  He denies any increased dyspnea today. Appetite remains labile.  He continues to endorse malaise.  He has had no fever over the past 24 hours although he feels febrile himself continues to endorse overnight.  He denies any nausea or vomiting.  A comprehensive review of systems is otherwise negative.      ONCOLOGY HISTORY    Metastatic gastric cancer, squamous cell differentiation-PD-L1 positive, HER2 positive  Originally presenting to the emergency department with a 20 pound weight loss, abdominal discomfort, and low grade fevers  CT abdomen/pelvis 10/25/22: There is a gastric mass at the gastric fundus measuring approximately 4.2 x 4.0 cm in size with multiple abnormal hepatic masses, multiple abnormally enlarged   lymph nodes, and multiple abnormal pulmonary nodules likely representing   extensive metastatic disease.  CTA chest 10/25/22: No evidence of acute pulmonary embolism as clinically queried. No pneumonia. No pleural effusion. No pneumothorax. Multiple bilateral pulmonary nodules with multiple hepatic lesions most likely representing extensive metastatic disease.  5/22  PET/CT 10/31/22: Intensely hypermetabolic gastric cardia mass with associated regional   hypermetabolic lymphadenopathy and more distant, fairly extensive pulmonary and   hepatic metastases.  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, squamous cell  10/31/22 IR Port placement  11/10/22 cycle 1 day 1 FOLFOX  11/24/22 added Nivolumab   12/16/22 CT abdomen/pelvis: Progression of neoplasm at all pre-existing sites, multiple coiling lesions in the lung  bases, dominant left-sided lesion 3.3 cm previously 2.4 cm, extensive intrahepatic metastatic burden also increased, representative lesion 5 cm previously 4 cm, increased size of bulky low-density upper abdominal adenopathy including periaortic distribution, prominent gastric cardia mass increased in prominence    Objective      Vitals & Measurements  VITALS:  BP 111/74   Pulse (!) 118   Temp 97.4 F (36.3 C) (Oral)   Resp 18   Ht 1.753 m (5\' 9" )   Wt 65.8 kg (145 lb)   SpO2 93%   BMI 21.41 kg/m     24HR INTAKE/OUTPUT:    Intake/Output Summary (Last 24 hours) at 12/31/2022 1327  Last data filed at 12/31/2022 0911  Gross per 24 hour   Intake 350 ml   Output 900 ml   Net -550 ml       Physical Exam  Constitutional: NAD. Acute on chronically ill appearing male sitting up in recliner; temporal wasting present  Eyes:  Conjunctiva normal; eyelids normal; PERRLA; sclera anicteric  Ear, Nose, Throat:  External ears and nose normal; hearing grossly normal; poor dentition  Neck:  Supple; no masses; no adenopathy  Respiratory: CTAB, symmetric chest rise  Cardiovascular:  Normal heart sounds; regular rate and rhythm; 2+ bilateral lower extremity edema, left greater than right   Abdomen: Abdomen distended, hypoactive bowel sounds,tenderness upon palpation throughout   Skin:  No rash; no petechiae; no ecchymoses; pallor; no cutaneous nodules;  Musculoskeletal:  Generalized weakness, right intrascapular pain  Neurologic:  No focal motor or sensory deficit; gait not assessed; no abnormal mental status  Psychiatric:  Oriented to person time and place; mood and affect appropriate to situation; appropriate judgment and insight; memory intact  Access:  Right upper chest port    Lab Results  Recent Results (from the past 24 hour(s))   Lactic Acid    Collection Time: 12/30/22  2:00 PM   Result Value Ref Range    Lactic Acid 3.8 (HH) 0.5 - 2.0 mmol/L   PREPARE RBC (CROSSMATCH), 1 Units    Collection Time: 12/30/22  4:35 PM   Result  Value Ref Range    Unit Number [id number]     Product Code Blood Bank [id number]     Expiration Date [id number]     Blood Bank ISBT Product Blood Type 6200     Dispense Status Blood Bank Issued    MRSA by PCR    Collection Time: 12/30/22  4:55 PM    Specimen: Nares   Result Value Ref Range    MRSA SCREEN RT-PCR Not Detected Negative   Hemoglobin and Hematocrit    Collection Time: 12/30/22  8:49 PM   Result Value Ref Range    Hemoglobin 8.6 (L) 13.0 - 17.3 g/dL    Hematocrit 56.2 (L) 38.0 - 52.0 %   Lactic Acid    Collection Time: 12/31/22  4:58 AM   Result Value Ref Range    Lactic Acid 2.8 (H) 0.5 - 2.0 mmol/L   CBC with Auto Differential    Collection Time: 12/31/22  4:58 AM   Result Value Ref Range    WBC 21.7 (H) 3.8 - 10.6 x10e3/mcL    RBC 3.47 (L) 4.00 - 5.60 x10e6/mcL    Hemoglobin 8.1 (L) 13.0 - 17.3 g/dL    Hematocrit 13.0 (L) 38.0 - 52.0 %    MCV 81.3 (L) 84.0 - 100.0 fL    MCH 23.3 (L) 27.0 - 34.5 pg    MCHC 28.7 (L) 30.0 - 36.0 g/dL    RDW 86.5 (H) 78.4 - 17.0 %    Platelets 295 140 - 440 x10e3/mcL    MPV 9.1 7.0 - 12.2 fL    NRBC Automated 0.0 0.0 - 0.2 %    NRBC Absolute 0.000 0.000 - 0.012 x10e3/mcL    Neutrophils % 76.7 (H) 42.0 - 74.0 %    Lymphocytes 7.1 (L) 15.0 - 45.0 %    Monocytes % 11.8 4.0 - 12.0 %    Eosinophils % 0.4 0.0 - 7.0 %    Basophils % 0.3 0.0 - 2.0 %    Neutrophils Absolute 16.7 (H) 1.6 - 7.3 x10e3/mcL    Lymphocytes Absolute 1.6 1.0 - 3.2 x10e3/mcL    Monocytes Absolute 2.6 (H) 0.3 - 1.0 x10e3/mcL    Eosinophils Absolute 0.1 0.0 - 0.5 x10e3/mcL    Basophils Absolute 0.1 0.0 - 0.2 x10e3/mcL    Immature Granulocytes % 3.7 (H) 0.0 - 0.6 %    Immature Grans (Abs) 0.81 (H) 0.00 - 0.06 x10e3/mcL   Comprehensive Metabolic Panel    Collection Time: 12/31/22  4:58 AM   Result Value Ref Range    Sodium 131 (L) 135 - 145 mmol/L    Potassium 4.3 3.5 - 5.3 mmol/L    Chloride 98 98 - 107 mmol/L    CO2 20 (L) 22 - 29 mmol/L    Glucose 73 70 - 99 mg/dL    BUN 10 8 - 23 mg/dL    Creatinine 0.5  (L) 0.7 - 1.3 mg/dL    Anion Gap 13 2 - 17 mmol/L    Osmolaliy Calculated 260 (L) 270 - 287 mOsm/kg    Calcium 7.5 (L) 8.5 - 10.7  mg/dL    CALCIUM,CORRECTED,CCA 9.3 8.5 - 10.7 mg/dL    Total Protein 5.5 (L) 5.7 - 8.3 g/dL    Albumin 1.8 (L) 3.5 - 5.2 g/dL    Globulin 3.7 1.9 - 4.4 g/dL    Albumin/Globulin Ratio 0.50 (L) 1.00 - 2.70    Total Bilirubin 0.82 0.00 - 1.20 mg/dL    Alk Phosphatase 818 (H) 40 - 130 unit/L    AST 142 (H) 0 - 50 unit/L    ALT 21 0 - 50 unit/L    Est, Glom Filt Rate 115 >=60 mL/min/1.29m   Lactic Acid    Collection Time: 12/31/22 12:25 PM   Result Value Ref Range    Lactic Acid 4.0 (HH) 0.5 - 2.0 mmol/L        Assessment and Plan  Stage IV gastric squamous cell cancer  Unfortunately, recent CT imaging shows progression of disease following 3 cycles of FOLFOX + nivolumab.    Upon discharge from the hospital, we will discuss second line therapy which may consist of Herceptin given that the disease is HER-2 positive.      Preantineoplastic cardiac screening  Prior to receiving salvage Herceptin, the patient will require an echocardiogram.  This is scheduled to be performed in the outpatient setting.  I will order this today to hopefully expedite this process.     Peritonitis  7/22 CT abdomen pelvis was concerning for peritoneal thickening concerning for peritonitis.  He continues on empiric Zosyn, vancomycin discontinued  C. difficile studies were fortunately negative  Blood and urine cultures pending  Continue pain management per primary team     Cytopenias attributable to Chemotherapy  Patient agreed to receive blood transfusion and was given 1 unit PRBC overnight  Transfuse 1 unit PRBC for Hgb <7  Transfuse 1 unit PLTS for Plt <10       Ida Rogue, ACNP    Attestation:  I interviewed and examined the patient on rounds.  I formulated the assessment and plan as outlined above.  I discussed the plan of care with the patient at bedside. He appears more comfortable today. Continue  comprehensive supportive care.     Evalyn Casco., M.D.     Elements of this note have been dictated using speech recognition software. As a result, errors of speech recognition may have occurred.

## 2022-12-31 NOTE — Progress Notes (Addendum)
Subjective  Pt required morphine 4mg  IV x2 and 2mg  IV x3 in the past 24h, in addition to his chronic meds of oxycontin 20mg  q12h and percocet 7.5/325 x4.     D/w RN.    Met with pt and his wife at bedside. Pt remains oriented, though he would often nod off mid-sentence. He and his wife report he has been sleepy like this for weeks now.    Pt reports ongoing pain in his back, tailbone, legs and abdomen. He has shortness of breath when he walks. He has no appetite. No energy. Declining performance status. Requiring more assistance with ADLs. Has lost 34lb in the past few months. Constipation. Denies n/v.    He has been on percocet 7.5/325mg  4x/day for many years. Oxycontin 10mg  was added shortly after his diagnosis, then increased to 20mg  several weeks ago. Since that time, he has had very frequent pain episodes, interrupted sleep.     Pt and wife are frustrated, as his pain seems uncontrolled on current regimen but he is also very sleepy.    We did a thorough review of his home meds. We identified that he has been taking olanzapine for appetite but it doesn't seem to be helping -- I shared that this could be contributing to his fatigue. He does not have any nausea. I told them I would discuss with oncology re: stopping that.    We discussed options for his pain regimen at great length. Ultimately opted to switch him from percocet to oxycodone 10mg  q4h prn, as we do not want to increase his daily tylenol use further. He will keep a count of how many oxycodones he is needing per day. If it is a lot, oncology may need to increase his long-acting oxycodone to 30mg  q12h at his next visit.    Pt takes bisacodyl daily for constipation but feels it gives him heartburn. I recommended switching to senna, up to 4 tabs twice daily as needed, for constipation as this has the best efficacy for opioid-related constipation.    For his energy, other than stopping the olanzapine, I recommended discussing ritalin with his oncologist at  his next visit. Not usually something we do when someone is acutely ill in the hospital, but could be considered as an outpatient.    I answered many questions about the above issues.    We discussed his goals. Pt and wife understand the cancer is advancing despite the treatment. They are hoping to continue with oncology for ongoing cancer treatment attempts.     They were open to discussing options for if/when there comes a time that oncology feels cancer treatment is no longer recommended or helpful. I told them this would be a decision they would make together with Dr. Durwin Reges. If/when that time comes, I did provide them with hospice education and answered questions. They were thankful for the information.    We made plans to check in again tomorrow morning to see how the pain med changes do for him.      D/w RN.    D/w NP Alex and Dr. Jena Gauss on [place].    Objective  Vitals:    12/31/22 0837 12/31/22 0941 12/31/22 1129 12/31/22 1136   BP: 105/71  111/74    Pulse: (!) 111  (!) 118    Resp: 18 18 18 18    Temp: 97.6 F (36.4 C)  97.4 F (36.3 C)    TempSrc: Oral  Oral    SpO2: 97%  93%  Weight:       Height:            Exam:   General: oriented but will fall asleep mid-sentence  Eye: PERRL, EOMI  HEENT: dry oral mucosa, no scleral icterus, severe bilateral temporal wasting noted  Neck: full ROM  Lungs: non-labored respiration on RA, equal chest rise bilaterally  Heart: bilateral LE edema  Abdomen: distended  Musculoskeletal: bilateral LE edema, moves all extremities well  Skin: skin is dry, no rashes or lesions  Neurologic: no seizure or myoclonus  Psychiatric: normal mood and affect    Labs:  CBC:   Recent Labs     12/29/22  1751 12/30/22  0631 12/30/22  1222 12/30/22  2049 12/31/22  0458   WBC 30.5* 22.0*  --   --  21.7*   HGB 9.0* 6.8* 6.8* 8.6* 8.1*   PLT 434 303  --   --  295     Last 3 CMP:   Recent Labs     12/29/22  1751 12/30/22  0631 12/31/22  0458   NA 130* 130* 131*   K 4.7 4.4 4.3   CL 91* 99 98    CO2 21* 18* 20*   BUN 17 15 10    CREATININE 0.7 0.5* 0.5*   GLUCOSE 103* 125* 73   CALCIUM 8.7 7.3* 7.5*   BILITOT 0.59 0.48 0.82   ALKPHOS 692* 514* 592*   AST 159* 123* 142*   ALT 29 20 21         Imaging:  Vascular duplex lower extremity venous bilateral    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    No evidence of acute deep vein thrombosis in the right lower extremity.    No evidence of acute deep vein thrombosis in the left lower extremity.    The right small saphenous vein was partially compressible and contained   hyperechoic intraluminal material consistent with superficial vein   thrombophlebitis.      Assessment/ Plan  61yoM with recently diagnosed metastatic gastric cancer and OSA not on CPAP who presented with weakness, SOB and calf pain and was found to have sepsis with lactic acidemia and concern for peritonitis.      Palliative care team was consulted for goals of care.     Acute on chronic cancer-related abdominal/back/leg pain and chronic back pain - has been on percocet for 10 years.  - would continue his oxycontin 20mg  q12h  - will change his percocet 7.5/325mg  q6h prn to oxycodone 10mg  10mg  q4h prn. Will need to move away from combo med as he is needing more opioid and we are going to reach the ceiling on tylenol dosing. Pt will keep track of what he is needing and may need an increase in his oxycontin to 30mg  at next oncology visit if requiring frequent PRNs.  - might consider steroids once out of window of acute infection?    OIC - recommended senna, up to 4 tabs twice daily. He is also taking miralax.  - increased senna to 2 tabs BID here as no BM in several days    Fatigue/sedation - likely a combination of issues - cancer-related, chemo-related, acute infection, poor nutrition, medications. This has been going on for weeks, per pt and family, despite him being on the same dose of opioids for weeks now.   - recommended stopping olanzapine which he has been  taking for appetite and doesn't seem to be helping. He does not have  nausea.  - would consider ritalin as outpatient if sedation continues after olanzapine stopped. Would need to consider cardiac side effects.      Very guarded long-term prognosis     Goals of care: ongoing cancer-directed therapies as able.   Surrogate medical decision-maker: presumed wife  Code status: DNR after discussion with hospitalist  Advance directive: none on file    Amedeo Kinsman, MD  Palliative Care    Plan of care regarding patient's condition and preferences was discussed with the interdisciplinary team members to include: RN, NP, MD    I assessed understanding of illness, reviewed information and answered questions.    Patient and wife voluntarily engaged in advance care planning. These additional people were present: none.  Advance directives were appropriately explained, as documented in the subjective above, including any change in health care wishes if the patient becomes unable to make their own decisions.  Time spent discussing ACP during the face-to-face encounter: 16 minutes    Time spent directly interacting with patient/family and/or coordinating care with other team members: start 1:15pm; end 252pm  Total time: - ACP time = 

## 2022-12-31 NOTE — Progress Notes (Addendum)
Hospitalist Daily Progress Note    Subjective:     Improving from a respiratory standpoint.  WBC still elevated but improved.  Hoping to go home soon.  Still with a lot of pain requiring iv morphine.     Medications:   Scheduled Meds:   senna  1 tablet Oral Nightly    vancomycin  1,000 mg IntraVENous Q12H    sodium chloride flush  5-40 mL IntraVENous 2 times per day    piperacillin-tazobactam  3,375 mg IntraVENous Q8H    pantoprazole  40 mg Oral QAM AC    oxyCODONE  20 mg Oral BID    polyethylene glycol  17 g Oral BID    trimethoprim-polymyxin b  1 drop Left Eye 6x Daily     Continuous Infusions:   sodium chloride      sodium chloride       PRN Meds:oxyCODONE-acetaminophen, morphine, sodium chloride, sodium chloride flush, sodium chloride, potassium chloride **OR** potassium alternative oral replacement **OR** potassium chloride, magnesium sulfate, ondansetron **OR** ondansetron, polyethylene glycol, acetaminophen **OR** acetaminophen, simethicone, magic (miracle) mouthwash with nystatin ([facility] only), LORazepam, Polyvinyl Alcohol-Povidone PF      Objective:    BP 111/74   Pulse (!) 118   Temp 97.4 F (36.3 C) (Oral)   Resp 18   Ht 1.753 m (5\' 9" )   Wt 65.8 kg (145 lb)   SpO2 93%   BMI 21.41 kg/m     Physical Exam:  General: acutely and chronically ill appearing    Cardiac: Tachycardic, regular, mild LE edema.    Pulmonary: clear to auscultation bilaterally, no respiratory distress  GI: soft, non-tender, no rebound or guarding, distended, active bowel sounds  MSK: no joint tenderness or swelling, no edema   Skin: no rashes or lesion, dry and warm   Neuro: no confusion, strength symmetric bilaterally, no numbness in extremities     Cardiographics  No results found for this or any previous visit.      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   No evidence of acute deep vein thrombosis in the  right lower extremity.   No evidence of acute deep vein thrombosis in the left lower extremity. The right small saphenous vein was partially compressible and contained hyperechoic intraluminal material consistent with superficial vein thrombophlebitis.     CT ABDOMEN PELVIS W IV CONTRAST Additional Contrast? None    Result Date: 12/29/2022  CT abdomen pelvis with contrast: 12/29/22 INDICATION: "abd pain worsening". History of metastatic gastric cancer. COMPARISON: CT 12/16/2022 TECHNIQUE: Routine protocol (Axial portal venous phase imaging from the lung bases to the pubic symphysis following IV contrast with coronal reconstruction.)  CT scanning was performed using radiation dose reduction techniques when appropriate, per system protocols. FINDINGS: Lung bases: Partially visualized pulmonary metastatic disease with the reference  lesion in the right lower lobe measuring 3.4 cm however, some of the other nodules in the visualized lower lungs have increased in size. Overall, pulmonary  findings are better evaluated on the chest CT from earlier this afternoon (this  demonstrated progression). Liver: Widespread hepatic metastases, not significantly changed from prior with the reference lesion in the left hepatic lobe measuring 5.3 cm (previously 5.2 cm when measured in a similar fashion). Gallbladder: No calcified gallstones. Pericholecystic fluid which may be related  to underlying hepatic pathology as the gallbladder is not abnormally distended. Spleen: Normal. Pancreas: No  evidence of measurable lesion and no ductal dilatation. Adrenal glands: No evidence of adrenal gland metastases although there are lesions closely adjacent to the left adrenal gland with the reference lesion measuring 2.5 cm (previously 2.3 cm). Kidneys: Renal collecting systems opacified by processed IV contrast. No hydrocephalus. Symmetric nephrograms. Bowel: Abnormal soft tissue thickening in the region of the GE junction and gastric cardia,  correlating with prior findings and not appreciably changed. No evidence of bowel obstruction. Moderately large stool burden suggesting constipation. The appendix is noninflamed. Free fluid: Small volume pelvic free fluid with findings concerning for peritonitis as the peritoneal lining is thickened and enhancing. There is also a  trace amount of perihepatic fluid and fluid along the left paracolic gutter where there are also findings concerning for mild peritoneal thickening. Findings may indicate carcinomatosis. Free air: None. Lymphadenopathy: Multiple pathologically enlarged upper abdominal and retroperitoneal lymph nodes. A reference lesion adjacent to the left adrenal gland measures 2.5 cm (previously 2.3 cm) and a lesion along the posterior margin of the IVC measures 2.1 cm (previously 2.2 cm). Pelvic organs: No abnormal filling defect in the opacified portion of the bladder. Prostamegaly with mild impression on the base the bladder. Bones: No concerning osseous lesion. Prior left L4-5 transpedicular fixation with interbody fixation at L4-5 and L5-S1. Osseous union at these levels as well  as L2-3. There is likely significant canal stenosis at L3-4 and L2-3. Vasculature: Portal vein remains patent. Unchanged flattening of the intrahepatic portion of the IVC. Body wall: Anasarca.     1.  Redemonstrated soft tissue lesion at the GE junction and gastric cardia, not  significantly changed. Redemonstrated metastatic disease with no significant progression in hepatic disease burden or upper abdominal lymphadenopathy. Partially visualized metastases in the lung bases, some of which have increased in size (more completely evaluated on the chest CT from earlier today). 2.  Small volume abdominopelvic free fluid with findings concerning for peritoneal thickening and enhancement. 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 3.  Moderately large stool  burden, consistent with constipation. 4.  Degenerative and postoperative changes of the lumbar spine, as above.    CT CHEST W CONTRAST    Result Date: 12/29/2022  CT CHEST WITH IV CONTRAST:   12/29/22 INDICATION: metastatic gastric cancer   COMPARISON: Chest CT 12/16/2022, PET 10/31/2022, CTA chest 10/25/2022 TECHNIQUE: Images were obtained through the chest with IV contrast without adverse reaction.  CT scanning was performed using radiation dose reduction techniques when appropriate, per system protocol. FINDINGS: Significant increase in size and number of previously noted pulmonary metastases. Mass in the left posterior costophrenic sulcus that previously measured 22 x 20 mm now measures about 39 x 31 mm. Previously noted nodule on the right that measured 24 mm in maximum dimension now measures about 36 mm. Trace right pleural fluid. Right IJ port. No thyroid nodule. No enlarged nodes. Moderate coronary artery calcification. No cardiomegaly or pericardial effusion. Largest lesion in liver included on the study is in the right lobe and measures about 86 mm, previously measuring about 80 mm on 12/16/2022 exam. Large gastric cardia mass again seen similar to 12/16/2022. Extensive adenopathy is seen in the upper abdomen, only partially included on the study, similar to 12/16/2022 CT. No bone lesion appreciated.     Significant worsening of pulmonary metastatic disease. Little change or some worsening of hepatic metastases from 12/16/2022. No change appreciated in extensive adenopathy in the upper abdomen or mass in  the gastric cardia.      Lab Review   Lab Results   Component Value Date    WBC 21.7 (H) 12/31/2022    HGB 8.1 (L) 12/31/2022    HCT 28.2 (L) 12/31/2022    MCV 81.3 (L) 12/31/2022    PLT 295 12/31/2022     Lab Results   Component Value Date    NA 131 (L) 12/31/2022    K 4.3 12/31/2022    CL 98 12/31/2022    CO2 20 (L) 12/31/2022    BUN 10 12/31/2022    CREATININE 0.5 (L) 12/31/2022    GLUCOSE 73 12/31/2022    CALCIUM  7.5 (L) 12/31/2022    BILITOT 0.82 12/31/2022    ALKPHOS 592 (H) 12/31/2022    AST 142 (H) 12/31/2022    ALT 21 12/31/2022    LABGLOM 115 12/31/2022    GFRAA 113 09/05/2019    GLOB 3.7 12/31/2022       Assessment/Plan   Principal Problem:    Stomach cancer (HCC)  Active Problems:    SIRS (systemic inflammatory response syndrome) (HCC)    Peritoneal carcinomatosis (HCC)  Resolved Problems:    * No resolved hospital problems. *     Sepsis present on admission associated with stage IV gastric carcinoma-concern with lactic acidemia, hypotension, neutrophilic leukocytosis at 30.5 K on presentation -CT of chest abdomen pelvis performed with no recognized pulmonary embolus or appreciable pulmonary infiltrate with patient's concern for worsening dyspnea on exertion.  -By abdominopelvic CT imaging has concern for peritoneal thickening concerning for early peritonitis.  -Will continue empiric Zosyn.  DC vanc.  Blood culture and urinalysis and culture pending   DC IVF today with increased swelling     Stage IV gastric carcinoma squamous   -oncology and palliative following     OSA  -CPAP intolerant.    Calf discomfort  - had previous lower extremity provoked DVT.  DVT study with superficial thrombophlebitis and right small saphenous vein, negative for DVT    Anemia, mixed IDA and chronic disease  -Hemoglobin 6.8. 7/23. Eventually agreeable to blood transfusion, received 1 unit. No evidence of blood loss    Diet: ADULT DIET; Regular  Dvt prophy: SCDs  DNR  Dispo: home possibly tomorrow after further improvement in leukocytosis     Lorna Few, MD

## 2023-01-01 LAB — CBC WITH AUTO DIFFERENTIAL
Basophils %: 0.3 % (ref 0.0–2.0)
Basophils Absolute: 0.1 10*3/uL (ref 0.0–0.2)
Eosinophils %: 0.1 % (ref 0.0–7.0)
Eosinophils Absolute: 0 10*3/uL (ref 0.0–0.5)
Hematocrit: 27.3 % — ABNORMAL LOW (ref 38.0–52.0)
Hemoglobin: 8 g/dL — ABNORMAL LOW (ref 13.0–17.3)
Immature Grans (Abs): 0.64 10*3/uL — ABNORMAL HIGH (ref 0.00–0.06)
Immature Granulocytes %: 2.6 % — ABNORMAL HIGH (ref 0.0–0.6)
Lymphocytes Absolute: 1.6 10*3/uL (ref 1.0–3.2)
Lymphocytes: 6.6 % — ABNORMAL LOW (ref 15.0–45.0)
MCH: 23.5 pg — ABNORMAL LOW (ref 27.0–34.5)
MCHC: 29.3 g/dL — ABNORMAL LOW (ref 30.0–36.0)
MCV: 80.1 fL — ABNORMAL LOW (ref 84.0–100.0)
MPV: 9.2 fL (ref 7.0–12.2)
Monocytes %: 12.2 % — ABNORMAL HIGH (ref 4.0–12.0)
Monocytes Absolute: 3 10*3/uL — ABNORMAL HIGH (ref 0.3–1.0)
NRBC Absolute: 0 10*3/uL (ref 0.000–0.012)
NRBC Automated: 0 % (ref 0.0–0.2)
Neutrophils %: 78.2 % — ABNORMAL HIGH (ref 42.0–74.0)
Neutrophils Absolute: 19.1 10*3/uL — ABNORMAL HIGH (ref 1.6–7.3)
Platelets: 265 10*3/uL (ref 140–440)
RBC: 3.41 x10e6/mcL — ABNORMAL LOW (ref 4.00–5.60)
RDW: 20.8 % — ABNORMAL HIGH (ref 10.0–17.0)
WBC: 24.5 10*3/uL — ABNORMAL HIGH (ref 3.8–10.6)

## 2023-01-01 LAB — ECHO (TTE) COMPLETE (PRN CONTRAST/BUBBLE/STRAIN/3D)
AV Area by Peak Velocity: 1.9 cm2
AV Area by VTI: 2.2 cm2
AV Mean Gradient: 2 mmHg
AV Mean Velocity: 0.7 m/s
AV Peak Gradient: 4 mmHg
AV Peak Velocity: 1.1 m/s
AV VTI: 15.8 cm
AV Velocity Ratio: 0.64
AVA/BSA Peak Velocity: 1.1 cm2/m2
AVA/BSA VTI: 1.2 cm2/m2
Ao Root Index: 1.83 cm/m2
Aortic Root: 3.3 cm
Ascending Aorta Index: 1.83 cm/m2
Ascending Aorta: 3.3 cm
Body Surface Area: 1.79 m2
E/E' Lateral: 5.25
E/E' Ratio (Averaged): 6.13
E/E' Septal: 7
Est. RA Pressure: 3 mmHg
Fractional Shortening 2D: 46 % (ref 28–44)
IVC Expiration: 1.5 cm
IVSd: 1.5 cm — AB (ref 0.6–1.0)
LA Diameter: 2.4 cm
LA Size Index: 1.33 cm/m2
LA Volume A-L A4C: 25 mL (ref 18–58)
LA Volume Index A-L A4C: 14 mL/m2 — AB (ref 16–34)
LA Volume Index MOD A4C: 13 ml/m2 — AB (ref 16–34)
LA Volume MOD A4C: 23 mL (ref 18–58)
LA/AO Root Ratio: 0.73
LV E' Lateral Velocity: 8 cm/s
LV E' Septal Velocity: 6 cm/s
LV EDV A4C: 100 mL
LV EDV Index A4C: 56 mL/m2
LV ESV A4C: 49 mL
LV ESV Index A4C: 27 mL/m2
LV Ejection Fraction A4C: 51 %
LV Mass 2D Index: 92.5 g/m2 (ref 49–115)
LV Mass 2D: 166.5 g (ref 88–224)
LV RWT Ratio: 0.59
LVIDd Index: 2.06 cm/m2
LVIDd: 3.7 cm — AB (ref 4.2–5.9)
LVIDs Index: 1.11 cm/m2
LVIDs: 2 cm
LVOT Area: 3.1 cm2
LVOT Diameter: 2 cm
LVOT Mean Gradient: 1 mmHg
LVOT Peak Gradient: 2 mmHg
LVOT Peak Velocity: 0.7 m/s
LVOT SV: 34.9 ml
LVOT Stroke Volume Index: 19.4 mL/m2
LVOT VTI: 11.1 cm
LVOT:AV VTI Index: 0.7
LVPWd: 1.1 cm — AB (ref 0.6–1.0)
MV A Velocity: 0.45 m/s
MV E Velocity: 0.42 m/s
MV E Wave Deceleration Time: 108.1 ms
MV E/A: 0.93
RV Free Wall Peak S': 21 cm/s
RVIDd: 3.7 cm
RVSP: 11 mmHg
TAPSE: 2.1 cm (ref 1.7–?)
TR Max Velocity: 1.39 m/s
TR Peak Gradient: 8 mmHg

## 2023-01-01 LAB — COMPREHENSIVE METABOLIC PANEL
ALT: 23 U/L (ref 0–50)
AST: 153 U/L — ABNORMAL HIGH (ref 0–50)
Albumin/Globulin Ratio: 0.5 — ABNORMAL LOW (ref 1.00–2.70)
Albumin: 1.8 g/dL — ABNORMAL LOW (ref 3.5–5.2)
Alk Phosphatase: 622 U/L — ABNORMAL HIGH (ref 40–130)
Anion Gap: 13 mmol/L (ref 2–17)
BUN: 11 mg/dL (ref 8–23)
CALCIUM,CORRECTED,CCA: 9.4 mg/dL (ref 8.5–10.7)
CO2: 19 mmol/L — ABNORMAL LOW (ref 22–29)
Calcium: 7.6 mg/dL — ABNORMAL LOW (ref 8.5–10.7)
Chloride: 96 mmol/L — ABNORMAL LOW (ref 98–107)
Creatinine: 0.5 mg/dL — ABNORMAL LOW (ref 0.7–1.3)
Est, Glom Filt Rate: 115 mL/min/1.73m?? (ref >=60)
Globulin: 3.8 g/dL (ref 1.9–4.4)
Glucose: 93 mg/dL (ref 70–99)
Osmolaliy Calculated: 256 mOsm/kg — ABNORMAL LOW (ref 270–287)
Potassium: 4.3 mmol/L (ref 3.5–5.3)
Sodium: 128 mmol/L — ABNORMAL LOW (ref 135–145)
Total Bilirubin: 1.04 mg/dL (ref 0.00–1.20)
Total Protein: 5.6 g/dL — ABNORMAL LOW (ref 5.7–8.3)

## 2023-01-01 LAB — LACTIC ACID
Lactic Acid: 3.1 mmol/L (ref 0.5–2.0)
Lactic Acid: 3.7 mmol/L (ref 0.5–2.0)

## 2023-01-01 MED ORDER — BISACODYL 10 MG RE SUPP
10 | Freq: Once | RECTAL | Status: DC
Start: 2023-01-01 — End: 2023-01-02

## 2023-01-01 MED ORDER — FUROSEMIDE 20 MG PO TABS
20 | Freq: Every day | ORAL | Status: DC
Start: 2023-01-01 — End: 2023-01-02
  Administered 2023-01-01 – 2023-01-02 (×2): 20 mg via ORAL

## 2023-01-01 MED FILL — PANTOPRAZOLE SODIUM 40 MG PO TBEC: 40 MG | ORAL | Qty: 1

## 2023-01-01 MED FILL — OXYCODONE HCL 5 MG PO TABS: 5 MG | ORAL | Qty: 2

## 2023-01-01 MED FILL — OXYCONTIN 20 MG PO T12A: 20 MG | ORAL | Qty: 1

## 2023-01-01 MED FILL — SENOKOT 8.6 MG PO TABS: 8.6 MG | ORAL | Qty: 2

## 2023-01-01 MED FILL — DOCUSATE SODIUM 100 MG PO CAPS: 100 MG | ORAL | Qty: 1

## 2023-01-01 MED FILL — NORMAL SALINE FLUSH 0.9 % IV SOLN: 0.9 % | INTRAVENOUS | Qty: 10

## 2023-01-01 MED FILL — MORPHINE SULFATE (PF) 4 MG/ML IJ SOLN: 4 mg/mL | INTRAMUSCULAR | Qty: 1

## 2023-01-01 MED FILL — PIPERACILLIN SOD-TAZOBACTAM SO 3.375 (3-0.375) G IV SOLR: 3.375 (3-0.375) g | INTRAVENOUS | Qty: 3375

## 2023-01-01 MED FILL — POLYETHYLENE GLYCOL 3350 17 G PO PACK: 17 g | ORAL | Qty: 1

## 2023-01-01 MED FILL — NORMAL SALINE FLUSH 0.9 % IV SOLN: 0.9 % | INTRAVENOUS | Qty: 30

## 2023-01-01 MED FILL — BISACODYL 10 MG RE SUPP: 10 MG | RECTAL | Qty: 1

## 2023-01-01 MED FILL — FUROSEMIDE 20 MG PO TABS: 20 MG | ORAL | Qty: 1

## 2023-01-01 MED FILL — NORMAL SALINE FLUSH 0.9 % IV SOLN: 0.9 % | INTRAVENOUS | Qty: 40

## 2023-01-01 NOTE — Progress Notes (Signed)
Subjective  D/w CM  D/w RN. Pt reported better pain control to her this morning. No BM yet but getting a suppository.   D/w Dr. Jena Gauss    Met with pt. He is sitting in the chair in his underwear only. He reports ongoing issues with pain. His level of alertness is unchanged from yesterday, still nodding off mid-sentence at times.   We reviewed many points of our meeting yesterday.  Pt hasn't noticed much of a difference yet with the change in his pain meds, though he has only received 2 doses of the oxycodone. He understands he can ask for this every 4 hours as needed. We will see how much he needs and can make adjustments.    D/w RN    Objective  Vitals:    01/01/23 0001 01/01/23 0406 01/01/23 0731 01/01/23 1125   BP: (!) 92/57 106/70 102/68    Pulse: (!) 101 (!) 103 (!) 115    Resp: 18 18 18 16    Temp: 98.3 F (36.8 C) 97.8 F (36.6 C) 97.8 F (36.6 C)    TempSrc: Oral Oral Oral    SpO2: 97% 99% 97%    Weight:       Height:            Exam:   General: oriented but will fall asleep mid-sentence  Eye: PERRL, EOMI  HEENT: dry oral mucosa, no scleral icterus, severe bilateral temporal wasting noted  Neck: full ROM  Lungs: non-labored respiration on RA, equal chest rise bilaterally  Heart: bilateral LE edema  Abdomen: distended  Musculoskeletal: bilateral LE edema, moves all extremities well  Skin: skin is dry, no rashes or lesions  Neurologic: no seizure or myoclonus  Psychiatric: normal mood and affect    Labs:  CBC:   Recent Labs     12/30/22  0631 12/30/22  1222 12/30/22  2049 12/31/22  0458 01/01/23  0451   WBC 22.0*  --   --  21.7* 24.5*   HGB 6.8*   < > 8.6* 8.1* 8.0*   PLT 303  --   --  295 265    < > = values in this interval not displayed.     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    No evidence of acute deep vein thrombosis in the right lower extremity.    No evidence of acute deep vein thrombosis in the left lower extremity.    The right small saphenous vein was partially compressible and contained   hyperechoic intraluminal material consistent with superficial vein   thrombophlebitis.      Assessment/ Plan  61yoM with recently diagnosed metastatic gastric cancer and OSA not on CPAP who presented with weakness, SOB and calf pain and was found to have sepsis with lactic acidemia and concern for peritonitis.  Palliative care team was consulted for goals of care.     Acute on chronic cancer-related abdominal/back/leg pain and chronic back pain - has been on percocet for 10 years.  - would continue his oxycontin 20mg  q12h  - changed percocet 7.5/325mg  q6h prn to oxycodone 10mg  10mg  q4h prn, would continue at this dose today. Needed to move away from combo med as he is needing more opioid and we are going to reach the ceiling on tylenol dosing. Pt will keep track of what he is needing and may need an increase in his oxycontin to 30mg  at next oncology visit if requiring frequent PRNs.  - Also getting morphine 4mg  IV q4h PRN, only required 2 doses in the past 24h  - might consider steroids once out of window of acute infection?    Opioids are high-risk medications that require careful and intense monitoring for efficacy and adverse effects.    OIC - recommended senna, up to 4 tabs twice daily. He is also taking miralax.  - continue senna to 2 tabs BID here as no BM in several days, getting a suppository    Fatigue/sedation - likely a combination of issues - cancer-related, chemo-related, acute infection, poor nutrition, medications. This has been going on for weeks, per pt and family, despite him being on  the same dose of opioids for weeks now.   - recommended stopping olanzapine which he has been taking for appetite as an outpatient and doesn't seem to be helping and likely contributing to sedation. He does not have nausea.  - would consider ritalin as outpatient if sedation continues after olanzapine stopped. Would need to consider cardiac side effects.      Very guarded long-term prognosis     Goals of care: ongoing cancer-directed therapies as able.   Surrogate medical decision-maker: presumed wife  Code status: DNR after discussion with hospitalist  Advance directive: none on file    Amedeo Kinsman, MD  Palliative Care    Plan of care regarding patient's condition and preferences was discussed with the interdisciplinary team members to include: RN, CM, MD  I assessed understanding of illness, reviewed information and answered questions.

## 2023-01-01 NOTE — Progress Notes (Addendum)
Comprehensive Nutrition Assessment    Type and Reason for Visit:  Initial (MD c/s-poor appetite, MST 4)    Nutrition Recommendations/Plan:   Continue current diet. Encouraged PO intake.  Ordered Magic cups TID (870 kcal, 27 g protein). Pt also plans to take Boost high protein shakes from home TID (750 kcal, 60 g protein).      Malnutrition Assessment:  Malnutrition Status:  Severe malnutrition (01/01/23 1311)    Context:  Chronic Illness     Findings of the 6 clinical characteristics of malnutrition:  Energy Intake:  75% or less estimated energy requirements for 1 month or longer  Weight Loss:   (10.5% severe unintentional weight loss x 2 months)     Body Fat Loss:  Severe body fat loss Orbital, Triceps   Muscle Mass Loss:  Severe muscle mass loss Temples (temporalis), Clavicles (pectoralis & deltoids)  Fluid Accumulation:       Grip Strength:       Nutrition Assessment:    62 y.o. 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 Dx sepsis, anemia, peritonitis. C diff negative. Calf discomfort, DVT study with superficial thrombophlebitis and right small saphenous vein, negative for DVT. Palliative care c/s, DNR.     Past Medical History:   Diagnosis Date    Back pain     Lumbar disc disease     OSA (obstructive sleep apnea)     Doesnt use machine    Sciatica      Scheduled Meds:   bisacodyl  10 mg Rectal Once    furosemide  20 mg Oral Daily    docusate sodium  100 mg Oral BID    senna  2 tablet Oral BID    sodium chloride flush  5-40 mL IntraVENous 2 times per day    piperacillin-tazobactam  3,375 mg IntraVENous Q8H    pantoprazole  40 mg Oral QAM AC    oxyCODONE  20 mg Oral BID    polyethylene glycol  17 g Oral BID    trimethoprim-polymyxin b  1 drop Left Eye 6x Daily     Continuous Infusions:   sodium chloride      sodium chloride       PRN Meds:.oxyCODONE, morphine, sodium chloride, sodium chloride flush, sodium chloride,  potassium chloride **OR** potassium alternative oral replacement **OR** potassium chloride, magnesium sulfate, ondansetron **OR** ondansetron, polyethylene glycol, acetaminophen **OR** acetaminophen, simethicone, magic (miracle) mouthwash with nystatin ([facility] only), LORazepam, Polyvinyl Alcohol-Povidone PF    Lab Results   Component Value Date/Time    NA 128 01/01/2023 04:51 AM    K 4.3 01/01/2023 04:51 AM    CL 96 01/01/2023 04:51 AM    CO2 19 01/01/2023 04:51 AM    BUN 11 01/01/2023 04:51 AM    CREATININE 0.5 01/01/2023 04:51 AM    GLUCOSE 93 01/01/2023 04:51 AM    CALCIUM 7.6 01/01/2023 04:51 AM     No results found for: "TRIG"  No results found for: "POCGLU"    Current Nutrition Intake & Therapies:    Average Meal Intake: 1-25%     ADULT DIET; Regular, NKFA    Pt reports normally eating 3 meals/day at home. However, poor appetite since being dx with cancer in May of this year, was mostly just eating 2 meals/day during that timeframe. Since admit, poor appetite, reports eating 5% of his meals. Denies N/V/D/C or difficulty chewing/swallowing, but does report abdominal distention. LBM 7/25. Pt doesn't  like ensure but has been drinking boost high protein from home once/day, agreeable to take TID. Also agreeable to magic cups.     Social hx: No issues with food security/access to food.     Nutrition Related Findings:      Wound Type: None         Anthropometric Measures:  Height: 175.3 cm (5' 9.02")  Ideal Body Weight (IBW): 160 lbs (73 kg)       Current Body Weight: 65.8 kg (145 lb 1 oz), 90.7 % IBW.    Current BMI (kg/m2): 21.4  BMI Categories: Normal Weight (BMI 18.5-24.9)    UBW: 180 lb/81.8 kg, reports unintentional weight loss since May of this year.   Per EMR:  73.5 kg (10/27/22)  69.4 kg (11/17/22)  65.8 kg (12/31/22)-10.5% wt loss x 2 months (severe)    Estimated Daily Nutrient Needs:  Energy Requirements Based On: Kcal/kg  Weight Used for Energy Requirements: Current  Energy (kcal/day): 2440-1027 kcal (30-35  kcal/kg)  Weight Used for Protein Requirements: Current  Protein (g/day): 99-132 g (1.5-2 g/kg-20-23%EER)  Method Used for Fluid Requirements: ml/Kg  Fluid (ml/day): 1974 ml (30 ml/kg) or per MD  Carb (g/day): 247-288 g (50%EER)    Nutrition Diagnosis:   Severe malnutrition related to inadequate protein-energy intake, catabolic illness as evidenced by Criteria as identified in malnutrition assessment (cancer)    Nutrition Interventions:   Food and/or Nutrient Delivery: Continue Current Diet, Start Oral Nutrition Supplement  Nutrition Education/Counseling: Education not indicated          Goals:     Goals: Meet at least 75% of estimated needs, by next RD assessment (Stable weight.)       Nutrition Monitoring and Evaluation:      Food/Nutrient Intake Outcomes: Food and Nutrient Intake, Supplement Intake  Physical Signs/Symptoms Outcomes: Biochemical Data, Chewing or Swallowing, Skin, Weight    Discharge Planning:    Continue current diet, Continue Oral Nutrition Supplement     RD to f/u within 6 days.   Rosie Fate, RD  Contact: Thank you for allowing me to participate in the care of this patient.   Please contact your Registered Dietitian with any questions or concerns.     [facility]: [phone] Thelma Barge: [phone]  [facility]: [phone]  [facility]: [phone]  Or message your clinical nutrition team via [facility]

## 2023-01-01 NOTE — Progress Notes (Signed)
Hospitalist Daily Progress Note    Subjective:     Pain better controlled with increase in meds.  Still has not had a bowel movement so asking for suppository.  WBC still elevated.  Complains of swelling in the legs.    Medications:   Scheduled Meds:   furosemide  20 mg Oral Daily    docusate sodium  100 mg Oral BID    senna  2 tablet Oral BID    sodium chloride flush  5-40 mL IntraVENous 2 times per day    piperacillin-tazobactam  3,375 mg IntraVENous Q8H    pantoprazole  40 mg Oral QAM AC    oxyCODONE  20 mg Oral BID    polyethylene glycol  17 g Oral BID    trimethoprim-polymyxin b  1 drop Left Eye 6x Daily     Continuous Infusions:   sodium chloride      sodium chloride       PRN Meds:oxyCODONE, morphine, sodium chloride, sodium chloride flush, sodium chloride, potassium chloride **OR** potassium alternative oral replacement **OR** potassium chloride, magnesium sulfate, ondansetron **OR** ondansetron, polyethylene glycol, acetaminophen **OR** acetaminophen, simethicone, magic (miracle) mouthwash with nystatin ([facility] only), LORazepam, Polyvinyl Alcohol-Povidone PF      Objective:    BP 102/68   Pulse (!) 115   Temp 97.8 F (36.6 C) (Oral)   Resp 18   Ht 1.753 m (5\' 9" )   Wt 65.8 kg (145 lb)   SpO2 97%   BMI 21.41 kg/m     Physical Exam:  General: acutely and chronically ill appearing    Cardiac: Tachycardic, regular, mild LE edema.    Pulmonary: clear to auscultation bilaterally, no respiratory distress  GI: soft, non-tender, no rebound or guarding, distended, active bowel sounds  MSK: no joint tenderness or swelling, no edema   Skin: no rashes or lesion, dry and warm   Neuro: no confusion, strength symmetric bilaterally, no numbness in extremities     Cardiographics  No results found for this or any previous visit.      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   No evidence of acute deep  vein thrombosis in the right lower extremity.   No evidence of acute deep vein thrombosis in the left lower extremity. The right small saphenous vein was partially compressible and contained hyperechoic intraluminal material consistent with superficial vein thrombophlebitis.     CT ABDOMEN PELVIS W IV CONTRAST Additional Contrast? None    Result Date: 12/29/2022  CT abdomen pelvis with contrast: 12/29/22 INDICATION: "abd pain worsening". History of metastatic gastric cancer. COMPARISON: CT 12/16/2022 TECHNIQUE: Routine protocol (Axial portal venous phase imaging from the lung bases to the pubic symphysis following IV contrast with coronal reconstruction.)  CT scanning was performed using radiation dose reduction techniques when appropriate, per system protocols. FINDINGS: Lung bases: Partially visualized pulmonary metastatic disease with the reference  lesion in the right lower lobe measuring 3.4 cm however, some of the other nodules in the visualized lower lungs have increased in size. Overall, pulmonary  findings are better evaluated on the chest CT from earlier this afternoon (this  demonstrated progression). Liver: Widespread hepatic metastases, not significantly changed from prior with the reference lesion in the left hepatic lobe measuring 5.3 cm (previously 5.2 cm when measured in a similar fashion). Gallbladder: No calcified gallstones. Pericholecystic fluid which may be related  to underlying hepatic  pathology as the gallbladder is not abnormally distended. Spleen: Normal. Pancreas: No evidence of measurable lesion and no ductal dilatation. Adrenal glands: No evidence of adrenal gland metastases although there are lesions closely adjacent to the left adrenal gland with the reference lesion measuring 2.5 cm (previously 2.3 cm). Kidneys: Renal collecting systems opacified by processed IV contrast. No hydrocephalus. Symmetric nephrograms. Bowel: Abnormal soft tissue thickening in the region of the GE junction and  gastric cardia, correlating with prior findings and not appreciably changed. No evidence of bowel obstruction. Moderately large stool burden suggesting constipation. The appendix is noninflamed. Free fluid: Small volume pelvic free fluid with findings concerning for peritonitis as the peritoneal lining is thickened and enhancing. There is also a  trace amount of perihepatic fluid and fluid along the left paracolic gutter where there are also findings concerning for mild peritoneal thickening. Findings may indicate carcinomatosis. Free air: None. Lymphadenopathy: Multiple pathologically enlarged upper abdominal and retroperitoneal lymph nodes. A reference lesion adjacent to the left adrenal gland measures 2.5 cm (previously 2.3 cm) and a lesion along the posterior margin of the IVC measures 2.1 cm (previously 2.2 cm). Pelvic organs: No abnormal filling defect in the opacified portion of the bladder. Prostamegaly with mild impression on the base the bladder. Bones: No concerning osseous lesion. Prior left L4-5 transpedicular fixation with interbody fixation at L4-5 and L5-S1. Osseous union at these levels as well  as L2-3. There is likely significant canal stenosis at L3-4 and L2-3. Vasculature: Portal vein remains patent. Unchanged flattening of the intrahepatic portion of the IVC. Body wall: Anasarca.     1.  Redemonstrated soft tissue lesion at the GE junction and gastric cardia, not  significantly changed. Redemonstrated metastatic disease with no significant progression in hepatic disease burden or upper abdominal lymphadenopathy. Partially visualized metastases in the lung bases, some of which have increased in size (more completely evaluated on the chest CT from earlier today). 2.  Small volume abdominopelvic free fluid with findings concerning for peritoneal thickening and enhancement. 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 3.   Moderately large stool burden, consistent with constipation. 4.  Degenerative and postoperative changes of the lumbar spine, as above.    CT CHEST W CONTRAST    Result Date: 12/29/2022  CT CHEST WITH IV CONTRAST:   12/29/22 INDICATION: metastatic gastric cancer   COMPARISON: Chest CT 12/16/2022, PET 10/31/2022, CTA chest 10/25/2022 TECHNIQUE: Images were obtained through the chest with IV contrast without adverse reaction.  CT scanning was performed using radiation dose reduction techniques when appropriate, per system protocol. FINDINGS: Significant increase in size and number of previously noted pulmonary metastases. Mass in the left posterior costophrenic sulcus that previously measured 22 x 20 mm now measures about 39 x 31 mm. Previously noted nodule on the right that measured 24 mm in maximum dimension now measures about 36 mm. Trace right pleural fluid. Right IJ port. No thyroid nodule. No enlarged nodes. Moderate coronary artery calcification. No cardiomegaly or pericardial effusion. Largest lesion in liver included on the study is in the right lobe and measures about 86 mm, previously measuring about 80 mm on 12/16/2022 exam. Large gastric cardia mass again seen similar to 12/16/2022. Extensive adenopathy is seen in the upper abdomen, only partially included on the study, similar to 12/16/2022 CT. No bone lesion appreciated.     Significant worsening of pulmonary metastatic disease. Little change or some worsening of hepatic metastases from 12/16/2022. No  change appreciated in extensive adenopathy in the upper abdomen or mass in the gastric cardia.      Lab Review   Lab Results   Component Value Date    WBC 24.5 (H) 01/01/2023    HGB 8.0 (L) 01/01/2023    HCT 27.3 (L) 01/01/2023    MCV 80.1 (L) 01/01/2023    PLT 265 01/01/2023     Lab Results   Component Value Date    NA 128 (L) 01/01/2023    K 4.3 01/01/2023    CL 96 (L) 01/01/2023    CO2 19 (L) 01/01/2023    BUN 11 01/01/2023    CREATININE 0.5 (L) 01/01/2023    GLUCOSE  93 01/01/2023    CALCIUM 7.6 (L) 01/01/2023    BILITOT 1.04 01/01/2023    ALKPHOS 622 (H) 01/01/2023    AST 153 (H) 01/01/2023    ALT 23 01/01/2023    LABGLOM 115 01/01/2023    GFRAA 113 09/05/2019    GLOB 3.8 01/01/2023       Assessment/Plan   Principal Problem:    Stomach cancer (HCC)  Active Problems:    SIRS (systemic inflammatory response syndrome) (HCC)    Peritoneal carcinomatosis (HCC)    Palliative care by specialist  Resolved Problems:    * No resolved hospital problems. *     Sepsis present on admission associated with stage IV gastric carcinoma-concern with lactic acidemia, hypotension, neutrophilic leukocytosis at 30.5 K on presentation -CT of chest abdomen pelvis performed with no recognized pulmonary embolus or appreciable pulmonary infiltrate with patient's concern for worsening dyspnea on exertion.  -By abdominopelvic CT imaging has concern for peritoneal thickening concerning for early peritonitis.  -Will continue empiric Zosyn.  DC vanc.  Blood culture and urine culture ngtd   DC IVF with increased swelling     Stage IV gastric carcinoma squamous   -oncology and palliative following     Constipation - increase bowel regimen    OSA  -CPAP intolerant.    Calf discomfort  - had previous lower extremity provoked DVT.  DVT study with superficial thrombophlebitis and right small saphenous vein, negative for DVT    Anemia, mixed IDA and chronic disease  -Hemoglobin 6.8. 7/23. Eventually agreeable to blood transfusion, received 1 unit. No evidence of blood loss    Severe malnutrition - RD following     Diet: ADULT DIET; Regular  Dvt prophy: SCDs  DNR  Dispo: home possibly tomorrow after further improvement in leukocytosis     Lorna Few, MD

## 2023-01-01 NOTE — Progress Notes (Addendum)
Subjective  Jesse Savage was seen and examined today.  He reports improvement overall.  He states that his oral pain medication regimen is adequate.  Abdomen is still tender to palpation overall.  He denies any increased dyspnea or cough. He continues to have complaint of constipation, plan for suppository today.  He remains afebrile.  No new or unexplained bleeding or bruising, melena, or hematochezia.  He denies any nausea or vomiting.  A comprehensive review of systems is negative.      ONCOLOGY HISTORY    Metastatic gastric cancer, squamous cell differentiation-PD-L1 positive, HER2 positive  Originally presenting to the emergency department with a 20 pound weight loss, abdominal discomfort, and low grade fevers  CT abdomen/pelvis 10/25/22: There is a gastric mass at the gastric fundus measuring approximately 4.2 x 4.0 cm in size with multiple abnormal hepatic masses, multiple abnormally enlarged   lymph nodes, and multiple abnormal pulmonary nodules likely representing   extensive metastatic disease.  CTA chest 10/25/22: No evidence of acute pulmonary embolism as clinically queried. No pneumonia. No pleural effusion. No pneumothorax. Multiple bilateral pulmonary nodules with multiple hepatic lesions most likely representing extensive metastatic disease.  5/22  PET/CT 10/31/22: Intensely hypermetabolic gastric cardia mass with associated regional   hypermetabolic lymphadenopathy and more distant, fairly extensive pulmonary and   hepatic metastases.  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, squamous cell  10/31/22 IR Port placement  11/10/22 cycle 1 day 1 FOLFOX  11/24/22 added Nivolumab   12/16/22 CT abdomen/pelvis: Progression of neoplasm at all pre-existing sites, multiple coiling lesions in the lung bases, dominant left-sided lesion 3.3 cm previously 2.4 cm, extensive intrahepatic metastatic burden also increased, representative  lesion 5 cm previously 4 cm, increased size of bulky low-density upper abdominal adenopathy including periaortic distribution, prominent gastric cardia mass increased in prominence    Objective      Vitals & Measurements  VITALS:  BP 103/66   Pulse 99   Temp 97.7 F (36.5 C) (Oral)   Resp 18   Ht 1.753 m (5' 9.02")   Wt 65.8 kg (145 lb)   SpO2 99%   BMI 21.40 kg/m     24HR INTAKE/OUTPUT:    Intake/Output Summary (Last 24 hours) at 01/01/2023 1558  Last data filed at 01/01/2023 1113  Gross per 24 hour   Intake 10 ml   Output 450 ml   Net -440 ml         Physical Exam  Constitutional: NAD. Acute on chronically ill appearing male sitting up in recliner; temporal wasting present  Eyes:  Conjunctiva normal; eyelids normal; PERRLA; sclera anicteric  Ear, Nose, Throat:  External ears and nose normal; hearing grossly normal; poor dentition  Neck:  Supple; no masses; no adenopathy  Respiratory: CTAB, symmetric chest rise  Cardiovascular:  Normal heart sounds; regular rate and rhythm; 2+ bilateral lower extremity edema, left greater than right   Abdomen: Abdomen distended, hypoactive bowel sounds,tenderness upon palpation throughout   Skin:  No rash; no petechiae; no ecchymoses; pallor; no cutaneous nodules;  Musculoskeletal:  Generalized weakness, right intrascapular pain  Neurologic:  No focal motor or sensory deficit; gait not assessed; no abnormal mental status  Psychiatric:  Oriented to person time and place; mood and affect appropriate to situation; appropriate judgment and insight; memory intact  Access: Right upper chest port-not accessed    Lab Results  Recent Results (from the past 24 hour(s))   CBC  with Auto Differential    Collection Time: 01/01/23  4:51 AM   Result Value Ref Range    WBC 24.5 (H) 3.8 - 10.6 x10e3/mcL    RBC 3.41 (L) 4.00 - 5.60 x10e6/mcL    Hemoglobin 8.0 (L) 13.0 - 17.3 g/dL    Hematocrit 32.9 (L) 38.0 - 52.0 %    MCV 80.1 (L) 84.0 - 100.0 fL    MCH 23.5 (L) 27.0 - 34.5 pg    MCHC 29.3  (L) 30.0 - 36.0 g/dL    RDW 51.8 (H) 84.1 - 17.0 %    Platelets 265 140 - 440 x10e3/mcL    MPV 9.2 7.0 - 12.2 fL    NRBC Automated 0.0 0.0 - 0.2 %    NRBC Absolute 0.000 0.000 - 0.012 x10e3/mcL    Neutrophils % 78.2 (H) 42.0 - 74.0 %    Lymphocytes 6.6 (L) 15.0 - 45.0 %    Monocytes % 12.2 (H) 4.0 - 12.0 %    Eosinophils % 0.1 0.0 - 7.0 %    Basophils % 0.3 0.0 - 2.0 %    Neutrophils Absolute 19.1 (H) 1.6 - 7.3 x10e3/mcL    Lymphocytes Absolute 1.6 1.0 - 3.2 x10e3/mcL    Monocytes Absolute 3.0 (H) 0.3 - 1.0 x10e3/mcL    Eosinophils Absolute 0.0 0.0 - 0.5 x10e3/mcL    Basophils Absolute 0.1 0.0 - 0.2 x10e3/mcL    Immature Granulocytes % 2.6 (H) 0.0 - 0.6 %    Immature Grans (Abs) 0.64 (H) 0.00 - 0.06 x10e3/mcL   Comprehensive Metabolic Panel    Collection Time: 01/01/23  4:51 AM   Result Value Ref Range    Sodium 128 (L) 135 - 145 mmol/L    Potassium 4.3 3.5 - 5.3 mmol/L    Chloride 96 (L) 98 - 107 mmol/L    CO2 19 (L) 22 - 29 mmol/L    Glucose 93 70 - 99 mg/dL    BUN 11 8 - 23 mg/dL    Creatinine 0.5 (L) 0.7 - 1.3 mg/dL    Anion Gap 13 2 - 17 mmol/L    Osmolaliy Calculated 256 (L) 270 - 287 mOsm/kg    Calcium 7.6 (L) 8.5 - 10.7 mg/dL    CALCIUM,CORRECTED,CCA 9.4 8.5 - 10.7 mg/dL    Total Protein 5.6 (L) 5.7 - 8.3 g/dL    Albumin 1.8 (L) 3.5 - 5.2 g/dL    Globulin 3.8 1.9 - 4.4 g/dL    Albumin/Globulin Ratio 0.50 (L) 1.00 - 2.70    Total Bilirubin 1.04 0.00 - 1.20 mg/dL    Alk Phosphatase 660 (H) 40 - 130 unit/L    AST 153 (H) 0 - 50 unit/L    ALT 23 0 - 50 unit/L    Est, Glom Filt Rate 115 >=60 mL/min/1.22m   Lactic Acid    Collection Time: 01/01/23  4:51 AM   Result Value Ref Range    Lactic Acid 3.1 (HH) 0.5 - 2.0 mmol/L   Lactic Acid    Collection Time: 01/01/23 12:09 PM   Result Value Ref Range    Lactic Acid 3.7 (HH) 0.5 - 2.0 mmol/L        Assessment and Plan  Stage IV gastric squamous cell cancer  Unfortunately, recent CT imaging shows progression of disease following 3 cycles of FOLFOX + nivolumab.    Upon  discharge from the hospital, we will discuss second line therapy which may consist of Herceptin given that the disease is HER-2  positive.      Preantineoplastic cardiac screening  Echocardiogram performed yesterday, results pending.      Peritonitis  7/22 CT abdomen pelvis was concerning for peritoneal thickening concerning for peritonitis.  He continues on empiric Zosyn, vancomycin discontinued  C. difficile studies were fortunately negative  Blood and urine cultures no growth to date  Continue pain management per primary team     Cytopenias attributable to Chemotherapy  Transfuse 1 unit PRBC for Hgb <7  Transfuse 1 unit PLTS for Plt <10       Jesse Savage, ACNP    Attestation:  I interviewed and examined the patient on rounds. I formulated the assessment and plan as outlined above.  I discussed the plan of care with the patient at bedside. Status remains quite tenuous. Continue supportive care.    Jesse Savage., M.D.       Elements of this note have been dictated using speech recognition software. As a result, errors of speech recognition may have occurred.

## 2023-01-02 LAB — MANUAL DIFFERENTIAL
Basophils %: 1 % (ref 0–2)
Basophils Absolute: 0.3 10*3/uL — ABNORMAL HIGH (ref 0.0–0.2)
Lymphocytes Absolute: 0.5 10*3/uL — ABNORMAL LOW (ref 1.0–3.2)
Lymphocytes: 2 % — ABNORMAL LOW (ref 15–45)
Monocytes %: 3 % — ABNORMAL LOW (ref 4–12)
Monocytes Absolute: 0.8 10*3/uL (ref 0.3–1.0)
Myelocyte Percent: 1 %
Myelocytes Absolute: 0.3 10*3/uL
Neutrophils %. Manual count: 93 % — ABNORMAL HIGH (ref 42–74)
Neutrophils Absolute: 23.7 10*3/uL — ABNORMAL HIGH (ref 1.6–7.3)
Platelet Estimate: ADEQUATE
RBC Morphology: ABNORMAL — AB

## 2023-01-02 LAB — CBC WITH AUTO DIFFERENTIAL
Hematocrit: 30.2 % — ABNORMAL LOW (ref 38.0–52.0)
Hemoglobin: 8.7 g/dL — ABNORMAL LOW (ref 13.0–17.3)
MCH: 23.6 pg — ABNORMAL LOW (ref 27.0–34.5)
MCHC: 28.8 g/dL — ABNORMAL LOW (ref 30.0–36.0)
MCV: 82.1 fL — ABNORMAL LOW (ref 84.0–100.0)
MPV: 9.3 fL (ref 7.0–12.2)
NRBC Absolute: 0 10*3/uL (ref 0.000–0.012)
NRBC Automated: 0 % (ref 0.0–0.2)
Platelets: 257 10*3/uL (ref 140–440)
RBC: 3.68 x10e6/mcL — ABNORMAL LOW (ref 4.00–5.60)
RDW: 21.5 % — ABNORMAL HIGH (ref 10.0–17.0)
WBC: 25.5 10*3/uL — ABNORMAL HIGH (ref 3.8–10.6)

## 2023-01-02 LAB — COMPREHENSIVE METABOLIC PANEL
ALT: 23 U/L (ref 0–50)
AST: 163 U/L — ABNORMAL HIGH (ref 0–50)
Albumin/Globulin Ratio: 0.4 — ABNORMAL LOW (ref 1.00–2.70)
Albumin: 1.6 g/dL — ABNORMAL LOW (ref 3.5–5.2)
Alk Phosphatase: 601 U/L — ABNORMAL HIGH (ref 40–130)
Anion Gap: 15 mmol/L (ref 2–17)
BUN: 12 mg/dL (ref 8–23)
CALCIUM,CORRECTED,CCA: 9.6 mg/dL (ref 8.5–10.7)
CO2: 16 mmol/L — ABNORMAL LOW (ref 22–29)
Calcium: 7.7 mg/dL — ABNORMAL LOW (ref 8.5–10.7)
Chloride: 96 mmol/L — ABNORMAL LOW (ref 98–107)
Creatinine: 0.4 mg/dL — ABNORMAL LOW (ref 0.7–1.3)
Est, Glom Filt Rate: 123 mL/min/1.73m?? (ref >=60)
Globulin: 3.9 g/dL (ref 1.9–4.4)
Glucose: 88 mg/dL (ref 70–99)
Osmolaliy Calculated: 254 mOsm/kg — ABNORMAL LOW (ref 270–287)
Potassium: 4.3 mmol/L (ref 3.5–5.3)
Sodium: 127 mmol/L — ABNORMAL LOW (ref 135–145)
Total Bilirubin: 1.22 mg/dL — ABNORMAL HIGH (ref 0.00–1.20)
Total Protein: 5.5 g/dL — ABNORMAL LOW (ref 5.7–8.3)

## 2023-01-02 LAB — PHOSPHORUS: Phosphorus: 2.1 mg/dL — ABNORMAL LOW (ref 2.5–4.5)

## 2023-01-02 LAB — MAGNESIUM: Magnesium: 2.2 mg/dL (ref 1.6–2.6)

## 2023-01-02 MED ORDER — OXYCODONE-ACETAMINOPHEN 10-325 MG PO TABS
10-325 | ORAL_TABLET | Freq: Three times a day (TID) | ORAL | 0 refills | Status: DC | PRN
Start: 2023-01-02 — End: 2023-01-08

## 2023-01-02 MED ORDER — DOXYCYCLINE HYCLATE 100 MG PO TABS
100 MG | ORAL_TABLET | Freq: Two times a day (BID) | ORAL | 0 refills | Status: DC
Start: 2023-01-02 — End: 2023-01-08

## 2023-01-02 MED ORDER — AMOXICILLIN-POT CLAVULANATE 875-125 MG PO TABS
875-125 MG | ORAL_TABLET | Freq: Two times a day (BID) | ORAL | 0 refills | Status: DC
Start: 2023-01-02 — End: 2023-01-08

## 2023-01-02 MED FILL — MORPHINE SULFATE (PF) 4 MG/ML IJ SOLN: 4 MG/ML | INTRAMUSCULAR | Qty: 1

## 2023-01-02 MED FILL — OXYCONTIN 20 MG PO T12A: 20 MG | ORAL | Qty: 1

## 2023-01-02 MED FILL — PIPERACILLIN SOD-TAZOBACTAM SO 3.375 (3-0.375) G IV SOLR: 3.375 (3-0.375) g | INTRAVENOUS | Qty: 3375

## 2023-01-02 MED FILL — NORMAL SALINE FLUSH 0.9 % IV SOLN: 0.9 % | INTRAVENOUS | Qty: 10

## 2023-01-02 MED FILL — OXYCODONE HCL 5 MG PO TABS: 5 MG | ORAL | Qty: 2

## 2023-01-02 MED FILL — POLYETHYLENE GLYCOL 3350 17 G PO PACK: 17 g | ORAL | Qty: 1

## 2023-01-02 MED FILL — MORPHINE SULFATE (PF) 4 MG/ML IJ SOLN: 4 mg/mL | INTRAMUSCULAR | Qty: 1

## 2023-01-02 MED FILL — PANTOPRAZOLE SODIUM 40 MG PO TBEC: 40 MG | ORAL | Qty: 1

## 2023-01-02 MED FILL — DOCUSATE SODIUM 100 MG PO CAPS: 100 MG | ORAL | Qty: 1

## 2023-01-02 MED FILL — SENOKOT 8.6 MG PO TABS: 8.6 MG | ORAL | Qty: 2

## 2023-01-02 MED FILL — NORMAL SALINE FLUSH 0.9 % IV SOLN: 0.9 % | INTRAVENOUS | Qty: 40

## 2023-01-02 MED FILL — FUROSEMIDE 20 MG PO TABS: 20 MG | ORAL | Qty: 1

## 2023-01-02 NOTE — Care Coordination-Inpatient (Signed)
01/02/23:  BMP CM Discharge Note:  Discharge to home today with [facility], RN and PT.  Pt agrees to d/c plan/discharge IM signed.  Spouse to transport home today.  Staff RN/charge RN updated on completed CM d/c plan.   01/02/23 1419   Discharge Planning   Type of Residence House   Living Arrangements Spouse/Significant Other   Current Services Prior To Admission None   Potential Assistance Needed N/A   DME Ordered? No   Potential Assistance Purchasing Medications No   Type of Home Care Services Skilled Therapy;PT   Patient expects to be discharged to: House   History of falls? 0   Services At/After Discharge   Transition of Care Consult (CM Consult) Home Health   Internal Home Health Yes  (Pt chose [facility])   Services At/After Discharge Home Health

## 2023-01-02 NOTE — Progress Notes (Signed)
01/02/23 1601   Encounter Summary   Encounter Overview/Reason Spiritual/Emotional Needs   Service Provided For Patient   Referral/Consult From Rounding   Support System Family members;Palliative Care   Last Encounter  01/02/23   Spiritual/Emotional needs   Type Spiritual Support   Grief, Loss, and Adjustments   Type Adjustment to illness;Anticipatory Grief     Had a lengthy visit w/ pt when rounding on 5 south. He was eager to reflect about theological themes including the afterlife and forgiveness. Pt shared about his recent diagnosis and shared that he's "actually looking forward" to end of life, as he will have the opportunity to spend time with God and be enlightened about what lies beyond this life. He expressed feeling content w/ the full life he has led. He shared that, despite his peace with end of life, he wants to extend his life as long as he can, as he worries about his wife's coping.     Pt reflected upon his life and his family. He was incredibly grateful for the opportunity to visit and verbally process. I provided empathic listening, affirmed his emotions, facilitated reflection about his resources for support/coping, and provided informal prayer. Pt anticipates returning home today, though is grateful to know that [facility] services are available here should he need further support. Chaplains remain available to offer further support and can be reached via Telemediq.      [facility] Pastoral Care [Mon-Friday  8 AM-4 PM]   and   AH_Pastoral Care/Chaplain (After hours and Weekends)     Chaplain Cameron Ali, MDiv, [facility]  Pastoral Care

## 2023-01-02 NOTE — Progress Notes (Signed)
Subjective  Jesse Savage was seen and examined this morning.  Jesse Savage states that Jesse Savage rested well last night.  Jesse Savage remains afebrile.  His pain has been better controlled with roxicodone as needed in addition to scheduled Oxycontin.  Jesse Savage had a bowel movement yesterday.  Jesse Savage denies nausea or vomiting.  Jesse Savage is asking when Jesse Savage will be discharged and hopes it will be today.  A comprehensive review of systems is negative.     ONCOLOGY HISTORY    Metastatic gastric cancer, squamous cell differentiation-PD-L1 positive, HER2 positive  Originally presenting to the emergency department with a 20 pound weight loss, abdominal discomfort, and low grade fevers  CT abdomen/pelvis 10/25/22: There is a gastric mass at the gastric fundus measuring approximately 4.2 x 4.0 cm in size with multiple abnormal hepatic masses, multiple abnormally enlarged   lymph nodes, and multiple abnormal pulmonary nodules likely representing   extensive metastatic disease.  CTA chest 10/25/22: No evidence of acute pulmonary embolism as clinically queried. No pneumonia. No pleural effusion. No pneumothorax. Multiple bilateral pulmonary nodules with multiple hepatic lesions most likely representing extensive metastatic disease.  5/22  PET/CT 10/31/22: Intensely hypermetabolic gastric cardia mass with associated regional   hypermetabolic lymphadenopathy and more distant, fairly extensive pulmonary and   hepatic metastases.  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, squamous cell  10/31/22 IR Port placement  11/10/22 cycle 1 day 1 FOLFOX  11/24/22 added Nivolumab   12/16/22 CT abdomen/pelvis: Progression of neoplasm at all pre-existing sites, multiple coiling lesions in the lung bases, dominant left-sided lesion 3.3 cm previously 2.4 cm, extensive intrahepatic metastatic burden also increased, representative lesion 5 cm previously 4 cm, increased size of bulky low-density upper abdominal  adenopathy including periaortic distribution, prominent gastric cardia mass increased in prominence    Objective      Vitals & Measurements  VITALS:  BP 104/74   Pulse (!) 103   Temp 97.6 F (36.4 C) (Oral)   Resp 18   Ht 1.753 m (5' 9.02")   Wt 65.8 kg (145 lb)   SpO2 99%   BMI 21.40 kg/m     24HR INTAKE/OUTPUT:    Intake/Output Summary (Last 24 hours) at 01/02/2023 0802  Last data filed at 01/02/2023 0636  Gross per 24 hour   Intake --   Output 975 ml   Net -975 ml       Physical Exam  Constitutional: Chronically ill appearing male sitting up in bed, temporal wasting evident  Eyes:  Conjunctiva normal; eyelids normal; PERRLA; sclera anicteric  Ear, Nose, Throat:  External ears and nose normal; hearing grossly normal; poor dentition  Neck:  Supple; no masses; no adenopathy  Respiratory: CTAB, symmetric chest rise  Cardiovascular:  Normal heart sounds; regular rate and rhythm; 2+ bilateral lower extremity edema, left greater than right   Abdomen: Abdomen distended, hypoactive bowel sounds, mildly tender   Skin:  No rash; no petechiae; no ecchymoses; pallor; no cutaneous nodules;  Musculoskeletal:  Generalized weakness  Neurologic:  No focal motor or sensory deficit; gait not assessed; no abnormal mental status  Psychiatric:  Oriented to person time and place; mood and affect appropriate to situation; appropriate judgment and insight; memory intact  Access: Right upper chest port-not accessed    Lab Results  Recent Results (from the past 24 hour(s))   CBC with Auto Differential    Collection Time: 01/02/23  7:21 AM   Result Value Ref Range  WBC 25.5 (H) 3.8 - 10.6 x10e3/mcL    RBC 3.68 (L) 4.00 - 5.60 x10e6/mcL    Hemoglobin 8.7 (L) 13.0 - 17.3 g/dL    Hematocrit 13.0 (L) 38.0 - 52.0 %    MCV 82.1 (L) 84.0 - 100.0 fL    MCH 23.6 (L) 27.0 - 34.5 pg    MCHC 28.8 (L) 30.0 - 36.0 g/dL    RDW 86.5 (H) 78.4 - 17.0 %    Platelets 257 140 - 440 x10e3/mcL    MPV 9.3 7.0 - 12.2 fL    NRBC Automated 0.0 0.0 - 0.2 %     NRBC Absolute 0.000 0.000 - 0.012 x10e3/mcL   Comprehensive Metabolic Panel    Collection Time: 01/02/23  7:21 AM   Result Value Ref Range    Sodium 127 (L) 135 - 145 mmol/L    Potassium 4.3 3.5 - 5.3 mmol/L    Chloride 96 (L) 98 - 107 mmol/L    CO2 16 (L) 22 - 29 mmol/L    Glucose 88 70 - 99 mg/dL    BUN 12 8 - 23 mg/dL    Creatinine 0.4 (L) 0.7 - 1.3 mg/dL    Anion Gap 15 2 - 17 mmol/L    Osmolaliy Calculated 254 (L) 270 - 287 mOsm/kg    Calcium 7.7 (L) 8.5 - 10.7 mg/dL    CALCIUM,CORRECTED,CCA 9.6 8.5 - 10.7 mg/dL    Total Protein 5.5 (L) 5.7 - 8.3 g/dL    Albumin 1.6 (L) 3.5 - 5.2 g/dL    Globulin 3.9 1.9 - 4.4 g/dL    Albumin/Globulin Ratio 0.40 (L) 1.00 - 2.70    Total Bilirubin 1.22 (H) 0.00 - 1.20 mg/dL    Alk Phosphatase 696 (H) 40 - 130 unit/L    AST 163 (H) 0 - 50 unit/L    ALT 23 0 - 50 unit/L    Est, Glom Filt Rate 123 >=60 mL/min/1.53m   Magnesium    Collection Time: 01/02/23  7:21 AM   Result Value Ref Range    Magnesium 2.2 1.6 - 2.6 mg/dL   Phosphorus    Collection Time: 01/02/23  7:21 AM   Result Value Ref Range    Phosphorus 2.1 (L) 2.5 - 4.5 mg/dL   Manual Differential    Collection Time: 01/02/23  7:21 AM   Result Value Ref Range    Neutrophils %. Manual count 93 (H) 42 - 74 %    Lymphocytes 2 (L) 15 - 45 %    Monocytes % 3 (L) 4 - 12 %    Basophils % 1 0 - 2 %    Myelocyte Percent 1 %    Neutrophils Absolute 23.7 (H) 1.6 - 7.3 x10e3/mcL    Lymphocytes Absolute 0.5 (L) 1.0 - 3.2 x10e3/mcL    Monocytes Absolute 0.8 0.3 - 1.0 x10e3/mcL    Basophils Absolute 0.3 (H) 0.0 - 0.2 x10e3/mcL    Myelocytes Absolute 0.3 x10e3/mcL    Platelet Estimate Adequate     RBC Morphology Abnormal (A) Normal    Anisocytosis Few (A)     Ovalocytes Few (A)         Assessment and Plan    Stage IV gastric squamous cell cancer  Recent CT imaging shows progression of disease following 3 cycles of FOLFOX + nivolumab.    Okay for discharge from an oncology standpoint today if medically stable  Jesse Savage has follow up scheduled in  our [facility] on Monday.  We will discuss second line therapy which will likely include a taxane and herceptin    Preantineoplastic cardiac screening  Echocardiogram 7/25 LVEF 60-65%     Peritonitis  7/22 CT abdomen pelvis was concerning for peritoneal thickening concerning for peritonitis.  C. difficile studies negative  Blood and urine cultures no growth to date  Plans noted to transition to oral antibiotics per the primary team    Pain of metastatic disease  Pain appears to be better controlled today  Continue pain management per primary team, currently taking oxycontin 20mg  BID and roxicodone for breakthrough pain     Cytopenias attributable to Chemotherapy  Transfuse 1 unit PRBC for Hgb <7  Transfuse 1 unit PLTS for Plt <10     Yvonne Kendall, APRN - NP    Attestation:  I interviewed and examined the patient on rounds. I formulated the assessment and plan as outlined above.  I discussed the plan of care with the patient at bedside. Will f/u next week, start salvage therapy.     Evalyn Casco., M.D.   Elements of this note have been dictated using speech recognition software. As a result, errors of speech recognition may have occurred.

## 2023-01-02 NOTE — Care Coordination-Inpatient (Signed)
12/30/22-01/02/23:   BMP  CM Note.  Potential discharge to home today with spouse/CM following with Dr Clemon Chambers for potential chemo treatment while in [facility].  Referred to [facility] in Epic/per pt and spouse/they are 'asking friends' for [facility] suggestions.  Pt and family request CM to come back to room 'closer to discharge' to discuss specific HH company.  Staff RN and charge RN aware that CM is following for completed d/c plan.        12/29/22:  BMP  CM Note.  IA/IM completed.  Admit:  fever, weakness, history gastric cancer.  IADLs, with 'some' assistance on a prn basis from spouse.  Pt agrees CM to follow for d/c planning.   12/30/22 1514   Service Assessment   Patient Orientation Alert and Oriented;Person;Place;Situation   Cognition Alert   History Provided By Patient   Primary Caregiver Self   Support Systems Spouse/Significant Other;Family Members   PCP Verified by CM Yes  (Dr Eduardo Osier)   Last Visit to PCP Within last 6 months   Prior Functional Level Assistance with the following:;Cooking;Mobility  (transportation)   Current Functional Level   (as above)   Can patient return to prior living arrangement Yes   Would you like for me to discuss the discharge plan with any other family members/significant others, and if so, who? Yes  (spouse:  Ms Shellhammer    [phone])   Financial Resources [employer] Resources None   Social/Functional History   Lives With Spouse   Type of Home House   Home Layout One level   Home Access Stairs to enter with rails   Entrance Stairs - Number of Steps 3   Entrance Stairs - Rails Left   Bathroom Shower/Tub Tub/Shower unit   Management consultant - 4-Wheeled   Receives Help From Family   ADL Assistance Independent   Homemaking Assistance Independent   Ambulation Assistance Independent   Transfer Assistance Independent   Active Driver No   Patient's Driver Info spouse asssits   Mode of  Engineer, water   Occupation Retired   Type of Occupation Pipe/steam fitter

## 2023-01-02 NOTE — Discharge Summary (Incomplete)
[facility] Hospitalist Service    Discharge Summary     Patient ID:  Jesse Savage [id number] y.o. 07-07-1960    Admit date: 12/29/2022  Discharge date and time: No discharge date for patient encounter.   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 [facility] Course     62 year old male with past medical history of stage IV gastric carcinoma with diffuse metastasis, presenting to ED with generalized weakness and fatigue as well as hypotension and tachycardia.  Patient seen and evaluated at Dr.Geil's office as an unscheduled walk-in due to symptoms.  He describes primary worsening symptoms as being "winded".  He states he has been able to only ambulate short distances without becoming acutely short of breath.  Noted bilateral calf pain.  Left hand tremor.  Has had low-grade fever, diaphoresis, chills.  Has had abdominal distention without appreciable nausea vomiting or diarrhea.  No pleuritic chest pain, cough, sputum production.  No rashes or skin breakdown.  Seen and evaluated in ED after office evaluation by oncology and found to be relatively hypotensive with MAP responding from 70s to 83 after additional volume.  Noted to have lactic acidemia which improved after volume resuscitation and empiric antibiotic therapy with Zosyn.  With concern regarding SIRS with underlying immunosuppression from active gastric carcinoma, was referred for inpatient admission and continued medical stabilization.     Sepsis present on admission associated with stage IV gastric carcinoma-concern with lactic acidemia, hypotension, neutrophilic leukocytosis at 30.5 K on  presentation -CT of chest abdomen pelvis performed with no recognized pulmonary embolus or appreciable pulmonary infiltrate with patient's concern for worsening dyspnea on exertion.  -By abdominopelvic CT imaging has concern for peritoneal thickening concerning for early peritonitis.  -Will continue empiric Zosyn.  DC vanc.  Blood culture and urine culture ngtd   DC IVF with increased swelling      Stage IV gastric carcinoma squamous   -oncology and palliative following      Constipation - increase bowel regimen     OSA  -CPAP intolerant.     Calf discomfort  - had previous lower extremity provoked DVT.  DVT study with superficial thrombophlebitis and right small saphenous vein, negative for DVT     Anemia, mixed IDA and chronic disease  -Hemoglobin 6.8. 7/23. Eventually agreeable to blood transfusion, received 1 unit. No evidence of blood loss     Severe malnutrition - RD following     Recent Labs: CBC:   Lab Results   Component Value Date/Time    WBC 25.5 01/02/2023 07:21 AM    RBC 3.68 01/02/2023 07:21 AM    HGB 8.7 01/02/2023 07:21 AM    HCT 30.2 01/02/2023 07:21 AM    MCV 82.1 01/02/2023 07:21 AM    MCH 23.6 01/02/2023 07:21 AM    MCHC 28.8 01/02/2023 07:21 AM    RDW 21.5 01/02/2023 07:21 AM    PLT 257 01/02/2023 07:21 AM     BMP:    Lab Results   Component Value Date/Time  GLUCOSE 88 01/02/2023 07:21 AM    NA 127 01/02/2023 07:21 AM    K 4.3 01/02/2023 07:21 AM    CL 96 01/02/2023 07:21 AM    CO2 16 01/02/2023 07:21 AM    ANIONGAP 15 01/02/2023 07:21 AM    BUN 12 01/02/2023 07:21 AM    CREATININE 0.4 01/02/2023 07:21 AM    CALCIUM 7.7 01/02/2023 07:21 AM    LABGLOM 123 01/02/2023 07:21 AM    LABGLOM 101 04/09/2021 12:58 PM    GFRAA 113 09/05/2019 09:24 AM       Radiology Last 7 Days:  CT ABDOMEN PELVIS W IV CONTRAST Additional Contrast? None    Result Date: 12/29/2022  1.  Redemonstrated soft tissue lesion at the GE junction and gastric cardia, not  significantly changed. Redemonstrated metastatic disease  with no significant progression in hepatic disease burden or upper abdominal lymphadenopathy. Partially visualized metastases in the lung bases, some of which have increased in size (more completely evaluated on the chest CT from earlier today). 2.  Small volume abdominopelvic free fluid with findings concerning for peritoneal thickening and enhancement. 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 3.  Moderately large stool burden, consistent with constipation. 4.  Degenerative and postoperative changes of the lumbar spine, as above.    CT CHEST W CONTRAST    Result Date: 12/29/2022  Significant worsening of pulmonary metastatic disease. Little change or some worsening of hepatic metastases from 12/16/2022. No change appreciated in extensive adenopathy in the upper abdomen or mass in the gastric cardia.      Discharge Exam:  Blood pressure 104/74, pulse (!) 103, temperature 97.6 F (36.4 C), temperature source Oral, resp. rate 18, height 1.753 m (5' 9.02"), weight 65.8 kg (145 lb), SpO2 99 %.   Body mass index is 21.4 kg/m.    General: chronically ill appearing    Cardiac: Tachycardic, regular, mild LE edema.    Pulmonary: clear to auscultation bilaterally, no respiratory distress  GI: soft, non-tender, no rebound or guarding, distended, active bowel sounds  MSK: cachectic   Skin: no rashes or lesion, dry and warm   Neuro: no confusion, strength symmetric bilaterally, no numbness in extremities        Discharge Plan   Disposition: Home with Home Health Services    Provider Follow-Up:   [facility])  [phone] [place] [address]  [phone]        Fulton Mole, MD  [address]  [place] [address]  [phone]    Schedule an appointment as soon as possible for a visit in 1 week(s)      Evalyn Casco., MD  [address]  [place]  [address]  [phone]    Schedule an appointment as soon as possible for a visit in 1 week(s)         In process/preliminary results:  Outstanding Order Results       Date and Time Order Name Status Description    12/30/2022  5:50 PM Lactic Acid In process     12/30/2022 12:22 PM ANTIBODY SCREEN In process     12/30/2022 12:22 PM ABO/RH In process     12/30/2022 12:22 PM TYPE AND SCREEN In process     12/30/2022  7:37 AM Add On Lab Test In process     12/30/2022  7:37 AM Add On Lab Test  In process     12/29/2022  6:29 PM Blood Culture 1 Preliminary     12/29/2022  6:24 PM Add On Lab Test In process     12/29/2022  5:40 PM EKG 12 Lead Preliminary             Patient Instructions   Diet: regular diet  Activity: activity as tolerated    Discharge Medications         Medication List        START taking these medications      amoxicillin-clavulanate 875-125 MG per tablet  Commonly known as: AUGMENTIN  Take 1 tablet by mouth 2 times daily for 7 days     doxycycline hyclate 100 MG tablet  Commonly known as: VIBRA-TABS  Take 1 tablet by mouth 2 times daily for 7 days     oxyCODONE-acetaminophen 10-325 MG per tablet  Commonly known as: Percocet  Take 1 tablet by mouth every 8 hours as needed for Pain for up to 30 doses. Intended supply: 30 days Max Daily Amount: 3 tablets  Replaces: oxyCODONE-acetaminophen 7.5-325 MG per tablet            CONTINUE taking these medications      Aleve 220 MG Caps  Generic drug: Naproxen Sodium     Docusate Sodium 100 MG Tabs     Handicap Placard Misc  by Does not apply route     Magic Mouthwash  Commonly known as: Miracle Mouthwash  Swish and spit 5 mLs 4 times daily as needed for Irritation Diphenhyddramine: nystatin: lidocaine: maalox     MIRALAX PO     omeprazole 40 MG delayed release capsule  Commonly known as: PRILOSEC  Take 1 capsule by mouth daily     ondansetron 8 MG Tbdp disintegrating tablet  Commonly known as: ZOFRAN-ODT  Take 1 tablet by mouth every 8 hours as needed for Nausea or Vomiting  (nausea, vomiting)     oxyCODONE 20 MG extended release tablet  Commonly known as: OxyCONTIN  Take 1 tablet by mouth 2 times daily for 30 days. Intended supply: 30 days Max Daily Amount: 40 mg     promethazine 25 MG tablet  Commonly known as: PHENERGAN  Take 1 tablet by mouth every 6 hours as needed for Nausea     simethicone 80 MG chewable tablet  Commonly known as: MYLICON            STOP taking these medications      oxyCODONE-acetaminophen 7.5-325 MG per tablet  Commonly known as: PERCOCET  Replaced by: oxyCODONE-acetaminophen 10-325 MG per tablet               Where to Get Your Medications        These medications were sent to [facility] - [place], [place] - [address] - [place] [phone] - F [phone]  [address] [place] [address]      Phone: [phone]   amoxicillin-clavulanate 875-125 MG per tablet  doxycycline hyclate 100 MG tablet  oxyCODONE-acetaminophen 10-325 MG per tablet         Discharge Medications  (summarized)     Current Outpatient Medications   Medication Instructions    amoxicillin-clavulanate (AUGMENTIN) 875-125 MG per tablet 1 tablet, Oral, 2 TIMES DAILY    Docusate Sodium 100 MG TABS 1 tablet, Oral, 2 TIMES DAILY    doxycycline hyclate (VIBRA-TABS) 100 mg, Oral, 2 TIMES DAILY    Handicap Placard MISC Does not apply  Magic Mouthwash (MIRACLE MOUTHWASH) 5 mLs, Swish & Spit, 4 TIMES DAILY PRN, Diphenhyddramine: nystatin: lidocaine: maalox    Naproxen Sodium (ALEVE) 220 MG CAPS 1 tablet, Oral, 2 TIMES DAILY    omeprazole (PRILOSEC) 40 mg, Oral, DAILY    ondansetron (ZOFRAN-ODT) 8 mg, Oral, EVERY 8 HOURS PRN    oxyCODONE (OXYCONTIN) 20 mg, Oral, 2 TIMES DAILY, Intended supply: 30 days    oxyCODONE-acetaminophen (PERCOCET) 10-325 MG per tablet 1 tablet, Oral, EVERY 8 HOURS PRN, Intended supply: 30 days    Polyethylene Glycol 3350 (MIRALAX PO) 17 g, Oral, 2 TIMES DAILY    promethazine (PHENERGAN) 25 mg, Oral, EVERY 6 HOURS PRN    simethicone (MYLICON) 80 MG  chewable tablet 1 capsule, Oral, 2 TIMES DAILY       Time Spent on Discharge:  55 minutes were spent on preparing this discharge.  Greater than 50% of this time was spent on direct patient care, or coordination of that care.         Electronically signed by Lorna Few, MD on 01/02/23 at 10:24 AM EDT

## 2023-01-03 ENCOUNTER — Ambulatory Visit: Payer: MEDICARE | Primary: Internal Medicine

## 2023-01-03 LAB — EKG 12-LEAD
P Axis: 62 degrees
P-R Interval: 128 ms
Q-T Interval: 296 ms
QRS Duration: 84 ms
QTc Calculation (Bazett): 368 ms
R Axis: 17 degrees
T Axis: 57 degrees
Ventricular Rate: 121 {beats}/min

## 2023-01-04 NOTE — Progress Notes (Unsigned)
Cardiology Office Note  Date:  01/05/2023   ID:  Erik Perkins, DOB 09-18-1960, MRN [id number]  PCP:  [facility], [facility]   Chief Complaint  Patient presents with   12 month follow up     "Doing well." Medications reviewed by the patient verbally.     HPI:  Erik Perkins is a very pleasant 62 year old gentleman with a long history of  Former smoker 11/2009 hyperlipidemia,  hypertension  [facility] on January 21 2010 with stuttering worsening chest discomfort,  troponin elevation 0.33,  cardiac catheterization showing severe 99% distal RCA disease.  DES stent was placed . He presents for routine followup of his coronary artery disease, hyperlipidemia,  hypotension  Last seen by myself in clinic May 2023 Weight down 10 pounds past 6 months Exercising, goes fishing, kayaking  Retired from [employer] nitrogen every other week part-time  No regular exercise program Denies chest pain or shortness of breath on exertion  Blood pressure low, denies orthostasis symptoms  Lab work reviewed from the [facility], July 2024 hemoglobin A1c 5.8 Total cholesterol 137 Direct LDL 80  EKG personally reviewed by myself on today's visit EKG Interpretation Date/Time:  Monday January 05 2023 07:56:46 EDT Ventricular Rate:  55 PR Interval:  160 QRS Duration:  74 QT Interval:  416 QTC Calculation: 397 R Axis:   6  Text Interpretation: Sinus bradycardia When compared with ECG of 17-Jan-2019 18:21, No significant change was found Confirmed by Erik Perkins [phone]) on 01/05/2023 8:11:50 AM    Other past medical history reviewed Cardiac catheterization showed 99% distal RCA disease, moderate to severe proximal RCA disease estimated at 60-70%, mild diffuse LAD and left circumflex disease, moderate ostial D2 and D3 disease, moderate distal inferior wall hypokinesis on LV gram. A Xience 3.5 x 12 mm DES stent was placed to his distal RCA.}  PMH:   has a past medical history of Coronary  artery disease, GERD (gastroesophageal reflux disease), Hyperlipidemia, Hypertension, and MI (myocardial infarction) (HCC).  PSH:    Past Surgical History:  Procedure Laterality Date   CARDIAC CATHETERIZATION     FRACTURE SURGERY Right    wrist  62yo   SHOULDER ARTHROSCOPY WITH OPEN ROTATOR CUFF REPAIR AND DISTAL CLAVICLE ACROMINECTOMY Left 10/23/2020   Procedure: LEFT SHOULDER ARTHROSCOPY, DEBRIDEMENT, MINI OPEN ROTATOR CUFF TEAR REPAIR, BICEPS TENODESIS;  Surgeon: Cammy Copa, MD;  Location: [facility] OR;  Service: Orthopedics;  Laterality: Left;   VASECTOMY  1984    Current Outpatient Medications  Medication Sig Dispense Refill   aspirin 81 MG tablet Take 81 mg by mouth daily.     lisinopril (ZESTRIL) 5 MG tablet TAKE 1 TABLET DAILY 90 tablet 0   metoprolol tartrate (LOPRESSOR) 25 MG tablet TAKE ONE-HALF (1/2) TABLET DAILY 45 tablet 0   nitroGLYCERIN (NITROSTAT) 0.4 MG SL tablet Place 1 tablet (0.4 mg total) under the tongue every 5 (five) minutes as needed for chest pain. 25 tablet 1   omeprazole (PRILOSEC) 20 MG capsule Take 40 mg by mouth daily.     rosuvastatin (CRESTOR) 40 MG tablet Take 1 tablet (40 mg total) by mouth daily. Please call to schedule appointment prior to future refills (1st attempt) 30 tablet 0   tamsulosin (FLOMAX) 0.4 MG CAPS capsule Take 1 capsule (0.4 mg total) by mouth 2 (two) times daily.     No current facility-administered medications for this visit.    Allergies:   Aspartame, Cephradine, and Other   Social History:  The patient  reports that he quit smoking about 13 years ago. His smoking use included cigarettes. He started smoking about 38 years ago. He has a 25 pack-year smoking history. He has never used smokeless tobacco. He reports current alcohol use of about 4.0 standard drinks of alcohol per week. He reports that he does not use drugs.   Family History:   family history includes Cancer in his father; Coronary artery disease in his sister;  Diabetes in his sister; Hyperlipidemia in his father and sister.    Review of Systems: Review of Systems  Constitutional: Negative.   HENT: Negative.    Respiratory: Negative.    Cardiovascular: Negative.   Gastrointestinal: Negative.   Musculoskeletal: Negative.   Neurological: Negative.   Psychiatric/Behavioral: Negative.    All other systems reviewed and are negative.   PHYSICAL EXAM: VS:  BP 106/60 (BP Location: Left Arm, Patient Position: Sitting, Cuff Size: Normal)   Pulse (!) 55   Ht 5\' 10"  (1.778 m)   Wt 212 lb (96.2 kg)   SpO2 98%   BMI 30.42 kg/m  , BMI Body mass index is 30.42 kg/m. Constitutional:  oriented to person, place, and time. No distress.  HENT:  Head: Grossly normal Eyes:  no discharge. No scleral icterus.  Neck: No JVD, no carotid bruits  Cardiovascular: Regular rate and rhythm, no murmurs appreciated Pulmonary/Chest: Clear to auscultation bilaterally, no wheezes or rails Abdominal: Soft.  no distension.  no tenderness.  Musculoskeletal: Normal range of motion Neurological:  normal muscle tone. Coordination normal. No atrophy Skin: Skin warm and dry Psychiatric: normal affect, pleasant   Recent Labs: No results found for requested labs within last 365 days.    Lipid Panel No results found for: "CHOL", "HDL", "LDLCALC", "TRIG"    Wt Readings from Last 3 Encounters:  01/05/23 212 lb (96.2 kg)  10/29/21 222 lb 9.6 oz (101 kg)  10/18/20 218 lb 3 oz (99 kg)     ASSESSMENT AND PLAN:  Atherosclerosis of native coronary artery of native heart with angina pectoris (HCC) -  Currently with no symptoms of angina. No further workup at this time. Continue current medication regimen.  Essential hypertension Blood pressure low, denies orthostasis symptoms continue low-dose metoprolol tartrate 12.5 daily, lisinopril 5, On Flomax twice daily for BPH[facility]  Lower extremity edema likely venous insufficiency Edema on today's  visit  Smoking hx Stop smoking years ago, 2011  Hyperlipidemia Discussed LDL 80, above goal Discussed adding Zetia, he prefers to try additional weight loss at this time to see if he can achieve  Neuropathy Well-controlled diabetes On hard concrete for many years, now retired Symptoms stable   Total encounter time more than 30 minutes  Greater than 50% was spent in counseling and coordination of care with the patient    Orders Placed This Encounter  Procedures   EKG 12-Lead      Signed, Dossie Arbour, M.D., Ph.D. 01/05/2023  [facility] [place], [place] [phone]

## 2023-01-05 ENCOUNTER — Encounter: Payer: MEDICARE | Attending: Hematology & Oncology | Primary: Internal Medicine

## 2023-01-05 ENCOUNTER — Ambulatory Visit: Payer: MEDICARE | Attending: Hematology & Oncology | Primary: Internal Medicine

## 2023-01-05 ENCOUNTER — Ambulatory Visit: Payer: MEDICARE | Primary: Internal Medicine

## 2023-01-05 ENCOUNTER — Inpatient Hospital Stay: Payer: MEDICARE | Primary: Internal Medicine

## 2023-01-05 ENCOUNTER — Telehealth: Payer: Self-pay | Admitting: Emergency Medicine

## 2023-01-05 ENCOUNTER — Encounter: Payer: Self-pay | Admitting: Cardiovascular Disease

## 2023-01-05 ENCOUNTER — Ambulatory Visit: Attending: Cardiovascular Disease | Admitting: Cardiovascular Disease

## 2023-01-05 VITALS — BP 106/60 | HR 55 | Ht 70.0 in | Wt 212.0 lb

## 2023-01-05 DIAGNOSIS — I10 Essential (primary) hypertension: Secondary | ICD-10-CM

## 2023-01-05 DIAGNOSIS — Z87891 Personal history of nicotine dependence: Secondary | ICD-10-CM | POA: Diagnosis not present

## 2023-01-05 DIAGNOSIS — E782 Mixed hyperlipidemia: Secondary | ICD-10-CM | POA: Diagnosis not present

## 2023-01-05 DIAGNOSIS — I25118 Atherosclerotic heart disease of native coronary artery with other forms of angina pectoris: Secondary | ICD-10-CM | POA: Diagnosis not present

## 2023-01-05 MED ORDER — LISINOPRIL 5 MG PO TABS: 5.0000 mg | ORAL_TABLET | Freq: Every day | ORAL | 3 refills | Status: AC

## 2023-01-05 MED ORDER — METOPROLOL TARTRATE 25 MG PO TABS: 12.5000 mg | ORAL_TABLET | Freq: Every day | ORAL | 3 refills | Status: AC

## 2023-01-05 MED ORDER — ROSUVASTATIN CALCIUM 40 MG PO TABS: 40.0000 mg | ORAL_TABLET | Freq: Every day | ORAL | 3 refills | Status: AC

## 2023-01-05 NOTE — Patient Instructions (Signed)

## 2023-01-06 ENCOUNTER — Inpatient Hospital Stay
Admit: 2023-01-06 | Discharge: 2023-01-09 | Disposition: A | Discharge status: Hospice | Payer: Medicare Other | Admitting: Internal Medicine

## 2023-01-06 ENCOUNTER — Emergency Department: Payer: MEDICARE | Primary: Internal Medicine

## 2023-01-06 ENCOUNTER — Emergency Department: Admit: 2023-01-06 | Payer: MEDICARE | Primary: Internal Medicine

## 2023-01-06 DIAGNOSIS — R1084 Generalized abdominal pain: Secondary | ICD-10-CM

## 2023-01-06 DIAGNOSIS — A419 Sepsis, unspecified organism: Secondary | ICD-10-CM

## 2023-01-06 LAB — MANUAL DIFFERENTIAL
Absolute Bands: 0.2 10*3/uL
Bands Relative: 1 % (ref 0–5)
Lymphocytes Absolute: 1.4 10*3/uL (ref 1.0–3.2)
Lymphocytes: 6 % — ABNORMAL LOW (ref 15–45)
Monocytes %: 8 % (ref 4–12)
Monocytes Absolute: 1.8 10*3/uL — ABNORMAL HIGH (ref 0.3–1.0)
Neutrophils %. Manual count: 85 % — ABNORMAL HIGH (ref 42–74)
Neutrophils Absolute: 19.3 10*3/uL — ABNORMAL HIGH (ref 1.6–7.3)
Platelet Estimate: ADEQUATE
RBC Morphology: ABNORMAL — AB

## 2023-01-06 LAB — CBC WITH AUTO DIFFERENTIAL
Hematocrit: 33.4 % — ABNORMAL LOW (ref 38.0–52.0)
Hemoglobin: 10.3 g/dL — ABNORMAL LOW (ref 13.0–17.3)
MCH: 24.9 pg — ABNORMAL LOW (ref 27.0–34.5)
MCHC: 30.8 g/dL (ref 30.0–36.0)
MCV: 80.9 fL — ABNORMAL LOW (ref 84.0–100.0)
MPV: 10.4 fL (ref 7.0–12.2)
NRBC Absolute: 0.02 10*3/uL — ABNORMAL HIGH (ref 0.000–0.012)
NRBC Automated: 0.1 % (ref 0.0–0.2)
Platelets: 284 10*3/uL (ref 140–440)
RBC: 4.13 x10e6/mcL (ref 4.00–5.60)
RDW: 24.6 % — ABNORMAL HIGH (ref 10.0–17.0)
WBC: 22.7 10*3/uL — ABNORMAL HIGH (ref 3.8–10.6)

## 2023-01-06 LAB — COMPREHENSIVE METABOLIC PANEL
ALT: 50 U/L (ref 0–50)
AST: 306 U/L — ABNORMAL HIGH (ref 0–50)
Albumin/Globulin Ratio: 0.4 — ABNORMAL LOW (ref 1.00–2.70)
Albumin: 1.7 g/dL — ABNORMAL LOW (ref 3.5–5.2)
Alk Phosphatase: 649 U/L — ABNORMAL HIGH (ref 40–130)
Anion Gap: 14 mmol/L (ref 2–17)
BUN: 21 mg/dL (ref 8–23)
CALCIUM,CORRECTED,CCA: 9.4 mg/dL (ref 8.5–10.7)
CO2: 18 mmol/L — ABNORMAL LOW (ref 22–29)
Calcium: 7.6 mg/dL — ABNORMAL LOW (ref 8.5–10.7)
Chloride: 93 mmol/L — ABNORMAL LOW (ref 98–107)
Creatinine: 0.4 mg/dL — ABNORMAL LOW (ref 0.7–1.3)
Est, Glom Filt Rate: 123 mL/min/1.73m (ref >=60)
Globulin: 4.5 g/dL — ABNORMAL HIGH (ref 1.9–4.4)
Glucose: 95 mg/dL (ref 70–99)
Osmolaliy Calculated: 254 mOsm/kg — ABNORMAL LOW (ref 270–287)
Sodium: 125 mmol/L — ABNORMAL LOW (ref 135–145)
Total Bilirubin: 1.59 mg/dL — ABNORMAL HIGH (ref 0.00–1.20)
Total Protein: 6.2 g/dL (ref 5.7–8.3)

## 2023-01-06 LAB — LIPASE: Lipase: 46 U/L (ref 13–60)

## 2023-01-06 LAB — LACTATE, SEPSIS: Lactate, Sepsis: 4.5 mmol/L (ref 0.5–2.0)

## 2023-01-06 LAB — MAGNESIUM: Magnesium: 2.5 mg/dL (ref 1.6–2.6)

## 2023-01-06 MED ORDER — MORPHINE SULFATE 4 MG/ML IV SOLN
4 | Freq: Once | INTRAVENOUS | Status: AC
Start: 2023-01-06 — End: 2023-01-06
  Administered 2023-01-06: 23:00:00 4 mg via INTRAVENOUS

## 2023-01-06 MED ORDER — SODIUM CHLORIDE 0.9 % IV BOLUS
0.9 | Freq: Once | INTRAVENOUS | Status: AC
Start: 2023-01-06 — End: 2023-01-06
  Administered 2023-01-06: 23:00:00 1000 mL via INTRAVENOUS

## 2023-01-06 MED ORDER — PIPERACILLIN SOD-TAZOBACTAM SO 4.5 (4-0.5) G IV SOLR
4.54-0.5 (4-0.5) g | Freq: Once | INTRAVENOUS | Status: AC
Start: 2023-01-06 — End: 2023-01-06
  Administered 2023-01-07: 4500 mg via INTRAVENOUS

## 2023-01-06 MED ORDER — SODIUM CHLORIDE 0.9 % IV BOLUS
0.9 | Freq: Once | INTRAVENOUS | Status: AC
Start: 2023-01-06 — End: 2023-01-06
  Administered 2023-01-07: 1000 mL via INTRAVENOUS

## 2023-01-06 MED ORDER — IOPAMIDOL 61 % IV SOLN
61 | Freq: Once | INTRAVENOUS | Status: AC | PRN
Start: 2023-01-06 — End: 2023-01-06
  Administered 2023-01-06: 100 mL via INTRAVENOUS

## 2023-01-06 MED FILL — PIPERACILLIN SOD-TAZOBACTAM SO 4.5 (4-0.5) G IV SOLR: 4.5 (4-0.5) g | INTRAVENOUS | Qty: 4500

## 2023-01-06 MED FILL — MORPHINE SULFATE (PF) 4 MG/ML IJ SOLN: 4 mg/mL | INTRAMUSCULAR | Qty: 1

## 2023-01-06 NOTE — Telephone Encounter (Signed)
Open in error

## 2023-01-06 NOTE — H&P (Signed)
[facility] Hospitalist Service     Hospitalist H & P     Name: Jesse Savage   DOB: August 11, 1960 (Age: 62 y.o.)   Date of Admission: 01/06/23    Primary Care Provider: Fulton Mole, MD  Attending: Olivia Canter, MD    Chief Complaint: Difficulty swallowing, intractable abdominal pain     History of Present Illness  Jesse Savage is a 62 y.o. male who presented to the ED with complaints of intractable generalized abdominal tenderness as well as difficulty swallowing. Of note, patient was hospitalized from 7/22-26 for similar complaints aside from swallowing issues. He was empirically started on Zosyn/Vanc and imaging obtained. Attempt was made to perform paracentesis but no fluid notable for collection. Nothing grew on previous cultures obtained though. He was seen by Palliative/Oncology and made DNR. Pain control was given. He reports that he was tired of being "stuck" and requested discharge even though he wasn't completely medically optimized so was given PO Abx. Since discharge reports continued fatigue/weakness. He states that pain worse as unable to swallow pills. He notes that his throat hurts and pills feel like they are getting stuck there. Notes mild heart burn as well. As such, unable to take PO Abx or pain medications.     VS obtained notable for mild tachycardia and low normal BP. Labs/imaging obtained in ER. Patient given IV Zosyn and IVF in ER. Morphine given for pain control. Cultures sent. Admission requested for further management.      Past Medical History     Past Medical History:   Diagnosis Date    Back pain     Lumbar disc disease     OSA (obstructive sleep apnea)     Doesnt use machine    Sciatica         Past Surgical History     Past Surgical History:   Procedure Laterality Date    BACK SURGERY  06/2010    L5-S1 REMOVAL OF SCREWS    CERVICAL SPINE SURGERY  08/26/2019    C5-C6 ACDF    FACIAL SURGERY  1981    HERNIA REPAIR  2002    IR PORT PLACEMENT EQUAL OR GREATER THAN 5 YEARS   10/31/2022    IR PORT PLACEMENT EQUAL OR GREATER THAN 5 YEARS 10/31/2022 Tana Conch, MD [facility]    LUMBAR FUSION  04/22/2011    L4-L5 PLIF USING DePUY SPACER, BMP, AUTOGRAFT, LATERAL FUSION USING SYNTHES SCREWS    THUMB ARTHROSCOPY  1995    TONSILLECTOMY AND ADENOIDECTOMY  1972        Allergies   Codeine, Dilaudid [hydromorphone hcl], Hydrocodone, Pregabalin, Tramadol hcl, Duloxetine hcl, Gabapentin, and Hydrocodone-acetaminophen       Medications     Current Outpatient Medications   Medication Instructions    amoxicillin-clavulanate (AUGMENTIN) 875-125 MG per tablet 1 tablet, Oral, 2 TIMES DAILY    Docusate Sodium 100 MG TABS 1 tablet, Oral, 2 TIMES DAILY    doxycycline hyclate (VIBRA-TABS) 100 mg, Oral, 2 TIMES DAILY    Handicap Placard MISC Does not apply    Magic Mouthwash (MIRACLE MOUTHWASH) 5 mLs, Swish & Spit, 4 TIMES DAILY PRN, Diphenhyddramine: nystatin: lidocaine: maalox    Naproxen Sodium (ALEVE) 220 MG CAPS 1 tablet, Oral, 2 TIMES DAILY    omeprazole (PRILOSEC) 40 mg, Oral, DAILY    ondansetron (ZOFRAN-ODT) 8 mg, Oral, EVERY 8 HOURS PRN    oxyCODONE (OXYCONTIN) 20 mg, Oral, 2 TIMES DAILY, Intended supply: 30 days  oxyCODONE-acetaminophen (PERCOCET) 10-325 MG per tablet 1 tablet, Oral, EVERY 8 HOURS PRN, Intended supply: 30 days    Polyethylene Glycol 3350 (MIRALAX PO) 17 g, Oral, 2 TIMES DAILY    promethazine (PHENERGAN) 25 mg, Oral, EVERY 6 HOURS PRN    simethicone (MYLICON) 80 MG chewable tablet 1 capsule, Oral, 2 TIMES DAILY         Family History     Family History   Problem Relation Age of Onset    Heart Failure Father     Diabetes Father     Cancer Father         Bladder Cancer    Diabetes Paternal Grandmother     Heart Failure Paternal Grandfather         Social History      Social History     Socioeconomic History    Marital status: Married     Spouse name: Not on file    Number of children: Not on file    Years of education: Not on file    Highest education level: Not on file   Occupational  History    Not on file   Tobacco Use    Smoking status: Never    Smokeless tobacco: Never   Vaping Use    Vaping Use: Never used   Substance and Sexual Activity    Alcohol use: Not Currently    Drug use: Never    Sexual activity: Not on file   Other Topics Concern    Not on file   Social History Narrative    Not on file     Social Determinants of Health     Financial Resource Strain: Not on file   Food Insecurity: No Food Insecurity (12/29/2022)    Hunger Vital Sign     Worried About Running Out of Food in the Last Year: Never true     Ran Out of Food in the Last Year: Never true   Transportation Needs: No Transportation Needs (12/29/2022)    PRAPARE - Therapist, art (Medical): No     Lack of Transportation (Non-Medical): No   Physical Activity: Not on file   Stress: Not on file   Social Connections: Not on file   Intimate Partner Violence: Not on file   Housing Stability: Low Risk  (12/29/2022)    Housing Stability Vital Sign     Unable to Pay for Housing in the Last Year: No     Number of Places Lived in the Last Year: 1     Unstable Housing in the Last Year: No        Review of Systems (positives bolded, otherwise negative)   Constitutional: No fevers, chills, sweats  Eye: No recent visual problems  ENMT: No ear pain, nasal congestion, sore throat, swallowing difficulties  Respiratory: No shortness of breath, cough  Cardiovascular: No Chest pain, palpitations, syncope  Gastrointestinal: No nausea, vomiting, diarrhea, abdominal pain  Genitourinary: No hematuria  Hema/Lymph: Negative for bruising tendency, swollen lymph glands  Endocrine: Negative for excessive thirst, excessive hunger  Musculoskeletal: No back pain, neck pain, joint pain, muscle pain, decreased range of motion  Integumentary: No rash, pruritus, abrasions  Neurologic: Alert & oriented X 4, no focal neurological deficits  Psychiatric: No anxiety, depression         Physical Exam     Vitals:    01/06/23 1900 01/06/23 2006  01/06/23 2007 01/06/23 2030   BP: 100/74 115/75  105/68   Pulse: (!) 109   97   Resp: 28      Temp:       TempSrc:       SpO2: 97%  98% 98%   Weight:       Height:           General: White male, chronically ill appearing, Alert and oriented, no acute distress.  Eye: PERRL, EOMI, normal conjuctiva.  HENT: Normocephalic, normal hearing, moist oral mucosa, no scleral icterus, no sinus tenderness.Temporal wasting evident. Poor oral dentition.   Neck: Supple, non-tender, no carotid bruits, no JVD, no lymphadenopathy.  Lungs: Clear to auscultation and percussion, non-labored respiration.  Heart: Normal rate, regular rhythm, no murmur or gallop.  Abdomen: Soft, generalized tenderness w/o rebound, mildly distended, normal bowel sounds, no masses.  Extremities/Musculoskeletal: Normal range of motion and strength, no tenderness, +1 pitting edema bilaterally.   Skin: Skin is warm, dry and pink, no rashes or lesions.  Neurologic: Awake, alert, and oriented X3, CN II-XII intact.  Psychiatric: Cooperative, appropriate mood and affect.         Labs and Imaging     Labs:   Hematologic/Coags Chemistries   Recent Labs     01/06/23  1831   WBC 22.7*   HGB 10.3*   HCT 33.4*   PLT 284     Lab Results   Component Value Date/Time    ALBUMIN 1.7 01/06/2023 06:31 PM     No components found for: "HGBA1C"  Lab Results   Component Value Date/Time    INR 1.3 12/30/2022 12:22 PM    INR 1.1 10/31/2022 02:01 PM    PROTIME 15.4 12/30/2022 12:22 PM    PROTIME 14.4 10/31/2022 02:01 PM     Lab Results   Component Value Date/Time    APTT 36.0 12/30/2022 12:22 PM     Lab Results   Component Value Date/Time    DDIMER >20.00 12/30/2022 12:04 AM      Recent Labs     01/06/23  1831 01/06/23  1939   NA 125*  --    K Hemolyzed* 4.6   CL 93*  --    CO2 18*  --    BUN 21  --    CREATININE 0.4*  --    ALBUMIN 1.7*  --    BILITOT 1.59*  --    ALKPHOS 649*  --    AST 306*  --    ALT 50  --      No results for input(s): "GLU" in the last 72 hours.  No results found  for: "CPK", "CKMB", "TROPONINI"  Lab Results   Component Value Date/Time    IRON 21 12/30/2022 06:31 AM    FERRITIN 2,372.0 12/30/2022 06:31 AM        Inflammatory/Respiratory Diabetes   No results found for: "CRP"  No results found for: "ESR"  ABGs:  No results found for: "PHART", "PO2ART", "HCO3", "PCO2ART"   Lab Results   Component Value Date/Time    CREATININE 0.4 01/06/2023 06:31 PM                CT ABDOMEN PELVIS W IV CONTRAST Additional Contrast? None    Result Date: 01/06/2023  1. Slightly larger small bilateral pleural effusions. Increased now large quantity abdominal pelvic ascites. Peritoneal thickening again noted-given background extensive metastatic disease elsewhere this may represent carcinomatosis rather than infectious or inflammatory peritonitis. 2. Short term stability extensive pulmonary metastatic nodules and large coalescing  hepatic metastatic lesions. Abdominal/retroperitoneal adenopathy as before.        Past Microbiologic History (if applicable)  No results for input(s): "SDES", "CULTURE" in the last 72 hours.     Assessment & Plan   62 y/o white male with pmhx as above who presents to the ER w/ complaints of difficulty swallowing/intractable abdominal pain.   Concern for sepsis  As before when previously admitted found to have elevated LA, tachycardia, elevated WBC, and concerns of peritonitis. 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 Reviewed discharge summary and appears that patient was treated w/ Zosyn empirically but no growth on culture. MRSA swab negative. US paracentesis attempted but no fluid to tap. Imaging at this time showing increase in pelvic ascites so will attempt to get US paracentesis for cell count/culture. Continue on empiric Zosyn and IVF for now. Follow up cultures obtained and labs. NPO for now.     Stg IV gastric cancer, squamous cell differentiation-PD-L1 positive, HER 2 positive w/ concern for peritonitis  Consult Oncology/Palliative.  Code status DNR. Will provide IV narcotics for pain control at this time given complains of swallowing difficulties.     Odynophagia  Noted by patient. Recent CT chest obtained showing worsening pulm met's but no esophageal issues. Patient states he is unable to tolerate taking pills but able to tolerate liquids. Given his immunosuppressed state he could have developed esophageal candidiasis so will empirically start on Fluconazole 400 mg IV x 1 then plan to start 200 mg IV tomorrow. Place on IV PPI. Consider GI consultation. SLP evaluation.     Hyponatremia  Na decreasing down to 125 from 127. Likely related to poor oral intake. Will continue on IVF for now and trend Na.     OSA  Intolerant to CPAP.     Anemia of chronic disease/Iron def anemia  Hb 8.7 at time of last check but elevated to 10.3 on admission. Likely due to some hemoconcentration from poor oral intake. Will monitor for need of transfusion.     Severe protein-calorie malnutrition  As documented by dietary during in past. Dietary to be consulted.     Diet: NPO  DVT ppx: Lovenox  Code Status: DNR    Disposition: Patient to be admitted as observation status at this time as I suspect they will require less than 2 nights of hospitalization at this time. If discharged patient at risk for worsening potential intraabdominal infection and requires Abx/attempt at paracentesis/palliative and Oncology evaluation.       Olivia Canter, MD  01/06/2023 9:05 PM  [facility] Hospitalist Service    This note reflects my/our clinical thought processes and is intended primarily for the exchange of information between healthcare providers.  It is not written primarily to communicate with the patient or family directly, which takes place in person at the bedside.  This note may contain sensitive information, including substance use, mental health, and consideration of sensitive, serious, or other "do not miss" diagnoses, which is further discussed at the bedside.

## 2023-01-06 NOTE — ED Provider Notes (Signed)
[facility] DEPT  EMERGENCY DEPARTMENT ENCOUNTER      Pt Name: Jesse Savage  MRN: [id number]  Birthdate 1961/02/15  Date of evaluation: 01/06/2023  Provider: Ramond Marrow, MD    CHIEF COMPLAINT       Chief Complaint   Patient presents with    Abdominal Pain    Dehydration     Pt states being in the hospital last wk but leaving ama; has metastatic CA and here due to difficulty swallowing for 3-4 days and abd pain         HISTORY OF PRESENT ILLNESS      HPI    Patient is a 62 year old male with past medical history of gastric carcinoma with peritoneal carcinomatosis.  Patient was just admitted to the hospital,  states that he left before providers wanted him to.  Patient's workup at that time showed considerable lactic acidosis, leukocytosis up to 30.5k, lactic in the 3 range near time of discharge on chart review.  Was on Zosyn, initially vancomycin however this was discontinued.  Patient follows with Dr. Durwin Reges, oncology.  After returning home patient notes that symptoms persisted, considerable abdominal pain still, unable to tolerate much by mouth, limited p.o. intake.  Denies any chest pain or shortness of breath.  Does describe odynophagia during this timeframe.  States that he is been unable to take much of his medication secondary to odynophagia.  No described change in urinary or bowel habits that he notes.  Patient did have anemia on that evaluation, down to 6.8, treated with 1 unit of PRBC.      Nursing Notes were reviewed.    REVIEW OF SYSTEMS       Review of Systems   Constitutional:  Positive for appetite change and fatigue.   Respiratory:  Negative for shortness of breath.    Cardiovascular:  Negative for chest pain.   Gastrointestinal:  Positive for abdominal pain and nausea.   Neurological:  Negative for weakness and numbness.       Except as noted above the remainder of the review of systems was reviewed and negative.       PAST MEDICAL HISTORY     Past Medical History:   Diagnosis Date     Back pain     Lumbar disc disease     OSA (obstructive sleep apnea)     Doesnt use machine    Sciatica          SURGICAL HISTORY       Past Surgical History:   Procedure Laterality Date    BACK SURGERY  06/2010    L5-S1 REMOVAL OF SCREWS    CERVICAL SPINE SURGERY  08/26/2019    C5-C6 ACDF    FACIAL SURGERY  1981    HERNIA REPAIR  2002    IR PORT PLACEMENT EQUAL OR GREATER THAN 5 YEARS  10/31/2022    IR PORT PLACEMENT EQUAL OR GREATER THAN 5 YEARS 10/31/2022 Tana Conch, MD [facility]    LUMBAR FUSION  04/22/2011    L4-L5 PLIF USING DePUY SPACER, BMP, AUTOGRAFT, LATERAL FUSION USING SYNTHES SCREWS    THUMB ARTHROSCOPY  1995    TONSILLECTOMY AND ADENOIDECTOMY  1972         CURRENT MEDICATIONS       Previous Medications    AMOXICILLIN-CLAVULANATE (AUGMENTIN) 875-125 MG PER TABLET    Take 1 tablet by mouth 2 times daily for 7 days    DOCUSATE SODIUM 100 MG TABS  Take 1 tablet by mouth 2 times daily    DOXYCYCLINE HYCLATE (VIBRA-TABS) 100 MG TABLET    Take 1 tablet by mouth 2 times daily for 7 days    HANDICAP PLACARD MISC    by Does not apply route    MAGIC MOUTHWASH (MIRACLE MOUTHWASH)    Swish and spit 5 mLs 4 times daily as needed for Irritation Diphenhyddramine: nystatin: lidocaine: maalox    NAPROXEN SODIUM (ALEVE) 220 MG CAPS    Take 1 tablet by mouth 2 times daily    OMEPRAZOLE (PRILOSEC) 40 MG DELAYED RELEASE CAPSULE    Take 1 capsule by mouth daily    ONDANSETRON (ZOFRAN-ODT) 8 MG TBDP DISINTEGRATING TABLET    Take 1 tablet by mouth every 8 hours as needed for Nausea or Vomiting (nausea, vomiting)    OXYCODONE (OXYCONTIN) 20 MG EXTENDED RELEASE TABLET    Take 1 tablet by mouth 2 times daily for 30 days. Intended supply: 30 days Max Daily Amount: 40 mg    OXYCODONE-ACETAMINOPHEN (PERCOCET) 10-325 MG PER TABLET    Take 1 tablet by mouth every 8 hours as needed for Pain for up to 30 doses. Intended supply: 30 days Max Daily Amount: 3 tablets    POLYETHYLENE GLYCOL 3350 (MIRALAX PO)    Take 17 g by mouth 2 times  daily    PROMETHAZINE (PHENERGAN) 25 MG TABLET    Take 1 tablet by mouth every 6 hours as needed for Nausea    SIMETHICONE (MYLICON) 80 MG CHEWABLE TABLET    Take 1 capsule by mouth 2 times daily       ALLERGIES     Codeine, Dilaudid [hydromorphone hcl], Hydrocodone, Pregabalin, Tramadol hcl, Duloxetine hcl, Gabapentin, and Hydrocodone-acetaminophen    FAMILY HISTORY       Family History   Problem Relation Age of Onset    Heart Failure Father     Diabetes Father     Cancer Father         Bladder Cancer    Diabetes Paternal Grandmother     Heart Failure Paternal Grandfather           SOCIAL HISTORY       Social History     Socioeconomic History    Marital status: Married   Tobacco Use    Smoking status: Never    Smokeless tobacco: Never   Vaping Use    Vaping Use: Never used   Substance and Sexual Activity    Alcohol use: Not Currently    Drug use: Never     Social Determinants of Health     Food Insecurity: No Food Insecurity (12/29/2022)    Hunger Vital Sign     Worried About Running Out of Food in the Last Year: Never true     Ran Out of Food in the Last Year: Never true   Transportation Needs: No Transportation Needs (12/29/2022)    PRAPARE - Therapist, art (Medical): No     Lack of Transportation (Non-Medical): No   Housing Stability: Low Risk  (12/29/2022)    Housing Stability Vital Sign     Unable to Pay for Housing in the Last Year: No     Number of Places Lived in the Last Year: 1     Unstable Housing in the Last Year: No       SCREENINGS         Glasgow Coma Scale  Eye Opening: Spontaneous  Best  Verbal Response: Oriented  Best Motor Response: Obeys commands  Glasgow Coma Scale Score: 15                     CIWA Assessment  BP: 105/68  Pulse: 97                 PHYSICAL EXAM       ED Triage Vitals [01/06/23 1755]   BP Temp Temp Source Pulse Respirations SpO2 Height Weight - Scale   93/75 97.1 F (36.2 C) Oral (!) 115 18 95 % 1.753 m (5\' 9" ) 80.6 kg (177 lb 9.6 oz)       Physical  Exam  Constitutional:       General: He is not in acute distress.     Appearance: Normal appearance. He is not ill-appearing.   HENT:      Head: Normocephalic and atraumatic.      Right Ear: External ear normal.      Left Ear: External ear normal.      Nose: Nose normal.   Eyes:      Extraocular Movements: Extraocular movements intact.      Conjunctiva/sclera: Conjunctivae normal.   Cardiovascular:      Pulses: Normal pulses.      Comments: Tachycardic rate and normal rhythm, strong distal pulses, no abnormal heart sounds auscultated  Pulmonary:      Effort: Pulmonary effort is normal.      Breath sounds: No wheezing.      Comments: Respirations even and unlabored, clear breath sounds throughout, no tachypnea  Abdominal:      General: Abdomen is flat. There is distension.      Tenderness: There is abdominal tenderness.      Comments: Abdomen diffusely tender to palpation, mild voluntary guarding   Musculoskeletal:         General: No deformity or signs of injury.      Cervical back: No rigidity.   Neurological:      General: No focal deficit present.      Mental Status: He is alert. Mental status is at baseline.   Psychiatric:         Mood and Affect: Mood normal.         Behavior: Behavior normal.         DIAGNOSTIC RESULTS     RADIOLOGY:     Interpretation per the Radiologist below, if available at the time of this note:    CT ABDOMEN PELVIS W IV CONTRAST Additional Contrast? None   Final Result      1. Slightly larger small bilateral pleural effusions. Increased now large    quantity abdominal pelvic ascites. Peritoneal thickening again noted-given    background extensive metastatic disease elsewhere this may represent    carcinomatosis rather than infectious or inflammatory peritonitis.   2. Short term stability extensive pulmonary metastatic nodules and large    coalescing hepatic metastatic lesions. Abdominal/retroperitoneal adenopathy as    before.          LABS:  Labs Reviewed   CBC WITH AUTO DIFFERENTIAL -  Abnormal; Notable for the following components:       Result Value    WBC 22.7 (*)     Hemoglobin 10.3 (*)     Hematocrit 33.4 (*)     MCV 80.9 (*)     MCH 24.9 (*)     RDW 24.6 (*)     NRBC Absolute 0.020 (*)  All other components within normal limits   COMPREHENSIVE METABOLIC PANEL - Abnormal; Notable for the following components:    Sodium 125 (*)     Potassium Hemolyzed (*)     Chloride 93 (*)     CO2 18 (*)     Creatinine 0.4 (*)     Osmolaliy Calculated 254 (*)     Calcium 7.6 (*)     Albumin 1.7 (*)     Globulin 4.5 (*)     Albumin/Globulin Ratio 0.40 (*)     Total Bilirubin 1.59 (*)     Alk Phosphatase 649 (*)     AST 306 (*)     All other components within normal limits   LACTATE, SEPSIS - Abnormal; Notable for the following components:    Lactate, Sepsis 4.5 (*)     All other components within normal limits   LACTATE, SEPSIS - Abnormal; Notable for the following components:    Lactate, Sepsis 3.3 (*)     All other components within normal limits   MANUAL DIFFERENTIAL - Abnormal; Notable for the following components:    Neutrophils %. Manual count 85 (*)     Lymphocytes 6 (*)     Neutrophils Absolute 19.3 (*)     Monocytes Absolute 1.8 (*)     RBC Morphology Abnormal (*)     Polychromasia Few (*)     Microcytes Few (*)     Macrocytes Few (*)     Ovalocytes Few (*)     All other components within normal limits   CULTURE, URINE   CULTURE, BLOOD 1   LIPASE   MAGNESIUM   POTASSIUM   URINALYSIS W/ RFLX MICROSCOPIC   CBC WITH AUTO DIFFERENTIAL   COMPREHENSIVE METABOLIC PANEL   LACTIC ACID       All other labs were within normal range or not returned as of this dictation.    EMERGENCY DEPARTMENT COURSE and DIFFERENTIAL DIAGNOSIS/MDM:   Vitals:    Vitals:    01/06/23 1900 01/06/23 2006 01/06/23 2007 01/06/23 2030   BP: 100/74 115/75  105/68   Pulse: (!) 109   97   Resp: 28      Temp:       TempSrc:       SpO2: 97%  98% 98%   Weight:       Height:             Medical Decision Making  Patient is a 62 year old male  with past medical history as described above who presents today with abdominal pain, limited p.o. intake.  Patient tachycardic on arrival, blood pressure marginal at 94/67.  Patient very fatigued appearing, chronically ill-appearing.  Patient with tenderness palpation on examination.  Concern for continued disease from prior admission, either volume based tachycardia/hypotension versus infection.  Concern for recurrent sepsis again today.  Blood cultures obtained, early lactic obtained, treated with IV fluids.    Amount and/or Complexity of Data Reviewed  Labs: ordered.  Radiology: ordered.    Risk  Prescription drug management.  Decision regarding hospitalization.          REASSESSMENT     ED Course as of 01/06/23 2102   Tue Jan 06, 2023   2101 CBC with continued leukocytosis although slightly downtrending from prior, metabolic panel with considerable derangements is likely here, sodium 125, chloride 93, bicarb 18.  LFTs elevated.  Lactic of 4.5 initially, down to 3.3 on recheck after IV fluids.  CT with worsening ascites,  considerable metastatic disease, likely carcinomatosis.  Covering with Zosyn today.  Patient with improvement in symptoms on reassessment.  Plan for admission at this time, discussed with Dr. Katrinka Blazing of hospital service, agrees with admission.  Consideration of paracentesis while inpatient. [EV]      ED Course User Index  [EV] Rojean Ige, Lodema Hong, MD         PROCEDURES     Unless otherwise noted below, none     Procedures    FINAL IMPRESSION      1. Generalized abdominal pain    2. Lactic acidosis    3. Nausea and vomiting, unspecified vomiting type          DISPOSITION/PLAN   DISPOSITION Admitted 01/06/2023 09:01:20 PM      PATIENT REFERRED TO:  No follow-up provider specified.    DISCHARGE MEDICATIONS:  New Prescriptions    No medications on file     Controlled Substances Monitoring:          No data to display                (Please note that portions of this note were completed with a  voice recognition program.  Efforts were made to edit the dictations but occasionally words are mis-transcribed.)    Ramond Marrow, MD (electronically signed)  Attending Emergency Physician                                           Ramond Marrow, MD  01/06/23 2102

## 2023-01-07 ENCOUNTER — Inpatient Hospital Stay: Admit: 2023-01-07 | Payer: MEDICARE | Primary: Internal Medicine

## 2023-01-07 LAB — CELL COUNT WITH DIFFERENTIAL, BODY FLUID
Lymphocytes, Body Fluid: 8 %
Macrophages: 8 %
Monocytes, Body Fluid: 8 %
Polymorphonuclear Cells Body Fluid: 74 %
Tissue Cells, Body Fluid: 2 %
Total Nucleated cells Body Fluid: 2341 cells/mcL
Total Volume Received Body Fluid: 1000 mL

## 2023-01-07 LAB — COMPREHENSIVE METABOLIC PANEL
ALT: 35 U/L (ref 0–50)
AST: 215 U/L — ABNORMAL HIGH (ref 0–50)
Albumin/Globulin Ratio: 0.5 — ABNORMAL LOW (ref 1.00–2.70)
Albumin: 1.6 g/dL — ABNORMAL LOW (ref 3.5–5.2)
Alk Phosphatase: 505 U/L — ABNORMAL HIGH (ref 40–130)
Anion Gap: 15 mmol/L (ref 2–17)
BUN: 18 mg/dL (ref 8–23)
CALCIUM,CORRECTED,CCA: 8.9 mg/dL (ref 8.5–10.7)
CO2: 16 mmol/L — ABNORMAL LOW (ref 22–29)
Calcium: 7 mg/dL — ABNORMAL LOW (ref 8.5–10.7)
Chloride: 100 mmol/L (ref 98–107)
Creatinine: 0.4 mg/dL — ABNORMAL LOW (ref 0.7–1.3)
Est, Glom Filt Rate: 123 mL/min/1.73m (ref >=60)
Globulin: 3.2 g/dL (ref 1.9–4.4)
Glucose: 77 mg/dL (ref 70–99)
Osmolaliy Calculated: 263 mOsm/kg — ABNORMAL LOW (ref 270–287)
Potassium: 4.4 mmol/L (ref 3.5–5.3)
Sodium: 131 mmol/L — ABNORMAL LOW (ref 135–145)
Total Bilirubin: 1.3 mg/dL — ABNORMAL HIGH (ref 0.00–1.20)
Total Protein: 4.8 g/dL — ABNORMAL LOW (ref 5.7–8.3)

## 2023-01-07 LAB — LACTIC ACID: Lactic Acid: 2.5 mmol/L — ABNORMAL HIGH (ref 0.5–2.0)

## 2023-01-07 LAB — MICROSCOPIC URINALYSIS: MUCUS, URINE: NONE SEEN /LPF

## 2023-01-07 LAB — CBC WITH AUTO DIFFERENTIAL
Basophils %: 0.1 % (ref 0.0–2.0)
Basophils Absolute: 0 10*3/uL (ref 0.0–0.2)
Eosinophils %: 0 % (ref 0.0–7.0)
Eosinophils Absolute: 0 10*3/uL (ref 0.0–0.5)
Hematocrit: 26.2 % — ABNORMAL LOW (ref 38.0–52.0)
Hemoglobin: 7.8 g/dL — ABNORMAL LOW (ref 13.0–17.3)
Immature Grans (Abs): 0.22 10*3/uL — ABNORMAL HIGH (ref 0.00–0.06)
Immature Granulocytes %: 1.3 % — ABNORMAL HIGH (ref 0.0–0.6)
Lymphocytes Absolute: 1.3 10*3/uL (ref 1.0–3.2)
Lymphocytes: 7.8 % — ABNORMAL LOW (ref 15.0–45.0)
MCH: 24.9 pg — ABNORMAL LOW (ref 27.0–34.5)
MCHC: 29.8 g/dL — ABNORMAL LOW (ref 30.0–36.0)
MCV: 83.7 fL — ABNORMAL LOW (ref 84.0–100.0)
MPV: 9.6 fL (ref 7.0–12.2)
Monocytes %: 9 % (ref 4.0–12.0)
Monocytes Absolute: 1.5 10*3/uL — ABNORMAL HIGH (ref 0.3–1.0)
NRBC Absolute: 0 10*3/uL (ref 0.000–0.012)
NRBC Automated: 0 % (ref 0.0–0.2)
Neutrophils %: 81.8 % — ABNORMAL HIGH (ref 42.0–74.0)
Neutrophils Absolute: 13.9 10*3/uL — ABNORMAL HIGH (ref 1.6–7.3)
Platelets: 204 10*3/uL (ref 140–440)
RBC: 3.13 x10e6/mcL — ABNORMAL LOW (ref 4.00–5.60)
RDW: 24.4 % — ABNORMAL HIGH (ref 10.0–17.0)
WBC: 17 10*3/uL — ABNORMAL HIGH (ref 3.8–10.6)

## 2023-01-07 LAB — URINALYSIS W/ RFLX MICROSCOPIC
Bilirubin, Urine: NEGATIVE
Glucose, Ur: NEGATIVE
Leukocyte Esterase, Urine: NEGATIVE
Nitrite, Urine: NEGATIVE
Protein, UA: 30 — AB
Specific Gravity, UA: 1.049 (ref 1.003–1.035)
Urobilinogen, Urine: 1 EU/dL (ref >1.0)
pH, Urine: 5.5 (ref 4.5–8.0)

## 2023-01-07 LAB — LACTATE, SEPSIS: Lactate, Sepsis: 3.3 mmol/L (ref 0.5–2.0)

## 2023-01-07 LAB — POTASSIUM: Potassium: 4.6 mmol/L (ref 3.5–5.3)

## 2023-01-07 LAB — CULTURE, URINE: FINAL REPORT: NO GROWTH

## 2023-01-07 MED ORDER — ACETAMINOPHEN 325 MG PO TABS
325 | Freq: Four times a day (QID) | ORAL | Status: DC | PRN
Start: 2023-01-07 — End: 2023-01-09

## 2023-01-07 MED ORDER — SODIUM CHLORIDE 0.9 % IV SOLN (MINI-BAG)
0.9 | Freq: Three times a day (TID) | INTRAVENOUS | Status: DC
Start: 2023-01-07 — End: 2023-01-09
  Administered 2023-01-07 – 2023-01-09 (×7): 3375 mg via INTRAVENOUS

## 2023-01-07 MED ORDER — ONDANSETRON HCL 4 MG/2ML IJ SOLN
4 | INTRAMUSCULAR | Status: DC | PRN
Start: 2023-01-07 — End: 2023-01-09
  Administered 2023-01-07: 19:00:00 4 mg via INTRAVENOUS

## 2023-01-07 MED ORDER — SODIUM CHLORIDE 0.9 % IV SOLN
0.9 | INTRAVENOUS | Status: DC | PRN
Start: 2023-01-07 — End: 2023-01-09

## 2023-01-07 MED ORDER — OXYCODONE-ACETAMINOPHEN 10-325 MG PO TABS
10-325 | ORAL | Status: DC | PRN
Start: 2023-01-07 — End: 2023-01-09
  Administered 2023-01-08 – 2023-01-09 (×4): 1 via ORAL

## 2023-01-07 MED ORDER — LIDOCAINE HCL (PF) 1 % IJ SOLN
1 | INTRAMUSCULAR | Status: AC | PRN
Start: 2023-01-07 — End: 2023-01-07
  Administered 2023-01-07: 14:00:00 5 via INTRADERMAL

## 2023-01-07 MED ORDER — SENNOSIDES 8.6 MG PO TABS
8.6 | Freq: Every evening | ORAL | Status: DC
Start: 2023-01-07 — End: 2023-01-09
  Administered 2023-01-08 – 2023-01-09 (×2): 8.6 via ORAL

## 2023-01-07 MED ORDER — NORMAL SALINE FLUSH 0.9 % IV SOLN
0.9 | Freq: Two times a day (BID) | INTRAVENOUS | Status: DC
Start: 2023-01-07 — End: 2023-01-09
  Administered 2023-01-07 – 2023-01-09 (×6): 10 mL via INTRAVENOUS

## 2023-01-07 MED ORDER — NORMAL SALINE FLUSH 0.9 % IV SOLN
0.9 | INTRAVENOUS | Status: DC | PRN
Start: 2023-01-07 — End: 2023-01-09
  Administered 2023-01-07 (×2): 10 mL via INTRAVENOUS

## 2023-01-07 MED ORDER — OXYCODONE HCL ER 20 MG PO T12A
20 | Freq: Two times a day (BID) | ORAL | Status: DC
Start: 2023-01-07 — End: 2023-01-07
  Administered 2023-01-07: 13:00:00 20 mg via ORAL

## 2023-01-07 MED ORDER — PANTOPRAZOLE SODIUM 40 MG IV SOLR
40 | Freq: Every day | INTRAVENOUS | Status: DC
Start: 2023-01-07 — End: 2023-01-09
  Administered 2023-01-07 – 2023-01-09 (×3): 40 mg via INTRAVENOUS

## 2023-01-07 MED ORDER — ENOXAPARIN SODIUM 40 MG/0.4ML IJ SOSY
40 | Freq: Every day | INTRAMUSCULAR | Status: DC
Start: 2023-01-07 — End: 2023-01-09
  Administered 2023-01-08 – 2023-01-09 (×2): 40 mg via SUBCUTANEOUS

## 2023-01-07 MED ORDER — ONDANSETRON 4 MG PO TBDP
4 | Freq: Four times a day (QID) | ORAL | Status: DC | PRN
Start: 2023-01-07 — End: 2023-01-09

## 2023-01-07 MED ORDER — MORPHINE SULFATE (PF) 4 MG/ML IJ SOLN
4 | INTRAMUSCULAR | Status: DC | PRN
Start: 2023-01-07 — End: 2023-01-09
  Administered 2023-01-07 – 2023-01-08 (×8): 4 mg via INTRAVENOUS

## 2023-01-07 MED ORDER — ACETAMINOPHEN 650 MG RE SUPP
650 | Freq: Four times a day (QID) | RECTAL | Status: DC | PRN
Start: 2023-01-07 — End: 2023-01-09

## 2023-01-07 MED ORDER — MELATONIN 3 MG PO TABS
3 | Freq: Every evening | ORAL | Status: DC | PRN
Start: 2023-01-07 — End: 2023-01-09

## 2023-01-07 MED ORDER — POLYETHYLENE GLYCOL 3350 17 G PO PACK
17 | Freq: Every day | ORAL | Status: DC | PRN
Start: 2023-01-07 — End: 2023-01-09

## 2023-01-07 MED ORDER — SODIUM CHLORIDE 0.9 % IV SOLN
0.9 | INTRAVENOUS | Status: DC
Start: 2023-01-07 — End: 2023-01-08
  Administered 2023-01-07: 03:00:00 via INTRAVENOUS

## 2023-01-07 MED ORDER — BISACODYL 10 MG RE SUPP
10 | Freq: Every day | RECTAL | Status: DC | PRN
Start: 2023-01-07 — End: 2023-01-09

## 2023-01-07 MED ORDER — FLUCONAZOLE IN SODIUM CHLORIDE 400-0.9 MG/200ML-% IV SOLN
Freq: Once | INTRAVENOUS | Status: AC
Start: 2023-01-07 — End: 2023-01-07
  Administered 2023-01-07: 05:00:00 400 mg via INTRAVENOUS

## 2023-01-07 MED ORDER — ALUM & MAG HYDROXIDE-SIMETH 200-200-20 MG/5ML PO SUSP
200-200-20 | Freq: Four times a day (QID) | ORAL | Status: DC | PRN
Start: 2023-01-07 — End: 2023-01-09

## 2023-01-07 MED ORDER — OXYCODONE-ACETAMINOPHEN 10-325 MG PO TABS
10-325 | Freq: Three times a day (TID) | ORAL | Status: DC | PRN
Start: 2023-01-07 — End: 2023-01-07
  Administered 2023-01-07: 19:00:00 1 via ORAL

## 2023-01-07 MED ORDER — FLUCONAZOLE IN SODIUM CHLORIDE 200-0.9 MG/100ML-% IV SOLN
INTRAVENOUS | Status: DC
Start: 2023-01-07 — End: 2023-01-09
  Administered 2023-01-08 – 2023-01-09 (×2): 200 mg via INTRAVENOUS

## 2023-01-07 MED ORDER — OXYCODONE HCL ER 10 MG PO T12A
10 MG | Freq: Two times a day (BID) | ORAL | Status: DC
Start: 2023-01-07 — End: 2023-01-09
  Administered 2023-01-08 – 2023-01-09 (×4): 30 mg via ORAL

## 2023-01-07 MED ORDER — NYSTATIN 100000 UNIT/ML MT SUSP
100000 | Freq: Four times a day (QID) | OROMUCOSAL | Status: DC
Start: 2023-01-07 — End: 2023-01-09
  Administered 2023-01-07 – 2023-01-09 (×8): 500000 mL via ORAL

## 2023-01-07 MED FILL — MORPHINE SULFATE (PF) 4 MG/ML IJ SOLN: 4 mg/mL | INTRAMUSCULAR | Qty: 1

## 2023-01-07 MED FILL — PROTONIX 40 MG IV SOLR: 40 MG | INTRAVENOUS | Qty: 40

## 2023-01-07 MED FILL — NORMAL SALINE FLUSH 0.9 % IV SOLN: 0.9 % | INTRAVENOUS | Qty: 10

## 2023-01-07 MED FILL — OXYCONTIN 20 MG PO T12A: 20 MG | ORAL | Qty: 1

## 2023-01-07 MED FILL — SENOKOT 8.6 MG PO TABS: 8.6 MG | ORAL | Qty: 1

## 2023-01-07 MED FILL — NYSTATIN 100000 UNIT/ML MT SUSP: 100000 UNIT/ML | OROMUCOSAL | Qty: 5

## 2023-01-07 MED FILL — NORMAL SALINE FLUSH 0.9 % IV SOLN: 0.9 % | INTRAVENOUS | Qty: 20

## 2023-01-07 MED FILL — ONDANSETRON HCL 4 MG/2ML IJ SOLN: 4 MG/2ML | INTRAMUSCULAR | Qty: 2

## 2023-01-07 MED FILL — OXYCONTIN 10 MG PO T12A: 10 MG | ORAL | Qty: 1

## 2023-01-07 MED FILL — PIPERACILLIN SOD-TAZOBACTAM SO 3.375 (3-0.375) G IV SOLR: 3.375 (3-0.375) g | INTRAVENOUS | Qty: 3375

## 2023-01-07 MED FILL — NORMAL SALINE FLUSH 0.9 % IV SOLN: 0.9 % | INTRAVENOUS | Qty: 30

## 2023-01-07 MED FILL — FLUCONAZOLE IN SODIUM CHLORIDE 400-0.9 MG/200ML-% IV SOLN: INTRAVENOUS | Qty: 200

## 2023-01-07 MED FILL — OXYCODONE-ACETAMINOPHEN 10-325 MG PO TABS: 10-325 MG | ORAL | Qty: 1

## 2023-01-07 NOTE — Progress Notes (Addendum)
Comprehensive Nutrition Assessment    Type and Reason for Visit:  Initial MD c/s Poor appetite 5 days, MST 4    Nutrition Recommendations/Plan:   Continue current diet. Encouraged adequate PO intake.  Ordered Magic cups TID (870 kcal, 27 g protein).   Noted historically that pt also plans to take Boost high protein shakes from home TID (750 kcal, 60 g protein). Please offer Ensure Plus HP to replace if pt runs out of Boost.   Need updated weight as admission weight recorded likely in error (32# increase in 6 days).     Malnutrition Assessment:  Malnutrition Status:  Severe malnutrition (01/07/23 1138)    Context:  Chronic Illness     Findings of the 6 clinical characteristics of malnutrition:  Energy Intake:  75% or less estimated energy requirements for 1 month or longer  Weight Loss:      10.5% wt loss x 2 months (severe)  Body Fat Loss:  Severe body fat loss Orbital, Triceps   Muscle Mass Loss:  Severe muscle mass loss Clavicles (pectoralis & deltoids), Temples (temporalis)  Fluid Accumulation:  Mild Extremities, Generalized  Grip Strength:  Not Performed    Nutrition Assessment:    62 y.o. male who presented to the ED with complaints of intractable generalized abdominal tenderness as well as difficulty swallowing. Of note, patient was hospitalized from 7/22-26 for similar complaints aside from swallowing issues. He was seen by Palliative/Oncology and made DNR. Pain control was given. He reports that he was tired of being "stuck" and requested discharge even though he wasn't completely medically optimized so was given PO Abx. Since discharge reports continued fatigue/weakness. He states that pain worse as unable to swallow pills. He notes that his throat hurts and pills feel like they are getting stuck there. Notes mild heart burn as well. Unable to take PO Abx or pain medications. Palliative following, pt is DNR.    Nutrition:  7/31: Pt assessed this AM while NPO, about to be taken to IR. Unable to obtain  comprehensive history but obtained most information from previous RD visit (7/25). However, pt was agreeable to "whatever" supplements I could order for him. Of note, 50-75% current PO intake recorded following advancement of diet to dysphagia diet. The following information was retrieved from previous RD visit on 7/25:       "Pt reports normally eating 3 meals/day at home. However, poor appetite since being dx with cancer in May of this year, was mostly just eating 2 meals/day during that timeframe. Since admit, poor appetite, reports eating 5% of his meals. Denies N/V/D/C or difficulty chewing/swallowing, but does report abdominal distention. LBM 7/25. Pt doesn't like ensure but has been drinking boost high protein from home once/day, agreeable to take TID. Also agreeable to magic cups."    Social hx: No issues with food security/access to food.       Past Medical History:   Diagnosis Date    Back pain     Lumbar disc disease     OSA (obstructive sleep apnea)     Doesnt use machine    Sciatica      Scheduled Meds:   pantoprazole  40 mg IntraVENous Daily    [START ON 01/08/2023] fluconazole  200 mg IntraVENous Q24H    nystatin  5 mL Oral 4x Daily    oxyCODONE  30 mg Oral BID    sodium chloride flush  5-40 mL IntraVENous 2 times per day    enoxaparin  40  mg SubCUTAneous Daily    piperacillin-tazobactam  3,375 mg IntraVENous Q8H     Continuous Infusions:   sodium chloride      sodium chloride 75 mL/hr at 01/06/23 2242     PRN Meds:.oxyCODONE-acetaminophen, sodium chloride flush, sodium chloride, ondansetron **OR** ondansetron, melatonin, polyethylene glycol, bisacodyl, aluminum & magnesium hydroxide-simethicone, acetaminophen **OR** acetaminophen, morphine    Lab Results   Component Value Date/Time    NA 131 01/07/2023 06:11 AM    K 4.4 01/07/2023 06:11 AM    CL 100 01/07/2023 06:11 AM    CO2 16 01/07/2023 06:11 AM    BUN 18 01/07/2023 06:11 AM    CREATININE 0.4 01/07/2023 06:11 AM    GLUCOSE 77 01/07/2023 06:11 AM     CALCIUM 7.0 01/07/2023 06:11 AM    PHOS 2.1 01/02/2023 07:21 AM    MG 2.5 01/06/2023 06:31 PM     No results found for: "TRIG"  No results found for: "POCGLU"     Nutrition Related Findings:     Wound Type: None     Non-pitting BUE/BLE edema noted.   See Malnutrition assessment for NFPE.    Current Nutrition Intake & Therapies:    Average Meal Intake: 51-75%  Average Supplements Intake: None Ordered  ADULT DIET; Dysphagia - Soft and Bite Sized NKFA    Anthropometric Measures:  Height: 175.3 cm (5' 9.02")  Ideal Body Weight (IBW): 160 lbs (73 kg)    Admission Body Weight: 80.6 kg (177 lb 11.1 oz)  Current Body Weight: 80.6 kg (177 lb 11.1 oz), 111.1 % IBW. Weight Source: Bed Scale  Current BMI (kg/m2): 26.2  Weight Adjustment For: No Adjustment  BMI Categories: Overweight (BMI 25.0-29.9)    Weight History:  7/25:  UBW: 180 lb/81.8 kg, reports unintentional weight loss since May of this year.     Per EMR:  73.5 kg (10/27/22)  69.4 kg (11/17/22)  65.8 kg (12/31/22)-10.5% wt loss x 2 months (severe)  80.6 kg (01/06/23)- Edema noted, however likely recorded in error. Need updated weight.    Estimated Daily Nutrient Needs:  Energy Requirements Based On: Kcal/kg  Weight Used for Energy Requirements: Usual (Current BW recoreded likely r/t error)  Energy (kcal/day): 1610-9604 kcal/day (30-35 kcal/kg)  Weight Used for Protein Requirements: Current  Protein (g/day): 98-131 g/day (1.5-2.0 g/kg)  Method Used for Fluid Requirements: ml/Kg  Fluid (ml/day): 1974 ml/day (30 ml/kg) or per MD  Carbs (g/day): 246-288 g/day (50% EEN)    Nutrition Diagnosis:   Severe malnutrition, In context of chronic illness related to inadequate protein-energy intake as evidenced by Criteria as identified in malnutrition assessment    Nutrition Interventions:   Food and/or Nutrient Delivery: Continue Current Diet, Start Oral Nutrition Supplement  Nutrition Education/Counseling: Education not appropriate  Coordination of Nutrition Care: Continue to monitor  while inpatient       Goals:  Goals: Meet at least 75% of estimated needs, PO intake 75% or greater, prevent weight loss, prevent muscle loss/fat loss       Nutrition Monitoring and Evaluation:   Behavioral-Environmental Outcomes: None Identified  Food/Nutrient Intake Outcomes: Food and Nutrient Intake, Supplement Intake  Physical Signs/Symptoms Outcomes: Biochemical Data, Nutrition Focused Physical Findings, Weight, Chewing or Swallowing    Discharge Planning:    Too soon to determine     Vincente Poli, RD

## 2023-01-07 NOTE — Consults (Addendum)
Palliative Medicine Consultation      Reason for Consult:    Goals of Care    Requesting Physician:  Katrinka Blazing  Primary Care Provider: Fulton Mole, MD     CHIEF COMPLAINT:    Chief Complaint   Patient presents with    Abdominal Pain    Dehydration     Pt states being in the hospital last wk but leaving [facility]; has metastatic CA and here due to difficulty swallowing for 3-4 days and abd pain        HISTORY OF PRESENT ILLNESS:    61yoM with metastatic gastric cancer and OSA on CPAP who was recently admitted for sepsis of unclear origin, and re-presented four days after discharge with abdominal pain and throat pain. He was found to have concern for thrush as well as sepsis.     Palliative care team was consulted for goals of care.    Chart reviewed extensively.  Reviewed oncology history. Pt was admitted for back surgery 04/2021. Other than his recent admission 7/22-26, no other admissions or ED visits until 10/2022 when he presented with weight loss, abdominal pain and fevers and was found to have gastric, pulmonary and hepatic masses. Underwent EGD with biopsy which revealed stage IV gastric squamous cell carcinoma. He was started on FOLFOX 6/3, nivolumab added 6/17. Repeat scans 7/9 unfortunately demonstrating progression of neoplasm at all pre-existing sites.     During his last admission, palliative team met with pt and his wife. We recommended changing his percocet 7.5/325mg  q6h prn to oxycodone 10mg  q4h prn and adding senna. Pt and wife seemed realistic about his prognosis, and also wished to continue pursuing cancer directed therapies. DNR status was established. He was discharged home with home health.    D/w RN. This morning, Dr. Jena Gauss increased his oxycontin from 20mg  q12h to 30mg  q12h.     Discussed with Dr. Jena Gauss.    D/w oncology NP who spoke with Dr. Durwin Reges.  Oncology is still offering cancer treatment to patient, though if patient wants hospice their team is agreeable.    Discussed with case Production designer, theatre/television/film.    Met  with patient this afternoon.  He says his wife is not coming into the hospital today.  We attempted to get his wife on the phone multiple times but she did not answer.    Patient reports he was requiring Percocet 1/2 tablet of 10 mg / 325 mg every 3 hours around-the-clock in addition to his OxyContin 20 mg every 12 hours.  He had been having regular bowel movements.  Some nausea.  He has been feeling extremely weak.    He understands that he is getting sicker and time is getting short.  We discussed what he is hoping for with next steps.  I told him that oncology has more treatment options to offer him, and also that if he feels ready to stop, hospice is available as well. I provided some basic hospice education.    Though I get the sense that patient wants to stop cancer treatment and is ready for hospice, he says his "bride "is not ready for him to stop "fighting ", so he wishes to continue on with cancer treatment.    He would like to speak with oncology about what the options are going forward and what to expect with his disease.  I recommended his wife be here tomorrow morning when they come around.  He said he would have her here or on the phone.  D/w CM  D/w hospitalist and oncology PA              Past Medical History:        Diagnosis Date    Back pain     Lumbar disc disease     OSA (obstructive sleep apnea)     Doesnt use machine    Sciatica         Past Surgical History:        Procedure Laterality Date    BACK SURGERY  06/2010    L5-S1 REMOVAL OF SCREWS    CERVICAL SPINE SURGERY  08/26/2019    C5-C6 ACDF    FACIAL SURGERY  1981    HERNIA REPAIR  2002    IR PORT PLACEMENT EQUAL OR GREATER THAN 5 YEARS  10/31/2022    IR PORT PLACEMENT EQUAL OR GREATER THAN 5 YEARS 10/31/2022 Tana Conch, MD [facility]    LUMBAR FUSION  04/22/2011    L4-L5 PLIF USING DePUY SPACER, BMP, AUTOGRAFT, LATERAL FUSION USING SYNTHES SCREWS    THUMB ARTHROSCOPY  1995    TONSILLECTOMY AND ADENOIDECTOMY  1972        Home  Medications:    Prior to Admission medications    Medication Sig Start Date End Date Taking? Authorizing Provider   oxyCODONE-acetaminophen (PERCOCET) 10-325 MG per tablet Take 1 tablet by mouth every 8 hours as needed for Pain for up to 30 doses. Intended supply: 30 days Max Daily Amount: 3 tablets 01/02/23 01/11/23  Lorna Few, MD   doxycycline hyclate (VIBRA-TABS) 100 MG tablet Take 1 tablet by mouth 2 times daily for 7 days  Patient taking differently: Take 1 tablet by mouth 2 times daily ( X 7 days.  Started on 7/27 ) 01/02/23 01/09/23  Lorna Few, MD   amoxicillin-clavulanate (AUGMENTIN) 875-125 MG per tablet Take 1 tablet by mouth 2 times daily for 7 days  Patient taking differently: Take 1 tablet by mouth 2 times daily ( X 7 days.  Started on 7/27 ) 01/02/23 01/09/23  Lorna Few, MD   oxyCODONE (OXYCONTIN) 20 MG extended release tablet Take 1 tablet by mouth 2 times daily for 30 days. Intended supply: 30 days Max Daily Amount: 40 mg 12/26/22 01/25/23  Yvonne Kendall, APRN - NP   omeprazole (PRILOSEC) 40 MG delayed release capsule Take 1 capsule by mouth daily 12/26/22   Yvonne Kendall, APRN - NP   Handicap Placard MISC by Does not apply route 12/22/22   Yvonne Kendall, APRN - NP   Magic Mouthwash (MIRACLE MOUTHWASH) Swish and spit 5 mLs 4 times daily as needed for Irritation Diphenhyddramine: nystatin: lidocaine: maalox 11/17/22   Dollinger, Stefan Church, APRN - NP   simethicone (MYLICON) 80 MG chewable tablet Take 1 capsule by mouth 2 times daily    [provider]   Docusate Sodium 100 MG TABS Take 1 tablet by mouth 2 times daily    [provider]   ondansetron (ZOFRAN-ODT) 8 MG TBDP disintegrating tablet Take 1 tablet by mouth every 8 hours as needed for Nausea or Vomiting (nausea, vomiting) 11/06/22   Yvonne Kendall, APRN - NP   promethazine (PHENERGAN) 25 MG tablet Take 1 tablet by mouth every 6 hours as needed for Nausea  Patient taking differently: Take 1 tablet  by mouth nightly 11/06/22   Yvonne Kendall, APRN - NP   Polyethylene Glycol 3350 (MIRALAX PO) Take 17 g by mouth 2  times daily 04/11/21   [provider]   Naproxen Sodium (ALEVE) 220 MG CAPS Take 1 tablet by mouth 2 times daily    [provider]        Allergies:    Codeine, Dilaudid [hydromorphone hcl], Hydrocodone, Pregabalin, Tramadol hcl, Duloxetine hcl, Gabapentin, and Hydrocodone-acetaminophen    Social History:    Tobacco:   reports that he has never smoked. He has never used smokeless tobacco.  Alcohol:   reports that he does not currently use alcohol.  Drug:   Social History     Substance and Sexual Activity   Drug Use Never        Family History:       Problem Relation Age of Onset    Heart Failure Father     Diabetes Father     Cancer Father         Bladder Cancer    Diabetes Paternal Grandmother     Heart Failure Paternal Grandfather         Vitals:    BP 96/65   Pulse 97   Temp 97.9 F (36.6 C) (Oral)   Resp 18   Ht 1.753 m (5\' 9" )   Wt 80.6 kg (177 lb 9.6 oz)   SpO2 100%   BMI 26.23 kg/m     PHYSICAL EXAM:    General: fatigued but oriented, no acute distress  Eye: PERRL, EOMI  HEENT: dry oral mucosa, no scleral icterus, severe bilateral temporal wasting noted  Neck: full ROM  Lungs: non-labored respiration on RA, equal chest rise bilaterally  Neurologic: no seizure or myoclonus  Psychiatric: normal mood and affect  Skin: no rashes or lesions    DATA:    CBC:   Recent Labs     01/06/23  1831 01/07/23  0611   WBC 22.7* 17.0*   HGB 10.3* 7.8*   PLT 284 204     Last 3 CMP:   Recent Labs     01/06/23  1831 01/06/23  1939 01/07/23  0611   NA 125*  --  131*   K Hemolyzed* 4.6 4.4   CL 93*  --  100   CO2 18*  --  16*   BUN 21  --  18   CREATININE 0.4*  --  0.4*   GLUCOSE 95  --  77   CALCIUM 7.6*  --  7.0*   BILITOT 1.59*  --  1.30*   ALKPHOS 649*  --  505*   AST 306*  --  215*   ALT 50  --  35        Imaging:    CT ABDOMEN PELVIS W IV CONTRAST Additional Contrast? None    Result  Date: 01/06/2023  1. Slightly larger small bilateral pleural effusions. Increased now large quantity abdominal pelvic ascites. Peritoneal thickening again noted-given background extensive metastatic disease elsewhere this may represent carcinomatosis rather than infectious or inflammatory peritonitis. 2. Short term stability extensive pulmonary metastatic nodules and large coalescing hepatic metastatic lesions. Abdominal/retroperitoneal adenopathy as before.         Assessment/ Plan  1. Generalized abdominal pain    2. Lactic acidosis    3. Nausea and vomiting, unspecified vomiting type    4. Leukocytosis, unspecified type    5. Other ascites       61yoM with metastatic gastric cancer and OSA on CPAP who was recently admitted for sepsis of unclear origin, and re-presented four days after discharge with abdominal  pain and throat pain. He was found to have concern for thrush as well as sepsis.     Palliative care team was consulted for goals of care.    Appropriate for hospice care if/when patient and family are ready to stop cancer treatment.    Acute on chronic cancer-related abdominal pain - Changed percocet to 10mg  q4h prn to better reflect home regimen. Agree with increase in long-acting to oxycontin 30mg  q12h.  Added senna for OIC.    Opioids are high-risk medications that require careful and intense monitoring for efficacy and adverse effects.      Goals of care: pt says his "bride is not ready for me to stop fighting" so he wants to talk to oncology about further cancer treatment options  Surrogate medical decision-maker: wife Dondra Spry  Code status: DNR established prior to my consult, did not readdress    Advance directive: none on file    Amedeo Kinsman, MD  Palliative Care    Plan of care regarding patient's condition and preferences was discussed with the interdisciplinary team members to include: RN, CM, hospitalist, oncology NP    I assessed understanding of illness, reviewed information and answered  questions.    patient voluntarily engaged in advance care planning. These additional people were present: none.  Advance directives were appropriately explained, as documented in the subjective above, including any change in health care wishes if the patient becomes unable to make their own decisions.  Time spent discussing ACP during the face-to-face encounter: 20 minutes

## 2023-01-07 NOTE — Care Coordination-Inpatient (Signed)
01/07/23:  BMP  CM Note.  IA/IM completed.  Readmit:  lactic acidosis/hx:  gastric cancer.  Per pt:  meeting with Palliative Care on 01/08/23, after pt and spouse talk to Dr Clemon Chambers, RE:  treatment options.  CM following for d/c planning with [facility] team.   01/07/23 1514   Service Assessment   Patient Orientation Alert and Oriented;Person;Place   Cognition Alert   History Provided By Patient   Primary Caregiver Self   Support Systems Spouse/Significant Other   PCP Verified by CM Yes  (Dr Eduardo Osier)   Prior Functional Level Assistance with the following:;Mobility;Cooking;Other (see comment)  (transportation)   Current Functional Level   (as above)   Can patient return to prior living arrangement Yes   Ability to make needs known: Good   Family able to assist with home care needs: Yes   Would you like for me to discuss the discharge plan with any other family members/significant others, and if so, who? Yes  (spouse, Ms Delvecchio)   Financial Resources [employer] Resources None   CM/SW Referral Other (see comment)  (D/C planning)   Social/Functional History   Lives With Spouse   Type of Home House   Home Layout One level   Home Access Stairs to enter with rails   Entrance Stairs - Number of Steps 3   Entrance Stairs - Rails Left   Bathroom Shower/Tub Tub/Shower unit   Management consultant - 4-Wheeled   Receives Help From Family   ADL Assistance Independent   Homemaking Assistance Independent   Ambulation Assistance Independent   Transfer Assistance Independent   Active Driver No   Patient's Driver Info spouse asssits   Mode of Engineer, water   Occupation Retired   Type of Occupation Pipe/steam fitter

## 2023-01-07 NOTE — Progress Notes (Signed)
Inpatient      Acute Care Speech Pathology Dysphagia Evaluation  Acknowledge Orders  Time In/Out  SLP Charge Capture  Rehab Caseload Tracker    Jesse Savage   10/12/1960  [id number]   01/07/2023        Therapy Minute Tracking       SLP Individual Minutes 0828  0840  12            Lactic acid acidosis     Therapy Orders SLP Eval and Treat    Reason For Referral Dysphagia R13.10    Past Medical History  Past Medical History:   Diagnosis Date    Back pain     Lumbar disc disease     OSA (obstructive sleep apnea)     Doesnt use machine    Sciatica        Past Surgical History      Procedure Laterality Date    BACK SURGERY  06/2010    L5-S1 REMOVAL OF SCREWS    CERVICAL SPINE SURGERY  08/26/2019    C5-C6 ACDF    FACIAL SURGERY  1981    HERNIA REPAIR  2002    IR PORT PLACEMENT EQUAL OR GREATER THAN 5 YEARS  10/31/2022    IR PORT PLACEMENT EQUAL OR GREATER THAN 5 YEARS 10/31/2022 Tana Conch, MD [facility]    LUMBAR FUSION  04/22/2011    L4-L5 PLIF USING DePUY SPACER, BMP, AUTOGRAFT, LATERAL FUSION USING SYNTHES SCREWS    THUMB ARTHROSCOPY  1995    TONSILLECTOMY AND ADENOIDECTOMY  1972       Allergies   Allergies   Allergen Reactions    Codeine      Other reaction(s): itch  Other reaction(s): NA    Dilaudid [Hydromorphone Hcl]      Other reaction(s): malaise    Hydrocodone      Other reaction(s): itch    Pregabalin      Other reaction(s): neurotic episodes    Tramadol Hcl      Other reaction(s): malaise    Duloxetine Hcl Nausea And Vomiting     Other reaction(s): hypertension    Other reaction(s): NA    Gabapentin Nausea And Vomiting     Other reaction(s): sleepy    Other reaction(s): dizzy    Other reaction(s): NA    Hydrocodone-Acetaminophen Nausea And Vomiting        General  General  Chart Reviewed: Yes  General Comment  Comments: Jesse Savage is a 62 y.o. male who presented to the ED with complaints of intractable generalized abdominal tenderness as well as difficulty swallowing. Of note, patient was  hospitalized from 7/22-26 for similar complaints aside from swallowing issues. He was empirically started on Zosyn/Vanc and imaging obtained. Attempt was made to perform paracentesis but no fluid notable for collection. Nothing grew on previous cultures obtained though. He was seen by Palliative/Oncology and made DNR. Pain control was given. He reports that he was tired of being "stuck" and requested discharge even though he wasn't completely medically optimized so was given PO Abx.  Subjective  Subjective: Sitting upright in chair. Reporting painful swallowing, but agreeable to take PO. Pain worse as consistencies increased.  Behavior/Cognition  Behavior/Cognition: Alert, Cooperative, Pleasant mood    Assessment  Subjective  Subjective: Sitting upright in chair. Reporting painful swallowing, but agreeable to take PO. Pain worse as consistencies increased.  Baseline Assessment  Communication Observation: Functional  Follows Directions: Simple  Current Diet : NPO  Current  Liquid Diet : NPO  Dentition: Adequate  Patient Positioning: Upright in bed  Baseline Vocal Quality: Normal     Pain- painful swallowing reported       Vision and Hearing  Hearing  Hearing: Within functional limits    Oral Motor  Labial: No impairment  Oral Hygiene: Dried secretions  Oral Hygiene Comments: concern for thrush on tongue; dried debries unable to be removed c aggressive oral care  Lingual: No impairment  Velum: No Impairment  Mandible: No impairment     Oral Pharyngeal- Declined soft trials, but swallowing function of both pureed and thin liquids via consecutive swallows found to be [facility]. Pain increased  c pureed but less pain reported c liquids. Visible concern for thrush on tongue c MD notified and Nyastatin ordered.   Oral Phase: WFL (adequate mastication and oral control;)     Pharyngeal Phase   Pharyngeal Phase: WFL (adequate hyolaryngeal excursion, clear voicing but c/o pain when swallowing)    Dysphagia Diagnosis  Dysphagia  Diagnosis: Swallow function appears WFL (Suspect painful swallowing could be due to thrush. Evidence of potential thrush noted on tongue . MD notified. ST rec liquids only for now c advancement to soft as pain decreases and pt able to take solids s pain was discussed c Dr Jena Gauss.)  Dysphagia Outcome Severity Scale: Level 6: Within functional limits/Modified independence    Recommendations  Requires SLP Intervention: No (order MBSS if painful swallowing persists and visualization of swallowing mechanism needed; Nyastatin ordered today by MD)  Recommendations: Other (comments) (liquids for now, advance to soft as tolerated)  Diet Solids Recommendation: NPO (advance to soft as pain decreases)  Liquid Consistency Recommendation: Thin  Recommended Form of Meds: PO       Safety- left in chair c call bell in lap

## 2023-01-07 NOTE — Progress Notes (Signed)
Physical Therapy Attempt Note    Attempted to see patient this AM for Physical Therapy evaluation session. Patient declined. Pt just returning from paracentesis. Pt reporting needs to sleep. Will follow and re-attempt tomorrow per pt preference.    Claudine Mouton    Rehab Caseload Tracker

## 2023-01-07 NOTE — Care Coordination-Inpatient (Signed)
01/07/23 1518   IMM Letter   IMM Letter given to Patient/Family/Significant other/Guardian/POA/by: BPaulson BSN RN   IMM Letter date given: 01/07/23   IMM Letter time given: 1518     Placed in chart.

## 2023-01-07 NOTE — Progress Notes (Signed)
[facility] Hospitalist Service    Progress Note    Assessment and Plan:     62 y.o. male who presented to the hospital on 01/06/2023     Concern for sepsis  As before when previously admitted found to have elevated LA, tachycardia, elevated WBC, and concerns of peritonitis. 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 Reviewed discharge summary and appears that patient was treated w/ Zosyn empirically but no growth on culture. MRSA swab negative. US paracentesis attempted but no fluid to tap. Imaging at this time showing increase in pelvic ascites so will attempt to get US paracentesis for cell count/culture. Continue on empiric Zosyn and IVF for now. Follow up cultures obtained and labs. NPO for now.      Stg IV gastric cancer, squamous cell differentiation-PD-L1 positive, HER 2 positive   Consult Oncology/Palliative. Code status DNR.  Notably uncomfortable due to pain.  OxyContin increased.  I discussed hospice with patient and wife.  He would be open to this.  Will defer further discussions to oncology palliative care.  20 minutes advance care planning.     Odynophagia  Recent CT chest obtained showing worsening pulm met's but no esophageal issues. Patient states he is unable to tolerate taking pills but able to tolerate liquids. Given his immunosuppressed state he could have developed esophageal candidiasis so IV fluconazole started empirically.  Also p.o. nystatin ordered.  Speech following and continuing with soft diet as tolerated.     Hyponatremia  Improving with IV fluid.  Continue to monitor.     OSA  Intolerant to CPAP.      Anemia of chronic disease/Iron def anemia  Hb 8.7 at time of last check but elevated to 10.3 on admission. Likely due to some hemoconcentration from poor oral intake. Will monitor for need of transfusion.      Severe protein-calorie malnutrition  Dietitian consulted.     Diet: NPO for paracentesis and then soft diet  DVT ppx: Lovenox  Code Status: DNR  Disposition:  Significant decline since last week so goals of care discussions ongoing.    Total time 50 minutes spent obtaining and/or reviewing patient's history, performing a medically appropriate exam, ordering/reviewing medications, tests, and procedures, educating patient, and documenting clinical information into the electronic health record.    This note was created using voice recognition software and may contain typographic errors missed during final review. The intent is to have a complete and accurate medical record.  As a valued partner in this safety effort, if you have noted factual errors, please complete the Health Information Amendment/Correct Form or call the [facility] Health Information Management Office at [phone].    For any questions, please contact Team I    Subjective:     Continued profound weakness and severe pain.    Objective:      Vitals:    01/07/23 1025   BP: 92/64   Pulse: 97   Resp: 18   Temp:    SpO2: 100%      Physical Exam:  GEN: Acutel and chronically y ill.  RESP:  CTAB.  No w/r/r.  No increased work of breathing.  CV: RRR no m/r/g.  No edema.    GI: Firm and distended and diffusely tender.  Bowel sounds present.  MSK: Diffuse muscle wasting.  NEURO/PSYCH:  Alert, oriented to name, place and time.  Non-focal.  Calm and cooperative.   SKIN:  no rashes, no lesions, no jaundice.  Meds:  Scheduled Meds:   pantoprazole  40 mg IntraVENous Daily    [START ON 01/08/2023] fluconazole  200 mg IntraVENous Q24H    nystatin  5 mL Oral 4x Daily    oxyCODONE  30 mg Oral BID    sodium chloride flush  5-40 mL IntraVENous 2 times per day    enoxaparin  40 mg SubCUTAneous Daily    piperacillin-tazobactam  3,375 mg IntraVENous Q8H     Continuous Infusions:   sodium chloride      sodium chloride 75 mL/hr at 01/06/23 2242     PRN Meds:oxyCODONE-acetaminophen, lidocaine PF, sodium chloride flush, sodium chloride, ondansetron **OR** ondansetron, melatonin, polyethylene glycol, bisacodyl, aluminum & magnesium  hydroxide-simethicone, acetaminophen **OR** acetaminophen, morphine    Home Meds:  Prior to Admission medications    Medication Sig Start Date End Date Taking? Authorizing Provider   oxyCODONE-acetaminophen (PERCOCET) 10-325 MG per tablet Take 1 tablet by mouth every 8 hours as needed for Pain for up to 30 doses. Intended supply: 30 days Max Daily Amount: 3 tablets 01/02/23 01/11/23  Lorna Few, MD   doxycycline hyclate (VIBRA-TABS) 100 MG tablet Take 1 tablet by mouth 2 times daily for 7 days  Patient taking differently: Take 1 tablet by mouth 2 times daily ( X 7 days.  Started on 7/27 ) 01/02/23 01/09/23  Lorna Few, MD   amoxicillin-clavulanate (AUGMENTIN) 875-125 MG per tablet Take 1 tablet by mouth 2 times daily for 7 days  Patient taking differently: Take 1 tablet by mouth 2 times daily ( X 7 days.  Started on 7/27 ) 01/02/23 01/09/23  Lorna Few, MD   oxyCODONE (OXYCONTIN) 20 MG extended release tablet Take 1 tablet by mouth 2 times daily for 30 days. Intended supply: 30 days Max Daily Amount: 40 mg 12/26/22 01/25/23  Yvonne Kendall, APRN - NP   omeprazole (PRILOSEC) 40 MG delayed release capsule Take 1 capsule by mouth daily 12/26/22   Yvonne Kendall, APRN - NP   Handicap Placard MISC by Does not apply route 12/22/22   Yvonne Kendall, APRN - NP   Magic Mouthwash (MIRACLE MOUTHWASH) Swish and spit 5 mLs 4 times daily as needed for Irritation Diphenhyddramine: nystatin: lidocaine: maalox 11/17/22   Dollinger, Stefan Church, APRN - NP   simethicone (MYLICON) 80 MG chewable tablet Take 1 capsule by mouth 2 times daily    [provider]   Docusate Sodium 100 MG TABS Take 1 tablet by mouth 2 times daily    [provider]   ondansetron (ZOFRAN-ODT) 8 MG TBDP disintegrating tablet Take 1 tablet by mouth every 8 hours as needed for Nausea or Vomiting (nausea, vomiting) 11/06/22   Yvonne Kendall, APRN - NP   promethazine (PHENERGAN) 25 MG tablet Take 1 tablet by mouth every 6  hours as needed for Nausea  Patient taking differently: Take 1 tablet by mouth nightly 11/06/22   Yvonne Kendall, APRN - NP   Polyethylene Glycol 3350 (MIRALAX PO) Take 17 g by mouth 2 times daily 04/11/21   [provider]   Naproxen Sodium (ALEVE) 220 MG CAPS Take 1 tablet by mouth 2 times daily    [provider]        Imaging:  CT ABDOMEN PELVIS W IV CONTRAST Additional Contrast? None    Result Date: 01/06/2023  CT abdomen pelvis with contrast: 01/06/23 INDICATION: "recent admission for possible peritonitis, continued abd pain, tachycardia". COMPARISON: 12/29/2022 TECHNIQUE: Routine protocol (  Axial portal venous phase imaging from the lung bases to the pubic symphysis following IV contrast with coronal reconstruction.)  CT scanning was performed using radiation dose reduction techniques when appropriate, per system protocols. FINDINGS: Small bilateral pleural effusions-slight increase in size compared to recent prior. Numerous metastatic pulmonary nodules similar to prior. Widespread coalescing hepatic metastatic disease as before. Large lesion within the lateral  margin of the liver left and right hepatic lobes on image 30 spans 18.5 cm. Spleen, pancreas and adrenal glands stable in appearance. Kidneys nonobstructed.  No bowel obstruction. Large quantity of abdominal pelvic ascites increased compared to prior. Nonspecific peritoneal thickening most pronounced within the pelvis. Metastatic adenopathy upper abdomen, retroperitoneum-stable. Left periaortic lymph node measures 1.7 cm image 48. Portal vein remains patent. Abdominal aorta normal in caliber. Moderate diffuse anasarca. Lumbar disc degeneration and spondylosis, no aggressive bone lesions. Lower lumbar left pedicle fixation as before.     1. Slightly larger small bilateral pleural effusions. Increased now large quantity abdominal pelvic ascites. Peritoneal thickening again noted-given background extensive metastatic disease elsewhere  this may represent carcinomatosis rather than infectious or inflammatory peritonitis. 2. Short term stability extensive pulmonary metastatic nodules and large coalescing hepatic metastatic lesions. Abdominal/retroperitoneal adenopathy as before.     12/29/22    ECHO (TTE) COMPLETE (PRN CONTRAST/BUBBLE/STRAIN/3D) 01/01/2023  5:05 PM (Final)    Interpretation Summary    Left Ventricle: Low normal left ventricular systolic function with a visually estimated EF of 60 - 65%. Left ventricle is smaller than normal. Mild septal thickening. Normal wall motion. Technical limitations preclude assessment of global longitudinal strain.    Right Ventricle: Right ventricle is mildly dilated. Normal systolic function. TAPSE is normal. TDI systolic excursion is normal.    Image quality is technically difficult. Technically difficult study due to patient's body habitus, limited Doppler study due to patient's condition and technically difficult study due to low parasternal window.    This study was given to me to read today.    Signed by: Lyn Hollingshead, MD on 01/01/2023  5:05 PM       Lab Review   Lab Results   Component Value Date/Time    NA 131 01/07/2023 06:11 AM    K 4.4 01/07/2023 06:11 AM    CL 100 01/07/2023 06:11 AM    CO2 16 01/07/2023 06:11 AM    BUN 18 01/07/2023 06:11 AM    CREATININE 0.4 01/07/2023 06:11 AM    GLUCOSE 77 01/07/2023 06:11 AM    CALCIUM 7.0 01/07/2023 06:11 AM     Lab Results   Component Value Date/Time    WBC 17.0 01/07/2023 06:11 AM    HGB 7.8 01/07/2023 06:11 AM    HCT 26.2 01/07/2023 06:11 AM    MCV 83.7 01/07/2023 06:11 AM    PLT 204 01/07/2023 06:11 AM       Microbiology:   BLOOD CX #1  No results for input(s): "BC" in the last 72 hours.  BLOOD CX #2  No results for input(s): "BLOODCULT2" in the last 72 hours.   CULTURE, RESPIRATORY   No results found for: "CULTRESP"  Cultures : No results found for: "ORG"  No results found for: "WNDABS"  Blood culture   No results found for: "BC"

## 2023-01-07 NOTE — Progress Notes (Signed)
Occupational Therapy Attempt Note  Rehab Caseload Tracker      Attempted to see patient this AM for Occupational Therapy evaluation session. Patient just returning from IR and requesting to rest. Will follow and re-attempt as schedule permits/patient available.    Ruben Reason, OT

## 2023-01-07 NOTE — Consults (Signed)
CHIEF COMPLAINT:      Reason for Consult:    Stage IV gastric cancer with worsening abdominal pain     Consultation Requested by:    Dr. Katrinka Blazing    HISTORY OF PRESENT ILLNESS:    Jesse Savage is a 62 year old male well-known to Dr. Evalyn Casco and me for the treatment of metastatic gastric squamous cell carcinoma.  Unfortunately, he has recently progressed on FOLFOX + Opdivo.  He was recently admitted from 7/22-26 for abdominal pain and dysphagia.  He was empirically treated during that hospitalization with Zosyn + vancomycin for peritonitis.  Paracentesis was attempted but no notable fluid was collected.  Blood cultures were negative.  He was discharged home on p.o. antibiotic he represented to the emergency department yesterday reporting worsening abdominal pain and dysphagia.  Due to dysphagia, he was not able to take his p.o. antibiotics.  Zosyn and IV fluid were started in the emergency department.  He was given morphine for pain control.  Repeat CT abdomen pelvis was obtained which showed slightly larger small bilateral pleural effusions.  There was increasing large quantity abdominal pelvic ascites.  Peritoneal thickening was again noted given background extensive metastatic disease elsewhere this may have represented carcinomatosis rather than infection or inflammatory peritonitis.  It again showed stable extensive pulmonary metastases and a large coalescing hepatic metastatic lesion.  It again showed stable abdominal/retroperitoneal adenopathy.  He was admitted to the hospital service for further evaluation.  Unfortunately, when we came for interview with the patient, he was down at interventional radiology undergoing a paracentesis.  During his previous hospitalization, we had discussed potentially treating with palliative Herceptin.    Oncology History  Metastatic gastric cancer, squamous cell differentiation-PD-L1 positive, HER2 positive  Originally presenting to the emergency department with  a 20 pound weight loss, abdominal discomfort, and low grade fevers  CT abdomen/pelvis 10/25/22: There is a gastric mass at the gastric fundus measuring approximately 4.2 x 4.0 cm in size with multiple abnormal hepatic masses, multiple abnormally enlarged   lymph nodes, and multiple abnormal pulmonary nodules likely representing   extensive metastatic disease.  CTA chest 10/25/22: No evidence of acute pulmonary embolism as clinically queried. No pneumonia. No pleural effusion. No pneumothorax. Multiple bilateral pulmonary nodules with multiple hepatic lesions most likely representing extensive metastatic disease.  5/22  PET/CT 10/31/22: Intensely hypermetabolic gastric cardia mass with associated regional   hypermetabolic lymphadenopathy and more distant, fairly extensive pulmonary and   hepatic metastases.  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, squamous cell  10/31/22 IR Port placement  11/10/22 cycle 1 day 1 FOLFOX  11/24/22 added Nivolumab   12/16/22 CT abdomen/pelvis: Progression of neoplasm at all pre-existing sites, multiple coiling lesions in the lung bases, dominant left-sided lesion 3.3 cm previously 2.4 cm, extensive intrahepatic metastatic burden also increased, representative lesion 5 cm previously 4 cm, increased size of bulky low-density upper abdominal adenopathy including periaortic distribution, prominent gastric cardia mass increased in prominence    Past Medical History:     has a past medical history of Back pain, Lumbar disc disease, OSA (obstructive sleep apnea), and Sciatica.   Past Surgical History:    Past Surgical History:   Procedure Laterality Date    BACK SURGERY  06/2010    L5-S1 REMOVAL OF SCREWS    CERVICAL SPINE SURGERY  08/26/2019    C5-C6 ACDF    FACIAL SURGERY  1981  HERNIA REPAIR  2002    IR PORT PLACEMENT EQUAL OR GREATER THAN 5 YEARS  10/31/2022    IR PORT PLACEMENT EQUAL OR GREATER THAN 5 YEARS  10/31/2022 Tana Conch, MD [facility] IR    LUMBAR FUSION  04/22/2011    L4-L5 PLIF USING DePUY SPACER, BMP, AUTOGRAFT, LATERAL FUSION USING SYNTHES SCREWS    THUMB ARTHROSCOPY  1995    TONSILLECTOMY AND ADENOIDECTOMY  1972      Current Medications:     pantoprazole  40 mg IntraVENous Daily    [START ON 01/08/2023] fluconazole  200 mg IntraVENous Q24H    oxyCODONE  20 mg Oral BID    sodium chloride flush  5-40 mL IntraVENous 2 times per day    enoxaparin  40 mg SubCUTAneous Daily    piperacillin-tazobactam  3,375 mg IntraVENous Q8H     Allergies:    Allergies   Allergen Reactions    Codeine      Other reaction(s): itch  Other reaction(s): NA    Dilaudid [Hydromorphone Hcl]      Other reaction(s): malaise    Hydrocodone      Other reaction(s): itch    Pregabalin      Other reaction(s): neurotic episodes    Tramadol Hcl      Other reaction(s): malaise    Duloxetine Hcl Nausea And Vomiting     Other reaction(s): hypertension    Other reaction(s): NA    Gabapentin Nausea And Vomiting     Other reaction(s): sleepy    Other reaction(s): dizzy    Other reaction(s): NA    Hydrocodone-Acetaminophen Nausea And Vomiting      Social History:    reports that he has never smoked. He has never used smokeless tobacco. He reports that he does not currently use alcohol. He reports that he does not use drugs.   Family History:     family history includes Cancer in his father; Diabetes in his father and paternal grandmother; Heart Failure in his father and paternal grandfather.     REVIEW OF SYSTEMS:    Not able to obtain as patient is in IR      PHYSICAL EXAM:    Vitals:    Vitals:    01/07/23 0724   BP:    Pulse:    Resp: 18   Temp:    SpO2:       Not able to obtain as patient is in IR    LABORATORY DATA:  Recent Results (from the past 24 hour(s))   CBC with Auto Differential    Collection Time: 01/06/23  6:31 PM   Result Value Ref Range    WBC 22.7 (H) 3.8 - 10.6 x10e3/mcL    RBC 4.13 4.00 - 5.60 x10e6/mcL    Hemoglobin 10.3 (L) 13.0 -  17.3 g/dL    Hematocrit 16.1 (L) 38.0 - 52.0 %    MCV 80.9 (L) 84.0 - 100.0 fL    MCH 24.9 (L) 27.0 - 34.5 pg    MCHC 30.8 30.0 - 36.0 g/dL    RDW 09.6 (H) 04.5 - 17.0 %    Platelets 284 140 - 440 x10e3/mcL    MPV 10.4 7.0 - 12.2 fL    NRBC Automated 0.1 0.0 - 0.2 %    NRBC Absolute 0.020 (H) 0.000 - 0.012 x10e3/mcL   Comprehensive Metabolic Panel    Collection Time: 01/06/23  6:31 PM   Result Value Ref Range    Sodium 125 (L) 135 -  145 mmol/L    Potassium Hemolyzed (A) 3.5 - 5.3 mmol/L    Chloride 93 (L) 98 - 107 mmol/L    CO2 18 (L) 22 - 29 mmol/L    Glucose 95 70 - 99 mg/dL    BUN 21 8 - 23 mg/dL    Creatinine 0.4 (L) 0.7 - 1.3 mg/dL    Anion Gap 14 2 - 17 mmol/L    Osmolaliy Calculated 254 (L) 270 - 287 mOsm/kg    Calcium 7.6 (L) 8.5 - 10.7 mg/dL    CALCIUM,CORRECTED,CCA 9.4 8.5 - 10.7 mg/dL    Total Protein 6.2 5.7 - 8.3 g/dL    Albumin 1.7 (L) 3.5 - 5.2 g/dL    Globulin 4.5 (H) 1.9 - 4.4 g/dL    Albumin/Globulin Ratio 0.40 (L) 1.00 - 2.70    Total Bilirubin 1.59 (H) 0.00 - 1.20 mg/dL    Alk Phosphatase 295 (H) 40 - 130 unit/L    AST 306 (H) 0 - 50 unit/L    ALT 50 0 - 50 unit/L    Est, Glom Filt Rate 123 >=60 mL/min/1.26m   Lactate, Sepsis    Collection Time: 01/06/23  6:31 PM   Result Value Ref Range    Lactate, Sepsis 4.5 (HH) 0.5 - 2.0 mmol/L   Lipase    Collection Time: 01/06/23  6:31 PM   Result Value Ref Range    Lipase 46 13 - 60 unit/L   Magnesium    Collection Time: 01/06/23  6:31 PM   Result Value Ref Range    Magnesium 2.5 1.6 - 2.6 mg/dL   Manual Differential    Collection Time: 01/06/23  6:31 PM   Result Value Ref Range    Neutrophils %. Manual count 85 (H) 42 - 74 %    Bands Relative 1 0 - 5 %    Lymphocytes 6 (L) 15 - 45 %    Monocytes % 8 4 - 12 %    Neutrophils Absolute 19.3 (H) 1.6 - 7.3 x10e3/mcL    Absolute Bands 0.2 x10e3/mcL    Lymphocytes Absolute 1.4 1.0 - 3.2 x10e3/mcL    Monocytes Absolute 1.8 (H) 0.3 - 1.0 x10e3/mcL    Platelet Estimate Adequate     RBC Morphology Abnormal (A)  Normal    Polychromasia Few (A)     Microcytes Few (A)     Macrocytes Few (A)     Ovalocytes Few (A)    Potassium    Collection Time: 01/06/23  7:39 PM   Result Value Ref Range    Potassium 4.6 3.5 - 5.3 mmol/L   Lactate, Sepsis    Collection Time: 01/06/23  8:13 PM   Result Value Ref Range    Lactate, Sepsis 3.3 (HH) 0.5 - 2.0 mmol/L   Urinalysis W/ Rflx Microscopic    Collection Time: 01/07/23  1:56 AM   Result Value Ref Range    Color, UA Dark Yellow     Appearance Cloudy (A) Clear    Glucose, Ur Negative Negative    Bilirubin, Urine Negative Negative    Ketones, Urine Trace mg/dL    Specific Gravity, UA 1.049 1.003 - 1.035    Blood, Urine Small (A) Negative    pH, Urine 5.5 4.5 - 8.0    Protein, UA 30 (A) Negative    Urobilinogen, Urine 1.0 >1.0 EU/dL    Nitrite, Urine Negative Negative    Leukocyte Esterase, Urine Negative Negative   Microscopic Urinalysis    Collection  Time: 01/07/23  1:56 AM   Result Value Ref Range    WBC, UA 3-5 (A) 0 - 2 /HPF    RBC, UA 6-10 (A) 0 - 2 /HPF    Squam Epithel, UA 0-2 3 - 5 /HPF    MUCUS, URINE Not Seen Not Seen /LPF    BACTERIA, URINE Moderate (A) Rare    Amorphous, UA Rare (A) Not Seen /HPF   CBC with Auto Differential    Collection Time: 01/07/23  6:11 AM   Result Value Ref Range    WBC 17.0 (H) 3.8 - 10.6 x10e3/mcL    RBC 3.13 (L) 4.00 - 5.60 x10e6/mcL    Hemoglobin 7.8 (L) 13.0 - 17.3 g/dL    Hematocrit 09.8 (L) 38.0 - 52.0 %    MCV 83.7 (L) 84.0 - 100.0 fL    MCH 24.9 (L) 27.0 - 34.5 pg    MCHC 29.8 (L) 30.0 - 36.0 g/dL    RDW 11.9 (H) 14.7 - 17.0 %    Platelets 204 140 - 440 x10e3/mcL    MPV 9.6 7.0 - 12.2 fL    NRBC Automated 0.0 0.0 - 0.2 %    NRBC Absolute 0.000 0.000 - 0.012 x10e3/mcL    Neutrophils % 81.8 (H) 42.0 - 74.0 %    Lymphocytes 7.8 (L) 15.0 - 45.0 %    Monocytes % 9.0 4.0 - 12.0 %    Eosinophils % 0.0 0.0 - 7.0 %    Basophils % 0.1 0.0 - 2.0 %    Neutrophils Absolute 13.9 (H) 1.6 - 7.3 x10e3/mcL    Lymphocytes Absolute 1.3 1.0 - 3.2 x10e3/mcL    Monocytes  Absolute 1.5 (H) 0.3 - 1.0 x10e3/mcL    Eosinophils Absolute 0.0 0.0 - 0.5 x10e3/mcL    Basophils Absolute 0.0 0.0 - 0.2 x10e3/mcL    Immature Granulocytes % 1.3 (H) 0.0 - 0.6 %    Immature Grans (Abs) 0.22 (H) 0.00 - 0.06 x10e3/mcL   Comprehensive Metabolic Panel    Collection Time: 01/07/23  6:11 AM   Result Value Ref Range    Sodium 131 (L) 135 - 145 mmol/L    Potassium 4.4 3.5 - 5.3 mmol/L    Chloride 100 98 - 107 mmol/L    CO2 16 (L) 22 - 29 mmol/L    Glucose 77 70 - 99 mg/dL    BUN 18 8 - 23 mg/dL    Creatinine 0.4 (L) 0.7 - 1.3 mg/dL    Anion Gap 15 2 - 17 mmol/L    Osmolaliy Calculated 263 (L) 270 - 287 mOsm/kg    Calcium 7.0 (L) 8.5 - 10.7 mg/dL    CALCIUM,CORRECTED,CCA 8.9 8.5 - 10.7 mg/dL    Total Protein 4.8 (L) 5.7 - 8.3 g/dL    Albumin 1.6 (L) 3.5 - 5.2 g/dL    Globulin 3.2 1.9 - 4.4 g/dL    Albumin/Globulin Ratio 0.50 (L) 1.00 - 2.70    Total Bilirubin 1.30 (H) 0.00 - 1.20 mg/dL    Alk Phosphatase 829 (H) 40 - 130 unit/L    AST 215 (H) 0 - 50 unit/L    ALT 35 0 - 50 unit/L    Est, Glom Filt Rate 123 >=60 mL/min/1.43m   Lactic Acid    Collection Time: 01/07/23  6:11 AM   Result Value Ref Range    Lactic Acid 2.5 (H) 0.5 - 2.0 mmol/L        ASSESSMENT/PLAN:    Stage IV gastric  squamous cell carcinoma  Recent imaging showed progressive disease.  We were tentatively planning outpatient palliative treatment with Herceptin.  However, the patient continues to have declining performance status.  The primary team has ordered a palliative care consult as well as hospice consult.  We agree with the initial meeting with hospice to discuss the patient's goals.  Unfortunately, we are not able to meet with him today as he was down at a procedure when we came by.  I discussed with Dr. Arvilla Market that our team agrees with a hospice consult but he does have the option of palliative chemotherapy with Herceptin if he chooses so.    Thank you for the consult! We will continue to follow along while the patient is  hospitalized.    Stefan Church Dollinger, APRN - NP    Attestation:   I formulated the assessment and plan as outlined above.  We will f/u in the am. Thanks    Evalyn Casco., M.D.       Elements of this note have been dictated using speech recognition software. As a result, errors of speech recognition may have occurred.

## 2023-01-08 LAB — CBC WITH AUTO DIFFERENTIAL
Basophils %: 0.3 % (ref 0.0–2.0)
Basophils Absolute: 0 10*3/uL (ref 0.0–0.2)
Eosinophils %: 0.2 % (ref 0.0–7.0)
Eosinophils Absolute: 0 10*3/uL (ref 0.0–0.5)
Hematocrit: 29.2 % — ABNORMAL LOW (ref 38.0–52.0)
Hemoglobin: 8.3 g/dL — ABNORMAL LOW (ref 13.0–17.3)
Immature Grans (Abs): 0.16 10*3/uL — ABNORMAL HIGH (ref 0.00–0.06)
Immature Granulocytes %: 1 % — ABNORMAL HIGH (ref 0.0–0.6)
Lymphocytes Absolute: 1.6 10*3/uL (ref 1.0–3.2)
Lymphocytes: 10.4 % — ABNORMAL LOW (ref 15.0–45.0)
MCH: 24.9 pg — ABNORMAL LOW (ref 27.0–34.5)
MCHC: 28.4 g/dL — ABNORMAL LOW (ref 30.0–36.0)
MCV: 87.4 fL (ref 84.0–100.0)
MPV: 9.8 fL (ref 7.0–12.2)
Monocytes %: 11.4 % (ref 4.0–12.0)
Monocytes Absolute: 1.8 10*3/uL — ABNORMAL HIGH (ref 0.3–1.0)
NRBC Absolute: 0 10*3/uL (ref 0.000–0.012)
NRBC Automated: 0 % (ref 0.0–0.2)
Neutrophils %: 76.7 % — ABNORMAL HIGH (ref 42.0–74.0)
Neutrophils Absolute: 12 10*3/uL — ABNORMAL HIGH (ref 1.6–7.3)
Platelets: 164 10*3/uL (ref 140–440)
RBC: 3.34 x10e6/mcL — ABNORMAL LOW (ref 4.00–5.60)
RDW: 25.2 % — ABNORMAL HIGH (ref 10.0–17.0)
WBC: 15.6 10*3/uL — ABNORMAL HIGH (ref 3.8–10.6)

## 2023-01-08 LAB — COMPREHENSIVE METABOLIC PANEL
ALT: 33 U/L (ref 0–50)
AST: 215 U/L — ABNORMAL HIGH (ref 0–50)
Albumin/Globulin Ratio: 0.4 — ABNORMAL LOW (ref 1.00–2.70)
Albumin: 1.5 g/dL — ABNORMAL LOW (ref 3.5–5.2)
Alk Phosphatase: 504 U/L — ABNORMAL HIGH (ref 40–130)
Anion Gap: 13 mmol/L (ref 2–17)
BUN: 19 mg/dL (ref 8–23)
CALCIUM,CORRECTED,CCA: 9.1 mg/dL (ref 8.5–10.7)
CO2: 16 mmol/L — ABNORMAL LOW (ref 22–29)
Calcium: 7.1 mg/dL — ABNORMAL LOW (ref 8.5–10.7)
Chloride: 99 mmol/L (ref 98–107)
Creatinine: 0.5 mg/dL — ABNORMAL LOW (ref 0.7–1.3)
Est, Glom Filt Rate: 115 mL/min/1.73m (ref >=60)
Globulin: 3.4 g/dL (ref 1.9–4.4)
Glucose: 84 mg/dL (ref 70–99)
Osmolaliy Calculated: 258 mOsm/kg — ABNORMAL LOW (ref 270–287)
Potassium: 4.6 mmol/L (ref 3.5–5.3)
Sodium: 128 mmol/L — ABNORMAL LOW (ref 135–145)
Total Bilirubin: 1.46 mg/dL — ABNORMAL HIGH (ref 0.00–1.20)
Total Protein: 4.9 g/dL — ABNORMAL LOW (ref 5.7–8.3)

## 2023-01-08 LAB — CULTURE, BODY FLUID: Gram Stain Result: NONE SEEN

## 2023-01-08 LAB — PHOSPHORUS: Phosphorus: 2.6 mg/dL (ref 2.5–4.5)

## 2023-01-08 LAB — MAGNESIUM: Magnesium: 2.5 mg/dL (ref 1.6–2.6)

## 2023-01-08 LAB — CULTURE, BLOOD 1

## 2023-01-08 MED ORDER — METRONIDAZOLE 500 MG PO TABS
500 MG | ORAL_TABLET | Freq: Two times a day (BID) | ORAL | 0 refills | Status: AC
Start: 2023-01-08 — End: 2023-01-15

## 2023-01-08 MED ORDER — OXYCODONE HCL ER 30 MG PO T12A
30 | Freq: Two times a day (BID) | ORAL | 0 refills | Status: AC
Start: 2023-01-08 — End: 2023-01-15

## 2023-01-08 MED ORDER — OXYCODONE-ACETAMINOPHEN 10-325 MG PO TABS
10-325 | ORAL_TABLET | ORAL | 0 refills | Status: AC | PRN
Start: 2023-01-08 — End: 2023-01-13

## 2023-01-08 MED ORDER — CIPROFLOXACIN HCL 500 MG PO TABS
500 MG | ORAL_TABLET | Freq: Two times a day (BID) | ORAL | 0 refills | Status: AC
Start: 2023-01-08 — End: 2023-01-15

## 2023-01-08 MED FILL — OXYCODONE-ACETAMINOPHEN 10-325 MG PO TABS: 10-325 MG | ORAL | Qty: 1

## 2023-01-08 MED FILL — OXYCONTIN 10 MG PO T12A: 10 MG | ORAL | Qty: 1

## 2023-01-08 MED FILL — PROTONIX 40 MG IV SOLR: 40 MG | INTRAVENOUS | Qty: 40

## 2023-01-08 MED FILL — PIPERACILLIN SOD-TAZOBACTAM SO 3.375 (3-0.375) G IV SOLR: 3.375 (3-0.375) g | INTRAVENOUS | Qty: 3375

## 2023-01-08 MED FILL — MORPHINE SULFATE (PF) 4 MG/ML IJ SOLN: 4 mg/mL | INTRAMUSCULAR | Qty: 1

## 2023-01-08 MED FILL — NYSTATIN 100000 UNIT/ML MT SUSP: 100000 UNIT/ML | OROMUCOSAL | Qty: 5

## 2023-01-08 MED FILL — FLUCONAZOLE IN SODIUM CHLORIDE 200-0.9 MG/100ML-% IV SOLN: INTRAVENOUS | Qty: 100

## 2023-01-08 MED FILL — ENOXAPARIN SODIUM 40 MG/0.4ML IJ SOSY: 40 MG/0.4ML | INTRAMUSCULAR | Qty: 0.4

## 2023-01-08 MED FILL — NORMAL SALINE FLUSH 0.9 % IV SOLN: 0.9 % | INTRAVENOUS | Qty: 10

## 2023-01-08 MED FILL — NORMAL SALINE FLUSH 0.9 % IV SOLN: 0.9 % | INTRAVENOUS | Qty: 40

## 2023-01-08 NOTE — Progress Notes (Signed)
Occupational Therapy Attempt Note  Rehab Caseload Tracker      Attempted to see patient this AM for Occupational Therapy evaluation session. Per CM pt is transitioning home with hospice services tomorrow.  At this time IP therapy services are not indicated, but OT did provided education to pt and his wife that hospice can order PT consult PRN for DME and family education.  OT signing off.      Jesse Savage, OT

## 2023-01-08 NOTE — Progress Notes (Signed)
Physical Therapy Attempt Note    Attempted to see patient this AM for Physical Therapy evaluation session. Upon attempt, pt was meeting with MD at bedside. Will follow and re-attempt as schedule permits/patient available.    Cari Caraway [facility]    Rehab Caseload Tracker

## 2023-01-08 NOTE — Progress Notes (Addendum)
01/08/23:  BMP  CM Note.  Dr Clemon Chambers, Palliative met with pt/spouse:  plan for Home Hospice.  CM met with pt/spouse at bedside for d/c planning.  Request referral to [facility].  Referral in Epic/Dr Tuten updated.  D/C planned:  01/09/23 @ 12NN, via ambulance transportation.  CM following for completed d/c planing.  UPDATE:  Pt accepted by [facility].  Initially pt requested for ambulance transportation, then cancelled/requests spouse to transport home.  CM cancelled transportation with [facility].  Spouse to transport at 1200.  Dr Jena Gauss updated on 01/09/23 to home hospice with spouse to transport home.  CM called [facility]:  Oxycodone will be filled by 1000 on 01/09/23.  Spouse, staff RN and charge RN updated.   01/08/23 1059   Encounter Summary   Encounter Overview/Reason Family Care   Service Provided For Family   Referral/Consult From Rounding   Support System Family members;Palliative Care   Last Encounter  01/08/23   Spiritual/Emotional needs   Type Spiritual Support   Grief, Loss, and Adjustments   Type Anticipatory Grief   Palliative Care   Type Palliative Care, Family Care     Provided emotional support to pt's step-daughter (who is a Licensed conveyancer on 4 HVT) in the hallway when rounding on 5 south. Also facilitated reflection about her mother's coping and understanding of the pt's condition/prognosis. 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 She expressed hope for better symptom management for the pt at home.     I provided empathic listening, affirmed her emotions, provided companionship as she waited to enter the room. She was grateful for the support and would appreciate ongoing chaplain support. Chaplains remain available to offer further support and can be reached via Telemediq.      [facility] Pastoral Care [Mon-Friday  8 AM-4 PM]   and   AH_Pastoral Care/Chaplain (After hours and  Weekends)     Chaplain Cameron Ali, MDiv, [facility]  Pastoral Care

## 2023-01-08 NOTE — Progress Notes (Signed)
Subjective:    Patient seen in consult per Dr. Arvilla Market yesterday.  Asked to follow on pain symptoms and opioid regimen adjustments from yesterday.  Received notification Oncology team had also met with patient and spouse earlier this morning with recommendations for transition to home hospice care.    D/w unit RN, CM and hospitalist    Met with patient and spouse at bedside.  Reviewed recommendations, offered support, answered questions, educated about next steps.    Patient and family in agreement with transition to home with support of home hospice services.    Reviewed opioid regimen. Patient not overly sedated, able to answer questions appropriately but is fatigued and generally weak.    Over past 24 hours pain controlled with oxycontin 30mg  bid and 4mg  morphine X4 doses in past 24 hours for breakthrough pain      Scheduled Meds:   pantoprazole  40 mg IntraVENous Daily    fluconazole  200 mg IntraVENous Q24H    nystatin  5 mL Oral 4x Daily    oxyCODONE  30 mg Oral BID    senna  1 tablet Oral Nightly    sodium chloride flush  5-40 mL IntraVENous 2 times per day    enoxaparin  40 mg SubCUTAneous Daily    piperacillin-tazobactam  3,375 mg IntraVENous Q8H     Continuous Infusions:   sodium chloride       PRN Meds:.oxyCODONE-acetaminophen, sodium chloride flush, sodium chloride, ondansetron **OR** ondansetron, melatonin, polyethylene glycol, bisacodyl, aluminum & magnesium hydroxide-simethicone, acetaminophen **OR** acetaminophen, morphine   Allergies:    Codeine, Dilaudid [hydromorphone hcl], Hydrocodone, Pregabalin, Tramadol hcl, Duloxetine hcl, Gabapentin, and Hydrocodone-acetaminophen    Social History:    Tobacco:   reports that he has never smoked. He has never used smokeless tobacco.  Alcohol:   reports that he does not currently use alcohol.  Drug:   Social History     Substance and Sexual Activity   Drug Use Never        Family History:       Problem Relation Age of Onset    Heart Failure Father      Diabetes Father     Cancer Father         Bladder Cancer    Diabetes Paternal Grandmother     Heart Failure Paternal Grandfather         Vitals:    BP 93/65   Pulse 100   Temp 98 F (36.7 C) (Oral)   Resp 18   Ht 1.753 m (5' 9.02")   Wt 81.7 kg (180 lb 1.6 oz)   SpO2 98%   BMI 26.58 kg/m     PHYSICAL EXAM:    General: fatigued but oriented, no acute distress  Eye: PERRL, EOMI  HEENT: dry oral mucosa, no scleral icterus, severe bilateral temporal wasting noted  Neck: full ROM  Lungs: non-labored respiration on RA, equal chest rise bilaterally  Neurologic: no seizure or myoclonus  Psychiatric: normal mood and affect  Skin: no rashes or lesions        DATA:    CBC:   Recent Labs     01/06/23  1831 01/07/23  0611 01/08/23  0456   WBC 22.7* 17.0* 15.6*   HGB 10.3* 7.8* 8.3*   PLT 284 204 164     Last 3 CMP:   Recent Labs     01/06/23  1831 01/06/23  1939 01/07/23  0611 01/08/23  0456   NA 125*  --  131* 128*   K Hemolyzed* 4.6 4.4 4.6   CL 93*  --  100 99   CO2 18*  --  16* 16*   BUN 21  --  18 19   CREATININE 0.4*  --  0.4* 0.5*   GLUCOSE 95  --  77 84   CALCIUM 7.6*  --  7.0* 7.1*   BILITOT 1.59*  --  1.30* 1.46*   ALKPHOS 649*  --  505* 504*   AST 306*  --  215* 215*   ALT 50  --  35 33        Imaging:    CT ABDOMEN PELVIS W IV CONTRAST Additional Contrast? None    Result Date: 01/06/2023  1. Slightly larger small bilateral pleural effusions. Increased now large quantity abdominal pelvic ascites. Peritoneal thickening again noted-given background extensive metastatic disease elsewhere this may represent carcinomatosis rather than infectious or inflammatory peritonitis. 2. Short term stability extensive pulmonary metastatic nodules and large coalescing hepatic metastatic lesions. Abdominal/retroperitoneal adenopathy as before.         Assessment/ Plan  1. Generalized abdominal pain    2. Lactic acidosis    3. Nausea and vomiting, unspecified vomiting type    4. Leukocytosis, unspecified type    5. Other ascites     6. Cancer related pain      61yoM with metastatic gastric cancer and OSA on CPAP who was recently admitted for sepsis of unclear origin, and re-presented four days after discharge with abdominal pain and throat pain. He was found to have concern for thrush as well as sepsis.       Acute on chronic cancer-related abdominal pain - improved no changes to current regimen      Goals of care: Transition home with home hospice support.  Consult placed      I assessed understanding of illness, reviewed information and answered questions.      Time spent discussing ACP during the face-to-face encounter: 20 minutes

## 2023-01-08 NOTE — Progress Notes (Signed)
01/08/23:  BMP  CM Note.  Dr Clemon Chambers, Palliative met with pt/spouse:  plan for Home Hospice.  CM met with pt/spouse at bedside for d/c planning.  Request referral to [facility].  Referral in Epic/Dr Tuten updated.  D/C planned:  01/09/23 @ 12NN, via ambulance transportation.  CM following for completed d/c planing.   01/08/23 1059   Encounter Summary   Encounter Overview/Reason Family Care   Service Provided For Family   Referral/Consult From Rounding   Support System Family members;Palliative Care   Last Encounter  01/08/23   Spiritual/Emotional needs   Type Spiritual Support   Grief, Loss, and Adjustments   Type Anticipatory Grief   Palliative Care   Type Palliative Care, Family Care     Provided emotional support to pt's step-daughter (who is a Licensed conveyancer on 4 HVT) in the hallway when rounding on 5 south. Also facilitated reflection about her mother's coping and understanding of the pt's condition/prognosis. 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 She expressed hope for better symptom management for the pt at home.     I provided empathic listening, affirmed her emotions, provided companionship as she waited to enter the room. She was grateful for the support and would appreciate ongoing chaplain support. Chaplains remain available to offer further support and can be reached via Telemediq.      [facility] Pastoral Care [Mon-Friday  8 AM-4 PM]   and   AH_Pastoral Care/Chaplain (After hours and Weekends)     Chaplain Cameron Ali, MDiv, [facility]  Pastoral Care

## 2023-01-08 NOTE — Progress Notes (Signed)
Physical Therapy Attempt Note    Attempted to see patient this AM for Physical Therapy evaluation session. Skilled IP PT services not indicated at this time given transition to hospice.  Pt may request home PT consult through hospice.  Refer to OT note for details of education. PT signing off.     Cari Caraway [facility]    Rehab Caseload Tracker

## 2023-01-08 NOTE — Progress Notes (Signed)
Nutrition Brief Note    MD c/s Unintentional Weight Loss    Nutrition:  8/1- Pt visited this AM for MD c/s unintentional weight loss; saw yesterday for full assessment per MD c/s poor appetite. Discussed POC with MD. Any further nutrition interventions beyond ONS not appropriate. Pt plans to d/c on hospice tomorrow. Re-ordered previously ordered ONS while inpatient. Family reports having Boost at home. I encouraged adequate PO intake. Please c/s MD for further nutrition interventions/TF recs if POC changes. See 7/31 note for full assessment.    Past Medical History:   Diagnosis Date    Back pain     Lumbar disc disease     OSA (obstructive sleep apnea)     Doesnt use machine    Sciatica      Scheduled Meds:   pantoprazole  40 mg IntraVENous Daily    fluconazole  200 mg IntraVENous Q24H    nystatin  5 mL Oral 4x Daily    oxyCODONE  30 mg Oral BID    senna  1 tablet Oral Nightly    sodium chloride flush  5-40 mL IntraVENous 2 times per day    enoxaparin  40 mg SubCUTAneous Daily    piperacillin-tazobactam  3,375 mg IntraVENous Q8H     Continuous Infusions:   sodium chloride       PRN Meds:.oxyCODONE-acetaminophen, sodium chloride flush, sodium chloride, ondansetron **OR** ondansetron, melatonin, polyethylene glycol, bisacodyl, aluminum & magnesium hydroxide-simethicone, acetaminophen **OR** acetaminophen, morphine  Lab Results   Component Value Date/Time    NA 128 01/08/2023 04:56 AM    K 4.6 01/08/2023 04:56 AM    CL 99 01/08/2023 04:56 AM    CO2 16 01/08/2023 04:56 AM    BUN 19 01/08/2023 04:56 AM    CREATININE 0.5 01/08/2023 04:56 AM    GLUCOSE 84 01/08/2023 04:56 AM    CALCIUM 7.1 01/08/2023 04:56 AM    PHOS 2.6 01/08/2023 04:56 AM    MG 2.5 01/08/2023 04:56 AM     No results found for: "TRIG"  No results found for: "POCGLU"       Nutrition Recommendations/Plan:   Continue current diet. Encouraged adequate PO intake.  Ordered Magic cups TID (870 kcal, 27 g protein).   Noted historically that pt also plans to take  Boost high protein shakes from home TID (750 kcal, 60 g protein). Please offer Ensure Plus HP to replace if pt runs out of Boost.     Electronically signed by Vincente Poli, RD on 01/08/23 at 12:01 PM EDT

## 2023-01-08 NOTE — Consults (Addendum)
ULTRASOUND GUIDED PERIPHERAL IV PLACED    INDICATION: IV Access Required    INSERTED BY: Allayne Stack, RN, CCRN      The patient's RIGHT arm was prepped aseptically. Local anesthesia was obtained with 0.68mL of 1% lidocaine. Noted MID FOREARM vein compressible.  The MID FOREARM  vein was accessed with an 22 gauge gauge catheter 1.75 inches in length. X 1 attempts. Positive blood return and flushed easily.  Visualized catheter within vein lumen via ultrasound.  10 mL of normal saline flush used to place access.  Stat-Lock and transparent dressing applied.    COMPLICATIONS: None    TOLERATED: well with no complications

## 2023-01-08 NOTE — Progress Notes (Signed)
[facility] Hospitalist Service    Progress Note    Assessment and Plan:     62 y.o. male who presented to the hospital on 01/06/2023     Sepsis 2/2 peritonitis  Met sepsis criteria on admission.  Status post paracentesis with cell count consistent with peritonitis.  On Zosyn.  Culture no growth to date.       Stg IV gastric cancer, squamous cell differentiation-PD-L1 positive, HER 2 positive   Consult Oncology/Palliative. Code status DNR.  Pain better controlled with increase in OxyContin.  Goals of care discussions ongoing.  I updated patient and wife at bedside this morning that we would need to decide on risk benefits of further cancer therapies.  Will defer to palliative and oncology for further discussions.  20 minutes advance care planning.       Odynophagia  Recent CT chest obtained showing worsening pulm met's but no esophageal issues. Patient states he is unable to tolerate taking pills but able to tolerate liquids. Given his immunosuppressed state he could have developed esophageal candidiasis so IV fluconazole started empirically.  Also p.o. nystatin ordered.  Speech following and continuing with soft diet as tolerated.     Hyponatremia  Improved with IV fluid.  Continue to monitor.  Expect he will remain in the high 120s to low 130s going forward.     OSA  Intolerant to CPAP.      Anemia of chronic disease/Iron def anemia  Hb 8.7 at time of last check but elevated to 10.3 on admission. Likely due to some hemoconcentration from poor oral intake. Will monitor for need of transfusion.      Severe protein-calorie malnutrition  Dietitian consulted.     Diet: Soft diet  DVT ppx: Lovenox  Code Status: DNR  Disposition: Significant decline since last week so goals of care discussions ongoing.    Total time 50 minutes spent obtaining and/or reviewing patient's history, performing a medically appropriate exam, ordering/reviewing medications, tests, and procedures, educating patient, and documenting clinical information  into the electronic health record.    This note was created using voice recognition software and may contain typographic errors missed during final review. The intent is to have a complete and accurate medical record.  As a valued partner in this safety effort, if you have noted factual errors, please complete the Health Information Amendment/Correct Form or call the [facility] Health Information Management Office at [phone].    For any questions, please contact Team I    Subjective:     Pain better controlled with increase in OxyContin from 20 twice daily to 30 twice daily yesterday.  Drifting off during conversation.  Wife updated at bedside    Objective:      Vitals:    01/08/23 0948   BP:    Pulse:    Resp: 18   Temp:    SpO2:       Physical Exam:  GEN: Acutel and chronically y ill.  RESP:  CTAB.  No w/r/r.  No increased work of breathing.  CV: RRR no m/r/g.  No edema.    GI: Firm and distended and less tender.  Bowel sounds present.  MSK: Diffuse muscle wasting.  NEURO/PSYCH: Somnolent at times, oriented x 3.  Non-focal.  Calm and cooperative.   SKIN:  no rashes, no lesions, no jaundice.      Meds:  Scheduled Meds:   pantoprazole  40 mg IntraVENous Daily    fluconazole  200 mg IntraVENous Q24H  nystatin  5 mL Oral 4x Daily    oxyCODONE  30 mg Oral BID    senna  1 tablet Oral Nightly    sodium chloride flush  5-40 mL IntraVENous 2 times per day    enoxaparin  40 mg SubCUTAneous Daily    piperacillin-tazobactam  3,375 mg IntraVENous Q8H     Continuous Infusions:   sodium chloride      sodium chloride Stopped (01/08/23 0915)     PRN Meds:oxyCODONE-acetaminophen, sodium chloride flush, sodium chloride, ondansetron **OR** ondansetron, melatonin, polyethylene glycol, bisacodyl, aluminum & magnesium hydroxide-simethicone, acetaminophen **OR** acetaminophen, morphine    Home Meds:  Prior to Admission medications    Medication Sig Start Date End Date Taking? Authorizing Provider   oxyCODONE-acetaminophen (PERCOCET)  10-325 MG per tablet Take 1 tablet by mouth every 8 hours as needed for Pain for up to 30 doses. Intended supply: 30 days Max Daily Amount: 3 tablets 01/02/23 01/11/23  Lorna Few, MD   doxycycline hyclate (VIBRA-TABS) 100 MG tablet Take 1 tablet by mouth 2 times daily for 7 days  Patient taking differently: Take 1 tablet by mouth 2 times daily ( X 7 days.  Started on 7/27 ) 01/02/23 01/09/23  Lorna Few, MD   amoxicillin-clavulanate (AUGMENTIN) 875-125 MG per tablet Take 1 tablet by mouth 2 times daily for 7 days  Patient taking differently: Take 1 tablet by mouth 2 times daily ( X 7 days.  Started on 7/27 ) 01/02/23 01/09/23  Lorna Few, MD   oxyCODONE (OXYCONTIN) 20 MG extended release tablet Take 1 tablet by mouth 2 times daily for 30 days. Intended supply: 30 days Max Daily Amount: 40 mg 12/26/22 01/25/23  Yvonne Kendall, APRN - NP   omeprazole (PRILOSEC) 40 MG delayed release capsule Take 1 capsule by mouth daily 12/26/22   Yvonne Kendall, APRN - NP   Handicap Placard MISC by Does not apply route 12/22/22   Yvonne Kendall, APRN - NP   Magic Mouthwash (MIRACLE MOUTHWASH) Swish and spit 5 mLs 4 times daily as needed for Irritation Diphenhyddramine: nystatin: lidocaine: maalox 11/17/22   Dollinger, Stefan Church, APRN - NP   simethicone (MYLICON) 80 MG chewable tablet Take 1 capsule by mouth 2 times daily    [provider]   Docusate Sodium 100 MG TABS Take 1 tablet by mouth 2 times daily    [provider]   ondansetron (ZOFRAN-ODT) 8 MG TBDP disintegrating tablet Take 1 tablet by mouth every 8 hours as needed for Nausea or Vomiting (nausea, vomiting) 11/06/22   Yvonne Kendall, APRN - NP   promethazine (PHENERGAN) 25 MG tablet Take 1 tablet by mouth every 6 hours as needed for Nausea  Patient taking differently: Take 1 tablet by mouth nightly 11/06/22   Yvonne Kendall, APRN - NP   Polyethylene Glycol 3350 (MIRALAX PO) Take 17 g by mouth 2 times daily 04/11/21   [provider]   Naproxen Sodium (ALEVE) 220 MG CAPS Take 1 tablet by mouth 2 times daily    [provider]        Imaging:  CT ABDOMEN PELVIS W IV CONTRAST Additional Contrast? None    Result Date: 01/06/2023  CT abdomen pelvis with contrast: 01/06/23 INDICATION: "recent admission for possible peritonitis, continued abd pain, tachycardia". COMPARISON: 12/29/2022 TECHNIQUE: Routine protocol (Axial portal venous phase imaging from the lung bases to the pubic symphysis following IV contrast with coronal reconstruction.)  CT scanning  was performed using radiation dose reduction techniques when appropriate, per system protocols. FINDINGS: Small bilateral pleural effusions-slight increase in size compared to recent prior. Numerous metastatic pulmonary nodules similar to prior. Widespread coalescing hepatic metastatic disease as before. Large lesion within the lateral  margin of the liver left and right hepatic lobes on image 30 spans 18.5 cm. Spleen, pancreas and adrenal glands stable in appearance. Kidneys nonobstructed.  No bowel obstruction. Large quantity of abdominal pelvic ascites increased compared to prior. Nonspecific peritoneal thickening most pronounced within the pelvis. Metastatic adenopathy upper abdomen, retroperitoneum-stable. Left periaortic lymph node measures 1.7 cm image 48. Portal vein remains patent. Abdominal aorta normal in caliber. Moderate diffuse anasarca. Lumbar disc degeneration and spondylosis, no aggressive bone lesions. Lower lumbar left pedicle fixation as before.     1. Slightly larger small bilateral pleural effusions. Increased now large quantity abdominal pelvic ascites. Peritoneal thickening again noted-given background extensive metastatic disease elsewhere this may represent carcinomatosis rather than infectious or inflammatory peritonitis. 2. Short term stability extensive pulmonary metastatic nodules and large coalescing hepatic metastatic lesions.  Abdominal/retroperitoneal adenopathy as before.     12/29/22    ECHO (TTE) COMPLETE (PRN CONTRAST/BUBBLE/STRAIN/3D) 01/01/2023  5:05 PM (Final)    Interpretation Summary    Left Ventricle: Low normal left ventricular systolic function with a visually estimated EF of 60 - 65%. Left ventricle is smaller than normal. Mild septal thickening. Normal wall motion. Technical limitations preclude assessment of global longitudinal strain.    Right Ventricle: Right ventricle is mildly dilated. Normal systolic function. TAPSE is normal. TDI systolic excursion is normal.    Image quality is technically difficult. Technically difficult study due to patient's body habitus, limited Doppler study due to patient's condition and technically difficult study due to low parasternal window.    This study was given to me to read today.    Signed by: Lyn Hollingshead, MD on 01/01/2023  5:05 PM       Lab Review   Lab Results   Component Value Date/Time    NA 128 01/08/2023 04:56 AM    K 4.6 01/08/2023 04:56 AM    CL 99 01/08/2023 04:56 AM    CO2 16 01/08/2023 04:56 AM    BUN 19 01/08/2023 04:56 AM    CREATININE 0.5 01/08/2023 04:56 AM    GLUCOSE 84 01/08/2023 04:56 AM    CALCIUM 7.1 01/08/2023 04:56 AM     Lab Results   Component Value Date/Time    WBC 15.6 01/08/2023 04:56 AM    HGB 8.3 01/08/2023 04:56 AM    HCT 29.2 01/08/2023 04:56 AM    MCV 87.4 01/08/2023 04:56 AM    PLT 164 01/08/2023 04:56 AM       Microbiology:   BLOOD CX #1  No results for input(s): "BC" in the last 72 hours.  BLOOD CX #2  No results for input(s): "BLOODCULT2" in the last 72 hours.   CULTURE, RESPIRATORY   No results found for: "CULTRESP"  Cultures : No results found for: "ORG"  No results found for: "WNDABS"  Blood culture   No results found for: "BC"

## 2023-01-08 NOTE — Progress Notes (Signed)
01/08/23 1059   Encounter Summary   Encounter Overview/Reason Family Care   Service Provided For Family   Referral/Consult From Rounding   Support System Family members;Palliative Care   Last Encounter  01/08/23   Spiritual/Emotional needs   Type Spiritual Support   Grief, Loss, and Adjustments   Type Anticipatory Grief   Palliative Care   Type Palliative Care, Family Care     Provided emotional support to pt's step-daughter (who is a Licensed conveyancer on 4 HVT) in the hallway when rounding on 5 south. Also facilitated reflection about her mother's coping and understanding of the pt's condition/prognosis. 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 She expressed hope for better symptom management for the pt at home.     I provided empathic listening, affirmed her emotions, provided companionship as she waited to enter the room. She was grateful for the support and would appreciate ongoing chaplain support. Chaplains remain available to offer further support and can be reached via Telemediq.      RH Pastoral Care [Mon-Friday  8 AM-4 PM]   and   AH_Pastoral Care/Chaplain (After hours and Weekends)     Chaplain Cameron Ali, MDiv, [facility]  Pastoral Care

## 2023-01-08 NOTE — Progress Notes (Signed)
Subjective  The patient is seen this morning during rounds.  His wife is bedside.  At the beginning of our conversation, he is alert, however, throughout our conversation he becomes drowsy and nods off.  He continues to endorse abdominal pain but feels that it is overall well-controlled.  He admits to his overall global decline.  He has no new complaints.      ONCOLOGY HISTORY  Metastatic gastric cancer, squamous cell differentiation-PD-L1 positive, HER2 positive  Originally presenting to the emergency department with a 20 pound weight loss, abdominal discomfort, and low grade fevers  CT abdomen/pelvis 10/25/22: There is a gastric mass at the gastric fundus measuring approximately 4.2 x 4.0 cm in size with multiple abnormal hepatic masses, multiple abnormally enlarged   lymph nodes, and multiple abnormal pulmonary nodules likely representing   extensive metastatic disease.  CTA chest 10/25/22: No evidence of acute pulmonary embolism as clinically queried. No pneumonia. No pleural effusion. No pneumothorax. Multiple bilateral pulmonary nodules with multiple hepatic lesions most likely representing extensive metastatic disease.  5/22  PET/CT 10/31/22: Intensely hypermetabolic gastric cardia mass with associated regional   hypermetabolic lymphadenopathy and more distant, fairly extensive pulmonary and   hepatic metastases.  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, squamous cell  10/31/22 IR Port placement  11/10/22 cycle 1 day 1 FOLFOX  11/24/22 added Nivolumab   12/16/22 CT abdomen/pelvis: Progression of neoplasm at all pre-existing sites, multiple coiling lesions in the lung bases, dominant left-sided lesion 3.3 cm previously 2.4 cm, extensive intrahepatic metastatic burden also increased, representative lesion 5 cm previously 4 cm, increased size of bulky low-density upper abdominal adenopathy including periaortic distribution,  prominent gastric cardia mass increased in prominence    Objective      Vitals & Measurements  VITALS:  BP 93/62   Pulse 92   Temp 97.6 F (36.4 C) (Oral)   Resp 18   Ht 1.753 m (5' 9.02")   Wt 81.7 kg (180 lb 1.6 oz)   SpO2 97%   BMI 26.58 kg/m     24HR INTAKE/OUTPUT:    Intake/Output Summary (Last 24 hours) at 01/08/2023 1610  Last data filed at 01/08/2023 9604  Gross per 24 hour   Intake --   Output 1650 ml   Net -1650 ml       Physical Exam  Constitutional: Acute on chronically ill appearing cachectic male sitting up in recliner  Eyes: Conjunctiva normal; eyelids normal; PERRLA; sclera anicteric  Ear, Nose, Throat: External ears and nose normal; slightly HOH; oropharynx unremarkable  Abdomen: distended  Skin: pallor present  Musculoskeletal: Generalized weakness   Neurologic: No focal motor or sensory deficit; alert and oriented x 3; gait not assessed; drowsy    Lab Results  Recent Results (from the past 24 hour(s))   Cell Count with Differential, Body Fluid    Collection Time: 01/07/23 10:39 AM   Result Value Ref Range    Body Fluid Type Ascites     APPEARANCE, BODY FLUID Slightly Hazy     Color, Body Fluid Yellow     Lymphocytes, Body Fluid 8 %    Macrophages 8 %    Monocytes, Body Fluid 8 %    Polymorphonuclear Cells Body Fluid 74 %    Tissue Cells, Body Fluid 2 %    Total Volume Received Body Fluid 1,000.0 mL    Total Nucleated cells Body Fluid 2,341 cells/mcL   Culture, Body Fluid  Gram Stain    Collection Time: 01/07/23 10:39 AM    Specimen: Ascitic Fluid; Ascites Fl    Abdominal ascites   Result Value Ref Range    Preliminary Result No growth after 18- 24 hours.     Gram Stain Result Few Polymorphonuclear WBC     Gram Stain Result No Bacteria Seen    Comprehensive Metabolic Panel    Collection Time: 01/08/23  4:56 AM   Result Value Ref Range    Sodium 128 (L) 135 - 145 mmol/L    Potassium 4.6 3.5 - 5.3 mmol/L    Chloride 99 98 - 107 mmol/L    CO2 16 (L) 22 - 29 mmol/L    Glucose 84 70 - 99 mg/dL     BUN 19 8 - 23 mg/dL    Creatinine 0.5 (L) 0.7 - 1.3 mg/dL    Anion Gap 13 2 - 17 mmol/L    Osmolaliy Calculated 258 (L) 270 - 287 mOsm/kg    Calcium 7.1 (L) 8.5 - 10.7 mg/dL    CALCIUM,CORRECTED,CCA 9.1 8.5 - 10.7 mg/dL    Total Protein 4.9 (L) 5.7 - 8.3 g/dL    Albumin 1.5 (L) 3.5 - 5.2 g/dL    Globulin 3.4 1.9 - 4.4 g/dL    Albumin/Globulin Ratio 0.40 (L) 1.00 - 2.70    Total Bilirubin 1.46 (H) 0.00 - 1.20 mg/dL    Alk Phosphatase 161 (H) 40 - 130 unit/L    AST 215 (H) 0 - 50 unit/L    ALT 33 0 - 50 unit/L    Est, Glom Filt Rate 115 >=60 mL/min/1.51m   Phosphorus    Collection Time: 01/08/23  4:56 AM   Result Value Ref Range    Phosphorus 2.6 2.5 - 4.5 mg/dL   Magnesium    Collection Time: 01/08/23  4:56 AM   Result Value Ref Range    Magnesium 2.5 1.6 - 2.6 mg/dL   CBC with Auto Differential    Collection Time: 01/08/23  4:56 AM   Result Value Ref Range    WBC 15.6 (H) 3.8 - 10.6 x10e3/mcL    RBC 3.34 (L) 4.00 - 5.60 x10e6/mcL    Hemoglobin 8.3 (L) 13.0 - 17.3 g/dL    Hematocrit 09.6 (L) 38.0 - 52.0 %    MCV 87.4 84.0 - 100.0 fL    MCH 24.9 (L) 27.0 - 34.5 pg    MCHC 28.4 (L) 30.0 - 36.0 g/dL    RDW 04.5 (H) 40.9 - 17.0 %    Platelets 164 140 - 440 x10e3/mcL    MPV 9.8 7.0 - 12.2 fL    NRBC Automated 0.0 0.0 - 0.2 %    NRBC Absolute 0.000 0.000 - 0.012 x10e3/mcL    Neutrophils % 76.7 (H) 42.0 - 74.0 %    Lymphocytes 10.4 (L) 15.0 - 45.0 %    Monocytes % 11.4 4.0 - 12.0 %    Eosinophils % 0.2 0.0 - 7.0 %    Basophils % 0.3 0.0 - 2.0 %    Neutrophils Absolute 12.0 (H) 1.6 - 7.3 x10e3/mcL    Lymphocytes Absolute 1.6 1.0 - 3.2 x10e3/mcL    Monocytes Absolute 1.8 (H) 0.3 - 1.0 x10e3/mcL    Eosinophils Absolute 0.0 0.0 - 0.5 x10e3/mcL    Basophils Absolute 0.0 0.0 - 0.2 x10e3/mcL    Immature Granulocytes % 1.0 (H) 0.0 - 0.6 %    Immature Grans (Abs) 0.16 (H) 0.00 - 0.06 x10e3/mcL  Assessment and Plan    Stage IV gastric squamous cell carcinoma  The case was reviewed extensively with the patient and his wife at  bedside.  Given his overall global decline, he is no longer eligible for systemic therapy.  It was discussed that further treatment would cause more harm than good.  At this time, we believe that it is important to focus on comfort measures.  We have reached out to the palliative care team who met with the patient yesterday.  We have recommended discharge home with hospice.    Stefan Church Dollinger, APRN - NP    Attestation:  I interviewed and examined the patient on rounds.   I formulated the assessment and plan as outlined above.  I discussed the plan of care with the patient and his wife at bedside. Palliative care team to see again today.     Evalyn Casco., M.D.   Elements of this note have been dictated using speech recognition software. As a result, errors of speech recognition may have occurred.

## 2023-01-09 LAB — MAGNESIUM: Magnesium: 2.4 mg/dL (ref 1.6–2.6)

## 2023-01-09 LAB — CBC WITH AUTO DIFFERENTIAL
Basophils %: 0.2 % (ref 0.0–2.0)
Basophils Absolute: 0 10*3/uL (ref 0.0–0.2)
Eosinophils %: 0.1 % (ref 0.0–7.0)
Eosinophils Absolute: 0 10*3/uL (ref 0.0–0.5)
Hematocrit: 27.7 % — ABNORMAL LOW (ref 38.0–52.0)
Hemoglobin: 7.9 g/dL — ABNORMAL LOW (ref 13.0–17.3)
Immature Grans (Abs): 0.18 10*3/uL — ABNORMAL HIGH (ref 0.00–0.06)
Immature Granulocytes %: 1.4 % — ABNORMAL HIGH (ref 0.0–0.6)
Lymphocytes Absolute: 1.1 10*3/uL (ref 1.0–3.2)
Lymphocytes: 8.2 % — ABNORMAL LOW (ref 15.0–45.0)
MCH: 24.5 pg — ABNORMAL LOW (ref 27.0–34.5)
MCHC: 28.5 g/dL — ABNORMAL LOW (ref 30.0–36.0)
MCV: 85.8 fL (ref 84.0–100.0)
MPV: 9.9 fL (ref 7.0–12.2)
Monocytes %: 7.9 % (ref 4.0–12.0)
Monocytes Absolute: 1 10*3/uL (ref 0.3–1.0)
NRBC Absolute: 0 10*3/uL (ref 0.000–0.012)
NRBC Automated: 0 % (ref 0.0–0.2)
Neutrophils %: 82.2 % — ABNORMAL HIGH (ref 42.0–74.0)
Neutrophils Absolute: 10.9 10*3/uL — ABNORMAL HIGH (ref 1.6–7.3)
Platelets: 174 10*3/uL (ref 140–440)
RBC: 3.23 x10e6/mcL — ABNORMAL LOW (ref 4.00–5.60)
RDW: 24.8 % — ABNORMAL HIGH (ref 10.0–17.0)
WBC: 13.2 10*3/uL — ABNORMAL HIGH (ref 3.8–10.6)

## 2023-01-09 LAB — COMPREHENSIVE METABOLIC PANEL
ALT: 43 U/L (ref 0–50)
AST: 259 U/L — ABNORMAL HIGH (ref 0–50)
Albumin/Globulin Ratio: 0.4 — ABNORMAL LOW (ref 1.00–2.70)
Albumin: 1.6 g/dL — ABNORMAL LOW (ref 3.5–5.2)
Alk Phosphatase: 505 U/L — ABNORMAL HIGH (ref 40–130)
Anion Gap: 15 mmol/L (ref 2–17)
BUN: 26 mg/dL — ABNORMAL HIGH (ref 8–23)
CALCIUM,CORRECTED,CCA: 8.9 mg/dL (ref 8.5–10.7)
CO2: 16 mmol/L — ABNORMAL LOW (ref 22–29)
Calcium: 7 mg/dL — ABNORMAL LOW (ref 8.5–10.7)
Chloride: 99 mmol/L (ref 98–107)
Creatinine: 0.7 mg/dL (ref 0.7–1.3)
Est, Glom Filt Rate: 104 mL/min/1.73m (ref >=60)
Globulin: 3.6 g/dL (ref 1.9–4.4)
Glucose: 108 mg/dL — ABNORMAL HIGH (ref 70–99)
Osmolaliy Calculated: 266 mOsm/kg — ABNORMAL LOW (ref 270–287)
Potassium: 4.7 mmol/L (ref 3.5–5.3)
Sodium: 130 mmol/L — ABNORMAL LOW (ref 135–145)
Total Bilirubin: 1.77 mg/dL — ABNORMAL HIGH (ref 0.00–1.20)
Total Protein: 5.2 g/dL — ABNORMAL LOW (ref 5.7–8.3)

## 2023-01-09 LAB — PHOSPHORUS: Phosphorus: 3.2 mg/dL (ref 2.5–4.5)

## 2023-01-09 MED FILL — FLUCONAZOLE IN SODIUM CHLORIDE 200-0.9 MG/100ML-% IV SOLN: INTRAVENOUS | Qty: 100

## 2023-01-09 MED FILL — NORMAL SALINE FLUSH 0.9 % IV SOLN: 0.9 % | INTRAVENOUS | Qty: 10

## 2023-01-09 MED FILL — NYSTATIN 100000 UNIT/ML MT SUSP: 100000 UNIT/ML | OROMUCOSAL | Qty: 5

## 2023-01-09 MED FILL — OXYCODONE-ACETAMINOPHEN 10-325 MG PO TABS: 10-325 MG | ORAL | Qty: 1

## 2023-01-09 MED FILL — OXYCONTIN 10 MG PO T12A: 10 MG | ORAL | Qty: 1

## 2023-01-09 MED FILL — NORMAL SALINE FLUSH 0.9 % IV SOLN: 0.9 % | INTRAVENOUS | Qty: 30

## 2023-01-09 MED FILL — PIPERACILLIN SOD-TAZOBACTAM SO 3.375 (3-0.375) G IV SOLR: 3.375 (3-0.375) g | INTRAVENOUS | Qty: 3375

## 2023-01-09 MED FILL — ENOXAPARIN SODIUM 40 MG/0.4ML IJ SOSY: 40 MG/0.4ML | INTRAMUSCULAR | Qty: 0.4

## 2023-01-09 MED FILL — PROTONIX 40 MG IV SOLR: 40 MG | INTRAVENOUS | Qty: 40

## 2023-01-09 MED FILL — SENOKOT 8.6 MG PO TABS: 8.6 MG | ORAL | Qty: 1

## 2023-01-09 MED FILL — NORMAL SALINE FLUSH 0.9 % IV SOLN: 0.9 % | INTRAVENOUS | Qty: 40

## 2023-01-09 NOTE — Discharge Summary (Signed)
[facility] Hospitalist Service    Discharge Summary     Patient ID:  Jesse Savage [id number] 61 y.o. Jul 28, 1960    Admit date: 01/06/2023  Discharge date and time: No discharge date for patient encounter.   Admitting Physician: Olivia Canter, MD   Discharge Physician: Lorna Few, MD     Discharge Diagnoses   Principal Problem:    Malignant neoplasm of body of stomach [facility])  Active Problems:    Severe malnutrition (HCC)    Sepsis (HCC)    Peritoneal carcinomatosis (HCC)    Cancer related pain    Lactic acid acidosis    Abdominal pain    Peritonitis (HCC)  Resolved Problems:    * No resolved hospital problems. [facility]     Jesse Savage is a 62 y.o. male with history of stage IV gastric cancer and peritoneal carcinomatosis, severe malnutrition, recently discharged last week, who represented to the ED due to profound weakness and worsening abdominal pain and little to no p.o. intake.  He was continued on antibiotics and underwent paracentesis with elevated cell count although culture negative.  Continued on antibiotics empirically.  Palliative care and oncology were consulted and after goals of care he decided to go home with hospice.  OxyContin increased to 30 mg twice daily with improvement in pain control.  He will continue on Percocet 10/325 for now..     Recent Labs: CBC:   Lab Results   Component Value Date/Time    WBC 13.2 01/09/2023 06:46 AM    RBC 3.23 01/09/2023 06:46 AM    HGB 7.9 01/09/2023 06:46 AM    HCT 27.7 01/09/2023 06:46 AM    MCV 85.8 01/09/2023 06:46 AM    MCH 24.5 01/09/2023 06:46 AM    MCHC 28.5 01/09/2023 06:46 AM    RDW 24.8 01/09/2023 06:46 AM    PLT 174 01/09/2023 06:46 AM     BMP:    Lab Results   Component Value Date/Time    GLUCOSE 108 01/09/2023 06:46 AM    NA 130 01/09/2023 06:46 AM    K 4.7 01/09/2023 06:46 AM    CL 99 01/09/2023 06:46 AM    CO2 16 01/09/2023 06:46 AM    ANIONGAP 15 01/09/2023 06:46 AM    BUN 26 01/09/2023 06:46 AM    CREATININE 0.7 01/09/2023  06:46 AM    CALCIUM 7.0 01/09/2023 06:46 AM    LABGLOM 104 01/09/2023 06:46 AM    LABGLOM 101 04/09/2021 12:58 PM    GFRAA 113 09/05/2019 09:24 AM       Radiology Last 7 Days:  US GUIDED PARACENTESIS    Result Date: 01/08/2023  Successful ultrasound guided paracentesis yielding 1000 ml's of peritoneal fluid  as detailed above.    CT ABDOMEN PELVIS W IV CONTRAST Additional Contrast? None    Result Date: 01/06/2023  1. Slightly larger small bilateral pleural effusions. Increased now large quantity abdominal pelvic ascites. Peritoneal thickening again noted-given background extensive metastatic disease elsewhere this may represent carcinomatosis rather than infectious or inflammatory peritonitis. 2. Short term stability extensive pulmonary metastatic nodules and large coalescing hepatic metastatic lesions. Abdominal/retroperitoneal adenopathy as before.      Discharge Exam:  Blood pressure (!) 89/64, pulse 89, temperature 97.7 F (36.5 C), temperature source Oral, resp. rate 18, height 1.753 m (5' 9.02"), weight 82.1 kg (181 lb), SpO2 97 %.   Body mass index is 26.72 kg/m.    GEN:  Acutel and chronically y ill.  RESP:  CTAB.  No w/r/r.  No increased work of breathing.  CV: RRR no m/r/g.  No edema.    GI: Firm and distended and less tender.  Bowel sounds present.  MSK: Diffuse muscle wasting.  NEURO/PSYCH: Somnolent at times, oriented x 3.  Non-focal.  Calm and cooperative.   SKIN:  no rashes, no lesions, no jaundice.    Discharge Plan   Disposition: Home with Hospice Services    Provider Follow-Up:   [facility] [place]  [address] [place] [address] [place] [address]  [phone]           In process/preliminary results:  Outstanding Order Results       Date and Time Order Name Status Description    01/07/2023 10:39 AM Culture, Body Fluid Gram Stain Preliminary     01/06/2023  6:44 PM Blood Culture 1 Preliminary     12/30/2022  5:50 PM Lactic Acid In process     12/30/2022 12:22 PM  ANTIBODY SCREEN In process     12/30/2022 12:22 PM ABO/RH In process     12/30/2022 12:22 PM TYPE AND SCREEN In process     12/30/2022  7:37 AM Add On Lab Test In process     12/30/2022  7:37 AM Add On Lab Test In process     12/29/2022  6:24 PM Add On Lab Test In process             Patient Instructions   Diet: soft diet   Activity: activity as tolerated    Discharge Medications         Medication List        START taking these medications      ciprofloxacin 500 MG tablet  Commonly known as: CIPRO  Take 1 tablet by mouth 2 times daily for 7 days     metroNIDAZOLE 500 MG tablet  Commonly known as: FLAGYL  Take 1 tablet by mouth 2 times daily for 7 days            CHANGE how you take these medications      oxyCODONE 30 MG T12a extended release tablet  Commonly known as: OxyCONTIN  Take 30 mg by mouth 2 times daily for 7 days. Max Daily Amount: 60 mg  What changed:   medication strength  how much to take  additional instructions     oxyCODONE-acetaminophen 10-325 MG per tablet  Commonly known as: Percocet  Take 1 tablet by mouth every 4 hours as needed for Pain for up to 30 doses. Max Daily Amount: 6 tablets  What changed:   when to take this  additional instructions            CONTINUE taking these medications      Aleve 220 MG Caps  Generic drug: Naproxen Sodium     Docusate Sodium 100 MG Tabs     Handicap Placard Misc  by Does not apply route     Magic Mouthwash  Commonly known as: Miracle Mouthwash  Swish and spit 5 mLs 4 times daily as needed for Irritation Diphenhyddramine: nystatin: lidocaine: maalox     MIRALAX PO     omeprazole 40 MG delayed release capsule  Commonly known as: PRILOSEC  Take 1 capsule by mouth daily     ondansetron 8 MG Tbdp disintegrating tablet  Commonly known as: ZOFRAN-ODT  Take 1 tablet by mouth every 8 hours as  needed for Nausea or Vomiting (nausea, vomiting)     promethazine 25 MG tablet  Commonly known as: PHENERGAN  Take 1 tablet by mouth every 6 hours as needed for Nausea     simethicone  80 MG chewable tablet  Commonly known as: MYLICON            STOP taking these medications      amoxicillin-clavulanate 875-125 MG per tablet  Commonly known as: AUGMENTIN     doxycycline hyclate 100 MG tablet  Commonly known as: VIBRA-TABS               Where to Get Your Medications        These medications were sent to [facility] - [place], [place] - [address] - [place] [phone] - F [phone]  [address] [place] [address]      Phone: [phone]   ciprofloxacin 500 MG tablet  metroNIDAZOLE 500 MG tablet  oxyCODONE 30 MG T12a extended release tablet  oxyCODONE-acetaminophen 10-325 MG per tablet         Discharge Medications  (summarized)     Current Outpatient Medications   Medication Instructions    ciprofloxacin (CIPRO) 500 mg, Oral, 2 TIMES DAILY    Docusate Sodium 100 MG TABS 1 tablet, Oral, 2 TIMES DAILY    Handicap Placard MISC Does not apply    Magic Mouthwash (MIRACLE MOUTHWASH) 5 mLs, Swish & Spit, 4 TIMES DAILY PRN, Diphenhyddramine: nystatin: lidocaine: maalox    metroNIDAZOLE (FLAGYL) 500 mg, Oral, 2 TIMES DAILY    Naproxen Sodium (ALEVE) 220 MG CAPS 1 tablet, Oral, 2 TIMES DAILY    omeprazole (PRILOSEC) 40 mg, Oral, DAILY    ondansetron (ZOFRAN-ODT) 8 mg, Oral, EVERY 8 HOURS PRN    oxyCODONE (OXYCONTIN) 30 mg, Oral, 2 TIMES DAILY    oxyCODONE-acetaminophen (PERCOCET) 10-325 MG per tablet 1 tablet, Oral, EVERY 4 HOURS PRN    Polyethylene Glycol 3350 (MIRALAX PO) 17 g, Oral, 2 TIMES DAILY    promethazine (PHENERGAN) 25 mg, Oral, EVERY 6 HOURS PRN    simethicone (MYLICON) 80 MG chewable tablet 1 capsule, Oral, 2 TIMES DAILY       Time Spent on Discharge:  55 minutes were spent on preparing this discharge.  Greater than 50% of this time was spent on direct patient care, or coordination of that care.         Electronically signed by Lorna Few, MD on 01/09/23 at 8:54 AM EDT

## 2023-01-09 NOTE — Care Coordination-Inpatient (Signed)
01/09/23 1447   Discharge Planning   Type of Residence House   Living Arrangements Spouse/Significant Other   Current Services Prior To Admission None   DME Ordered? Other (comment)  (per [facility])   Type of Home Care Services Hospice  (Family chose [facility])   One/Two Story Residence One story   History of falls? 0   Services At/After Discharge   Transition of Care Consult (CM Consult) Hospice   Internal Home Health Yes  ([facility])     01/09/23:   BMP  CM D/C Plan:   Discharge to home today with family choice:  [facility].  Per spouse:  D/C IM verbally signed/DME delivered to pt's home/spouse picked up all d/c meds at [facility], prior to pt d/c from [place] today.  CM called [facility], confirmed that meds were filled, ready for pick-up.  Spouse and pt cancelled [facility] ambulance transportation/prefer spouse to transport pt from [place] [facility] Home.  [facility] staff assisted pt to get dressed, provided w/c to front of [place] with belongings to spouse's car.  Staff RN aware CM d/c plan is complete.

## 2023-01-30 ENCOUNTER — Telehealth: Payer: Self-pay | Admitting: Cardiovascular Disease

## 2023-01-30 NOTE — Telephone Encounter (Signed)
Patient stated that the he has moved to [place] and is seeing the [place] there. They want to change his Metoprolol Tartrate to Succinate due to balance concerns. They feel like the Succinate will work better since it is extended release.  The patient would like Dr. Windell Hummingbird opinion on this. He also stated that he will have to look for a cardiologist in [place] since he has moved there.

## 2023-01-30 NOTE — Telephone Encounter (Signed)
Pt c/o medication issue:  1. Name of Medication: metoprolol tartrate (LOPRESSOR) 25 MG tablet   2. How are you currently taking this medication (dosage and times per day)?    3. Are you having a reaction (difficulty breathing--STAT)? no  4. What is your medication issue? [facility]. Calling to see if that would be okay with dr. Mariah Milling. Please advise

## 2023-02-03 NOTE — Telephone Encounter (Signed)
Called patient- advised of note below from MD.  Patient verbalized understanding, he states he would rather not switch medication at all, so he will notify them of that, and will continue to check BP at home.

## 2023-02-03 NOTE — Telephone Encounter (Signed)
Called patient, LVM to return call.  Left call back number.

## 2023-02-03 NOTE — Telephone Encounter (Signed)
Called patient and left message for call back.

## 2023-02-03 NOTE — Telephone Encounter (Signed)
Patient is returning call.  °

## 2023-02-03 NOTE — Telephone Encounter (Signed)
Patient is returning call. Transferred to to [place], LPN.

## 2023-02-08 DEATH — deceased
